# Patient Record
Sex: Male | Born: 1955 | Race: White | Hispanic: No | Smoking: Former smoker
Health system: Southern US, Community
[De-identification: ages and names within clinical notes are randomized; demographics above are authoritative.]

## PROBLEM LIST (undated history)

## (undated) DIAGNOSIS — R809 Proteinuria, unspecified: Secondary | ICD-10-CM

## (undated) DIAGNOSIS — M1990 Unspecified osteoarthritis, unspecified site: Secondary | ICD-10-CM

## (undated) DIAGNOSIS — I4891 Unspecified atrial fibrillation: Secondary | ICD-10-CM

## (undated) DIAGNOSIS — L0291 Cutaneous abscess, unspecified: Secondary | ICD-10-CM

## (undated) DIAGNOSIS — G4730 Sleep apnea, unspecified: Secondary | ICD-10-CM

## (undated) DIAGNOSIS — I10 Essential (primary) hypertension: Secondary | ICD-10-CM

## (undated) HISTORY — PX: LAPAROTOMY: SHX154

## (undated) HISTORY — PX: COLONOSCOPY: SHX174

## (undated) HISTORY — PX: IR PATIENT EVAL TECH 0-60 MINS: IMG5564

## (undated) HISTORY — PX: REPLACEMENT TOTAL HIP W/  RESURFACING IMPLANTS: SUR1222

## (undated) HISTORY — DX: Essential (primary) hypertension: I10

## (undated) HISTORY — PX: CYSTOSCOPY WITH STENT PLACEMENT: SHX5790

## (undated) HISTORY — DX: Sleep apnea, unspecified: G47.30

## (undated) HISTORY — DX: Proteinuria, unspecified: R80.9

## (undated) HISTORY — DX: Unspecified atrial fibrillation: I48.91

## (undated) HISTORY — DX: Cutaneous abscess, unspecified: L02.91

## (undated) HISTORY — DX: Unspecified osteoarthritis, unspecified site: M19.90

---

## 1997-09-13 ENCOUNTER — Ambulatory Visit (HOSPITAL_COMMUNITY): Admission: RE | Admit: 1997-09-13 | Discharge: 1997-09-13 | Payer: Self-pay | Admitting: *Deleted

## 2001-02-09 ENCOUNTER — Encounter: Admission: RE | Admit: 2001-02-09 | Discharge: 2001-02-09 | Payer: Self-pay | Admitting: Nephrology

## 2001-02-09 ENCOUNTER — Encounter: Payer: Self-pay | Admitting: Nephrology

## 2003-10-19 ENCOUNTER — Ambulatory Visit (HOSPITAL_BASED_OUTPATIENT_CLINIC_OR_DEPARTMENT_OTHER): Admission: RE | Admit: 2003-10-19 | Discharge: 2003-10-19 | Payer: Self-pay | Admitting: Family Medicine

## 2003-12-31 ENCOUNTER — Encounter: Admission: RE | Admit: 2003-12-31 | Discharge: 2003-12-31 | Payer: Self-pay | Admitting: Family Medicine

## 2004-08-09 ENCOUNTER — Emergency Department (HOSPITAL_COMMUNITY): Admission: EM | Admit: 2004-08-09 | Discharge: 2004-08-09 | Payer: Self-pay | Admitting: Emergency Medicine

## 2007-08-28 ENCOUNTER — Encounter: Admission: RE | Admit: 2007-08-28 | Discharge: 2007-08-28 | Payer: Self-pay | Admitting: Family Medicine

## 2011-02-19 ENCOUNTER — Emergency Department (HOSPITAL_COMMUNITY)
Admission: EM | Admit: 2011-02-19 | Discharge: 2011-02-20 | Disposition: A | Discharge status: Home / Self Care | Payer: BC Managed Care – PPO | Attending: Emergency Medicine | Admitting: Emergency Medicine

## 2011-02-19 DIAGNOSIS — W2203XA Walked into furniture, initial encounter: Secondary | ICD-10-CM | POA: Insufficient documentation

## 2011-02-19 DIAGNOSIS — S70259A Superficial foreign body, unspecified hip, initial encounter: Secondary | ICD-10-CM | POA: Insufficient documentation

## 2011-03-02 ENCOUNTER — Encounter: Payer: Self-pay | Admitting: Internal Medicine

## 2011-03-21 ENCOUNTER — Ambulatory Visit (AMBULATORY_SURGERY_CENTER): Payer: BC Managed Care – PPO

## 2011-03-21 ENCOUNTER — Encounter: Payer: Self-pay | Admitting: Internal Medicine

## 2011-03-21 VITALS — Ht 66.0 in | Wt 259.0 lb

## 2011-03-21 DIAGNOSIS — Z1211 Encounter for screening for malignant neoplasm of colon: Secondary | ICD-10-CM

## 2011-03-21 MED ORDER — PEG-KCL-NACL-NASULF-NA ASC-C 100 G PO SOLR: 1.0000 | Freq: Once | ORAL | Status: DC

## 2011-04-04 ENCOUNTER — Ambulatory Visit (AMBULATORY_SURGERY_CENTER): Payer: BC Managed Care – PPO | Admitting: Internal Medicine

## 2011-04-04 ENCOUNTER — Encounter: Payer: Self-pay | Admitting: Internal Medicine

## 2011-04-04 VITALS — BP 130/75 | HR 60 | Temp 97.8°F | Resp 16 | Ht 66.0 in | Wt 259.0 lb

## 2011-04-04 DIAGNOSIS — Z1211 Encounter for screening for malignant neoplasm of colon: Secondary | ICD-10-CM

## 2011-04-04 DIAGNOSIS — D126 Benign neoplasm of colon, unspecified: Secondary | ICD-10-CM

## 2011-04-04 LAB — HM COLONOSCOPY

## 2011-04-04 MED ORDER — SODIUM CHLORIDE 0.9 % IV SOLN: 500.0000 mL | INTRAVENOUS | Status: DC

## 2011-04-04 NOTE — Progress Notes (Signed)
Pressure applied to abdomen to reach cecum.  

## 2011-04-04 NOTE — Patient Instructions (Signed)
Please review discharge instructions (blue and green sheets)  Await pathology  Resume normal medications  Please review information about polyps, hemorrhoids, and high fiber diets

## 2011-04-05 ENCOUNTER — Telehealth: Payer: Self-pay | Admitting: *Deleted

## 2011-04-05 NOTE — Telephone Encounter (Signed)
Home number provided is a number for a Mental Health Provider.

## 2011-04-11 ENCOUNTER — Encounter: Payer: Self-pay | Admitting: *Deleted

## 2014-01-23 ENCOUNTER — Encounter: Payer: Self-pay | Admitting: Internal Medicine

## 2014-08-06 ENCOUNTER — Encounter: Payer: Self-pay | Admitting: Internal Medicine

## 2016-09-08 ENCOUNTER — Other Ambulatory Visit (HOSPITAL_COMMUNITY): Payer: Self-pay | Admitting: Nephrology

## 2016-09-08 DIAGNOSIS — N049 Nephrotic syndrome with unspecified morphologic changes: Secondary | ICD-10-CM

## 2016-09-18 ENCOUNTER — Other Ambulatory Visit: Payer: Self-pay | Admitting: Radiology

## 2016-09-19 ENCOUNTER — Other Ambulatory Visit: Payer: Self-pay | Admitting: Radiology

## 2016-09-20 ENCOUNTER — Ambulatory Visit (HOSPITAL_COMMUNITY)
Admission: RE | Admit: 2016-09-20 | Discharge: 2016-09-20 | Disposition: A | Discharge status: Home / Self Care | Payer: 59 | Source: Ambulatory Visit | Attending: Nephrology | Admitting: Nephrology

## 2016-09-20 ENCOUNTER — Ambulatory Visit (HOSPITAL_COMMUNITY): Payer: Self-pay

## 2016-09-20 ENCOUNTER — Encounter (HOSPITAL_COMMUNITY): Payer: Self-pay

## 2016-09-20 DIAGNOSIS — I129 Hypertensive chronic kidney disease with stage 1 through stage 4 chronic kidney disease, or unspecified chronic kidney disease: Secondary | ICD-10-CM | POA: Diagnosis not present

## 2016-09-20 DIAGNOSIS — N182 Chronic kidney disease, stage 2 (mild): Secondary | ICD-10-CM | POA: Diagnosis not present

## 2016-09-20 DIAGNOSIS — N049 Nephrotic syndrome with unspecified morphologic changes: Secondary | ICD-10-CM

## 2016-09-20 DIAGNOSIS — F17210 Nicotine dependence, cigarettes, uncomplicated: Secondary | ICD-10-CM | POA: Diagnosis not present

## 2016-09-20 DIAGNOSIS — M1990 Unspecified osteoarthritis, unspecified site: Secondary | ICD-10-CM | POA: Diagnosis not present

## 2016-09-20 DIAGNOSIS — R809 Proteinuria, unspecified: Secondary | ICD-10-CM | POA: Diagnosis present

## 2016-09-20 LAB — BASIC METABOLIC PANEL
ANION GAP: 10 (ref 5–15)
BUN: 9 mg/dL (ref 6–20)
CHLORIDE: 103 mmol/L (ref 101–111)
CO2: 23 mmol/L (ref 22–32)
Calcium: 9.6 mg/dL (ref 8.9–10.3)
Creatinine, Ser: 1.08 mg/dL (ref 0.61–1.24)
GFR calc non Af Amer: >60 mL/min (ref >60)
GLUCOSE: 97 mg/dL (ref 65–99)
Potassium: 4 mmol/L (ref 3.5–5.1)
Sodium: 136 mmol/L (ref 135–145)

## 2016-09-20 LAB — CBC WITH DIFFERENTIAL/PLATELET
Basophils Absolute: 0 10*3/uL (ref 0.0–0.1)
Basophils Relative: 1 %
Eosinophils Absolute: 0.7 10*3/uL (ref 0.0–0.7)
Eosinophils Relative: 9 %
HEMATOCRIT: 47.7 % (ref 39.0–52.0)
HEMOGLOBIN: 16.3 g/dL (ref 13.0–17.0)
LYMPHS ABS: 2.3 10*3/uL (ref 0.7–4.0)
Lymphocytes Relative: 29 %
MCH: 32.8 pg (ref 26.0–34.0)
MCHC: 34.2 g/dL (ref 30.0–36.0)
MCV: 96 fL (ref 78.0–100.0)
MONO ABS: 0.6 10*3/uL (ref 0.1–1.0)
MONOS PCT: 8 %
NEUTROS PCT: 53 %
Neutro Abs: 4 10*3/uL (ref 1.7–7.7)
Platelets: 178 10*3/uL (ref 150–400)
RBC: 4.97 MIL/uL (ref 4.22–5.81)
RDW: 12.7 % (ref 11.5–15.5)
WBC: 7.7 10*3/uL (ref 4.0–10.5)

## 2016-09-20 LAB — PROTIME-INR
INR: 1.08
Prothrombin Time: 14 seconds (ref 11.4–15.2)

## 2016-09-20 MED ORDER — FENTANYL CITRATE (PF) 100 MCG/2ML IJ SOLN
INTRAMUSCULAR | Status: AC
  Filled 2016-09-20: qty 2

## 2016-09-20 MED ORDER — LIDOCAINE HCL 1 % IJ SOLN
INTRAMUSCULAR | Status: AC
  Filled 2016-09-20: qty 20

## 2016-09-20 MED ORDER — GELATIN ABSORBABLE 12-7 MM EX MISC
CUTANEOUS | Status: AC
  Filled 2016-09-20: qty 1

## 2016-09-20 MED ORDER — FENTANYL CITRATE (PF) 100 MCG/2ML IJ SOLN
INTRAMUSCULAR | Status: AC | PRN
  Administered 2016-09-20 (×2): 50 ug via INTRAVENOUS

## 2016-09-20 MED ORDER — MIDAZOLAM HCL 2 MG/2ML IJ SOLN
INTRAMUSCULAR | Status: AC
  Filled 2016-09-20: qty 2

## 2016-09-20 MED ORDER — MIDAZOLAM HCL 2 MG/2ML IJ SOLN
INTRAMUSCULAR | Status: AC | PRN
  Administered 2016-09-20 (×2): 1 mg via INTRAVENOUS

## 2016-09-20 MED ORDER — SODIUM CHLORIDE 0.9 % IV SOLN
INTRAVENOUS | Status: DC
  Administered 2016-09-20: 12:00:00 via INTRAVENOUS

## 2016-09-20 NOTE — Procedures (Signed)
Radnom renal Bx 16 g core times two EBL 0 Comp 0

## 2016-09-20 NOTE — Discharge Instructions (Signed)
Percutaneous Kidney Biopsy, Care After °This sheet gives you information about how to care for yourself after your procedure. Your health care provider may also give you more specific instructions. If you have problems or questions, contact your health care provider. °What can I expect after the procedure? °After the procedure, it is common to have: °· Pain or soreness near the area where the needle went through your skin (biopsy site). °· Bright pink or cloudy urine for 24 hours after the procedure. °Follow these instructions at home: °Activity  °· Return to your normal activities as told by your health care provider. Ask your health care provider what activities are safe for you. °· Do not drive for 24 hours if you were given a medicine to help you relax (sedative). °· Do not lift anything that is heavier than 10 lb (4.5 kg) until your health care provider tells you that it is safe. °· Avoid activities that take a lot of effort (are strenuous) until your health care provider approves. Most people will have to wait 2 weeks before returning to activities such as exercise or sexual intercourse. °General instructions  °· Take over-the-counter and prescription medicines only as told by your health care provider. °· You may eat and drink after your procedure. Follow instructions from your health care provider about eating or drinking restrictions. °· Check your biopsy site every day for signs of infection. Check for: °¨ More redness, swelling, or pain. °¨ More fluid or blood. °¨ Warmth. °¨ Pus or a bad smell. °· Keep all follow-up visits as told by your health care provider. This is important. °Contact a health care provider if: °· You have more redness, swelling, or pain around your biopsy site. °· You have more fluid or blood coming from your biopsy site. °· Your biopsy site feels warm to the touch. °· You have pus or a bad smell coming from your biopsy site. °· You have blood in your urine more than 24 hours after  your procedure. °Get help right away if: °· You have dark red or brown urine. °· You have a fever. °· You are unable to urinate. °· You feel burning when you urinate. °· You feel faint. °· You have severe pain in your abdomen or side. °This information is not intended to replace advice given to you by your health care provider. Make sure you discuss any questions you have with your health care provider. °Document Released: 01/23/2013 Document Revised: 03/04/2016 Document Reviewed: 03/04/2016 °Elsevier Interactive Patient Education © 2017 [employer] ° °

## 2016-09-20 NOTE — H&P (Signed)
Chief Complaint: proteinuria  Referring Physician:Dr. Donato Larson  Supervising Physician: Robert Larson  Patient Status: [facility] - Out-pt  HPI: Robert Larson is an 61 y.o. male with a long-standing history of CKD Stage 2.  He is followed by Dr. Marval Larson.  He has had protein in his urine for a while, but recently has increased.  A random renal biopsy has now been requested.  He presents today for this procedures with no other medical complaints.  He took his BP meds this morning.    Past Medical History:  Past Medical History:  Diagnosis Date  . Osteoarthritis   . Protein in urine     Past Surgical History:  Past Surgical History:  Procedure Laterality Date  . 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 Colon cancer Neg Hx     Social History:  reports that he has been smoking Cigarettes.  He has been smoking about 1.00 pack per day. He does not have any smokeless tobacco history on file. His alcohol and drug histories are not on file.  Allergies: No Known Allergies  Medications: Medications reviewed in epic  Please HPI for pertinent positives, otherwise complete 10 system ROS negative.  Mallampati Score: MD Evaluation Airway: WNL Heart: WNL Abdomen: WNL Chest/ Lungs: WNL ASA  Classification: 2 Mallampati/Airway Score: Two  Physical Exam: BP 129/86   Pulse 64   Temp 97.9 F (36.6 C)   Resp 18   Ht 5\' 7"  (1.702 m)   Wt 259 lb (117.5 kg)   SpO2 98%   BMI 40.57 kg/m  Body mass index is 40.57 kg/m. General: pleasant, obese white male who is laying in bed in NAD HEENT: head is normocephalic, atraumatic.  Sclera are noninjected.  PERRL.  Ears and nose without any masses or lesions.  Mouth is pink and moist Heart: regular, rate, and rhythm.  Normal s1,s2. No obvious murmurs, gallops, or rubs noted.  Palpable radial and pedal pulses bilaterally Lungs: CTAB, no wheezes,  rhonchi, or rales noted.  Respiratory effort nonlabored Abd: soft, NT, obese, +BS, no masses, hernias, or organomegaly Psych: A&Ox3 with an appropriate affect.   Labs: Results for orders placed or performed during the hospital encounter of 09/20/16 (from the past 48 hour(s))  CBC with Differential/Platelet     Status: None   Collection Time: 09/20/16 11:54 AM  Result Value Ref Range   WBC 7.7 4.0 - 10.5 K/uL   RBC 4.97 4.22 - 5.81 MIL/uL   Hemoglobin 16.3 13.0 - 17.0 g/dL   HCT 47.7 39.0 - 52.0 %   MCV 96.0 78.0 - 100.0 fL   MCH 32.8 26.0 - 34.0 pg   MCHC 34.2 30.0 - 36.0 g/dL   RDW 12.7 11.5 - 15.5 %   Platelets 178 150 - 400 K/uL   Neutrophils Relative % 53 %   Neutro Abs 4.0 1.7 - 7.7 K/uL   Lymphocytes Relative 29 %   Lymphs Abs 2.3 0.7 - 4.0 K/uL   Monocytes Relative 8 %   Monocytes Absolute 0.6 0.1 - 1.0 K/uL   Eosinophils Relative 9 %   Eosinophils Absolute 0.7 0.0 - 0.7 K/uL   Basophils Relative 1 %   Basophils Absolute 0.0 0.0 - 0.1 K/uL    Imaging: No results found.  Assessment/Plan 1. Proteinuria, CKD Stage 2 We will plan to proceed with a random  renal biopsy.  His first BP was elevated, but his cuff was too small.  Once an appropriate sized cuff was placed his BP was noted to be 129/86.   Labs have been reviewed -Risks and Benefits discussed with the patient including, but not limited to bleeding, infection, damage to adjacent structures or low yield requiring additional tests. All of the patient's questions were answered, patient is agreeable to proceed. Consent signed and in chart.  Thank you for this interesting consult.  I greatly enjoyed meeting Robert Larson and look forward to participating in their care.  A copy of this report was sent to the requesting provider on this date.  Electronically Signed: Henreitta Larson 09/20/2016, 12:27 PM   I spent a total of  30 Minutes   in face to face in clinical consultation, greater than 50% of which was  counseling/coordinating care for proteinuria

## 2016-09-29 ENCOUNTER — Encounter (HOSPITAL_COMMUNITY): Payer: Self-pay

## 2016-10-04 ENCOUNTER — Encounter (HOSPITAL_COMMUNITY): Payer: Self-pay

## 2016-11-21 ENCOUNTER — Ambulatory Visit (INDEPENDENT_AMBULATORY_CARE_PROVIDER_SITE_OTHER): Payer: 59 | Admitting: Otolaryngology

## 2017-01-27 ENCOUNTER — Emergency Department (HOSPITAL_COMMUNITY): Payer: 59

## 2017-01-27 ENCOUNTER — Encounter (HOSPITAL_COMMUNITY): Payer: Self-pay | Admitting: Emergency Medicine

## 2017-01-27 ENCOUNTER — Emergency Department (HOSPITAL_COMMUNITY)
Admission: EM | Admit: 2017-01-27 | Discharge: 2017-01-27 | Disposition: A | Discharge status: Home / Self Care | Payer: 59 | Attending: Emergency Medicine | Admitting: Emergency Medicine

## 2017-01-27 DIAGNOSIS — Z7982 Long term (current) use of aspirin: Secondary | ICD-10-CM | POA: Diagnosis not present

## 2017-01-27 DIAGNOSIS — F17210 Nicotine dependence, cigarettes, uncomplicated: Secondary | ICD-10-CM | POA: Insufficient documentation

## 2017-01-27 DIAGNOSIS — Z96642 Presence of left artificial hip joint: Secondary | ICD-10-CM | POA: Diagnosis not present

## 2017-01-27 DIAGNOSIS — M25552 Pain in left hip: Secondary | ICD-10-CM | POA: Diagnosis not present

## 2017-01-27 MED ORDER — DIAZEPAM 5 MG PO TABS
5.0000 mg | ORAL_TABLET | Freq: Once | ORAL | Status: AC
  Administered 2017-01-27: 5 mg via ORAL
  Filled 2017-01-27: qty 1

## 2017-01-27 MED ORDER — METHOCARBAMOL 750 MG PO TABS: 750.0000 mg | ORAL_TABLET | Freq: Four times a day (QID) | ORAL | 0 refills | Status: DC

## 2017-01-27 MED ORDER — OXYCODONE-ACETAMINOPHEN 5-325 MG PO TABS: 2.0000 | ORAL_TABLET | ORAL | 0 refills | Status: DC | PRN

## 2017-01-27 MED ORDER — OXYCODONE-ACETAMINOPHEN 5-325 MG PO TABS
2.0000 | ORAL_TABLET | Freq: Once | ORAL | Status: AC
  Administered 2017-01-27: 2 via ORAL
  Filled 2017-01-27: qty 2

## 2017-01-27 NOTE — ED Provider Notes (Signed)
[facility] DEPT Provider Note   CSN: [id number] Arrival date & time: 01/27/17  0913     History   Chief Complaint Chief Complaint  Patient presents with  . Hip Pain  . Leg Pain    left    HPI Robert Larson is a 61 y.o. male.  61 year old male presents withseveral-day history of left lateral hip pain that began after he struck it against an object. Does have a history of left hip replacement. Denies any weakness to his left foot. No back pain. Pain is sharp and worse with movements and better with remaining still. No treatment use prior to arrival.      Past Medical History:  Diagnosis Date  . Osteoarthritis   . Protein in urine     There are no active problems to display for this patient.   Past Surgical History:  Procedure Laterality Date  . REPLACEMENT TOTAL HIP W/  RESURFACING IMPLANTS     Left        Home Medications    Prior to Admission medications   Medication Sig Start Date End Date Taking? Authorizing Provider  aspirin 81 MG tablet Take 81 mg by mouth daily.      [provider]  telmisartan (MICARDIS) 80 MG tablet Take 80 mg by mouth daily.      [provider]  verapamil (CALAN) 120 MG tablet Take 120 mg by mouth daily.    [provider]    Family History Family History  Problem Relation Age of Onset  . Colon cancer Neg Hx     Social History Social History  Substance Use Topics  . Smoking status: Current Every Day Smoker    Packs/day: 1.00    Types: Cigarettes  . Smokeless tobacco: Never Used  . Alcohol use No     Allergies   Patient has no known allergies.   Review of Systems Review of Systems  All other systems reviewed and are negative.    Physical Exam Updated Vital Signs BP (!) 170/88 (BP Location: Left Arm)   Pulse (!) 59   Temp 98.2 F (36.8 C) (Oral)   Resp 19   Ht 1.702 m (5\' 7" )   Wt 113.4 kg (250 lb)   SpO2 96%   BMI 39.16 kg/m   Physical Exam  Constitutional: He is  oriented to person, place, and time. He appears well-developed and well-nourished.  Non-toxic appearance. No distress.  HENT:  Head: Normocephalic and atraumatic.  Eyes: Pupils are equal, round, and reactive to light. Conjunctivae, EOM and lids are normal.  Neck: Normal range of motion. Neck supple. No tracheal deviation present. No thyroid mass present.  Cardiovascular: Normal rate, regular rhythm and normal heart sounds.  Exam reveals no gallop.   No murmur heard. Pulmonary/Chest: Effort normal and breath sounds normal. No stridor. No respiratory distress. He has no decreased breath sounds. He has no wheezes. He has no rhonchi. He has no rales.  Abdominal: Soft. Normal appearance and bowel sounds are normal. He exhibits no distension. There is no tenderness. There is no rebound and no CVA tenderness.  Musculoskeletal: Normal range of motion. He exhibits no edema or tenderness.       Legs: Neurological: He is alert and oriented to person, place, and time. He has normal strength. No cranial nerve deficit or sensory deficit. GCS eye subscore is 4. GCS verbal subscore is 5. GCS motor subscore is 6.  Skin: Skin is warm and dry. No abrasion and  no rash noted.  Psychiatric: He has a normal mood and affect. His speech is normal and behavior is normal.  Nursing note and vitals reviewed.    ED Treatments / Results  Labs (all labs ordered are listed, but only abnormal results are displayed) Labs Reviewed - No data to display  EKG  EKG Interpretation None       Radiology No results found.  Procedures Procedures (including critical care time)  Medications Ordered in ED Medications  oxyCODONE-acetaminophen (PERCOCET/ROXICET) 5-325 MG per tablet 2 tablet (not administered)  diazepam (VALIUM) tablet 5 mg (not administered)     Initial Impression / Assessment and Plan / ED Course  I have reviewed the triage vital signs and the nursing notes.  Pertinent labs & imaging results that were  available during my care of the patient were reviewed by me and considered in my medical decision making (see chart for details).     Patient's pain treated here feels better. X-rays negative. Return precautions given.  Final Clinical Impressions(s) / ED Diagnoses   Final diagnoses:  None    New Prescriptions New Prescriptions   No medications on file     Lacretia Leigh, MD 01/27/17 1331

## 2017-01-27 NOTE — ED Notes (Signed)
Patient transported to X-ray 

## 2017-01-27 NOTE — ED Triage Notes (Signed)
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 Reports pain is constant throb, aching pain

## 2017-02-13 ENCOUNTER — Encounter (INDEPENDENT_AMBULATORY_CARE_PROVIDER_SITE_OTHER): Payer: Self-pay | Admitting: Orthopaedic Surgery

## 2017-02-13 ENCOUNTER — Ambulatory Visit (INDEPENDENT_AMBULATORY_CARE_PROVIDER_SITE_OTHER): Payer: 59 | Admitting: Orthopaedic Surgery

## 2017-02-13 VITALS — Ht 67.0 in | Wt 246.0 lb

## 2017-02-13 DIAGNOSIS — S7002XA Contusion of left hip, initial encounter: Secondary | ICD-10-CM

## 2017-02-13 DIAGNOSIS — S7012XA Contusion of left thigh, initial encounter: Secondary | ICD-10-CM

## 2017-02-13 HISTORY — DX: Contusion of left hip, initial encounter: S70.02XA

## 2017-02-13 MED ORDER — METHOCARBAMOL 750 MG PO TABS: 750.0000 mg | ORAL_TABLET | Freq: Three times a day (TID) | ORAL | 0 refills | Status: DC | PRN

## 2017-02-13 NOTE — Progress Notes (Signed)
Office Visit Note   Patient: Robert Larson           Date of Birth: 07/05/1955           MRN: [id number] Visit Date: 02/13/2017              Requested by: Donato Heinz, MD [address] [place], [place] [address] PCP: Donato Heinz, MD   Assessment & Plan: Visit Diagnoses:  1. Contusion of left hip and thigh, initial encounter     Plan: Since he is making improvements he'll follow up as needed. I'll have him still use his cane for balance and coordination. Still contained Aleve and I did refill his Robaxin. All questions were encouraged and answered.  Follow-Up Instructions: Return if symptoms worsen or fail to improve.   Orders:  No orders of the defined types were placed in this encounter.  Meds ordered this encounter  Medications  . methocarbamol (ROBAXIN-750) 750 MG tablet    Sig: Take 1 tablet (750 mg total) by mouth every 8 (eight) hours as needed for muscle spasms.    Dispense:  60 tablet    Refill:  0      Procedures: No procedures performed   Clinical Data: No additional findings.   Subjective: Chief Complaint  Patient presents with  . Left Hip - Pain  The patient comes in today evaluation of left hip pain. He had a total hip arthroplasty done about 7-8 years ago and with the femoral condyle. This was of his left hip. He had done well for long period time he was coming down some steps at his work at [employer] and sustained a muscle crushing type of injury to his left hip area. He's been ambulating with a cane and he did give the ER. He's been a muscle relaxant and Aleve and he said that is helped quite a bit. He said the pain is slowly easing down. He is being seen in follow-up as her for emergency room. He says he is slowly getting better he feels. He denies any groin pain. Denies any radicular symptoms going down past his knee to his foot. He points to his lateral thigh  HPI  Review of Systems He currently denies any headache, chest pain,  short of breath, fever, chills, nausea, vomiting.  Objective: Vital Signs: Ht 5\' 7"  (1.702 m)   Wt 246 lb (111.6 kg)   BMI 38.53 kg/m   Physical Exam He is alert and oriented 3 and in no acute distress Ortho Exam Examination of his left lower extremity shows normal range of motion thigh and knee with no bruising. There is no significant pain over the lateral aspect of his hip and hip is well located. He is neurovascularly intact. Specialty Comments:  No specialty comments available.  Imaging: No results found. X-rays on the canopy system are independently reviewed by me and show well-seated left total hip arthroplasty with no, getting features. There is no acute findings. The implant appears well seated.  PMFS History: Patient Active Problem List   Diagnosis Date Noted  . Contusion of left hip and thigh 02/13/2017   Past Medical History:  Diagnosis Date  . Osteoarthritis   . Protein in urine     Family History  Problem Relation Age of Onset  . Colon cancer Neg Hx     Past Surgical History:  Procedure Laterality Date  . REPLACEMENT TOTAL HIP W/  RESURFACING IMPLANTS     Left  Social History   Occupational History  . Not on file.   Social History Main Topics  . Smoking status: Current Every Day Smoker    Packs/day: 1.00    Types: Cigarettes  . Smokeless tobacco: Never Used  . Alcohol use No  . Drug use: Unknown  . Sexual activity: Not on file

## 2017-03-01 ENCOUNTER — Telehealth (INDEPENDENT_AMBULATORY_CARE_PROVIDER_SITE_OTHER): Payer: Self-pay | Admitting: Orthopaedic Surgery

## 2017-03-01 NOTE — Telephone Encounter (Signed)
Per daughter patient has been taking Muscle relaxer along with Aleve as instructed. Patient still walks with a cane. Patient also tried using a new pair of shoes, with no relief. Lt hip painful when weightbearing. Patient would like to know next step. What doctor recommends now. Please call daughter to discuss on cell # or [phone] ([place])

## 2017-03-01 NOTE — Telephone Encounter (Signed)
Please advise 

## 2017-03-01 NOTE — Telephone Encounter (Signed)
When I saw him we diagnosed him with a contusion to that hip due to what happened. He has a hip replacement on that side and when I saw him he was actually feeling slightly better. His hip replacement looked normal on x-ray and I felt this was a deep bone bruise and a contusion to the soft tissues that will take some time to heal. Again he was doing better at his last visit and we put as needed follow-up. If he is not doing well again we can certainly see him back in the office for repeat x-rays and then determine whether or not an MRI is warranted. Usually it does take time to heal a bone bruise as well as strain soft tissue from the contusion that he has. Find out what he would like [place] to then try next.

## 2017-03-02 NOTE — Telephone Encounter (Signed)
Can you pass this message along please? Let me know what they decide, thank you!

## 2017-03-03 NOTE — Telephone Encounter (Signed)
Patients daughter advised, and daughter will discuss with patient.

## 2017-03-13 ENCOUNTER — Ambulatory Visit (INDEPENDENT_AMBULATORY_CARE_PROVIDER_SITE_OTHER): Payer: 59 | Admitting: Orthopaedic Surgery

## 2017-03-13 ENCOUNTER — Ambulatory Visit (INDEPENDENT_AMBULATORY_CARE_PROVIDER_SITE_OTHER): Payer: 59

## 2017-03-13 ENCOUNTER — Encounter (INDEPENDENT_AMBULATORY_CARE_PROVIDER_SITE_OTHER): Payer: Self-pay | Admitting: Orthopaedic Surgery

## 2017-03-13 ENCOUNTER — Other Ambulatory Visit (INDEPENDENT_AMBULATORY_CARE_PROVIDER_SITE_OTHER): Payer: Self-pay

## 2017-03-13 DIAGNOSIS — M25552 Pain in left hip: Secondary | ICD-10-CM

## 2017-03-13 DIAGNOSIS — S7012XD Contusion of left thigh, subsequent encounter: Secondary | ICD-10-CM | POA: Diagnosis not present

## 2017-03-13 DIAGNOSIS — M5442 Lumbago with sciatica, left side: Principal | ICD-10-CM

## 2017-03-13 DIAGNOSIS — M5441 Lumbago with sciatica, right side: Secondary | ICD-10-CM

## 2017-03-13 DIAGNOSIS — S7002XD Contusion of left hip, subsequent encounter: Secondary | ICD-10-CM

## 2017-03-13 DIAGNOSIS — G8929 Other chronic pain: Secondary | ICD-10-CM

## 2017-03-13 HISTORY — DX: Lumbago with sciatica, left side: M54.41

## 2017-03-13 MED ORDER — METHYLPREDNISOLONE 4 MG PO TABS: ORAL_TABLET | ORAL | 0 refills | Status: DC

## 2017-03-13 NOTE — Progress Notes (Signed)
Patient is continuing to follow-up with a painful left hip which occurred after an accident and contusion to that hip. He had a previous total hip arthroplasty and when we saw him a month ago the pain was decreasing after his accident. We found the hip arthroplasty to be intact and more of his pain was lateral thigh pain. However he is coming in today because the pain is still persisting thus we will get new x-rays today as well. He has tried rest, ice, heat, anti-inflammatories and time to try to get his pain to subside. However he denies the pain is in his hip and I reports that to be low back going down the back side of both his legs. He is someone who is significantly obese. Walk with a cane as well.  On examination of his back there is no rash. He has pain in the paraspinal muscles to the right side of the thoracic and lumbar spine. He has bilateral paraspinal pain as well as aspect lumbar spine as well.  X-rays the hip show well-seated implant on the left side with no complicating features. His left hip exam shows no pain.  At this point we would like to send him to physical therapy to work on his low back with any types of modalities to decrease his pain and improve his function. Also send in a six-day steroid taper. We'll see him back in 2 weeks to see how is doing overall. If he still having pain at that point I would like to have 2 views of his lumbar spine x-rays obtained.

## 2017-03-23 ENCOUNTER — Telehealth (INDEPENDENT_AMBULATORY_CARE_PROVIDER_SITE_OTHER): Payer: Self-pay | Admitting: Orthopaedic Surgery

## 2017-03-23 NOTE — Telephone Encounter (Signed)
Dose pack helped hip pain, and now no longer walking with a cane. Pain is coming back. Is there anything else he can take to ward off impending pain. Patient does have a follow up scheduled with his pcp in November. Please advise.

## 2017-03-23 NOTE — Telephone Encounter (Signed)
Please advise 

## 2018-02-19 ENCOUNTER — Emergency Department (HOSPITAL_COMMUNITY)
Admission: EM | Admit: 2018-02-19 | Discharge: 2018-02-19 | Disposition: A | Discharge status: Home / Self Care | Payer: 59 | Attending: Emergency Medicine | Admitting: Emergency Medicine

## 2018-02-19 ENCOUNTER — Other Ambulatory Visit: Payer: Self-pay

## 2018-02-19 ENCOUNTER — Encounter (HOSPITAL_COMMUNITY): Payer: Self-pay

## 2018-02-19 DIAGNOSIS — K625 Hemorrhage of anus and rectum: Secondary | ICD-10-CM | POA: Insufficient documentation

## 2018-02-19 DIAGNOSIS — Z79899 Other long term (current) drug therapy: Secondary | ICD-10-CM | POA: Diagnosis not present

## 2018-02-19 DIAGNOSIS — F17210 Nicotine dependence, cigarettes, uncomplicated: Secondary | ICD-10-CM | POA: Diagnosis not present

## 2018-02-19 DIAGNOSIS — Z7982 Long term (current) use of aspirin: Secondary | ICD-10-CM | POA: Insufficient documentation

## 2018-02-19 LAB — COMPREHENSIVE METABOLIC PANEL
ALK PHOS: 52 U/L (ref 38–126)
ALT: 21 U/L (ref 0–44)
ANION GAP: 10 (ref 5–15)
AST: 27 U/L (ref 15–41)
Albumin: 3.2 g/dL — ABNORMAL LOW (ref 3.5–5.0)
BUN: 13 mg/dL (ref 6–20)
CALCIUM: 8.7 mg/dL — AB (ref 8.9–10.3)
CO2: 21 mmol/L — AB (ref 22–32)
Chloride: 108 mmol/L (ref 98–111)
Creatinine, Ser: 1.15 mg/dL (ref 0.61–1.24)
GFR calc non Af Amer: >60 mL/min (ref >60)
Glucose, Bld: 112 mg/dL — ABNORMAL HIGH (ref 70–99)
POTASSIUM: 4 mmol/L (ref 3.5–5.1)
Sodium: 139 mmol/L (ref 135–145)
TOTAL PROTEIN: 7 g/dL (ref 6.5–8.1)
Total Bilirubin: 0.7 mg/dL (ref 0.3–1.2)

## 2018-02-19 LAB — CBC
HCT: 42.9 % (ref 39.0–52.0)
HEMATOCRIT: 47.2 % (ref 39.0–52.0)
HEMOGLOBIN: 15.9 g/dL (ref 13.0–17.0)
Hemoglobin: 14.6 g/dL (ref 13.0–17.0)
MCH: 33.3 pg (ref 26.0–34.0)
MCH: 33.5 pg (ref 26.0–34.0)
MCHC: 33.7 g/dL (ref 30.0–36.0)
MCHC: 34 g/dL (ref 30.0–36.0)
MCV: 98.7 fL (ref 78.0–100.0)
MCV: 98.4 fL (ref 78.0–100.0)
PLATELETS: 188 10*3/uL (ref 150–400)
Platelets: 216 10*3/uL (ref 150–400)
RBC: 4.78 MIL/uL (ref 4.22–5.81)
RBC: 4.36 MIL/uL (ref 4.22–5.81)
RDW: 12.9 % (ref 11.5–15.5)
RDW: 12.8 % (ref 11.5–15.5)
WBC: 8.6 10*3/uL (ref 4.0–10.5)
WBC: 7.9 10*3/uL (ref 4.0–10.5)

## 2018-02-19 LAB — TYPE AND SCREEN
ABO/RH(D): O POS
ANTIBODY SCREEN: NEGATIVE

## 2018-02-19 LAB — ABO/RH: ABO/RH(D): O POS

## 2018-02-19 NOTE — ED Notes (Signed)
Provider did rectal exam. Blood noted in rectum. Provider does not need occult sample.

## 2018-02-19 NOTE — Discharge Instructions (Addendum)
Take 1 capful of miralax and 1 capsule colace daily. Follow a liquid diet until tomorrow's appointment. Return to ER immediately if any severe bleeding, abdominal pain, or feeling like you are short of breath or going to pass out.

## 2018-02-19 NOTE — ED Provider Notes (Signed)
[facility] DEPT Provider Note   CSN: [id number] Arrival date & time: 02/19/18  1005     History   Chief Complaint Chief Complaint  Patient presents with  . Rectal Bleeding    HPI Robert Larson is a 62 y.o. male.  62 year old male with past medical history including osteoarthritis who presents with rectal bleeding.  Yesterday evening the patient states that he was straining to have a BM and had a stringy stool with bright red blood. He went to bed and woke up in the night soaked in BRB, had apparently spontaneously bled during his sleep. This morning he had another stool with BRB and clots. He denies any associated rectal or abdominal pain.  He has had some mild straining recently but denies any history of severe constipation.  No associated nausea, vomiting, fevers, urinary symptoms, or history of GI bleeding.  He takes aspirin daily.  No NSAID use. Has seen [facility] previously.   The history is provided by the patient.  Rectal Bleeding    Past Medical History:  Diagnosis Date  . Osteoarthritis   . Protein in urine     Patient Active Problem List   Diagnosis Date Noted  . Acute right-sided low back pain with bilateral sciatica 03/13/2017  . Contusion of left hip and thigh 02/13/2017    Past Surgical History:  Procedure Laterality Date  . REPLACEMENT TOTAL HIP W/  RESURFACING IMPLANTS     Left         Home Medications    Prior to Admission medications   Medication Sig Start Date End Date Taking? Authorizing Provider  aspirin EC 81 MG tablet Take 81 mg by mouth at bedtime.   Yes [provider]  Omega-3 Fatty Acids (FISH OIL PO) Take 2 capsules by mouth daily.   Yes [provider]  telmisartan-hydrochlorothiazide (MICARDIS HCT) 80-12.5 MG tablet Take 1 tablet by mouth daily after lunch.  01/26/17  Yes [provider]  verapamil (VERELAN PM) 180 MG 24 hr capsule Take 180 mg by mouth daily. 12/25/16  Yes  [provider]  methocarbamol (ROBAXIN-750) 750 MG tablet Take 1 tablet (750 mg total) by mouth every 8 (eight) hours as needed for muscle spasms. Patient not taking: Reported on 02/19/2018 02/13/17   Mcarthur Rossetti, MD  methylPREDNISolone (MEDROL) 4 MG tablet Medrol dose pack. Take as instructed Patient not taking: Reported on 02/19/2018 03/13/17   Mcarthur Rossetti, MD  naproxen sodium (ANAPROX) 220 MG tablet Take 660 mg by mouth every 12 (twelve) hours as needed (for pain).    [provider]  oxyCODONE-acetaminophen (PERCOCET/ROXICET) 5-325 MG tablet Take 2 tablets by mouth every 4 (four) hours as needed for severe pain. Patient not taking: Reported on 02/19/2018 01/27/17   Lacretia Leigh, MD    Family History Family History  Problem Relation Age of Onset  . Cancer Mother   . Cancer Father   . Colon cancer Neg Hx     Social History Social History   Tobacco Use  . Smoking status: Current Every Day Smoker    Packs/day: 1.00    Types: Cigarettes  . Smokeless tobacco: Never Used  Substance Use Topics  . Alcohol use: No  . Drug use: Never     Allergies   Patient has no known allergies.   Review of Systems Review of Systems  Gastrointestinal: Positive for hematochezia.   All other systems reviewed and are negative except that which was mentioned  in HPI   Physical Exam Updated Vital Signs BP (!) 164/79   Pulse (!) 52   Temp 99 F (37.2 C) (Oral)   Resp 18   Ht 5\' 7"  (1.702 m)   Wt 117.9 kg   SpO2 96%   BMI 40.72 kg/m   Physical Exam  Constitutional: He is oriented to person, place, and time. He appears well-developed and well-nourished. No distress.  HENT:  Head: Normocephalic and atraumatic.  Moist mucous membranes  Eyes: Pupils are equal, round, and reactive to light. Conjunctivae are normal.  Neck: Neck supple.  Cardiovascular: Normal rate, regular rhythm and normal heart sounds.  No murmur heard. Pulmonary/Chest: Effort  normal.  Occasional scattered wheezes b/l  Abdominal: Soft. Bowel sounds are normal. He exhibits no distension. There is no tenderness.  Genitourinary:  Genitourinary Comments: Dried blood on buttocks and perineum, no large external hemorrhoid or anal fissures  Musculoskeletal: He exhibits no edema.  Neurological: He is alert and oriented to person, place, and time.  Fluent speech  Skin: Skin is warm and dry.  Psychiatric: He has a normal mood and affect. Judgment normal.  Nursing note and vitals reviewed. Chaperone was present during exam.    ED Treatments / Results  Labs (all labs ordered are listed, but only abnormal results are displayed) Labs Reviewed  COMPREHENSIVE METABOLIC PANEL - Abnormal; Notable for the following components:      Result Value   CO2 21 (*)    Glucose, Bld 112 (*)    Calcium 8.7 (*)    Albumin 3.2 (*)    All other components within normal limits  CBC  CBC  TYPE AND SCREEN  ABO/RH    EKG None  Radiology No results found.  Procedures Procedures (including critical care time)  Medications Ordered in ED Medications - No data to display   Initial Impression / Assessment and Plan / ED Course  I have reviewed the triage vital signs and the nursing notes.  Pertinent labs that were available during my care of the patient were reviewed by me and considered in my medical decision making (see chart for details).     Pt had evidence of BRB on exam, no obvious external hemorrhoids. VS notable for HTN. Pt denies abd pain to suggest diverticulitis and given BRB I doubt upper bleed from PUD.  Initial Hgb 15.9. Discussed w/ [facility] PA on call, who reviewed previous endoscopy notes which showed h/o internal hemorrhoids and polyps. His h/o straining followed by painless BRB suggests internal hemorrhoid. His repeat Hgb 3 hours later is still reassuring at 14.6. Both GI and I agreed with plan to have patient f/u in clinic tomorrow at 2:15. I have instructed  pt on liquid diet, initiating colace and miralax, and avoiding NSAIDs pending GI evaluation. Extensively reviewed return precautions regarding worsening sx and he voiced understanding.   Final Clinical Impressions(s) / ED Diagnoses   Final diagnoses:  Rectal bleeding    ED Discharge Orders    None       Ahmaya Ostermiller, Wenda Overland, MD 02/19/18 2052

## 2018-02-19 NOTE — ED Triage Notes (Signed)
Patient states he was having a BM this AM and had bright red rectal bleeding with clots.

## 2018-02-20 ENCOUNTER — Ambulatory Visit (INDEPENDENT_AMBULATORY_CARE_PROVIDER_SITE_OTHER): Payer: 59 | Admitting: Physician Assistant

## 2018-02-20 ENCOUNTER — Encounter: Payer: Self-pay | Admitting: Physician Assistant

## 2018-02-20 VITALS — BP 146/82 | HR 88 | Ht 67.0 in | Wt 285.0 lb

## 2018-02-20 DIAGNOSIS — Z86010 Personal history of colonic polyps: Secondary | ICD-10-CM | POA: Diagnosis not present

## 2018-02-20 DIAGNOSIS — K625 Hemorrhage of anus and rectum: Secondary | ICD-10-CM

## 2018-02-20 MED ORDER — NA SULFATE-K SULFATE-MG SULF 17.5-3.13-1.6 GM/177ML PO SOLN: 1.0000 | ORAL | 0 refills | Status: DC

## 2018-02-20 NOTE — Progress Notes (Signed)
Assessment and plans reviewed  

## 2018-02-20 NOTE — Progress Notes (Signed)
Chief Complaint: Rectal bleeding  HPI:   Robert Larson is a 62 year old male, known to Dr. Henrene Pastor, with a past medical history as listed below, who was referred to me by Donato Heinz, MD for follow-up after being seen in the ER for rectal bleeding.     04/04/2011 colonoscopy showed 6 polyps were removed from the ascending colon to sigmoid colon, internal hemorrhoids and otherwise normal.  Polyps were adenomatous.  Repeat was recommended in 3 years.    02/19/2018 seen in the ED for rectal bleeding.  Described that this started on the night of 02/18/2018.Patient had a rectal exam which showed dried blood on the buttocks and perineum and no large external hemorrhoid or anal fissures.  CBC x2 was normal.  First hemoglobin was 15.9, repeat 3 hours later was 14.6.  Patient was told to start a liquid diet and Colace as well as MiraLAX.    Today, describes that for the past month he has been trying to abide by a healthier diet with increased fiber and has lost around 8 pounds.  Tells me that on 02/18/2018 he had to drive back from his house in [place] and that that day ate a large salad and noticed that his stools were somewhat stringy and hard to pass and found himself straining that night a lot.  He passed 2-3 of these stringy bowel movements and went to sleep, upon waking up around 4 AM he did see some bright red blood in his underwear and went and took an Epsom salt bath but had no associated abdominal pain or rectal pain.  He then went into his office for work and had another bowel movement for which he had to strain and did see some bright red blood a little bit larger amount with some clots.  He then proceeded to the ER as above and since then had one further bowel movement this morning which was more normal and only had a very scant amount of bright red blood.  Patient has started Colace and MiraLAX as of yesterday and tells me that the stool this morning was easier to pass.  Denies similar episodes in the  past.     Denies fever, chills, anorexia, nausea, vomiting, heartburn, reflux, abdominal or rectal pain.  Past Medical History:  Diagnosis Date  . Osteoarthritis   . Protein in urine     Past Surgical History:  Procedure Laterality Date  . REPLACEMENT TOTAL HIP W/  RESURFACING IMPLANTS     Left     Current Outpatient Medications  Medication Sig Dispense Refill  . aspirin EC 81 MG tablet Take 81 mg by mouth at bedtime.    . methocarbamol (ROBAXIN-750) 750 MG tablet Take 1 tablet (750 mg total) by mouth every 8 (eight) hours as needed for muscle spasms. (Patient not taking: Reported on 02/19/2018) 60 tablet 0  . methylPREDNISolone (MEDROL) 4 MG tablet Medrol dose pack. Take as instructed (Patient not taking: Reported on 02/19/2018) 21 tablet 0  . naproxen sodium (ANAPROX) 220 MG tablet Take 660 mg by mouth every 12 (twelve) hours as needed (for pain).    . Omega-3 Fatty Acids (FISH OIL PO) Take 2 capsules by mouth daily.    Marland Kitchen oxyCODONE-acetaminophen (PERCOCET/ROXICET) 5-325 MG tablet Take 2 tablets by mouth every 4 (four) hours as needed for severe pain. (Patient not taking: Reported on 02/19/2018) 15 tablet 0  . telmisartan-hydrochlorothiazide (MICARDIS HCT) 80-12.5 MG tablet Take 1 tablet by mouth daily after lunch.     Marland Kitchen  verapamil (VERELAN PM) 180 MG 24 hr capsule Take 180 mg by mouth daily.  3   No current facility-administered medications for this visit.     Allergies as of 02/20/2018  . (No Known Allergies)    Family History  Problem Relation Age of Onset  . Cancer Mother   . Cancer Father   . Colon cancer Neg Hx     Social History   Socioeconomic History  . Marital status: Married    Spouse name: Not on file  . Number of children: Not on file  . Years of education: Not on file  . Highest education level: Not on file  Occupational History  . Not on file  Social Needs  . Financial resource strain: Not on file  . Food insecurity:    Worry: Not on file     Inability: Not on file  . Transportation needs:    Medical: Not on file    Non-medical: Not on file  Tobacco Use  . Smoking status: Current Every Day Smoker    Packs/day: 1.00    Types: Cigarettes  . Smokeless tobacco: Never Used  Substance and Sexual Activity  . Alcohol use: No  . Drug use: Never  . Sexual activity: Not on file  Lifestyle  . Physical activity:    Days per week: Not on file    Minutes per session: Not on file  . Stress: Not on file  Relationships  . Social connections:    Talks on phone: Not on file    Gets together: Not on file    Attends religious service: Not on file    Active member of club or organization: Not on file    Attends meetings of clubs or organizations: Not on file    Relationship status: Not on file  . Intimate partner violence:    Fear of current or ex partner: Not on file    Emotionally abused: Not on file    Physically abused: Not on file    Forced sexual activity: Not on file  Other Topics Concern  . Not on file  Social History Narrative  . Not on file    Review of Systems:    Constitutional: No weight loss, fever or chills Skin: No rash  Cardiovascular: No chest pain Respiratory: No SOB  Gastrointestinal: See HPI and otherwise negative Genitourinary: No dysuria Neurological: No headache, dizziness or syncope Musculoskeletal: No new muscle or joint pain Hematologic: No bruising Psychiatric: No history of depression or anxiety   Physical Exam:  Vital signs: BP (!) 146/82   Pulse 88   Ht 5\' 7"  (1.702 m)   Wt 285 lb (129.3 kg)   BMI 44.64 kg/m   Constitutional:   Pleasant obese Caucasian male appears to be in NAD, Well developed, Well nourished, alert and cooperative Head:  Normocephalic and atraumatic. Eyes:   PEERL, EOMI. No icterus. Conjunctiva pink. Ears:  Normal auditory acuity. Neck:  Supple Throat: Oral cavity and pharynx without inflammation, swelling or lesion.  Respiratory: Respirations even and unlabored.  Lungs clear to auscultation bilaterally.   No wheezes, crackles, or rhonchi.  Cardiovascular: Normal S1, S2. No MRG. Regular rate and rhythm. No peripheral edema, cyanosis or pallor.  Gastrointestinal:  Soft, nondistended, nontender. No rebound or guarding. Normal bowel sounds. No appreciable masses or hepatomegaly. Rectal:  Patient declined Msk:  Symmetrical without gross deformities. Without edema, no deformity or joint abnormality.  Neurologic:  Alert and  oriented x4;  grossly normal neurologically.  Skin:   Dry and intact without significant lesions or rashes. Psychiatric: Demonstrates good judgement and reason without abnormal affect or behaviors.  RELEVANT LABS AND IMAGING: CBC    Component Value Date/Time   WBC 7.9 02/19/2018 1552   RBC 4.36 02/19/2018 1552   HGB 14.6 02/19/2018 1552   HCT 42.9 02/19/2018 1552   PLT 188 02/19/2018 1552   MCV 98.4 02/19/2018 1552   MCH 33.5 02/19/2018 1552   MCHC 34.0 02/19/2018 1552   RDW 12.8 02/19/2018 1552   LYMPHSABS 2.3 09/20/2016 1154   MONOABS 0.6 09/20/2016 1154   EOSABS 0.7 09/20/2016 1154   BASOSABS 0.0 09/20/2016 1154    CMP     Component Value Date/Time   NA 139 02/19/2018 1123   K 4.0 02/19/2018 1123   CL 108 02/19/2018 1123   CO2 21 (L) 02/19/2018 1123   GLUCOSE 112 (H) 02/19/2018 1123   BUN 13 02/19/2018 1123   CREATININE 1.15 02/19/2018 1123   CALCIUM 8.7 (L) 02/19/2018 1123   PROT 7.0 02/19/2018 1123   ALBUMIN 3.2 (L) 02/19/2018 1123   AST 27 02/19/2018 1123   ALT 21 02/19/2018 1123   ALKPHOS 52 02/19/2018 1123   BILITOT 0.7 02/19/2018 1123   GFRNONAA >60 02/19/2018 1123   GFRAA >60 02/19/2018 1123    Assessment: 1.  Rectal bleeding: 3 episodes over the past 24 hours, decreasing in amount, last this morning while passing a bowel movement, colonoscopy in 2012 with polyps and internal hemorrhoids, patient declined rectal exam today "they just checked it in the ER"; most likely internal hemorrhoid bleeding 2.   History of tubular adenomas: Last colonoscopy in 2012 was 6 tubular adenomas removed, repeat recommended in 3 years, patient has not followed with [place] since then  Plan: 1.  Scheduled patient for colonoscopy with Dr. Henrene Pastor in [facility].  Did discuss risk, benefits, limitations and alternatives and patient agrees to proceed.  Patient requested this be scheduled at least 3 weeks from now due to work obligations. 2.  Recommend patient continue MiraLAX once daily to continue with a soft formed daily bowel movements. 3.  Patient can stay on a low fiber diet for the next couple of days but then I would recommend that he increase fiber in his diet to least 25-35 g/day. 4.  Patient to follow in clinic per recommendations from Dr. Henrene Pastor after time of procedure.  Ellouise Newer, PA-C [facility] 02/20/2018, 1:47 PM  Cc: Donato Heinz, MD

## 2018-02-20 NOTE — Patient Instructions (Signed)
You have been scheduled for a colonoscopy. Please follow written instructions given to you at your visit today.  Please pick up your prep supplies at the pharmacy within the next 1-3 days. If you use inhalers (even only as needed), please bring them with you on the day of your procedure. Your physician has requested that you go to www.startemmi.com and enter the access code given to you at your visit today. This web site gives a general overview about your procedure. However, you should still follow specific instructions given to you by our office regarding your preparation for the procedure.  Continue Miralax daily.

## 2018-03-13 ENCOUNTER — Encounter: Payer: Self-pay | Admitting: Internal Medicine

## 2018-03-27 ENCOUNTER — Encounter: Payer: Self-pay | Admitting: Internal Medicine

## 2018-03-27 ENCOUNTER — Ambulatory Visit (AMBULATORY_SURGERY_CENTER): Payer: 59 | Admitting: Internal Medicine

## 2018-03-27 VITALS — BP 146/79 | HR 61 | Temp 98.4°F | Resp 19 | Ht 67.0 in | Wt 285.0 lb

## 2018-03-27 DIAGNOSIS — D128 Benign neoplasm of rectum: Secondary | ICD-10-CM

## 2018-03-27 DIAGNOSIS — K635 Polyp of colon: Secondary | ICD-10-CM

## 2018-03-27 DIAGNOSIS — K621 Rectal polyp: Secondary | ICD-10-CM | POA: Diagnosis not present

## 2018-03-27 DIAGNOSIS — D129 Benign neoplasm of anus and anal canal: Secondary | ICD-10-CM

## 2018-03-27 DIAGNOSIS — D122 Benign neoplasm of ascending colon: Secondary | ICD-10-CM

## 2018-03-27 DIAGNOSIS — D125 Benign neoplasm of sigmoid colon: Secondary | ICD-10-CM

## 2018-03-27 DIAGNOSIS — D123 Benign neoplasm of transverse colon: Secondary | ICD-10-CM | POA: Diagnosis not present

## 2018-03-27 DIAGNOSIS — Z1211 Encounter for screening for malignant neoplasm of colon: Secondary | ICD-10-CM

## 2018-03-27 DIAGNOSIS — D124 Benign neoplasm of descending colon: Secondary | ICD-10-CM

## 2018-03-27 DIAGNOSIS — K625 Hemorrhage of anus and rectum: Secondary | ICD-10-CM

## 2018-03-27 MED ORDER — SODIUM CHLORIDE 0.9 % IV SOLN: 500.0000 mL | Freq: Once | INTRAVENOUS | Status: AC

## 2018-03-27 NOTE — Patient Instructions (Signed)
YOU HAD AN ENDOSCOPIC PROCEDURE TODAY AT THE [facility]:   Refer to the procedure report that was given to you for any specific questions about what was found during the examination.  If the procedure report does not answer your questions, please call your gastroenterologist to clarify.  If you requested that your care partner not be given the details of your procedure findings, then the procedure report has been included in a sealed envelope for you to review at your convenience later.  YOU SHOULD EXPECT: Some feelings of bloating in the abdomen. Passage of more gas than usual.  Walking can help get rid of the air that was put into your GI tract during the procedure and reduce the bloating. If you had a lower endoscopy (such as a colonoscopy or flexible sigmoidoscopy) you may notice spotting of blood in your stool or on the toilet paper. If you underwent a bowel prep for your procedure, you may not have a normal bowel movement for a few days.  Please Note:  You might notice some irritation and congestion in your nose or some drainage.  This is from the oxygen used during your procedure.  There is no need for concern and it should clear up in a day or so.  SYMPTOMS TO REPORT IMMEDIATELY:   Following lower endoscopy (colonoscopy or flexible sigmoidoscopy):  Excessive amounts of blood in the stool  Significant tenderness or worsening of abdominal pains  Swelling of the abdomen that is new, acute  Fever of 100F or higher  For urgent or emergent issues, a gastroenterologist can be reached at any hour by calling [phone].   DIET:  We do recommend a small meal at first, but then you may proceed to your regular diet.  Drink plenty of fluids but you should avoid alcoholic beverages for 24 hours.  MEDICATIONS: Continue present medications.  Please see handouts given to you by your recovery nurse.  ACTIVITY:  You should plan to take it easy for the rest of today and you should NOT  DRIVE or use heavy machinery until tomorrow (because of the sedation medicines used during the test).    FOLLOW UP: Our staff will call the number listed on your records the next business day following your procedure to check on you and address any questions or concerns that you may have regarding the information given to you following your procedure. If we do not reach you, we will leave a message.  However, if you are feeling well and you are not experiencing any problems, there is no need to return our call.  We will assume that you have returned to your regular daily activities without incident.  If any biopsies were taken you will be contacted by phone or by letter within the next 1-3 weeks.  Please call us at [phone] if you have not heard about the biopsies in 3 weeks.   Thank you for allowing us to provide for your healthcare needs today.   SIGNATURES/CONFIDENTIALITY: You and/or your care partner have signed paperwork which will be entered into your electronic medical record.  These signatures attest to the fact that that the information above on your After Visit Summary has been reviewed and is understood.  Full responsibility of the confidentiality of this discharge information lies with you and/or your care-partner. 

## 2018-03-27 NOTE — Progress Notes (Signed)
Pt's states no medical or surgical changes since previsit or office visit. 

## 2018-03-27 NOTE — Progress Notes (Signed)
Called to room to assist during endoscopic procedure.  Patient ID and intended procedure confirmed with present staff. Received instructions for my participation in the procedure from the performing physician.  

## 2018-03-27 NOTE — Op Note (Signed)
[name] Procedure Date: 03/27/2018 3:29 PM MRN: [id number] Endoscopist: Docia Chuck. Henrene Pastor , MD Age: 62 Referring MD:  Date of Birth: 1955/06/16 Gender: Male Account #: [id number] Procedure:                Colonoscopy with cold snare polypectomy x 17 Indications:              High risk colon cancer surveillance: Personal                            history of adenoma (10 mm or greater in size), High                            risk colon cancer surveillance: Personal history of                            multiple (3 or more) adenomas. Index exam October                            2012. Well overdue for follow-up Medicines:                Monitored Anesthesia Care Procedure:                Pre-Anesthesia Assessment:                           - Prior to the procedure, a History and Physical                            was performed, and patient medications and                            allergies were reviewed. The patient's tolerance of                            previous anesthesia was also reviewed. The risks                            and benefits of the procedure and the sedation                            options and risks were discussed with the patient.                            All questions were answered, and informed consent                            was obtained. Prior Anticoagulants: The patient has                            taken no previous anticoagulant or antiplatelet                            agents. ASA Grade Assessment: II - A patient with  mild systemic disease. After reviewing the risks                            and benefits, the patient was deemed in                            satisfactory condition to undergo the procedure.                           After obtaining informed consent, the colonoscope                            was passed under direct vision. Throughout the   procedure, the patient's blood pressure, pulse, and                            oxygen saturations were monitored continuously. The                            Colonoscope was introduced through the anus and                            advanced to the the cecum, identified by                            appendiceal orifice and ileocecal valve. The                            ileocecal valve, appendiceal orifice, and rectum                            were photographed. The quality of the bowel                            preparation was good. The colonoscopy was performed                            without difficulty. The patient tolerated the                            procedure well. The bowel preparation used was                            SUPREP. Scope In: 3:37:54 PM Scope Out: 4:13:51 PM Scope Withdrawal Time: 0 hours 20 minutes 31 seconds  Total Procedure Duration: 0 hours 35 minutes 57 seconds  Findings:                 17 polyps were found in the rectum, sigmoid colon,                            descending colon, transverse colon and ascending                            colon. The polyps were 2 to 12  mm in size. These                            polyps were removed with a cold snare. Resection                            and retrieval were complete.                           Multiple diverticula were found in the sigmoid                            colon.                           Bleeding internal hemorrhoids were found during                            retroflexion.                           The exam was otherwise without abnormality on                            direct and retroflexion views. Complications:            No immediate complications. Estimated blood loss:                            None. Estimated Blood Loss:     Estimated blood loss: none. Impression:               - Seventeen 2 to 12 mm polyps in the rectum, in the                            sigmoid colon, in the  descending colon, in the                            transverse colon and in the ascending colon,                            removed with a cold snare. Resected and retrieved.                           - Diverticulosis in the sigmoid colon.                           - Bleeding internal hemorrhoids.                           - The examination was otherwise normal on direct                            and retroflexion views. Recommendation:           - Repeat colonoscopy in 1 year for surveillance.                           -  Patient has a contact number available for                            emergencies. The signs and symptoms of potential                            delayed complications were discussed with the                            patient. Return to normal activities tomorrow.                            Written discharge instructions were provided to the                            patient.                           - Resume previous diet.                           - Continue present medications.                           - Await pathology results. Docia Chuck. Henrene Pastor, MD 03/27/2018 4:23:33 PM This report has been signed electronically.

## 2018-03-27 NOTE — Progress Notes (Signed)
PT taken to PACU. Monitors in place. VSS. Report given to RN. 

## 2018-03-28 ENCOUNTER — Telehealth: Payer: Self-pay | Admitting: *Deleted

## 2018-03-28 NOTE — Telephone Encounter (Signed)
  Follow up Call-  Call back number 03/27/2018  Post procedure Call Back phone  # [phone]  Permission to leave phone message Yes  Some recent data might be hidden     Patient questions:  Do you have a fever, pain , or abdominal swelling? No. Pain Score  0 *  Have you tolerated food without any problems? Yes.    Have you been able to return to your normal activities? Yes.    Do you have any questions about your discharge instructions: Diet   No. Medications  No. Follow up visit  No.  Do you have questions or concerns about your Care? No.  Actions: * If pain score is 4 or above: No action needed, pain <4.

## 2018-04-03 ENCOUNTER — Encounter: Payer: Self-pay | Admitting: Internal Medicine

## 2019-05-01 ENCOUNTER — Encounter: Payer: Self-pay | Admitting: Internal Medicine

## 2019-06-07 LAB — HM COLONOSCOPY

## 2020-09-25 ENCOUNTER — Ambulatory Visit (INDEPENDENT_AMBULATORY_CARE_PROVIDER_SITE_OTHER): Payer: 59

## 2020-09-25 ENCOUNTER — Ambulatory Visit (INDEPENDENT_AMBULATORY_CARE_PROVIDER_SITE_OTHER): Payer: 59 | Admitting: Orthopedic Surgery

## 2020-09-25 ENCOUNTER — Other Ambulatory Visit: Payer: Self-pay

## 2020-09-25 DIAGNOSIS — Z96642 Presence of left artificial hip joint: Secondary | ICD-10-CM

## 2020-09-25 MED ORDER — METHOCARBAMOL 500 MG PO TABS: 500.0000 mg | ORAL_TABLET | Freq: Three times a day (TID) | ORAL | 0 refills | Status: DC | PRN

## 2020-09-26 ENCOUNTER — Encounter: Payer: Self-pay | Admitting: Orthopedic Surgery

## 2020-09-26 NOTE — Progress Notes (Signed)
Office Visit Note   Patient: Robert Larson           Date of Birth: Oct 09, 1955           MRN: [id number] Visit Date: 09/25/2020 Requested by: Donato Heinz, MD [address] [place],  [place] [address] PCP: Donato Heinz, MD  Subjective: Chief Complaint  Patient presents with  . 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  He has been doing well with that until he had a fall yesterday.  Landed on the left-hand side.  Having pain with weightbearing since that time.  The pain is primarily posterior and in the hamstring region.  Denies any groin pain.  No real radicular symptoms below the knee.  Not too much in terms of back pain.  He has been taking some over-the-counter medication for the problem.              ROS: All systems reviewed are negative as they relate to the chief complaint within the history of present illness.  Patient denies  fevers or chills.   Assessment & Plan: Visit Diagnoses:  1. History of total left hip replacement     Plan: Impression is well-functioning left total hip replacement following fall on the left-hand side.  He does have a little bit of swelling and tenderness in the hamstring region.  No discrete tenderness proximally at the hamstring origin off of the ischial tuberosities.  I think he is likely pulled a hamstring muscle with subsequent swelling which may become more apparent the further he gets out from injury.  The hip itself looks good and radiographs of the lumbar spine are unremarkable.  Muscle relaxer prescribed.  Follow-up in 3 weeks with Sharee Pimple or Zollie Beckers for recheck.  Does not really look like the hip prosthesis was injured in the fall.  Ace wrap applied to the thigh today.  Anticipate there may be some bruising running down the leg from the mid substance hamstring injury.  Follow-Up Instructions: No follow-ups on file.   Orders:  Orders Placed This  Encounter  Procedures  . XR HIP UNILAT W OR W/O PELVIS 2-3 VIEWS LEFT  . XR Lumbar Spine 2-3 Views   Meds ordered this encounter  Medications  . methocarbamol (ROBAXIN) 500 MG tablet    Sig: Take 1 tablet (500 mg total) by mouth every 8 (eight) hours as needed for muscle spasms.    Dispense:  30 tablet    Refill:  0      Procedures: No procedures performed   Clinical Data: No additional findings.  Objective: Vital Signs: There were no vitals taken for this visit.  Physical Exam:   Constitutional: Patient appears well-developed HEENT:  Head: Normocephalic Eyes:EOM are normal Neck: Normal range of motion Cardiovascular: Normal rate Pulmonary/chest: Effort normal Neurologic: Patient is alert Skin: Skin is warm Psychiatric: Patient has normal mood and affect    Ortho Exam: Ortho exam demonstrates full active and passive range of motion of the right hip.  Left hip has no groin pain with internal ex rotation of the leg.  No nerve root tension signs bilaterally.  Palpable pedal pulses present.  Does have some swelling posteriorly in the left leg compared to the right.  Hamstring strength 5+ out of 5 bilaterally.  Antalgic gait to the left is present.  Ankle dorsiflexion strength 5+ out of 5.  No tenderness to  palpation left ischial tuberosity.  Specialty Comments:  No specialty comments available.  Imaging: XR HIP UNILAT W OR W/O PELVIS 2-3 VIEWS LEFT  Result Date: 09/26/2020 AP pelvis lateral left hip reviewed.  Total hip prosthesis in good position alignment with no complicating features.  No loosening or lucencies around the acetabular cup or femoral prosthesis.  Leg lengths equal.  No periprosthetic fracture.  No bony abnormality of the pelvis  XR Lumbar Spine 2-3 Views  Result Date: 09/26/2020 AP lateral lumbar spine reviewed.  No acute compression fractures.  There is some disc space narrowing with anterior osteophytes present along with mild to moderate facet  arthritis in the lower lumbar spine region.  No spondylolisthesis.    PMFS History: Patient Active Problem List   Diagnosis Date Noted  . Acute right-sided low back pain with bilateral sciatica 03/13/2017  . Contusion of left hip and thigh 02/13/2017   Past Medical History:  Diagnosis Date  . Osteoarthritis   . Protein in urine     Family History  Problem Relation Age of Onset  . Breast cancer Mother   . Lung cancer Father   . Colon cancer Neg Hx   . Rectal cancer Neg Hx     Past Surgical History:  Procedure Laterality Date  . REPLACEMENT TOTAL HIP W/  RESURFACING IMPLANTS     Left    Social History   Occupational History  . Occupation: Artist  Tobacco Use  . Smoking status: Current Every Day Smoker    Packs/day: 1.00    Types: Cigarettes  . Smokeless tobacco: Never Used  Vaping Use  . Vaping Use: Never used  Substance and Sexual Activity  . Alcohol use: No  . Drug use: Never  . Sexual activity: Not on file

## 2021-06-24 LAB — CBC AND DIFFERENTIAL
HCT: 45 (ref 41–53)
Hemoglobin: 15.5 (ref 13.5–17.5)
Platelets: 162 (ref 150–399)
WBC: 7.1

## 2021-06-24 LAB — COMPREHENSIVE METABOLIC PANEL
Albumin: 3.6 (ref 3.5–5.0)
Calcium: 9.5 (ref 8.7–10.7)
Globulin: 3.1
eGFR: 49

## 2021-06-24 LAB — LIPID PANEL
Cholesterol: 159 (ref 0–200)
HDL: 29 — AB (ref 35–70)
LDL Cholesterol: 41
LDl/HDL Ratio: 89
Triglycerides: 243 — AB (ref 40–160)

## 2021-06-24 LAB — BASIC METABOLIC PANEL
BUN: 18 (ref 4–21)
CO2: 25 — AB (ref 13–22)
Chloride: 99 (ref 99–108)
Creatinine: 1.6 — AB (ref <=1.3)
Glucose: 142
Potassium: 3.9 (ref 3.4–5.3)
Sodium: 133 — AB (ref 137–147)

## 2021-06-24 LAB — HEPATIC FUNCTION PANEL
ALT: 24 (ref 10–40)
AST: 35 (ref 14–40)
Alkaline Phosphatase: 70 (ref 25–125)
Bilirubin, Total: 0.6

## 2021-06-24 LAB — CBC: RBC: 4.85 (ref 3.87–5.11)

## 2021-06-24 LAB — MICROALBUMIN / CREATININE URINE RATIO: Microalb Creat Ratio: 77.5

## 2021-07-19 ENCOUNTER — Ambulatory Visit (INDEPENDENT_AMBULATORY_CARE_PROVIDER_SITE_OTHER): Payer: Managed Care, Other (non HMO) | Admitting: Family

## 2021-07-19 ENCOUNTER — Other Ambulatory Visit: Payer: Self-pay

## 2021-07-19 ENCOUNTER — Encounter: Payer: Self-pay | Admitting: Family

## 2021-07-19 VITALS — BP 152/82 | HR 64 | Temp 98.4°F | Ht 66.0 in | Wt 286.4 lb

## 2021-07-19 DIAGNOSIS — F17210 Nicotine dependence, cigarettes, uncomplicated: Secondary | ICD-10-CM

## 2021-07-19 DIAGNOSIS — R635 Abnormal weight gain: Secondary | ICD-10-CM

## 2021-07-19 HISTORY — DX: Nicotine dependence, cigarettes, uncomplicated: F17.210

## 2021-07-19 MED ORDER — VARENICLINE TARTRATE 0.5 MG PO TABS: 0.5000 mg | ORAL_TABLET | Freq: Two times a day (BID) | ORAL | 5 refills | Status: DC

## 2021-07-19 NOTE — Assessment & Plan Note (Signed)
Discussed importance of smoking cessation d/t ill effects on overall health, as well as different options available.  Encouraged smoking cessation.  Gauged progress on stages of change.  [place]ided [email or web address] resource.  Discussed medical options and side effects, pt would like to try generic Chantix,advised on how to take and to not stop taking for 2-29mos after smoking cessation.

## 2021-07-19 NOTE — Patient Instructions (Addendum)
Welcome to [facility]! It was a pleasure meeting you today.  As discussed, I have sent generic Chantix to your pharmacy. Remember to go up gradually on the dose as tolerated, but if having side effects, go back down on the dose. Even 1 pill daily will help you decrease the amount you are smoking. Check with your insurance regarding weight loss medications. Saxenda and Victoza are daily injectables, and Ozempic, Wegovy, and Trulicity are weekly injectables. I have sent a referral to our weight mgt program as well.   Please schedule a 2 month follow up visit today.     PLEASE NOTE:  If you had any LAB tests please let us know if you have not heard back within a few days. You may see your results on MyChart before we have a chance to review them but we will give you a call once they are reviewed by [place]. If we ordered any REFERRALS today, please let us know if you have not heard from their office within the next week.  Let us know through MyChart if you are needing REFILLS, or have your pharmacy send [place] the request. You can also use MyChart to communicate with me or any office staff.  Please try these tips to maintain a healthy lifestyle:  Eat most of your calories during the day when you are active. Eliminate processed foods including packaged sweets (pies, cakes, cookies), reduce intake of potatoes, white bread, white pasta, and white rice. Look for whole grain options, oat flour or almond flour.  Each meal should contain half fruits/vegetables, one quarter protein, and one quarter carbs (no bigger than a computer mouse).  Cut down on sweet beverages. This includes juice, soda, and sweet tea. Also watch fruit intake, though this is a healthier sweet option, it still contains natural sugar! Limit to 3 servings daily.  Drink at least 1 glass of water with each meal and aim for at least 8 glasses per day  Exercise at least 150 minutes every week.

## 2021-07-19 NOTE — Assessment & Plan Note (Signed)
Wt. Loss strategies reviewed including portion control, less carbs including sweets, eating most of calories earlier in day, drinking 64oz water qd, and establishing daily exercise routine. Advised pt to check with insurance to see if any meds are covered.

## 2021-07-20 ENCOUNTER — Encounter: Payer: Self-pay | Admitting: Family

## 2021-07-20 NOTE — Progress Notes (Signed)
New Patient Office Visit  Subjective:  Patient ID: Robert Larson, male    DOB: 03-18-1956  Age: 66 y.o. MRN: [id number]  CC:  Chief Complaint  Patient presents with   New Patient (Initial Visit)    Has not had PCP in years, but does see Dr. Loletha Grayer regularly    Back Pain    Random episodes, will end up walking with a hunched back     Obesity    Wanting to discuss weight loss options     HPI Robert Larson presents for establishing care and to discuss a few problems.   Obesity: Patient complains of obesity. Patient cites health, increased physical ability, self-image as reasons for wanting to lose weight. Amount of time at present weight: years. Greatest amount of weight lost: 60 lb over 6-108mos months. Circumstances associated with regain of weight: unknown. Successful weight loss techniques attempted: commercial weight loss program: [employer] . Unsuccessful weight loss techniques attempted: self-directed dieting. Exercise: none. Number of regular meals per day: 3. Number of snacking episodes per day: 0. Binge behavior?: no. Eating precipitated by stress? yes-sometimes. Guilt feelings associated with eating? no. Use of alcohol: average 0 drinks/week. Use of medications that may cause weight gain none.  Smoking cessation:  smoking 1.5 packs per day, down from 2-2.5ppd a few years ago for many years. Has not tried any medications in past, never tried to quit on his own. Reports daily congested cough, SOB with exertion.  Past Medical History:  Diagnosis Date   Osteoarthritis    Protein in urine    Sleep apnea     Past Surgical History:  Procedure Laterality Date   REPLACEMENT TOTAL HIP W/  RESURFACING IMPLANTS     Left     Family History  Problem Relation Age of Onset   Breast cancer Mother    Lung cancer Father    Colon cancer Neg Hx    Rectal cancer Neg Hx     Social History   Socioeconomic History   Marital status: Married    Spouse name: Not on file   Number of  children: 1   Years of education: Not on file   Highest education level: Not on file  Occupational History   Occupation: Artist  Tobacco Use   Smoking status: Every Day    Packs/day: 1.00    Types: Cigarettes   Smokeless tobacco: Never  Vaping Use   Vaping Use: Never used  Substance and Sexual Activity   Alcohol use: No   Drug use: Never   Sexual activity: Not on file  Other Topics Concern   Not on file  Social History Narrative   Not on file   Social Determinants of Health   Financial Resource Strain: Not on file  Food Insecurity: Not on file  Transportation Needs: Not on file  Physical Activity: Not on file  Stress: Not on file  Social Connections: Not on file  Intimate Partner Violence: Not on file    Objective:   Today's Vitals: BP (!) 152/82    Pulse 64    Temp 98.4 F (36.9 C) (Temporal)    Ht 5\' 6"  (1.676 m)    Wt 286 lb 6.4 oz (129.9 kg)    SpO2 98%    BMI 46.23 kg/m   Physical Exam Vitals and nursing note reviewed.  Constitutional:      General: He is not in acute distress.    Appearance: Normal appearance. He is morbidly  obese.  HENT:     Head: Normocephalic.  Cardiovascular:     Rate and Rhythm: Normal rate and regular rhythm.  Pulmonary:     Effort: Pulmonary effort is normal.     Breath sounds: Normal breath sounds.  Musculoskeletal:        General: Normal range of motion.     Cervical back: Normal range of motion.  Skin:    General: Skin is warm and dry.  Neurological:     Mental Status: He is alert and oriented to person, place, and time.  Psychiatric:        Mood and Affect: Mood normal.    Assessment & Plan:   Problem List Items Addressed This Visit       Other   Cigarette nicotine dependence without complication    Discussed importance of smoking cessation d/t ill effects on overall health, as well as different options available.  Encouraged smoking cessation.  Gauged progress on stages of change.  Provided [email or web address]  resource.  Discussed medical options and side effects, pt would like to try generic Chantix,advised on how to take and to not stop taking for 2-19mos after smoking cessation.      Relevant Medications   varenicline (CHANTIX) 0.5 MG tablet   Abnormal weight gain - Primary    Wt. Loss strategies reviewed including portion control, less carbs including sweets, eating most of calories earlier in day, drinking 64oz water qd, and establishing daily exercise routine. Advised pt to check with insurance to see if any meds are covered.      Relevant Orders   Amb Ref to Medical Weight Management    Outpatient Encounter Medications as of 07/19/2021  Medication Sig   aspirin EC 81 MG tablet Take 81 mg by mouth at bedtime.   dapagliflozin propanediol (FARXIGA) 5 MG TABS tablet Take by mouth daily.   Omega-3 Fatty Acids (FISH OIL PO) Take 2 capsules by mouth daily.   telmisartan-hydrochlorothiazide (MICARDIS HCT) 80-12.5 MG tablet Take 1 tablet by mouth daily after lunch.    varenicline (CHANTIX) 0.5 MG tablet Take 1 tablet (0.5 mg total) by mouth 2 (two) times daily. START with 1 pill every morning for 1 week, then increase to 1 pill twice a day if tolerated   [DISCONTINUED] methocarbamol (ROBAXIN) 500 MG tablet Take 1 tablet (500 mg total) by mouth every 8 (eight) hours as needed for muscle spasms.   [DISCONTINUED] methocarbamol (ROBAXIN-750) 750 MG tablet Take 1 tablet (750 mg total) by mouth every 8 (eight) hours as needed for muscle spasms. (Patient not taking: Reported on 02/20/2018)   [DISCONTINUED] verapamil (VERELAN PM) 180 MG 24 hr capsule Take 180 mg by mouth daily.   Facility-Administered Encounter Medications as of 07/19/2021  Medication   0.9 %  sodium chloride infusion    Follow-up: Return in about 2 months (around 09/16/2021) for with fasting labs - smoking cessation, wt loss.   Jeanie Sewer, NP

## 2021-08-03 ENCOUNTER — Encounter: Payer: Self-pay | Admitting: Family

## 2021-09-16 ENCOUNTER — Ambulatory Visit (INDEPENDENT_AMBULATORY_CARE_PROVIDER_SITE_OTHER): Payer: Managed Care, Other (non HMO) | Admitting: Family

## 2021-09-16 ENCOUNTER — Encounter: Payer: Self-pay | Admitting: Family

## 2021-09-16 VITALS — BP 161/89 | HR 62 | Temp 98.5°F | Ht 67.0 in | Wt 283.5 lb

## 2021-09-16 DIAGNOSIS — I10 Essential (primary) hypertension: Secondary | ICD-10-CM

## 2021-09-16 DIAGNOSIS — E1169 Type 2 diabetes mellitus with other specified complication: Secondary | ICD-10-CM

## 2021-09-16 DIAGNOSIS — F17210 Nicotine dependence, cigarettes, uncomplicated: Secondary | ICD-10-CM | POA: Diagnosis not present

## 2021-09-16 LAB — POCT GLYCOSYLATED HEMOGLOBIN (HGB A1C): Hemoglobin A1C: 6.1 % — AB (ref 4.0–5.6)

## 2021-09-16 MED ORDER — VARENICLINE TARTRATE 0.5 MG PO TABS: ORAL_TABLET | ORAL | 1 refills | Status: DC

## 2021-09-16 MED ORDER — TRULICITY 0.75 MG/0.5ML ~~LOC~~ SOAJ: 0.7500 mg | SUBCUTANEOUS | 0 refills | Status: DC

## 2021-09-16 MED ORDER — METFORMIN HCL ER 500 MG PO TB24
500.0000 mg | ORAL_TABLET | Freq: Every day | ORAL | 0 refills | Status: DC
Start: 2021-09-16 — End: 2021-10-08

## 2021-09-16 NOTE — Progress Notes (Signed)
? ?Subjective:  ? ? ? Patient ID: Robert Larson, male    DOB: Nov 17, 1955, 66 y.o.   MRN: [id number] ? ?Chief Complaint  ?Patient presents with  ? Diabetes  ? Hypertension  ? Nicotine Dependence  ? ?NWG:NFAOZHY cessation:  smoking 1.5 packs per day, down from 2-2.5ppd a few years ago for many years. Has not tried any medications in past, never tried to quit on his own. Reports daily congested cough, SOB with exertion. Pt started generic Chantix and tolerating, has cut down on smoking. ?T2DM: Pt is currently maintained on the following medications for diabetes:None - new ?Denies polyuria/polydipsia/polyphagia ?Denies hypoglycemia ?Home glucose readings range: not checking ?Last A1C was  ?Lab Results  ?Component Value Date  ? HGBA1C 6.1 (A) 09/16/2021  ? ?Hypertension: Patient is currently maintained on the following medications for blood pressure: telmisartan-hctz ?Patient reports good compliance with blood pressure medications. ?Patient denies chest pain, headaches, shortness of breath or swelling. ?Last 3 blood pressure readings in our office are as follows: ?BP Readings from Last 3 Encounters:  ?09/16/21 (!) 161/89  ?07/19/21 (!) 152/82  ?03/27/18 (!) 146/79  ?  ?Health Maintenance Due  ?Topic Date Due  ? FOOT EXAM  Never done  ? OPHTHALMOLOGY EXAM  Never done  ? HIV Screening  Never done  ? Hepatitis C Screening  Never done  ? Zoster Vaccines- Shingrix (2 of 2) 10/21/2011  ? COLONOSCOPY (Pts 45-4yr Insurance coverage will need to be confirmed)  06/06/2020  ? ? ?Past Medical History:  ?Diagnosis Date  ? Acute right-sided low back pain with bilateral sciatica 03/13/2017  ? Contusion of left hip and thigh 02/13/2017  ? Osteoarthritis   ? Protein in urine   ? Sleep apnea   ? ? ?Past Surgical History:  ?Procedure Laterality Date  ? REPLACEMENT TOTAL HIP W/  RESURFACING IMPLANTS    ? Left   ? ? ?Outpatient Medications Prior to Visit  ?Medication Sig Dispense Refill  ? dapagliflozin propanediol (FARXIGA) 5 MG TABS  tablet Take by mouth daily.    ? Omega-3 Fatty Acids (FISH OIL PO) Take 2 capsules by mouth daily.    ? telmisartan-hydrochlorothiazide (MICARDIS HCT) 80-12.5 MG tablet Take 1 tablet by mouth daily after lunch.     ? varenicline (CHANTIX) 0.5 MG tablet Take 1 tablet (0.5 mg total) by mouth 2 (two) times daily. START with 1 pill every morning for 1 week, then increase to 1 pill twice a day if tolerated 60 tablet 5  ? aspirin EC 81 MG tablet Take 81 mg by mouth at bedtime. (Patient not taking: Reported on 09/16/2021)    ? ?Facility-Administered Medications Prior to Visit  ?Medication Dose Route Frequency Provider Last Rate Last Admin  ? 0.9 %  sodium chloride infusion  500 mL Intravenous Once PIrene Shipper MD      ? ?No Known Allergies ? ?   ?Objective:  ?  ?Physical Exam ?Vitals and nursing note reviewed.  ?Constitutional:   ?   General: He is not in acute distress. ?   Appearance: Normal appearance. He is morbidly obese.  ?HENT:  ?   Head: Normocephalic.  ?Cardiovascular:  ?   Rate and Rhythm: Normal rate and regular rhythm.  ?Pulmonary:  ?   Effort: Pulmonary effort is normal.  ?   Breath sounds: Normal breath sounds.  ?Musculoskeletal:     ?   General: Normal range of motion.  ?   Cervical back: Normal range of motion.  ?  Skin: ?   General: Skin is warm and dry.  ?Neurological:  ?   Mental Status: He is alert and oriented to person, place, and time.  ?Psychiatric:     ?   Mood and Affect: Mood normal.  ? ? ?BP (!) 161/89 (BP Location: Left Arm, Patient Position: Sitting, Cuff Size: Large)   Pulse 62   Temp 98.5 ?F (36.9 ?C) (Temporal)   Ht '5\' 7"'$  (1.702 m)   Wt 283 lb 8 oz (128.6 kg)   SpO2 96%   BMI 44.40 kg/m?  ?Wt Readings from Last 3 Encounters:  ?09/16/21 283 lb 8 oz (128.6 kg)  ?07/19/21 286 lb 6.4 oz (129.9 kg)  ?03/27/18 285 lb (129.3 kg)  ? ?   ?Assessment & Plan:  ? ?Problem List Items Addressed This Visit   ? ?  ? Cardiovascular and Mediastinum  ? Essential hypertension - Primary  ?  Under  nephrology care, Dr. Loletha Grayer - pt hesitant to start a new med, advised him to discuss starting Metoprolol qhs - pt is long time smoker. And discuss increasing Telmisartan-HCTZ, starting a magnesium supplement. ?  ?  ?  ? Endocrine  ? Type 2 diabetes mellitus with morbid obesity ([place])  ?  New - starting Metformin ER qd, Trulicity low dose, advised pt and wife on use & SE. f/u 1 month. ?  ?  ? Relevant Medications  ? metFORMIN (GLUCOPHAGE-XR) 500 MG 24 hr tablet  ? Dulaglutide (TRULICITY) 9.14 NW/2.9FA SOPN  ? Other Relevant Orders  ? POCT HgB A1C (Completed)  ?  ? Other  ? Cigarette nicotine dependence without complication  ?  Chronic - pt taking 1 pill bid and reports cutting down on # of cigs/day, advised increasing dose to 2 pillsqam and 1 pill qpm for 1-2w, then 2 pills bid. refill sent. ?  ?  ? Relevant Medications  ? varenicline (CHANTIX) 0.5 MG tablet  ? ? ?Meds ordered this encounter  ?Medications  ? metFORMIN (GLUCOPHAGE-XR) 500 MG 24 hr tablet  ?  Sig: Take 1 tablet (500 mg total) by mouth daily with breakfast.  ?  Dispense:  30 tablet  ?  Refill:  0  ?  Order Specific Question:   Supervising Provider  ?  Answer:   ANDY, CAMILLE L [[id number]]  ? Dulaglutide (TRULICITY) 2.13 YQ/6.5HQ SOPN  ?  Sig: Inject 0.75 mg into the skin once a week.  ?  Dispense:  2 mL  ?  Refill:  0  ?  Order Specific Question:   Supervising Provider  ?  Answer:   ANDY, CAMILLE L [[id number]]  ? varenicline (CHANTIX) 0.5 MG tablet  ?  Sig: Increase to 2 tabs in am, 1 tab in pm for 1-2 weeks, then 2 tabs am and 2 tabs pm.  ?  Dispense:  90 tablet  ?  Refill:  1  ?  Order Specific Question:   Supervising Provider  ?  Answer:   ANDY, CAMILLE L [[id number]]  ? ? ?Jeanie Sewer, NP ? ? ?

## 2021-09-16 NOTE — Assessment & Plan Note (Signed)
Under nephrology care, Dr. Loletha Grayer - pt hesitant to start a new med, advised him to discuss starting Metoprolol qhs - pt is long time smoker. And discuss increasing Telmisartan-HCTZ, starting a magnesium supplement. ?

## 2021-09-16 NOTE — Assessment & Plan Note (Signed)
New - starting Metformin ER qd, Trulicity low dose, advised pt and wife on use & SE. f/u 1 month. ?

## 2021-09-16 NOTE — Patient Instructions (Addendum)
It was very nice to see you today! ? ?For your Blood pressure: ask Dr. Marval Regal about starting Metoprolol succinate at bedtime. This is a good medicine for your heart as well as lowering your blood pressure slightly. ?Also ask about taking Magnesium oxide, glycinate or L-Threonate - helps with blood pressure. ?Keep drinking at least 2L of water daily and eat a low sodium diet. ? ?Your A1C (3 mo. average of your blood sugar) is considered borderline diabetes which helps you qualify for weight loss medications. I am sending Metformin (have to have this started for insurance to cover the other meds!) and also sending Trulicity injectable. Let me know if your copay is too high. ? ?Continue working on losing weight as you have been, you can do it! ? ?Schedule a 1 month follow up visit. ? ? ? ?PLEASE NOTE: ? ?If you had any lab tests please let us know if you have not heard back within a few days. You may see your results on MyChart before we have a chance to review them but we will give you a call once they are reviewed by [place]. If we ordered any referrals today, please let us know if you have not heard from their office within the next week.  ? ?Please try these tips to maintain a healthy lifestyle: ? ?Eat most of your calories during the day when you are active. Eliminate processed foods including packaged sweets (pies, cakes, cookies), reduce intake of potatoes, white bread, white pasta, and white rice. Look for whole grain options, oat flour or almond flour. ? ?Each meal should contain half fruits/vegetables, one quarter protein, and one quarter carbs (no bigger than a computer mouse). ? ?Cut down on sweet beverages. This includes juice, soda, and sweet tea. Also watch fruit intake, though this is a healthier sweet option, it still contains natural sugar! Limit to 3 servings daily. ? ?Drink at least 1 glass of water with each meal and aim for at least 8 glasses per day ? ?Exercise at least 150 minutes every week.   ? ?

## 2021-09-16 NOTE — Assessment & Plan Note (Signed)
Chronic - pt taking 1 pill bid and reports cutting down on # of cigs/day, advised increasing dose to 2 pillsqam and 1 pill qpm for 1-2w, then 2 pills bid. refill sent. ?

## 2021-10-03 ENCOUNTER — Other Ambulatory Visit: Payer: Self-pay | Admitting: Family

## 2021-10-03 DIAGNOSIS — E1169 Type 2 diabetes mellitus with other specified complication: Secondary | ICD-10-CM

## 2021-10-08 ENCOUNTER — Other Ambulatory Visit: Payer: Self-pay | Admitting: Family

## 2021-10-08 DIAGNOSIS — E1169 Type 2 diabetes mellitus with other specified complication: Secondary | ICD-10-CM

## 2021-10-08 DIAGNOSIS — F17210 Nicotine dependence, cigarettes, uncomplicated: Secondary | ICD-10-CM

## 2021-10-09 ENCOUNTER — Other Ambulatory Visit: Payer: Self-pay | Admitting: Family

## 2021-10-09 DIAGNOSIS — E1169 Type 2 diabetes mellitus with other specified complication: Secondary | ICD-10-CM

## 2021-10-09 DIAGNOSIS — F17210 Nicotine dependence, cigarettes, uncomplicated: Secondary | ICD-10-CM

## 2021-10-14 ENCOUNTER — Ambulatory Visit (INDEPENDENT_AMBULATORY_CARE_PROVIDER_SITE_OTHER): Payer: Commercial Managed Care - HMO | Admitting: Family

## 2021-10-14 ENCOUNTER — Other Ambulatory Visit: Payer: Self-pay | Admitting: Family

## 2021-10-14 ENCOUNTER — Encounter: Payer: Self-pay | Admitting: Family

## 2021-10-14 VITALS — BP 155/81 | HR 51 | Temp 98.3°F | Ht 67.0 in | Wt 284.4 lb

## 2021-10-14 DIAGNOSIS — I10 Essential (primary) hypertension: Secondary | ICD-10-CM

## 2021-10-14 DIAGNOSIS — E1169 Type 2 diabetes mellitus with other specified complication: Secondary | ICD-10-CM | POA: Diagnosis not present

## 2021-10-14 MED ORDER — DAPAGLIFLOZIN PROPANEDIOL 5 MG PO TABS
5.0000 mg | ORAL_TABLET | Freq: Every day | ORAL | 1 refills | Status: DC
Start: 2021-10-14 — End: 2021-12-07

## 2021-10-14 MED ORDER — METFORMIN HCL ER 500 MG PO TB24: ORAL_TABLET | ORAL | 1 refills | Status: DC

## 2021-10-14 MED ORDER — TRULICITY 1.5 MG/0.5ML ~~LOC~~ SOAJ: 1.5000 mg | SUBCUTANEOUS | 2 refills | Status: DC

## 2021-10-14 NOTE — Progress Notes (Signed)
? ?Subjective:  ? ? ? Patient ID: Robert Larson, male    DOB: 1955-10-28, 66 y.o.   MRN: [id number] ? ?Chief Complaint  ?Patient presents with  ? Weight Loss  ? Prediabetes  ? ?HPI: ?Hypertension: Patient is currently maintained on the following medications for blood pressure: telmisartan-hctz. ?Patient reports good compliance with blood pressure medications. ?Patient denies chest pain, headaches, shortness of breath or swelling. ?Last 3 blood pressure readings in our office are as follows: ?BP Readings from Last 3 Encounters:  ?10/14/21 (!) 155/81  ?09/16/21 (!) 161/89  ?07/19/21 (!) 152/82  ?T2DM: Pt is currently maintained on the following medications for diabetes: Metformin, Farxiga, Trulicty. ?Denies polyuria/polydipsia/polyphagia ?Denies hypoglycemia ?Home glucose readings range: not checking ?Last A1C was  ?Lab Results  ?Component Value Date  ? HGBA1C 6.1 (A) 09/16/2021  ? ?Assessment & Plan:  ? ?Problem List Items Addressed This Visit   ? ?  ? Cardiovascular and Mediastinum  ? Essential hypertension - Primary  ?  Chronic - BP still elevated, pt does not want to start new med or adjust BP meds without Dr. Loletha Grayer approval, will call his office today to discuss Metoprolol. f/u in 1-2 mos. ? ?  ?  ?  ? Endocrine  ? Type 2 diabetes mellitus with morbid obesity ([place])  ?  no weight change, pt disappointed, advised he was started on low dose of Trulicity, will increase today, reports no SE, states he has noticed he has less appetite. Continue Wilder Glade, Advised to watch for any constipation. f/u in 1-2 mos. ? ?  ?  ? Relevant Medications  ? Dulaglutide (TRULICITY) 1.5 JO/8.4ZY SOPN  ? metFORMIN (GLUCOPHAGE-XR) 500 MG 24 hr tablet  ? dapagliflozin propanediol (FARXIGA) 5 MG TABS tablet  ? ? ?Outpatient Medications Prior to Visit  ?Medication Sig Dispense Refill  ? aspirin EC 81 MG tablet Take 81 mg by mouth at bedtime.    ? Omega-3 Fatty Acids (FISH OIL PO) Take 2 capsules by mouth daily.    ?  telmisartan-hydrochlorothiazide (MICARDIS HCT) 80-12.5 MG tablet Take 1 tablet by mouth daily after lunch.     ? varenicline (CHANTIX) 0.5 MG tablet INCREASE TO 2 TABS IN AM, 1 TAB IN PM FOR 1-2 WEEKS, THEN 2 TABS AM AND 2 TABS PM. 90 tablet 1  ? dapagliflozin propanediol (FARXIGA) 5 MG TABS tablet Take by mouth daily.    ? metFORMIN (GLUCOPHAGE-XR) 500 MG 24 hr tablet TAKE 1 TABLET BY MOUTH EVERY DAY WITH BREAKFAST 30 tablet 0  ? TRULICITY 6.06 TK/1.6WF SOPN INJECT 0.75 MG SUBCUTANEOUSLY ONE TIME PER WEEK 2 mL 0  ? ?Facility-Administered Medications Prior to Visit  ?Medication Dose Route Frequency Provider Last Rate Last Admin  ? 0.9 %  sodium chloride infusion  500 mL Intravenous Once Irene Shipper, MD      ? ? ?Past Medical History:  ?Diagnosis Date  ? Acute right-sided low back pain with bilateral sciatica 03/13/2017  ? Contusion of left hip and thigh 02/13/2017  ? Osteoarthritis   ? Protein in urine   ? Sleep apnea   ? ? ?Past Surgical History:  ?Procedure Laterality Date  ? REPLACEMENT TOTAL HIP W/  RESURFACING IMPLANTS    ? Left   ? ? ?No Known Allergies ? ?   ?Objective:  ?  ?Physical Exam ?Vitals and nursing note reviewed.  ?Constitutional:   ?   General: He is not in acute distress. ?   Appearance: Normal appearance. He is obese.  ?  HENT:  ?   Head: Normocephalic.  ?Cardiovascular:  ?   Rate and Rhythm: Normal rate and regular rhythm.  ?Pulmonary:  ?   Effort: Pulmonary effort is normal.  ?   Breath sounds: Normal breath sounds.  ?Musculoskeletal:     ?   General: Normal range of motion.  ?   Cervical back: Normal range of motion.  ?Skin: ?   General: Skin is warm and dry.  ?Neurological:  ?   Mental Status: He is alert and oriented to person, place, and time.  ?Psychiatric:     ?   Mood and Affect: Mood normal.  ? ? ?BP (!) 155/81 (BP Location: Left Arm, Patient Position: Sitting, Cuff Size: Large)   Pulse (!) 51   Temp 98.3 ?F (36.8 ?C) (Temporal)   Ht '5\' 7"'$  (1.702 m)   Wt 284 lb 6 oz (129 kg)   SpO2  95%   BMI 44.54 kg/m?  ?Wt Readings from Last 3 Encounters:  ?10/14/21 284 lb 6 oz (129 kg)  ?09/16/21 283 lb 8 oz (128.6 kg)  ?07/19/21 286 lb 6.4 oz (129.9 kg)  ?   ? ?Meds ordered this encounter  ?Medications  ? Dulaglutide (TRULICITY) 1.5 IY/6.4BR SOPN  ?  Sig: Inject 1.5 mg into the skin once a week. Start after you run out of the 0.'75mg'$  dose x 2 each week.  ?  Dispense:  0.5 mL  ?  Refill:  2  ?  Order Specific Question:   Supervising Provider  ?  Answer:   ANDY, CAMILLE L [[id number]]  ? metFORMIN (GLUCOPHAGE-XR) 500 MG 24 hr tablet  ?  Sig: TAKE 1 TABLET BY MOUTH EVERY DAY WITH BREAKFAST  ?  Dispense:  90 tablet  ?  Refill:  1  ?  Order Specific Question:   Supervising Provider  ?  Answer:   ANDY, CAMILLE L [[id number]]  ? dapagliflozin propanediol (FARXIGA) 5 MG TABS tablet  ?  Sig: Take 1 tablet (5 mg total) by mouth daily.  ?  Dispense:  90 tablet  ?  Refill:  1  ?  Order Specific Question:   Supervising Provider  ?  Answer:   ANDY, CAMILLE L [[id number]]  ? ? ?Jeanie Sewer, NP ? ?

## 2021-10-14 NOTE — Assessment & Plan Note (Signed)
no weight change, pt disappointed, advised he was started on low dose of Trulicity, will increase today, reports no SE, states he has noticed he has less appetite. Continue Wilder Glade, Advised to watch for any constipation. f/u in 1-2 mos. ?

## 2021-10-14 NOTE — Patient Instructions (Addendum)
It was very nice to see you today! ? ?I have sent a higher dose of the Trulicity. Use 2 shots of the 0.'75mg'$  injections you have now, one on each side of your abdomen for the next 2 weeks, then start the higher dose, one shot each week. ?Don't be discouraged about not losing weight yet, you were on the lowest dose which is just getting your body used to the medicine. ?Remember to eat mini meals thru the day to avoid any nausea. Drink at least 2 Liters of water daily. ?Watch out for any constipation. ? ?I will talk with Dr. Loletha Grayer about your blood pressure and let you know about adding the Metoprolol for your blood pressure and to help your heart beat better due to smoking. ? ? ? ? ? ?PLEASE NOTE: ? ?If you had any lab tests please let us know if you have not heard back within a few days. You may see your results on MyChart before we have a chance to review them but we will give you a call once they are reviewed by [place]. If we ordered any referrals today, please let us know if you have not heard from their office within the next week.  ? ?

## 2021-10-14 NOTE — Assessment & Plan Note (Signed)
Chronic - BP still elevated, pt does not want to start new med or adjust BP meds without Dr. Loletha Grayer approval, will call his office today to discuss Metoprolol. f/u in 1-2 mos. ?

## 2021-10-18 ENCOUNTER — Other Ambulatory Visit: Payer: Self-pay | Admitting: Family

## 2021-10-18 ENCOUNTER — Telehealth: Payer: Self-pay

## 2021-10-18 DIAGNOSIS — I10 Essential (primary) hypertension: Secondary | ICD-10-CM

## 2021-10-18 MED ORDER — METOPROLOL SUCCINATE ER 25 MG PO TB24: 25.0000 mg | ORAL_TABLET | Freq: Every evening | ORAL | 2 refills | Status: DC

## 2021-10-18 NOTE — Telephone Encounter (Signed)
thanks - trulicity was sent in on 5/11 - he needs to check with the pharmacy

## 2021-10-18 NOTE — Telephone Encounter (Signed)
-----   Message from Jeanie Sewer, NP sent at 10/18/2021 12:12 PM EDT ----- ?Regarding: blood pressure medication ?Please call Andra and let him know I PERSONALLY talked to Dr. Marval Regal (Dr. Loletha Grayer!) and he said it was ok to start the Metoprolol at bedtime for his heart and to try and lower his BP a little. I have sent it to the pharmacy. He should start 1/2 pill for a week, then take 1 full pill if not dizzy in am. Thanks! ? ?

## 2021-10-18 NOTE — Telephone Encounter (Signed)
Pt states he will start the metoprolol and Stated you dicussed with him increasing the dose of his trulicity and would like it sent into the pharmacy.  ?

## 2021-10-19 NOTE — Telephone Encounter (Signed)
I called and spoke with pt in regards to trulicity and metoprolol. Pt gave a verbal understanding.  ?

## 2021-10-25 ENCOUNTER — Other Ambulatory Visit: Payer: Self-pay | Admitting: Family

## 2021-10-25 ENCOUNTER — Telehealth: Payer: Self-pay

## 2021-10-25 DIAGNOSIS — E1169 Type 2 diabetes mellitus with other specified complication: Secondary | ICD-10-CM

## 2021-10-25 NOTE — Telephone Encounter (Signed)
I called and spoke with pharmacy, Pharmacy stated Pt Trulicity was sent in and dose was doubled. Pt would not be able to pick up until the 5/27 due to insurance. I called pt and pt gave a verbalized understanding.

## 2021-10-25 NOTE — Telephone Encounter (Signed)
Patient needs clarification on script of Trulicity sent to [facility] on [address].   Patient states dosage was to be doubled.   Patient states [facility] does not have a script and that he took last injection yesterday 5/21.

## 2021-11-04 ENCOUNTER — Telehealth: Payer: Self-pay | Admitting: Family

## 2021-11-04 NOTE — Telephone Encounter (Signed)
I have called and spoke with pt. Pt stated he has not had a bowel movement for 3 days. Metamucil eventually helped today and he was able to have 2 bowel movements. Pt would like for you to get in touch with him to let him know if it is normal.

## 2021-11-04 NOTE — Telephone Encounter (Signed)
Pt would like a call back. He left a message through the after hours service.

## 2021-11-04 NOTE — Telephone Encounter (Signed)
Yes, I warned him that the Trulicity could cause constipation. Must drink at least 2 liters of water daily and will need to take the Metamucil daily to stay regular. If this stops working, he can add generic Miralax, OTC, 1 scoop daily.  thx

## 2021-11-05 NOTE — Telephone Encounter (Signed)
Pt states he is looking for an update regarding the medication question.  Pt states he is home for the day and will be available on his cell phone.

## 2021-11-05 NOTE — Telephone Encounter (Signed)
I called and spoke with pt. Pt gave a verbal understanding.

## 2021-11-07 ENCOUNTER — Encounter (HOSPITAL_BASED_OUTPATIENT_CLINIC_OR_DEPARTMENT_OTHER): Payer: Self-pay

## 2021-11-07 ENCOUNTER — Other Ambulatory Visit: Payer: Self-pay

## 2021-11-07 ENCOUNTER — Inpatient Hospital Stay (HOSPITAL_BASED_OUTPATIENT_CLINIC_OR_DEPARTMENT_OTHER)
Admission: EM | Admit: 2021-11-07 | Discharge: 2021-12-07 | DRG: 853 | Disposition: A | Discharge status: Home Health | Payer: Commercial Managed Care - HMO | Attending: Family Medicine | Admitting: Family Medicine

## 2021-11-07 ENCOUNTER — Emergency Department (HOSPITAL_BASED_OUTPATIENT_CLINIC_OR_DEPARTMENT_OTHER): Payer: Commercial Managed Care - HMO

## 2021-11-07 DIAGNOSIS — L89313 Pressure ulcer of right buttock, stage 3: Secondary | ICD-10-CM | POA: Diagnosis not present

## 2021-11-07 DIAGNOSIS — Z96642 Presence of left artificial hip joint: Secondary | ICD-10-CM | POA: Diagnosis present

## 2021-11-07 DIAGNOSIS — L0291 Cutaneous abscess, unspecified: Secondary | ICD-10-CM

## 2021-11-07 DIAGNOSIS — Z7984 Long term (current) use of oral hypoglycemic drugs: Secondary | ICD-10-CM

## 2021-11-07 DIAGNOSIS — E66813 Obesity, class 3: Secondary | ICD-10-CM

## 2021-11-07 DIAGNOSIS — L02211 Cutaneous abscess of abdominal wall: Secondary | ICD-10-CM | POA: Diagnosis present

## 2021-11-07 DIAGNOSIS — J9811 Atelectasis: Secondary | ICD-10-CM | POA: Diagnosis not present

## 2021-11-07 DIAGNOSIS — N189 Chronic kidney disease, unspecified: Secondary | ICD-10-CM | POA: Diagnosis not present

## 2021-11-07 DIAGNOSIS — L89156 Pressure-induced deep tissue damage of sacral region: Secondary | ICD-10-CM | POA: Diagnosis not present

## 2021-11-07 DIAGNOSIS — K7460 Unspecified cirrhosis of liver: Secondary | ICD-10-CM | POA: Diagnosis present

## 2021-11-07 DIAGNOSIS — R100 Acute abdomen: Secondary | ICD-10-CM | POA: Diagnosis present

## 2021-11-07 DIAGNOSIS — Z7985 Long-term (current) use of injectable non-insulin antidiabetic drugs: Secondary | ICD-10-CM

## 2021-11-07 DIAGNOSIS — E662 Morbid (severe) obesity with alveolar hypoventilation: Secondary | ICD-10-CM | POA: Diagnosis present

## 2021-11-07 DIAGNOSIS — I4891 Unspecified atrial fibrillation: Secondary | ICD-10-CM | POA: Diagnosis not present

## 2021-11-07 DIAGNOSIS — E873 Alkalosis: Secondary | ICD-10-CM | POA: Diagnosis present

## 2021-11-07 DIAGNOSIS — B9620 Unspecified Escherichia coli [E. coli] as the cause of diseases classified elsewhere: Secondary | ICD-10-CM | POA: Diagnosis not present

## 2021-11-07 DIAGNOSIS — R6521 Severe sepsis with septic shock: Secondary | ICD-10-CM | POA: Diagnosis present

## 2021-11-07 DIAGNOSIS — F17210 Nicotine dependence, cigarettes, uncomplicated: Secondary | ICD-10-CM | POA: Diagnosis not present

## 2021-11-07 DIAGNOSIS — K651 Peritoneal abscess: Secondary | ICD-10-CM | POA: Diagnosis present

## 2021-11-07 DIAGNOSIS — N281 Cyst of kidney, acquired: Secondary | ICD-10-CM

## 2021-11-07 DIAGNOSIS — E871 Hypo-osmolality and hyponatremia: Secondary | ICD-10-CM

## 2021-11-07 DIAGNOSIS — N183 Chronic kidney disease, stage 3 unspecified: Secondary | ICD-10-CM | POA: Diagnosis not present

## 2021-11-07 DIAGNOSIS — N1831 Chronic kidney disease, stage 3a: Secondary | ICD-10-CM | POA: Diagnosis present

## 2021-11-07 DIAGNOSIS — E1122 Type 2 diabetes mellitus with diabetic chronic kidney disease: Secondary | ICD-10-CM | POA: Diagnosis present

## 2021-11-07 DIAGNOSIS — D62 Acute posthemorrhagic anemia: Secondary | ICD-10-CM | POA: Diagnosis present

## 2021-11-07 DIAGNOSIS — N1832 Chronic kidney disease, stage 3b: Secondary | ICD-10-CM | POA: Diagnosis not present

## 2021-11-07 DIAGNOSIS — A4151 Sepsis due to Escherichia coli [E. coli]: Secondary | ICD-10-CM | POA: Diagnosis present

## 2021-11-07 DIAGNOSIS — K2961 Other gastritis with bleeding: Secondary | ICD-10-CM | POA: Diagnosis present

## 2021-11-07 DIAGNOSIS — J189 Pneumonia, unspecified organism: Secondary | ICD-10-CM | POA: Diagnosis present

## 2021-11-07 DIAGNOSIS — L8990 Pressure ulcer of unspecified site, unspecified stage: Secondary | ICD-10-CM | POA: Insufficient documentation

## 2021-11-07 DIAGNOSIS — E1165 Type 2 diabetes mellitus with hyperglycemia: Secondary | ICD-10-CM | POA: Diagnosis present

## 2021-11-07 DIAGNOSIS — D689 Coagulation defect, unspecified: Secondary | ICD-10-CM | POA: Diagnosis present

## 2021-11-07 DIAGNOSIS — M1990 Unspecified osteoarthritis, unspecified site: Secondary | ICD-10-CM

## 2021-11-07 DIAGNOSIS — Y95 Nosocomial condition: Secondary | ICD-10-CM | POA: Diagnosis not present

## 2021-11-07 DIAGNOSIS — K5790 Diverticulosis of intestine, part unspecified, without perforation or abscess without bleeding: Secondary | ICD-10-CM

## 2021-11-07 DIAGNOSIS — N170 Acute kidney failure with tubular necrosis: Secondary | ICD-10-CM | POA: Diagnosis not present

## 2021-11-07 DIAGNOSIS — E1169 Type 2 diabetes mellitus with other specified complication: Secondary | ICD-10-CM

## 2021-11-07 DIAGNOSIS — M48061 Spinal stenosis, lumbar region without neurogenic claudication: Secondary | ICD-10-CM | POA: Diagnosis present

## 2021-11-07 DIAGNOSIS — Z86010 Personal history of colonic polyps: Secondary | ICD-10-CM

## 2021-11-07 DIAGNOSIS — K766 Portal hypertension: Secondary | ICD-10-CM | POA: Diagnosis present

## 2021-11-07 DIAGNOSIS — E278 Other specified disorders of adrenal gland: Secondary | ICD-10-CM | POA: Diagnosis not present

## 2021-11-07 DIAGNOSIS — K920 Hematemesis: Secondary | ICD-10-CM

## 2021-11-07 DIAGNOSIS — J9600 Acute respiratory failure, unspecified whether with hypoxia or hypercapnia: Secondary | ICD-10-CM

## 2021-11-07 DIAGNOSIS — Z781 Physical restraint status: Secondary | ICD-10-CM

## 2021-11-07 DIAGNOSIS — R319 Hematuria, unspecified: Secondary | ICD-10-CM | POA: Diagnosis not present

## 2021-11-07 DIAGNOSIS — K219 Gastro-esophageal reflux disease without esophagitis: Secondary | ICD-10-CM | POA: Diagnosis present

## 2021-11-07 DIAGNOSIS — E279 Disorder of adrenal gland, unspecified: Secondary | ICD-10-CM

## 2021-11-07 DIAGNOSIS — K8020 Calculus of gallbladder without cholecystitis without obstruction: Secondary | ICD-10-CM | POA: Diagnosis present

## 2021-11-07 DIAGNOSIS — D3501 Benign neoplasm of right adrenal gland: Secondary | ICD-10-CM | POA: Diagnosis present

## 2021-11-07 DIAGNOSIS — I130 Hypertensive heart and chronic kidney disease with heart failure and stage 1 through stage 4 chronic kidney disease, or unspecified chronic kidney disease: Secondary | ICD-10-CM | POA: Diagnosis present

## 2021-11-07 DIAGNOSIS — J690 Pneumonitis due to inhalation of food and vomit: Secondary | ICD-10-CM | POA: Diagnosis not present

## 2021-11-07 DIAGNOSIS — I10 Essential (primary) hypertension: Secondary | ICD-10-CM | POA: Diagnosis not present

## 2021-11-07 DIAGNOSIS — K631 Perforation of intestine (nontraumatic): Secondary | ICD-10-CM | POA: Diagnosis not present

## 2021-11-07 DIAGNOSIS — E870 Hyperosmolality and hypernatremia: Secondary | ICD-10-CM | POA: Diagnosis not present

## 2021-11-07 DIAGNOSIS — Z6841 Body Mass Index (BMI) 40.0 and over, adult: Secondary | ICD-10-CM | POA: Diagnosis not present

## 2021-11-07 DIAGNOSIS — B9629 Other Escherichia coli [E. coli] as the cause of diseases classified elsewhere: Secondary | ICD-10-CM | POA: Diagnosis not present

## 2021-11-07 DIAGNOSIS — E876 Hypokalemia: Secondary | ICD-10-CM | POA: Diagnosis not present

## 2021-11-07 DIAGNOSIS — N179 Acute kidney failure, unspecified: Secondary | ICD-10-CM | POA: Diagnosis not present

## 2021-11-07 DIAGNOSIS — A419 Sepsis, unspecified organism: Secondary | ICD-10-CM | POA: Diagnosis not present

## 2021-11-07 DIAGNOSIS — B372 Candidiasis of skin and nail: Secondary | ICD-10-CM | POA: Diagnosis not present

## 2021-11-07 DIAGNOSIS — I480 Paroxysmal atrial fibrillation: Secondary | ICD-10-CM

## 2021-11-07 DIAGNOSIS — G4733 Obstructive sleep apnea (adult) (pediatric): Secondary | ICD-10-CM

## 2021-11-07 DIAGNOSIS — E872 Acidosis, unspecified: Secondary | ICD-10-CM | POA: Diagnosis present

## 2021-11-07 DIAGNOSIS — L89223 Pressure ulcer of left hip, stage 3: Secondary | ICD-10-CM | POA: Diagnosis not present

## 2021-11-07 DIAGNOSIS — R001 Bradycardia, unspecified: Secondary | ICD-10-CM | POA: Diagnosis present

## 2021-11-07 DIAGNOSIS — L89212 Pressure ulcer of right hip, stage 2: Secondary | ICD-10-CM | POA: Diagnosis not present

## 2021-11-07 DIAGNOSIS — R161 Splenomegaly, not elsewhere classified: Secondary | ICD-10-CM | POA: Diagnosis present

## 2021-11-07 DIAGNOSIS — Z79899 Other long term (current) drug therapy: Secondary | ICD-10-CM

## 2021-11-07 DIAGNOSIS — I509 Heart failure, unspecified: Secondary | ICD-10-CM | POA: Diagnosis present

## 2021-11-07 DIAGNOSIS — D3502 Benign neoplasm of left adrenal gland: Secondary | ICD-10-CM | POA: Diagnosis present

## 2021-11-07 DIAGNOSIS — E860 Dehydration: Secondary | ICD-10-CM | POA: Diagnosis present

## 2021-11-07 DIAGNOSIS — J9601 Acute respiratory failure with hypoxia: Secondary | ICD-10-CM

## 2021-11-07 DIAGNOSIS — Z720 Tobacco use: Secondary | ICD-10-CM

## 2021-11-07 DIAGNOSIS — G4730 Sleep apnea, unspecified: Secondary | ICD-10-CM

## 2021-11-07 DIAGNOSIS — E878 Other disorders of electrolyte and fluid balance, not elsewhere classified: Secondary | ICD-10-CM

## 2021-11-07 DIAGNOSIS — G9341 Metabolic encephalopathy: Secondary | ICD-10-CM | POA: Diagnosis not present

## 2021-11-07 DIAGNOSIS — K5792 Diverticulitis of intestine, part unspecified, without perforation or abscess without bleeding: Secondary | ICD-10-CM | POA: Diagnosis not present

## 2021-11-07 DIAGNOSIS — K5721 Diverticulitis of large intestine with perforation and abscess with bleeding: Secondary | ICD-10-CM | POA: Diagnosis not present

## 2021-11-07 HISTORY — DX: Peritoneal abscess: K65.1

## 2021-11-07 HISTORY — DX: Hypo-osmolality and hyponatremia: E87.1

## 2021-11-07 HISTORY — DX: Acute kidney failure, unspecified: N17.9

## 2021-11-07 HISTORY — DX: Chronic kidney disease, stage 3a: N18.31

## 2021-11-07 LAB — COMPREHENSIVE METABOLIC PANEL
ALT: 33 U/L (ref 0–44)
AST: 42 U/L — ABNORMAL HIGH (ref 15–41)
Albumin: 3.4 g/dL — ABNORMAL LOW (ref 3.5–5.0)
Alkaline Phosphatase: 56 U/L (ref 38–126)
Anion gap: 14 (ref 5–15)
BUN: 63 mg/dL — ABNORMAL HIGH (ref 8–23)
CO2: 18 mmol/L — ABNORMAL LOW (ref 22–32)
Calcium: 9.3 mg/dL (ref 8.9–10.3)
Chloride: 95 mmol/L — ABNORMAL LOW (ref 98–111)
Creatinine, Ser: 2.14 mg/dL — ABNORMAL HIGH (ref 0.61–1.24)
GFR, Estimated: 34 mL/min — ABNORMAL LOW (ref >60)
Glucose, Bld: 121 mg/dL — ABNORMAL HIGH (ref 70–99)
Potassium: 3.6 mmol/L (ref 3.5–5.1)
Sodium: 127 mmol/L — ABNORMAL LOW (ref 135–145)
Total Bilirubin: 1.2 mg/dL (ref 0.3–1.2)
Total Protein: 7.7 g/dL (ref 6.5–8.1)

## 2021-11-07 LAB — BASIC METABOLIC PANEL
Anion gap: 11 (ref 5–15)
BUN: 60 mg/dL — ABNORMAL HIGH (ref 8–23)
CO2: 20 mmol/L — ABNORMAL LOW (ref 22–32)
Calcium: 8.9 mg/dL (ref 8.9–10.3)
Chloride: 100 mmol/L (ref 98–111)
Creatinine, Ser: 1.95 mg/dL — ABNORMAL HIGH (ref 0.61–1.24)
GFR, Estimated: 38 mL/min — ABNORMAL LOW (ref >60)
Glucose, Bld: 113 mg/dL — ABNORMAL HIGH (ref 70–99)
Potassium: 3.5 mmol/L (ref 3.5–5.1)
Sodium: 131 mmol/L — ABNORMAL LOW (ref 135–145)

## 2021-11-07 LAB — URINALYSIS, ROUTINE W REFLEX MICROSCOPIC
Bilirubin Urine: NEGATIVE
Glucose, UA: 250 mg/dL — AB
Ketones, ur: NEGATIVE mg/dL
Leukocytes,Ua: NEGATIVE
Nitrite: NEGATIVE
Protein, ur: 100 mg/dL — AB
Specific Gravity, Urine: 1.018 (ref 1.005–1.030)
pH: 5.5 (ref 5.0–8.0)

## 2021-11-07 LAB — CBC
HCT: 43.1 % (ref 39.0–52.0)
Hemoglobin: 14.9 g/dL (ref 13.0–17.0)
MCH: 31.7 pg (ref 26.0–34.0)
MCHC: 34.6 g/dL (ref 30.0–36.0)
MCV: 91.7 fL (ref 80.0–100.0)
Platelets: 224 10*3/uL (ref 150–400)
RBC: 4.7 MIL/uL (ref 4.22–5.81)
RDW: 13.2 % (ref 11.5–15.5)
WBC: 18.8 10*3/uL — ABNORMAL HIGH (ref 4.0–10.5)
nRBC: 0 % (ref 0.0–0.2)

## 2021-11-07 LAB — LIPASE, BLOOD: Lipase: 40 U/L (ref 11–51)

## 2021-11-07 MED ORDER — SODIUM CHLORIDE 0.9 % IV BOLUS
1000.0000 mL | Freq: Once | INTRAVENOUS | Status: AC
Start: 2021-11-07 — End: 2021-11-07
  Administered 2021-11-07: 1000 mL via INTRAVENOUS

## 2021-11-07 MED ORDER — POLYETHYLENE GLYCOL 3350 17 G PO PACK
17.0000 g | PACK | Freq: Every day | ORAL | Status: DC | PRN
Start: 2021-11-07 — End: 2021-11-08

## 2021-11-07 MED ORDER — ACETAMINOPHEN 650 MG RE SUPP
650.0000 mg | Freq: Four times a day (QID) | RECTAL | Status: DC | PRN
  Administered 2021-11-08: 650 mg via RECTAL
  Filled 2021-11-07: qty 1

## 2021-11-07 MED ORDER — PIPERACILLIN-TAZOBACTAM 3.375 G IVPB 30 MIN
3.3750 g | Freq: Once | INTRAVENOUS | Status: AC
  Administered 2021-11-07: 3.375 g via INTRAVENOUS
  Filled 2021-11-07: qty 50

## 2021-11-07 MED ORDER — ACETAMINOPHEN 325 MG PO TABS
650.0000 mg | ORAL_TABLET | Freq: Four times a day (QID) | ORAL | Status: DC | PRN
  Administered 2021-11-14 – 2021-12-02 (×4): 650 mg via ORAL
  Filled 2021-11-07 (×7): qty 2

## 2021-11-07 MED ORDER — MUPIROCIN 2 % EX OINT: 1.0000 "application " | TOPICAL_OINTMENT | Freq: Two times a day (BID) | CUTANEOUS | Status: DC

## 2021-11-07 MED ORDER — SODIUM CHLORIDE 0.9 % IV SOLN: INTRAVENOUS | Status: DC

## 2021-11-07 MED ORDER — PIPERACILLIN-TAZOBACTAM 3.375 G IVPB
3.3750 g | Freq: Three times a day (TID) | INTRAVENOUS | Status: DC
  Administered 2021-11-07 – 2021-11-22 (×44): 3.375 g via INTRAVENOUS
  Filled 2021-11-07 (×43): qty 50

## 2021-11-07 MED ORDER — INSULIN ASPART 100 UNIT/ML IJ SOLN
0.0000 [IU] | Freq: Three times a day (TID) | INTRAMUSCULAR | Status: DC
  Administered 2021-11-09 (×3): 3 [IU] via SUBCUTANEOUS
  Administered 2021-11-10 – 2021-11-12 (×3): 2 [IU] via SUBCUTANEOUS
  Administered 2021-11-14: 3 [IU] via SUBCUTANEOUS
  Administered 2021-11-16 – 2021-11-22 (×5): 2 [IU] via SUBCUTANEOUS
  Administered 2021-11-23: 3 [IU] via SUBCUTANEOUS
  Administered 2021-11-24: 2 [IU] via SUBCUTANEOUS

## 2021-11-07 MED ORDER — SODIUM CHLORIDE 0.9% FLUSH
3.0000 mL | Freq: Two times a day (BID) | INTRAVENOUS | Status: DC
  Administered 2021-11-08 – 2021-11-16 (×11): 3 mL via INTRAVENOUS

## 2021-11-07 MED ORDER — METOPROLOL SUCCINATE ER 25 MG PO TB24
25.0000 mg | ORAL_TABLET | Freq: Every day | ORAL | Status: DC
  Administered 2021-11-07: 25 mg via ORAL
  Filled 2021-11-07: qty 1

## 2021-11-07 NOTE — ED Triage Notes (Signed)
He tells me he has been prescribed Trulicity ~ 2 months ago to facilitate wt. Loss. He tells me that since being placed on Trulicity he has had significant issues with constipation. He c/o some lower abd. Discomfort also for past couple of days.

## 2021-11-07 NOTE — Plan of Care (Signed)
Patient is a 66 year old male with a past medical history significant for but not due to CKD as well as other comorbidities who presented with decreased appetite, constipation and abdominal discomfort and found to have multiple abscesses on his intra-abdominal skin with likely the source from being diverticulitis.  Did not meet sepsis criteria on admission and appeared comfortable.  WBC elevated at 18.8 and his creatinine is higher than his baseline has been.  He was given 1 L fluid in the ED and general surgery Dr. Kieth Brightly was consulted and will follow.  Was placed on IV Zosyn for antibiotic coverage and he was accepted to telemetry bed.  Blood cultures x2 requested given his new onset abscesses

## 2021-11-07 NOTE — Progress Notes (Signed)
Pharmacy Antibiotic Note  Robert Larson is a 66 y.o. male admitted on 11/07/2021 with intra-abdominal abscesses. Pharmacy has been consulted for Zosyn dosing.  Plan: Zosyn 3.375g IV q8h (each dose infused over 4 hours) Monitor renal function, cultures, clinical course   Height: 5' 6.93" (170 cm) Weight: 122.7 kg (270 lb 8.1 oz) IBW/kg (Calculated) : 65.94  Temp (24hrs), Avg:98.5 F (36.9 C), Min:98.2 F (36.8 C), Max:98.7 F (37.1 C)  Recent Labs  Lab 11/07/21 1029  WBC 18.8*  CREATININE 2.14*    Estimated Creatinine Clearance: 43.7 mL/min (A) (by C-G formula based on SCr of 2.14 mg/dL (H)).    No Known Allergies  Antimicrobials this admission: 6/4 Zosyn >>  Dose adjustments this admission: --  Microbiology results: 6/4 BCx:   Thank you for allowing pharmacy to be a part of this patient's care.   Lindell Spar, PharmD, BCPS Clinical Pharmacist  11/07/2021 7:23 PM

## 2021-11-07 NOTE — H&P (Signed)
History and Physical   Robert Larson BDZ:[id number] DOB: Aug 27, 1955 DOA: 11/07/2021  PCP: Jeanie Sewer, NP   Patient coming from: Home  Chief Complaint: Abdominal pain  HPI: Robert Larson is a 66 y.o. male with medical history significant of hypertension, diabetes, CKD presenting with abdominal pain.  Patient reports constipation since Tuesday. Called his PCP and started taking stool softeners and was able to have BMs, 2 today. Reports 2-3 days of increasing pain in his lower abdomen with some associated decreased appetite and decrease PO intake.  He recently started new medication, Trulicity, thought this could be a side effect.  Due to persistent symptoms came to the ED to be evaluated.  Denies fevers, chills, chest pain, shortness of breath, vomiting.  ED Course: Vital signs in the ED stable.  Lab work-up included CMP with sodium 127, chloride 95, bicarb 18, BUN of 62, creatinine elevated to 2.14 from previous baseline of 1.64 months ago, glucose 121, AST 42, albumin 3.4.  CBC with leukocytosis to 18.8.  Lipase level normal.  Urinalysis with glucose, hemoglobin, protein.  Blood cultures pending.  CT of the abdomen pelvis showed no evidence of intestinal obstruction, no evidence of pneumoperitoneum, no evidence of hydronephrosis,.  Did show multiple loculated fluid collections with air-fluid levels in the lower abdomen and pelvis.  Consistent with abscesses.  Largest measuring 11 x 6 x 11 cm.  Also noted was associated inflammatory changes of the mesentery and evidence as possible diverticulitis as the source.  Cannot rule out other infectious or inflammatory etiology.  Also noted was changes consistent with gall sludge, renal cysts and adrenal nodules possibly representing adenomas.  Patient received Zosyn and a liter of IV fluids in the ED.  Review of Systems: As per HPI otherwise all other systems reviewed and are negative.  Past Medical History:  Diagnosis Date   Acute  right-sided low back pain with bilateral sciatica 03/13/2017   Contusion of left hip and thigh 02/13/2017   Osteoarthritis    Protein in urine    Sleep apnea     Past Surgical History:  Procedure Laterality Date   REPLACEMENT TOTAL HIP W/  RESURFACING IMPLANTS     Left     Social History  reports that he has been smoking cigarettes. He has been smoking an average of 1 pack per day. He has never used smokeless tobacco. He reports that he does not drink alcohol and does not use drugs.  No Known Allergies  Family History  Problem Relation Age of Onset   Breast cancer Mother    Lung cancer Father    Colon cancer Neg Hx    Rectal cancer Neg Hx   Reviewed on admission  Prior to Admission medications   Medication Sig Start Date End Date Taking? Authorizing Provider  aspirin EC 81 MG tablet Take 81 mg by mouth at bedtime.    [provider]  dapagliflozin propanediol (FARXIGA) 5 MG TABS tablet Take 1 tablet (5 mg total) by mouth daily. 10/14/21   Jeanie Sewer, NP  Dulaglutide (TRULICITY) 1.5 TM/1.9QQ SOPN Inject 1.5 mg into the skin once a week. Start after you run out of the 0.'75mg'$  dose x 2 each week. 10/14/21   Jeanie Sewer, NP  metFORMIN (GLUCOPHAGE-XR) 500 MG 24 hr tablet TAKE 1 TABLET BY MOUTH EVERY DAY WITH BREAKFAST 10/14/21   Jeanie Sewer, NP  metoprolol succinate (TOPROL-XL) 25 MG 24 hr tablet Take 1 tablet (25 mg total) by mouth at bedtime. START with  1/2 pill for one week, then increase to 1 full pill. 10/18/21   Jeanie Sewer, NP  Omega-3 Fatty Acids (FISH OIL PO) Take 2 capsules by mouth daily.    [provider]  telmisartan-hydrochlorothiazide (MICARDIS HCT) 80-12.5 MG tablet Take 1 tablet by mouth daily after lunch.  01/26/17   [provider]  varenicline (CHANTIX) 0.5 MG tablet INCREASE TO 2 TABS IN AM, 1 TAB IN PM FOR 1-2 WEEKS, THEN 2 TABS AM AND 2 TABS PM. 10/08/21   Jeanie Sewer, NP    Physical Exam: Vitals:    11/07/21 1400 11/07/21 1500 11/07/21 1620 11/07/21 1659  BP: 136/73 136/67 128/69 118/69  Pulse: 68 69 74 74  Resp: 14 (!) 24 (!) 22 (!) 22  Temp:    98.2 F (36.8 C)  TempSrc:    Oral  SpO2: 96% 96% 94% 97%    Physical Exam Constitutional:      General: He is not in acute distress.    Appearance: Normal appearance. He is obese.  HENT:     Head: Normocephalic and atraumatic.     Mouth/Throat:     Mouth: Mucous membranes are moist.     Pharynx: Oropharynx is clear.  Eyes:     Extraocular Movements: Extraocular movements intact.     Pupils: Pupils are equal, round, and reactive to light.  Cardiovascular:     Rate and Rhythm: Normal rate and regular rhythm.     Pulses: Normal pulses.     Heart sounds: Normal heart sounds.  Pulmonary:     Effort: Pulmonary effort is normal. No respiratory distress.     Breath sounds: Normal breath sounds.  Abdominal:     General: Bowel sounds are normal. There is no distension.     Palpations: Abdomen is soft.     Tenderness: There is abdominal tenderness.  Musculoskeletal:        General: No swelling or deformity.  Skin:    General: Skin is warm and dry.  Neurological:     General: No focal deficit present.     Mental Status: Mental status is at baseline.   Labs on Admission: I have personally reviewed following labs and imaging studies  CBC: Recent Labs  Lab 11/07/21 1029  WBC 18.8*  HGB 14.9  HCT 43.1  MCV 91.7  PLT 010    Basic Metabolic Panel: Recent Labs  Lab 11/07/21 1029  NA 127*  K 3.6  CL 95*  CO2 18*  GLUCOSE 121*  BUN 63*  CREATININE 2.14*  CALCIUM 9.3    GFR: CrCl cannot be calculated (Unknown ideal weight.).  Liver Function Tests: Recent Labs  Lab 11/07/21 1029  AST 42*  ALT 33  ALKPHOS 56  BILITOT 1.2  PROT 7.7  ALBUMIN 3.4*    Urine analysis:    Component Value Date/Time   COLORURINE YELLOW 11/07/2021 1029   APPEARANCEUR CLEAR 11/07/2021 1029   LABSPEC 1.018 11/07/2021 1029   PHURINE  5.5 11/07/2021 1029   GLUCOSEU 250 (A) 11/07/2021 1029   HGBUR SMALL (A) 11/07/2021 1029   BILIRUBINUR NEGATIVE 11/07/2021 1029   KETONESUR NEGATIVE 11/07/2021 1029   PROTEINUR 100 (A) 11/07/2021 1029   NITRITE NEGATIVE 11/07/2021 1029   LEUKOCYTESUR NEGATIVE 11/07/2021 1029    Radiological Exams on Admission: CT Abdomen Pelvis Wo Contrast  Addendum Date: 11/07/2021   ADDENDUM REPORT: 11/07/2021 14:20 ADDENDUM: Following should be added to the impression. There are nodules in both adrenals with density measurements of less than  10 Hounsfield units suggesting possible incidental adenomas. Electronically Signed   By: Elmer Picker M.D.   On: 11/07/2021 14:20   Result Date: 11/07/2021 CLINICAL DATA:  Pain left lower quadrant of abdomen EXAM: CT ABDOMEN AND PELVIS WITHOUT CONTRAST TECHNIQUE: Multidetector CT imaging of the abdomen and pelvis was performed following the standard protocol without IV contrast. RADIATION DOSE REDUCTION: This exam was performed according to the departmental dose-optimization program which includes automated exposure control, adjustment of the mA and/or kV according to patient size and/or use of iterative reconstruction technique. COMPARISON:  None Available. FINDINGS: Lower chest: Small linear densities in the lower lung fields may suggest scarring or subsegmental atelectasis. Hepatobiliary: Liver measures 18 cm in length. There is mild nodularity in the liver surface. No focal abnormality is seen. There is no dilation of bile ducts. There is subtle increased density in the dependent portion of gallbladder suggesting sludge and possibly stones. There is no fluid around the gallbladder. Pancreas: Unremarkable. Spleen: Spleen measures 13.6 cm in maximum diameter. Adrenals/Urinary Tract: There is 2.6 cm nodule in the left adrenal. There is 2 cm nodule in the right adrenal. Density measurements are less than 10 Hounsfield units. There is no hydronephrosis. There are few smooth  marginated low-density lesions largest measuring 6.2 cm in size in the anterior midportion of left kidney. There are 2 small high density nodules in the left adrenal. There are no renal or ureteral stones. Urinary bladder is not distended. Beam hardening artifacts are partly obscuring portions of the bladder. Stomach/Bowel: Stomach is unremarkable. Small bowel loops are not dilated. Appendix is not visualized. Scattered diverticula are seen in colon. There is stranding in the mesenteric fat in the lower abdomen. There is 11.5 x 6 x 11.7 cm loculated fluid collection with air-fluid level in the pelvis. There is another loculated fluid collection in the right lower quadrant measuring 7 x 3.8 x 4.1 cm with air-fluid level. There is 3.9 x 2 cm fluid collection in the lower abdomen slightly more to the left of midline. Inflammatory stranding is slightly more in the small bowel mesentery rather than sigmoid colon. Vascular/Lymphatic: There are scattered arterial calcifications. Reproductive: Evaluation of prostate is limited by beam hardening artifacts from orthopedic hardware. Other: There is no ascites or pneumoperitoneum. Musculoskeletal: Degenerative changes are noted in the lumbar spine with spinal stenosis and encroachment of neural foramina at multiple levels. There is previous left hip arthroplasty. IMPRESSION: There is no evidence of intestinal obstruction or pneumoperitoneum. There is no hydronephrosis. There are multiple loculated fluid collections with air-fluid levels in the lower abdomen and pelvis. Largest of these collections is in the pelvic cavity measuring 11.5 x 6 x 11.7 cm. There are other smaller fluid collections in the right lower quadrant and lower abdomen slightly more to the left of midline. Findings suggest possible multiple abscesses. Inflammatory changes are more pronounced in the small bowel mesentery. Diverticulosis of colon without significant wall thickening or focal pericolic stranding.  Findings suggest possible diverticulitis with multiple abscesses or inflammatory or infectious process in the distal small bowel loops with multiple abscesses. Repeat CT with intravenous and oral contrast may be considered for further characterization. Increased density in the lumen of the gallbladder may suggest presence of sludge and possibly tiny stones. Bilateral renal cysts. There 2 small foci of high density in the left kidney, possibly hyperdense cysts. Lumbar spondylosis with spinal stenosis and encroachment of neural foramina at multiple levels. Other findings as described in the body of the report. Electronically  Signed: By: Elmer Picker M.D. On: 11/07/2021 13:56    EKG: Not performed in the emergency department  Assessment/Plan Principal Problem:   Intra-abdominal abscess [facility]) Active Problems:   Essential hypertension   Type 2 diabetes mellitus with morbid obesity ([place])   Diverticulosis   Hyponatremia   Acute renal failure superimposed on chronic kidney disease (HCC)   Intra-abdominal abscesses > Patient presenting with ongoing abdominal pain for the past couple of days noted to have multiple abscesses on CT scan with possible source of diverticulitis. > Noted to have leukocytosis to 18.8.  No measurement of fever in the ED.  Blood cultures pending. > General surgery consulted in the ED and will see the patient. - Appreciate general surgery recommendations - Monitor on telemetry - Continue with IV Zosyn as ordered in the ED - We will continue IV fluids at gentle rate as we await repeat BMP to monitor sodium response - Trend fever curve and white count - Follow-up blood cultures  Hyponatremia AKI on CKD > Patient with known history of CKD presenting with the above symptoms with decreased appetite and decreased p.o. intake with creatinine of 2.14 up from a baseline or at least last measurement of 1.54 months ago. > Sodium noted to be 127 in the ED.  In the setting of AKI  also noted to be on hydrochlorothiazide. > Received 1 L of fluids. - Monitor on telemetry as above - Repeat BMP now to assess sodium response initial fluids - Start slow rate of IV fluids at 75 cc an hour, may adjust pending results of BMP - Trend renal function and electrolytes - Check serum osmolality, urine osmolality, urine sodium  Hypertension - Continue home metoprolol - Holding home telmisartan-hydrochlorothiazide in the setting of AKI and hyponatremia  Diabetes > On injectables and oral as outpatient. - SSI  CT abnormalities > In addition to the abscesses are inflammatory changes above.  Also noted was renal cyst, gallbladder sludge, and adrenal nodules possibly adenoma.   DVT prophylaxis: SCDs Code Status:   Full Family Communication:  None on admission.  Patient states his family is aware of his admission and were with him earlier this evening.  Disposition Plan:   Patient is from:  Home  Anticipated DC to:  Home  Anticipated DC date:  2 to 4 days  Anticipated DC barriers: None  Consults called:  General surgery, consulted in the ED, will see the patient Admission status:  Inpatient, telemetry  Severity of Illness: The appropriate patient status for this patient is INPATIENT. Inpatient status is judged to be reasonable and necessary in order to provide the required intensity of service to ensure the patient's safety. The patient's presenting symptoms, physical exam findings, and initial radiographic and laboratory data in the context of their chronic comorbidities is felt to place them at high risk for further clinical deterioration. Furthermore, it is not anticipated that the patient will be medically stable for discharge from the hospital within 2 midnights of admission.   * I certify that at the point of admission it is my clinical judgment that the patient will require inpatient hospital care spanning beyond 2 midnights from the point of admission due to high intensity  of service, high risk for further deterioration and high frequency of surveillance required.Marcelyn Bruins MD [facility]  How to contact the [facility] Attending or Consulting provider [place] or covering provider during after hours [place], for this patient?   Check the  care team in [facility] and look for a) attending/consulting [place] provider listed and b) the [facility] team listed Log into [email or web address] and use [place]'s universal password to access. If you do not have the password, please contact the hospital operator. Locate the [facility] provider you are looking for under [facility] and page to a number that you can be directly reached. If you still have difficulty reaching the provider, please page the [facility] (Director on Call) for the Hospitalists listed on amion for assistance.  11/07/2021, 6:21 PM

## 2021-11-07 NOTE — ED Provider Notes (Signed)
[facility] EMERGENCY DEPT Provider Note   CSN: [id number] Arrival date & time: 11/07/21  1011     History  Chief Complaint  Patient presents with   Abdominal Pain    Robert Larson is a 66 y.o. male.  Patient is a 66 year old male who presents with abdominal pain.  He said he has been having some constipation for the last couple months.  However he did have a large bowel movement yesterday after taking some stool softeners.  He said over the last 2 days he has had some worsening pain across his lower abdomen.  No known fevers.  He has had a decreased appetite and some nausea but no vomiting.  No urinary symptoms.  He was recently placed on Trulicity and metformin for weight loss in the attributed his symptoms as a possible side effect of those medications.      Home Medications Prior to Admission medications   Medication Sig Start Date End Date Taking? Authorizing Provider  aspirin EC 81 MG tablet Take 81 mg by mouth at bedtime.    [provider]  dapagliflozin propanediol (FARXIGA) 5 MG TABS tablet Take 1 tablet (5 mg total) by mouth daily. 10/14/21   Jeanie Sewer, NP  Dulaglutide (TRULICITY) 1.5 WE/3.1VQ SOPN Inject 1.5 mg into the skin once a week. Start after you run out of the 0.'75mg'$  dose x 2 each week. 10/14/21   Jeanie Sewer, NP  metFORMIN (GLUCOPHAGE-XR) 500 MG 24 hr tablet TAKE 1 TABLET BY MOUTH EVERY DAY WITH BREAKFAST 10/14/21   Jeanie Sewer, NP  metoprolol succinate (TOPROL-XL) 25 MG 24 hr tablet Take 1 tablet (25 mg total) by mouth at bedtime. START with 1/2 pill for one week, then increase to 1 full pill. 10/18/21   Jeanie Sewer, NP  Omega-3 Fatty Acids (FISH OIL PO) Take 2 capsules by mouth daily.    [provider]  telmisartan-hydrochlorothiazide (MICARDIS HCT) 80-12.5 MG tablet Take 1 tablet by mouth daily after lunch.  01/26/17   [provider]  varenicline (CHANTIX) 0.5 MG tablet INCREASE TO 2 TABS IN  AM, 1 TAB IN PM FOR 1-2 WEEKS, THEN 2 TABS AM AND 2 TABS PM. 10/08/21   Jeanie Sewer, NP      Allergies    Patient has no known allergies.    Review of Systems   Review of Systems  Constitutional:  Positive for appetite change. Negative for chills, diaphoresis, fatigue and fever.  HENT:  Negative for congestion, rhinorrhea and sneezing.   Eyes: Negative.   Respiratory:  Negative for cough, chest tightness and shortness of breath.   Cardiovascular:  Negative for chest pain and leg swelling.  Gastrointestinal:  Positive for abdominal pain and nausea. Negative for blood in stool, diarrhea and vomiting.  Genitourinary:  Negative for difficulty urinating, flank pain, frequency and hematuria.  Musculoskeletal:  Negative for arthralgias and back pain.  Skin:  Negative for rash.  Neurological:  Negative for dizziness, speech difficulty, weakness, numbness and headaches.   Physical Exam Updated Vital Signs BP 136/73 (BP Location: Right Arm)   Pulse 68   Temp 98.7 F (37.1 C)   Resp 14   SpO2 96%  Physical Exam Constitutional:      Appearance: He is well-developed.  HENT:     Head: Normocephalic and atraumatic.  Eyes:     Pupils: Pupils are equal, round, and reactive to light.  Cardiovascular:     Rate and Rhythm: Normal rate and regular rhythm.  Heart sounds: Normal heart sounds.  Pulmonary:     Effort: Pulmonary effort is normal. No respiratory distress.     Breath sounds: Normal breath sounds. No wheezing or rales.  Chest:     Chest wall: No tenderness.  Abdominal:     General: Bowel sounds are normal.     Palpations: Abdomen is soft.     Tenderness: There is abdominal tenderness in the right lower quadrant and suprapubic area. There is no guarding or rebound.  Musculoskeletal:        General: Normal range of motion.     Cervical back: Normal range of motion and neck supple.  Lymphadenopathy:     Cervical: No cervical adenopathy.  Skin:    General: Skin is warm and  dry.     Findings: No rash.  Neurological:     Mental Status: He is alert and oriented to person, place, and time.    ED Results / Procedures / Treatments   Labs (all labs ordered are listed, but only abnormal results are displayed) Labs Reviewed  COMPREHENSIVE METABOLIC PANEL - Abnormal; Notable for the following components:      Result Value   Sodium 127 (*)    Chloride 95 (*)    CO2 18 (*)    Glucose, Bld 121 (*)    BUN 63 (*)    Creatinine, Ser 2.14 (*)    Albumin 3.4 (*)    AST 42 (*)    GFR, Estimated 34 (*)    All other components within normal limits  CBC - Abnormal; Notable for the following components:   WBC 18.8 (*)    All other components within normal limits  URINALYSIS, ROUTINE W REFLEX MICROSCOPIC - Abnormal; Notable for the following components:   Glucose, UA 250 (*)    Hgb urine dipstick SMALL (*)    Protein, ur 100 (*)    All other components within normal limits  LIPASE, BLOOD    EKG None  Radiology CT Abdomen Pelvis Wo Contrast  Addendum Date: 11/07/2021   ADDENDUM REPORT: 11/07/2021 14:20 ADDENDUM: Following should be added to the impression. There are nodules in both adrenals with density measurements of less than 10 Hounsfield units suggesting possible incidental adenomas. Electronically Signed   By: Elmer Picker M.D.   On: 11/07/2021 14:20   Result Date: 11/07/2021 CLINICAL DATA:  Pain left lower quadrant of abdomen EXAM: CT ABDOMEN AND PELVIS WITHOUT CONTRAST TECHNIQUE: Multidetector CT imaging of the abdomen and pelvis was performed following the standard protocol without IV contrast. RADIATION DOSE REDUCTION: This exam was performed according to the departmental dose-optimization program which includes automated exposure control, adjustment of the mA and/or kV according to patient size and/or use of iterative reconstruction technique. COMPARISON:  None Available. FINDINGS: Lower chest: Small linear densities in the lower lung fields may suggest  scarring or subsegmental atelectasis. Hepatobiliary: Liver measures 18 cm in length. There is mild nodularity in the liver surface. No focal abnormality is seen. There is no dilation of bile ducts. There is subtle increased density in the dependent portion of gallbladder suggesting sludge and possibly stones. There is no fluid around the gallbladder. Pancreas: Unremarkable. Spleen: Spleen measures 13.6 cm in maximum diameter. Adrenals/Urinary Tract: There is 2.6 cm nodule in the left adrenal. There is 2 cm nodule in the right adrenal. Density measurements are less than 10 Hounsfield units. There is no hydronephrosis. There are few smooth marginated low-density lesions largest measuring 6.2 cm in size in the  anterior midportion of left kidney. There are 2 small high density nodules in the left adrenal. There are no renal or ureteral stones. Urinary bladder is not distended. Beam hardening artifacts are partly obscuring portions of the bladder. Stomach/Bowel: Stomach is unremarkable. Small bowel loops are not dilated. Appendix is not visualized. Scattered diverticula are seen in colon. There is stranding in the mesenteric fat in the lower abdomen. There is 11.5 x 6 x 11.7 cm loculated fluid collection with air-fluid level in the pelvis. There is another loculated fluid collection in the right lower quadrant measuring 7 x 3.8 x 4.1 cm with air-fluid level. There is 3.9 x 2 cm fluid collection in the lower abdomen slightly more to the left of midline. Inflammatory stranding is slightly more in the small bowel mesentery rather than sigmoid colon. Vascular/Lymphatic: There are scattered arterial calcifications. Reproductive: Evaluation of prostate is limited by beam hardening artifacts from orthopedic hardware. Other: There is no ascites or pneumoperitoneum. Musculoskeletal: Degenerative changes are noted in the lumbar spine with spinal stenosis and encroachment of neural foramina at multiple levels. There is previous  left hip arthroplasty. IMPRESSION: There is no evidence of intestinal obstruction or pneumoperitoneum. There is no hydronephrosis. There are multiple loculated fluid collections with air-fluid levels in the lower abdomen and pelvis. Largest of these collections is in the pelvic cavity measuring 11.5 x 6 x 11.7 cm. There are other smaller fluid collections in the right lower quadrant and lower abdomen slightly more to the left of midline. Findings suggest possible multiple abscesses. Inflammatory changes are more pronounced in the small bowel mesentery. Diverticulosis of colon without significant wall thickening or focal pericolic stranding. Findings suggest possible diverticulitis with multiple abscesses or inflammatory or infectious process in the distal small bowel loops with multiple abscesses. Repeat CT with intravenous and oral contrast may be considered for further characterization. Increased density in the lumen of the gallbladder may suggest presence of sludge and possibly tiny stones. Bilateral renal cysts. There 2 small foci of high density in the left kidney, possibly hyperdense cysts. Lumbar spondylosis with spinal stenosis and encroachment of neural foramina at multiple levels. Other findings as described in the body of the report. Electronically Signed: By: Elmer Picker M.D. On: 11/07/2021 13:56    Procedures Procedures    Medications Ordered in ED Medications  piperacillin-tazobactam (ZOSYN) IVPB 3.375 g (3.375 g Intravenous New Bag/Given 11/07/21 1432)  sodium chloride 0.9 % bolus 1,000 mL (0 mLs Intravenous Stopped 11/07/21 1438)    ED Course/ Medical Decision Making/ A&P                           Medical Decision Making Amount and/or Complexity of Data Reviewed Labs: ordered. Radiology: ordered.  Risk Prescription drug management.   Patient is a 66 year old male who presents with abdominal pain and decreased appetite.  His white count is markedly elevated at 18,000.  His  creatinine is elevated based on prior chart review of his labs.  However he does have a known history of chronic kidney disease and is followed by [facility].  I do not know what his recent baselines have been.  On chart review, his most recent was from January which was 1.6.  His sodium level is a little low at 127.  CT scan of the abdomen pelvis was performed without IV contrast due to his elevated creatinine.  There is multiple abdominal abscesses with the largest being about 11 cm in  diameter.  There is no clear etiology but possibly diverticulitis.  He was started on IV Zosyn.  Blood cultures were ordered.  I spoke with Dr. Kieth Brightly with general surgery who will consult on the patient.  I spoke with Dr. Alfredia Ferguson with the hospitalist service he will admit the patient.  Final Clinical Impression(s) / ED Diagnoses Final diagnoses:  Intra-abdominal abscess [facility])    Rx / Superior Orders ED Discharge Orders     None         Malvin Johns, MD 11/07/21 1452

## 2021-11-07 NOTE — ED Notes (Signed)
Pt went to CT as Fluids were being hooked up. Will give fluids after he gets back.

## 2021-11-07 NOTE — ED Notes (Signed)
Report given to the Floor. 

## 2021-11-08 ENCOUNTER — Encounter (HOSPITAL_COMMUNITY): Payer: Self-pay | Admitting: Internal Medicine

## 2021-11-08 ENCOUNTER — Inpatient Hospital Stay (HOSPITAL_COMMUNITY): Payer: Commercial Managed Care - HMO

## 2021-11-08 DIAGNOSIS — A419 Sepsis, unspecified organism: Secondary | ICD-10-CM | POA: Diagnosis not present

## 2021-11-08 DIAGNOSIS — N281 Cyst of kidney, acquired: Secondary | ICD-10-CM

## 2021-11-08 DIAGNOSIS — R6521 Severe sepsis with septic shock: Secondary | ICD-10-CM

## 2021-11-08 DIAGNOSIS — K651 Peritoneal abscess: Secondary | ICD-10-CM | POA: Diagnosis not present

## 2021-11-08 DIAGNOSIS — N183 Chronic kidney disease, stage 3 unspecified: Secondary | ICD-10-CM

## 2021-11-08 DIAGNOSIS — Z720 Tobacco use: Secondary | ICD-10-CM

## 2021-11-08 DIAGNOSIS — Z86010 Personal history of colonic polyps: Secondary | ICD-10-CM | POA: Insufficient documentation

## 2021-11-08 DIAGNOSIS — E278 Other specified disorders of adrenal gland: Secondary | ICD-10-CM

## 2021-11-08 DIAGNOSIS — M1990 Unspecified osteoarthritis, unspecified site: Secondary | ICD-10-CM | POA: Insufficient documentation

## 2021-11-08 DIAGNOSIS — K8020 Calculus of gallbladder without cholecystitis without obstruction: Secondary | ICD-10-CM | POA: Insufficient documentation

## 2021-11-08 DIAGNOSIS — K920 Hematemesis: Secondary | ICD-10-CM

## 2021-11-08 DIAGNOSIS — G4730 Sleep apnea, unspecified: Secondary | ICD-10-CM

## 2021-11-08 DIAGNOSIS — G4733 Obstructive sleep apnea (adult) (pediatric): Secondary | ICD-10-CM

## 2021-11-08 DIAGNOSIS — M48061 Spinal stenosis, lumbar region without neurogenic claudication: Secondary | ICD-10-CM | POA: Insufficient documentation

## 2021-11-08 HISTORY — DX: Hematemesis: K92.0

## 2021-11-08 HISTORY — DX: Sepsis, unspecified organism: A41.9

## 2021-11-08 HISTORY — DX: Tobacco use: Z72.0

## 2021-11-08 LAB — CBC
HCT: 43.5 % (ref 39.0–52.0)
Hemoglobin: 14.4 g/dL (ref 13.0–17.0)
MCH: 31.7 pg (ref 26.0–34.0)
MCHC: 33.1 g/dL (ref 30.0–36.0)
MCV: 95.8 fL (ref 80.0–100.0)
Platelets: 239 10*3/uL (ref 150–400)
RBC: 4.54 MIL/uL (ref 4.22–5.81)
RDW: 13.4 % (ref 11.5–15.5)
WBC: 8.2 10*3/uL (ref 4.0–10.5)
nRBC: 0 % (ref 0.0–0.2)

## 2021-11-08 LAB — HEMOGLOBIN AND HEMATOCRIT, BLOOD
HCT: 42.1 % (ref 39.0–52.0)
Hemoglobin: 14.6 g/dL (ref 13.0–17.0)

## 2021-11-08 LAB — BLOOD GAS, ARTERIAL
Acid-base deficit: 10 mmol/L — ABNORMAL HIGH (ref 0.0–2.0)
Allens test (pass/fail): POSITIVE — AB
Bicarbonate: 13.3 mmol/L — ABNORMAL LOW (ref 20.0–28.0)
O2 Saturation: 98.2 %
Patient temperature: 37.1
pCO2 arterial: 23 mmHg — ABNORMAL LOW (ref 32–48)
pH, Arterial: 7.37 (ref 7.35–7.45)
pO2, Arterial: 90 mmHg (ref 83–108)

## 2021-11-08 LAB — COMPREHENSIVE METABOLIC PANEL
ALT: 57 U/L — ABNORMAL HIGH (ref 0–44)
AST: 70 U/L — ABNORMAL HIGH (ref 15–41)
Albumin: 2.2 g/dL — ABNORMAL LOW (ref 3.5–5.0)
Alkaline Phosphatase: 74 U/L (ref 38–126)
Anion gap: 15 (ref 5–15)
BUN: 64 mg/dL — ABNORMAL HIGH (ref 8–23)
CO2: 12 mmol/L — ABNORMAL LOW (ref 22–32)
Calcium: 7.8 mg/dL — ABNORMAL LOW (ref 8.9–10.3)
Chloride: 106 mmol/L (ref 98–111)
Creatinine, Ser: 2.84 mg/dL — ABNORMAL HIGH (ref 0.61–1.24)
GFR, Estimated: 24 mL/min — ABNORMAL LOW (ref >60)
Glucose, Bld: 106 mg/dL — ABNORMAL HIGH (ref 70–99)
Potassium: 3 mmol/L — ABNORMAL LOW (ref 3.5–5.1)
Sodium: 133 mmol/L — ABNORMAL LOW (ref 135–145)
Total Bilirubin: 1.5 mg/dL — ABNORMAL HIGH (ref 0.3–1.2)
Total Protein: 5.9 g/dL — ABNORMAL LOW (ref 6.5–8.1)

## 2021-11-08 LAB — BASIC METABOLIC PANEL
Anion gap: 16 — ABNORMAL HIGH (ref 5–15)
BUN: 76 mg/dL — ABNORMAL HIGH (ref 8–23)
CO2: 16 mmol/L — ABNORMAL LOW (ref 22–32)
Calcium: 8.1 mg/dL — ABNORMAL LOW (ref 8.9–10.3)
Chloride: 106 mmol/L (ref 98–111)
Creatinine, Ser: 3.51 mg/dL — ABNORMAL HIGH (ref 0.61–1.24)
GFR, Estimated: 19 mL/min — ABNORMAL LOW (ref >60)
Glucose, Bld: 119 mg/dL — ABNORMAL HIGH (ref 70–99)
Potassium: 3.7 mmol/L (ref 3.5–5.1)
Sodium: 138 mmol/L (ref 135–145)

## 2021-11-08 LAB — GLUCOSE, CAPILLARY
Glucose-Capillary: 100 mg/dL — ABNORMAL HIGH (ref 70–99)
Glucose-Capillary: 103 mg/dL — ABNORMAL HIGH (ref 70–99)
Glucose-Capillary: 120 mg/dL — ABNORMAL HIGH (ref 70–99)
Glucose-Capillary: 121 mg/dL — ABNORMAL HIGH (ref 70–99)
Glucose-Capillary: 129 mg/dL — ABNORMAL HIGH (ref 70–99)
Glucose-Capillary: 93 mg/dL (ref 70–99)

## 2021-11-08 LAB — LACTIC ACID, PLASMA
Lactic Acid, Venous: 7.1 mmol/L (ref 0.5–1.9)
Lactic Acid, Venous: 5.2 mmol/L (ref 0.5–1.9)

## 2021-11-08 LAB — SURGICAL PCR SCREEN
MRSA, PCR: NEGATIVE
Staphylococcus aureus: NEGATIVE

## 2021-11-08 LAB — PROTIME-INR
INR: 1.6 — ABNORMAL HIGH (ref 0.8–1.2)
Prothrombin Time: 19 seconds — ABNORMAL HIGH (ref 11.4–15.2)

## 2021-11-08 LAB — OSMOLALITY: Osmolality: 291 mOsm/kg (ref 275–295)

## 2021-11-08 LAB — HIV ANTIBODY (ROUTINE TESTING W REFLEX): HIV Screen 4th Generation wRfx: NONREACTIVE

## 2021-11-08 MED ORDER — ONDANSETRON HCL 4 MG/2ML IJ SOLN: 4.0000 mg | Freq: Four times a day (QID) | INTRAMUSCULAR | Status: DC | PRN

## 2021-11-08 MED ORDER — NOREPINEPHRINE 4 MG/250ML-% IV SOLN
0.0000 ug/min | INTRAVENOUS | Status: DC
  Administered 2021-11-08: 2 ug/min via INTRAVENOUS

## 2021-11-08 MED ORDER — PANTOPRAZOLE 80MG IVPB - SIMPLE MED
80.0000 mg | Freq: Once | INTRAVENOUS | Status: AC
  Administered 2021-11-08: 80 mg via INTRAVENOUS
  Filled 2021-11-08: qty 80

## 2021-11-08 MED ORDER — PANTOPRAZOLE SODIUM 40 MG IV SOLR: 40.0000 mg | Freq: Two times a day (BID) | INTRAVENOUS | Status: DC

## 2021-11-08 MED ORDER — POTASSIUM CHLORIDE 10 MEQ/100ML IV SOLN
10.0000 meq | INTRAVENOUS | Status: AC
  Administered 2021-11-08 (×2): 10 meq via INTRAVENOUS
  Filled 2021-11-08 (×2): qty 100

## 2021-11-08 MED ORDER — FENTANYL CITRATE (PF) 100 MCG/2ML IJ SOLN
INTRAMUSCULAR | Status: AC
  Administered 2021-11-08: 50 ug via INTRAVENOUS
  Filled 2021-11-08: qty 2

## 2021-11-08 MED ORDER — FENTANYL CITRATE PF 50 MCG/ML IJ SOSY
25.0000 ug | PREFILLED_SYRINGE | Freq: Once | INTRAMUSCULAR | Status: AC
  Administered 2021-11-08: 25 ug via INTRAVENOUS
  Filled 2021-11-08: qty 1

## 2021-11-08 MED ORDER — VASOPRESSIN 20 UNITS/100 ML INFUSION FOR SHOCK
INTRAVENOUS | Status: AC
  Filled 2021-11-08: qty 100

## 2021-11-08 MED ORDER — SODIUM CHLORIDE 0.9 % IV SOLN
250.0000 mL | INTRAVENOUS | Status: DC
  Administered 2021-11-08 – 2021-11-23 (×2): 250 mL via INTRAVENOUS

## 2021-11-08 MED ORDER — NOREPINEPHRINE 4 MG/250ML-% IV SOLN
INTRAVENOUS | Status: AC
  Filled 2021-11-08: qty 250

## 2021-11-08 MED ORDER — LIP MEDEX EX OINT
1.0000 "application " | TOPICAL_OINTMENT | Freq: Two times a day (BID) | CUTANEOUS | Status: DC
  Administered 2021-11-09 – 2021-12-06 (×38): 1 via TOPICAL
  Filled 2021-11-08 (×4): qty 7

## 2021-11-08 MED ORDER — MAGNESIUM SULFATE 2 GM/50ML IV SOLN
INTRAVENOUS | Status: AC
  Filled 2021-11-08: qty 50

## 2021-11-08 MED ORDER — SODIUM CHLORIDE 0.9 % IV BOLUS
2000.0000 mL | Freq: Once | INTRAVENOUS | Status: AC
  Administered 2021-11-08: 2000 mL via INTRAVENOUS

## 2021-11-08 MED ORDER — FENTANYL CITRATE (PF) 100 MCG/2ML IJ SOLN
50.0000 ug | Freq: Once | INTRAMUSCULAR | Status: AC | PRN
  Administered 2021-11-08: 50 ug via INTRAVENOUS
  Filled 2021-11-08: qty 2

## 2021-11-08 MED ORDER — VASOPRESSIN 20 UNITS/100 ML INFUSION FOR SHOCK
0.0000 [IU]/min | INTRAVENOUS | Status: DC
  Administered 2021-11-08 – 2021-11-09 (×3): 0.03 [IU]/min via INTRAVENOUS
  Filled 2021-11-08 (×2): qty 100

## 2021-11-08 MED ORDER — PROCHLORPERAZINE EDISYLATE 10 MG/2ML IJ SOLN: 5.0000 mg | INTRAMUSCULAR | Status: DC | PRN

## 2021-11-08 MED ORDER — SIMETHICONE 40 MG/0.6ML PO SUSP: 80.0000 mg | Freq: Four times a day (QID) | ORAL | Status: DC | PRN

## 2021-11-08 MED ORDER — SODIUM CHLORIDE 0.9 % IV SOLN: 8.0000 mg | Freq: Four times a day (QID) | INTRAVENOUS | Status: DC | PRN

## 2021-11-08 MED ORDER — MIDAZOLAM HCL 2 MG/2ML IJ SOLN
INTRAMUSCULAR | Status: DC | PRN
  Administered 2021-11-08: 1 mg via INTRAVENOUS

## 2021-11-08 MED ORDER — IPRATROPIUM-ALBUTEROL 0.5-2.5 (3) MG/3ML IN SOLN
3.0000 mL | RESPIRATORY_TRACT | Status: DC
  Administered 2021-11-08 (×4): 3 mL via RESPIRATORY_TRACT
  Filled 2021-11-08 (×5): qty 3

## 2021-11-08 MED ORDER — SODIUM BICARBONATE 8.4 % IV SOLN
100.0000 meq | Freq: Once | INTRAVENOUS | Status: AC
  Administered 2021-11-08: 100 meq via INTRAVENOUS
  Filled 2021-11-08: qty 100

## 2021-11-08 MED ORDER — LACTATED RINGERS IV BOLUS: 1000.0000 mL | Freq: Three times a day (TID) | INTRAVENOUS | Status: AC | PRN

## 2021-11-08 MED ORDER — FENTANYL CITRATE (PF) 100 MCG/2ML IJ SOLN
50.0000 ug | INTRAMUSCULAR | Status: DC | PRN
  Administered 2021-11-08 – 2021-11-09 (×6): 50 ug via INTRAVENOUS
  Filled 2021-11-08 (×7): qty 2

## 2021-11-08 MED ORDER — DIPHENHYDRAMINE HCL 50 MG/ML IJ SOLN
25.0000 mg | Freq: Once | INTRAMUSCULAR | Status: AC | PRN
  Administered 2021-11-08: 25 mg via INTRAVENOUS
  Filled 2021-11-08: qty 1

## 2021-11-08 MED ORDER — SODIUM CHLORIDE 0.9% FLUSH
5.0000 mL | Freq: Three times a day (TID) | INTRAVENOUS | Status: DC
  Administered 2021-11-08 – 2021-11-12 (×13): 5 mL

## 2021-11-08 MED ORDER — CHLORHEXIDINE GLUCONATE CLOTH 2 % EX PADS
6.0000 | MEDICATED_PAD | Freq: Every day | CUTANEOUS | Status: DC
Start: 2021-11-08 — End: 2021-12-07
  Administered 2021-11-08 – 2021-12-07 (×27): 6 via TOPICAL

## 2021-11-08 MED ORDER — MAGIC MOUTHWASH: 15.0000 mL | Freq: Four times a day (QID) | ORAL | Status: DC | PRN

## 2021-11-08 MED ORDER — ORAL CARE MOUTH RINSE
15.0000 mL | Freq: Two times a day (BID) | OROMUCOSAL | Status: DC
  Administered 2021-11-08 – 2021-12-01 (×33): 15 mL via OROMUCOSAL

## 2021-11-08 MED ORDER — FENTANYL CITRATE (PF) 100 MCG/2ML IJ SOLN
INTRAMUSCULAR | Status: DC | PRN
  Administered 2021-11-08: 50 ug via INTRAVENOUS

## 2021-11-08 MED ORDER — MENTHOL 3 MG MT LOZG: 1.0000 | LOZENGE | OROMUCOSAL | Status: DC | PRN

## 2021-11-08 MED ORDER — PHENOL 1.4 % MT LIQD: 2.0000 | OROMUCOSAL | Status: DC | PRN

## 2021-11-08 MED ORDER — VANCOMYCIN VARIABLE DOSE PER UNSTABLE RENAL FUNCTION (PHARMACIST DOSING)
Status: DC
Start: 2021-11-08 — End: 2021-11-08

## 2021-11-08 MED ORDER — NOREPINEPHRINE 4 MG/250ML-% IV SOLN
0.0000 ug/min | INTRAVENOUS | Status: DC
  Administered 2021-11-08: 8 ug/min via INTRAVENOUS
  Administered 2021-11-09: 2 ug/min via INTRAVENOUS
  Administered 2021-11-09: 11 ug/min via INTRAVENOUS
  Administered 2021-11-09: 15 ug/min via INTRAVENOUS
  Filled 2021-11-08 (×3): qty 250

## 2021-11-08 MED ORDER — STERILE WATER FOR INJECTION IV SOLN
INTRAVENOUS | Status: DC
  Filled 2021-11-08: qty 1000

## 2021-11-08 MED ORDER — PANTOPRAZOLE INFUSION (NEW) - SIMPLE MED
8.0000 mg/h | INTRAVENOUS | Status: DC
  Administered 2021-11-08 – 2021-11-10 (×5): 8 mg/h via INTRAVENOUS
  Filled 2021-11-08: qty 80
  Filled 2021-11-08: qty 100
  Filled 2021-11-08 (×4): qty 80

## 2021-11-08 MED ORDER — VANCOMYCIN HCL 1750 MG/350ML IV SOLN
1750.0000 mg | Freq: Once | INTRAVENOUS | Status: AC
  Administered 2021-11-08: 1750 mg via INTRAVENOUS
  Filled 2021-11-08: qty 350

## 2021-11-08 MED ORDER — MAGNESIUM SULFATE 2 GM/50ML IV SOLN
2.0000 g | Freq: Once | INTRAVENOUS | Status: AC
  Administered 2021-11-08: 2 g via INTRAVENOUS

## 2021-11-08 MED ORDER — CHLORHEXIDINE GLUCONATE 0.12 % MT SOLN
15.0000 mL | Freq: Two times a day (BID) | OROMUCOSAL | Status: DC
  Administered 2021-11-08 – 2021-11-20 (×23): 15 mL via OROMUCOSAL
  Filled 2021-11-08 (×21): qty 15

## 2021-11-08 MED ORDER — DIPHENHYDRAMINE HCL 50 MG/ML IJ SOLN: 12.5000 mg | Freq: Four times a day (QID) | INTRAMUSCULAR | Status: DC | PRN

## 2021-11-08 MED ORDER — METOPROLOL TARTRATE 5 MG/5ML IV SOLN: 5.0000 mg | Freq: Four times a day (QID) | INTRAVENOUS | Status: DC | PRN

## 2021-11-08 MED ORDER — ALUM & MAG HYDROXIDE-SIMETH 200-200-20 MG/5ML PO SUSP: 30.0000 mL | Freq: Four times a day (QID) | ORAL | Status: DC | PRN

## 2021-11-08 MED ORDER — LACTATED RINGERS IV BOLUS: 500.0000 mL | Freq: Once | INTRAVENOUS | Status: DC

## 2021-11-08 MED ORDER — SODIUM CHLORIDE 0.9 % IV BOLUS
1000.0000 mL | Freq: Once | INTRAVENOUS | Status: AC
  Administered 2021-11-08: 1000 mL via INTRAVENOUS

## 2021-11-08 MED ORDER — NOREPINEPHRINE 4 MG/250ML-% IV SOLN
0.0000 ug/min | INTRAVENOUS | Status: DC
  Administered 2021-11-08: 13 ug/min via INTRAVENOUS
  Administered 2021-11-08: 9 ug/min via INTRAVENOUS
  Filled 2021-11-08 (×2): qty 250

## 2021-11-08 MED ORDER — SODIUM CHLORIDE 0.9 % IV SOLN: INTRAVENOUS | Status: DC | PRN

## 2021-11-08 MED ORDER — MIDAZOLAM HCL 2 MG/2ML IJ SOLN
INTRAMUSCULAR | Status: AC
  Filled 2021-11-08: qty 2

## 2021-11-08 NOTE — Progress Notes (Signed)
Transition of Care [facility]) Screening Note  Patient Details  Name: Robert Larson Date of Birth: 10/15/1955  Transition of Care [facility]) CM/SW Contact:    Sherie Don, LCSW Phone Number: 11/08/2021, 9:47 AM  Transition of Care Department [facility]) has reviewed patient and no TOC needs have been identified at this time. We will continue to monitor patient advancement through interdisciplinary progression rounds. If new patient transition needs arise, please place a TOC consult.

## 2021-11-08 NOTE — Progress Notes (Signed)
Pharmacy Antibiotic Note  Robert Larson is a 66 y.o. male admitted on 11/07/2021 with septic shock secondary to diverticular-pelvic abscess.  Pharmacy has been consulted for vancomycin dosing.   Scr continues to rise this AM - up to 2.84 (baseline appears to be around 1.1)  Plan: Vancomycin '1750mg'$  IV x1 Given unstable renal function, check vancomycin random level 6/6 with AM labs Re-dose vancomycin when random level < 20  Continue zosyn 3.375g IV q8 hours (extended infusion) Monitor renal function, clinical improvement, ability to narrow antibiotics  Height: 5' 6.93" (170 cm) Weight: 122.7 kg (270 lb 8.1 oz) IBW/kg (Calculated) : 65.94  Temp (24hrs), Avg:99.2 F (37.3 C), Min:97.8 F (36.6 C), Max:100.8 F (38.2 C)  Recent Labs  Lab 11/07/21 1029 11/07/21 2000 11/08/21 0158 11/08/21 0501  WBC 18.8*  --   --  8.2  CREATININE 2.14* 1.95*  --  2.84*  LATICACIDVEN  --   --  7.1* 5.2*    Estimated Creatinine Clearance: 32.9 mL/min (A) (by C-G formula based on SCr of 2.84 mg/dL (H)).    No Known Allergies  Antimicrobials this admission: Zosyn 6/4 >>  Vancomycin 6/5 >>   Dose adjustments this admission:   Microbiology results: 6/4 BCx:  6/5 MRSA PCR: not detected  Thank you for allowing pharmacy to be a part of this patient's care.  Dimple Nanas, PharmD 11/08/2021 6:43 AM

## 2021-11-08 NOTE — H&P (Signed)
NAME:  Robert Larson, MRN:  [id number], DOB:  Feb 13, 1956, LOS: 1 ADMISSION DATE:  11/07/2021, CONSULTATION DATE:  11/08/21 REFERRING MD:  Lara Mulch , CHIEF COMPLAINT:  abdominal pain  History of Present Illness:  66 yo man admitted to hospital 6/4 PM  Abdominal pain in lower abdomen for several days.   Trulicity (dulaglutide) started 2 months ago, complicated by constipation.  Last BM yesterday.  Decreased appetite, some nausea, no vomiting.   Abdominal tenderness on admission.  Leukocytosis.  CT abd revealed abscesses.  Started IV zosyn.   Blood cx ordered.   Started getting pain medication in early AM hours, due to increasing pain. Increasing distention of abd noted.    Hospitalist noted Rigors, Hypotension.  Lactic elevated at 7.1. Febrile to 100.8 3 L given, was started on vasopressors.   Tachypnea also noted.   HCO2 12 (was 20),  AG 15 ( was 11 )(not corrected for low alb) AST alt slightly elevated.  Lactic 7.1 -> 5.2   UA neg,  BL cx pending.   No uop in ICU.   CT abd:  IMPRESSION: There is no evidence of intestinal obstruction or pneumoperitoneum. There is no hydronephrosis.   There are multiple loculated fluid collections with air-fluid levels in the lower abdomen and pelvis. Largest of these collections is in the pelvic cavity measuring 11.5 x 6 x 11.7 cm. There are other smaller fluid collections in the right lower quadrant and lower abdomen slightly more to the left of midline. Findings suggest possible multiple abscesses. Inflammatory changes are more pronounced in the small bowel mesentery. Diverticulosis of colon without significant wall thickening or focal pericolic stranding. Findings suggest possible diverticulitis with multiple abscesses or inflammatory or infectious process in the distal small bowel loops with multiple abscesses. Repeat CT with intravenous and oral contrast may be considered for further characterization.   Increased density in the  lumen of the gallbladder may suggest presence of sludge and possibly tiny stones. Bilateral renal cysts. There 2 small foci of high density in the left kidney, possibly hyperdense cysts.   Lumbar spondylosis with spinal stenosis and encroachment of neural foramina at multiple levels.  Pertinent  Medical History  DM CKD HTN  Meds  Dulaglutide Metformin Asa 81  Farxiga  Metoprolol 25 q HS Fish oil  Telmisartan-HCTZ Chantix    Significant Hospital Events: Including procedures, antibiotic start and stop dates in addition to other pertinent events     Interim History / Subjective:    Objective   Blood pressure (!) 71/41, pulse 92, temperature 98.7 F (37.1 C), temperature source Oral, resp. rate (!) 38, height 5' 6.93" (1.7 m), weight 122.7 kg, SpO2 93 %.        Intake/Output Summary (Last 24 hours) at 11/08/2021 0542 Last data filed at 11/08/2021 0427 Gross per 24 hour  Intake 3443.65 ml  Output --  Net 3443.65 ml   Filed Weights   11/07/21 1800  Weight: 122.7 kg    Examination: General: Pleasant, appears uncomfortable  HENT: ncat  Lungs: wheezes  Cardiovascular: RRR no mgr  Abdomen: Distended, tender diffusely  Extremities: No edema, no tenderness.  Neuro: a and O x 3   Resolved Hospital Problem list    Assessment & Plan:  66 yo man with intraabdominal abscesses, sepsis, septic shock.   Hypotension, lactic acidosis, worsening abdominal distension and tenderness:  Abdominal abscesses.  Possibly related to subacute/acute bowel perforation.  Recent hx of constipation.  Surgical consult at bedside.   Considering  surgery.  Cont pressors, as needed.   Will need central line.  Hold off on further fluids (increasing pulmonary edema on cxr and wheeze).   AKI on CKD; no uop in ED.  Cont pressors for improved perfusion  DM; ISS  HTN: hold antiHTN meds.     Best Practice (right click and "Reselect all SmartList Selections" daily)   Diet/type: NPO DVT  prophylaxis: not indicated GI prophylaxis: PPI Lines: N/A Foley:  N/A Code Status:  full code Last date of multidisciplinary goals of care discussion '[ ]'$   Labs   CBC: Recent Labs  Lab 11/07/21 1029 11/08/21 0501  WBC 18.8* 8.2  HGB 14.9 14.4  HCT 43.1 43.5  MCV 91.7 95.8  PLT 224 751    Basic Metabolic Panel: Recent Labs  Lab 11/07/21 1029 11/07/21 2000 11/08/21 0501  NA 127* 131* 133*  K 3.6 3.5 3.0*  CL 95* 100 106  CO2 18* 20* 12*  GLUCOSE 121* 113* 106*  BUN 63* 60* 64*  CREATININE 2.14* 1.95* 2.84*  CALCIUM 9.3 8.9 7.8*   GFR: Estimated Creatinine Clearance: 32.9 mL/min (A) (by C-G formula based on SCr of 2.84 mg/dL (H)). Recent Labs  Lab 11/07/21 1029 11/08/21 0158 11/08/21 0501  WBC 18.8*  --  8.2  LATICACIDVEN  --  7.1* 5.2*    Liver Function Tests: Recent Labs  Lab 11/07/21 1029 11/08/21 0501  AST 42* 70*  ALT 33 57*  ALKPHOS 56 74  BILITOT 1.2 1.5*  PROT 7.7 5.9*  ALBUMIN 3.4* 2.2*   Recent Labs  Lab 11/07/21 1029  LIPASE 40   No results for input(s): AMMONIA in the last 168 hours.  ABG No results found for: PHART, PCO2ART, PO2ART, HCO3, TCO2, ACIDBASEDEF, O2SAT   Coagulation Profile: No results for input(s): INR, PROTIME in the last 168 hours.  Cardiac Enzymes: No results for input(s): CKTOTAL, CKMB, CKMBINDEX, TROPONINI in the last 168 hours.  HbA1C: Hemoglobin A1C  Date/Time Value Ref Range Status  09/16/2021 08:34 AM 6.1 (A) 4.0 - 5.6 % Final    CBG: Recent Labs  Lab 11/08/21 0459  GLUCAP 100*    Review of Systems:   Abdominal pain, dyspnea. Otherwise neg.   Past Medical History:  He,  has a past medical history of Acute right-sided low back pain with bilateral sciatica (03/13/2017), Contusion of left hip and thigh (02/13/2017), Osteoarthritis, Protein in urine, and Sleep apnea.   Surgical History:   Past Surgical History:  Procedure Laterality Date   REPLACEMENT TOTAL HIP W/  RESURFACING IMPLANTS     Left       Social History:   reports that he has been smoking cigarettes. He has been smoking an average of 1 pack per day. He has never used smokeless tobacco. He reports that he does not drink alcohol and does not use drugs.   Family History:  His family history includes Breast cancer in his mother; Lung cancer in his father. There is no history of Colon cancer or Rectal cancer.   Allergies No Known Allergies   Home Medications  Prior to Admission medications   Medication Sig Start Date End Date Taking? Authorizing Provider  FARXIGA 10 MG TABS tablet Take 10 mg by mouth daily.   Yes [provider]  metoprolol succinate (TOPROL-XL) 25 MG 24 hr tablet Take 1 tablet (25 mg total) by mouth at bedtime. START with 1/2 pill for one week, then increase to 1 full pill. Patient taking differently: Take 25 mg  by mouth at bedtime. 10/18/21  Yes Jeanie Sewer, NP  dapagliflozin propanediol (FARXIGA) 5 MG TABS tablet Take 1 tablet (5 mg total) by mouth daily. Patient not taking: Reported on 11/07/2021 10/14/21   Jeanie Sewer, NP  Dulaglutide (TRULICITY) 1.5 RC/1.6LA SOPN Inject 1.5 mg into the skin once a week. Start after you run out of the 0.'75mg'$  dose x 2 each week. Patient not taking: Reported on 11/07/2021 10/14/21   Jeanie Sewer, NP  metFORMIN (GLUCOPHAGE-XR) 500 MG 24 hr tablet TAKE 1 TABLET BY MOUTH EVERY DAY WITH BREAKFAST Patient not taking: Reported on 11/07/2021 10/14/21   Jeanie Sewer, NP  Omega-3 Fatty Acids (FISH OIL PO) Take 2 capsules by mouth daily. Patient not taking: Reported on 11/07/2021    [provider]  telmisartan-hydrochlorothiazide (MICARDIS HCT) 80-25 MG tablet Take 1 tablet by mouth daily. Patient not taking: Reported on 11/07/2021    [provider]  varenicline (CHANTIX) 0.5 MG tablet INCREASE TO 2 TABS IN AM, 1 TAB IN PM FOR 1-2 WEEKS, THEN 2 TABS AM AND 2 TABS PM. Patient not taking: Reported on 11/07/2021 10/08/21   Jeanie Sewer, NP      Critical care time: 45 min

## 2021-11-08 NOTE — Progress Notes (Signed)
CCM Brief Progress Note   66 yo M admitted to [facility] for sepsis due to intraabdominal abscesses, transferred to ICU this morning after development of septic shock.  Seen in follow up multiple times during day  Over the course of the day pt has required CVC placement due to increased pressor requirements (see separate procedure note).  This afternoon went for IR drain of abscess.  After return from IR More conversant. Feeling a little better but still some abdominal pain. Purulent drainage in gravity bag. Improving respiratory effort, but now is more hypoxic -- 8L SpO2 95.SBPs 120s on 8NE  .03 vaso.  CXR following CVC was suggestive of some pulm edema Repeat BMP this afternoon with improved hypokalemia after 2runs K, and worse AKI with Cr now 3.51 -- is starting to void    Septic shock due to intraabdominal abscess Acute respiratory failure with hypoxia Hx tobacco use  Hx OSA on CPAP at home  AKI on CKD  P -dc vanc -wean pressors -- turning off vaso first -Cont supplemental O2 as needed.  -nocturnal CPAP -AM CMP, CBC  -trend UOP -- holding off on foley right now as pt is starting to make urine and is voiding spontaneously     Additional critical care time: 45 min  Eliseo Gum MSN, [name] [place] 11/08/2021, 5:07 PM

## 2021-11-08 NOTE — Progress Notes (Signed)
     Patient seen and examined.  Family at bedside.  IR drain placement successful with feculent appearing output.  OK for sips, ice chips this evening.  Will follow closely.  [place], MD [facility] [place] [facility] Office: [phone]

## 2021-11-08 NOTE — Procedures (Signed)
Vascular and Interventional Radiology Procedure Note  Patient: Robert Larson DOB: Jan 25, 1956 Medical Record Number: [id number] Note Date/Time: 11/08/21 2:00 PM   Performing Physician: Michaelle Birks, MD Assistant(s): None  Diagnosis: Pelvic abscess  Procedure:  RIGHT TRANSGLUTEAL ABSCESS DRAINAGE CATHETER PLACEMENT  Anesthesia: Conscious Sedation Complications: None Estimated Blood Loss: Minimal Specimens: Sent for Gram Stain, Aerobe Culture, and Anerobe Culture  Findings:  Successful CT-guided pelvic abscess drain placement  12 F catheter.  Plan:  - Flush drain with 5 mL Normal Saline every 8 hours. - Follow up drain evaluation / sinogram in 2 week(s).  See detailed procedure note with images in PACS. The patient tolerated the procedure well without incident or complication and was returned to ICU in stable condition.    Michaelle Birks, MD [facility] Radiology   Pager. [place]

## 2021-11-08 NOTE — Progress Notes (Signed)
An USGPIV (ultrasound guided PIV) has been placed for short-term vasopressor infusion. A correctly placed ivWatch must be used when administering Vasopressors. Should this treatment be needed beyond 72 hours, central line access should be obtained.  It will be the responsibility of the bedside nurse to follow best practice to prevent extravasations.   ?

## 2021-11-08 NOTE — Significant Event (Addendum)
Patient was complaining of increasing abdominal pain.  On exam at bedside patient was having rigors blood pressure was 01/09 systolic with heart rate in the 70s.  Temperature was 99.3.  I ordered a stat bolus of normal saline 1 L and got a stat abdominal x-ray.  Also ordered lactic acid.  Abdominal x-ray did not show anything new.  Patient lactic acid came back at 7.1 and patient blood pressure was further dropping to the 32T systolic.  At this point I ordered 2 L more of normal saline bolus transferring patient to ICU.  Will discontinue patient's antihypertensives.  Will likely may need vasopressors.  Addendum -patient was started on vasopressors.  Pulmonary critical care was consulted and also general surgeon Dr. Wilburn Cornelia was notified.  Dr. Hal Hope.

## 2021-11-08 NOTE — Progress Notes (Addendum)
eLink Physician-Brief Progress Note Patient Name: Robert Larson DOB: 02/06/1956 MRN: [id number]   Date of Service  11/08/2021  HPI/Events of Note  Notified of an episode of coffee ground emesis. Seen on camera. On zooming into the cloth, it appears like dark brownish fluid that was vomited out. RN says he has had hiccups but only threw up once before and that was not bloody. No frank red blood. Unchanged belly pain and belly exam. Spoke with patient - he has never had this before, does not usually have reflux or heartburn, never had stomach / GI ulcers before. HR 90. Stable BP and otherwise fully awake and alert.  eICU Interventions  LR bolus x 1 CBC BMP LA and type/screen PPI bolus and infusion Keep NPO  Noted that IR had placed a drain today. No issues with the drain per RN  Have also asked RN to notify the surgeon about current event  Re-assess once labs return      Intervention Category Intermediate Interventions: Bleeding - evaluation and treatment with blood products  Robert Larson G Robert Larson 11/08/2021, 11:05 PM  Addendum at 11:30 pm Surgeon had been notified and recommends an NG tube to also help with belly distension Order placed   Addendum at 12:20 am Seen on camera again and d/w RN Labs still not in system, RN had reached out to lab and should be processed soon  HR 80s. No distress. The NG had been placed and confirmatory imaging is pending. MAPs in 70s and remains off pressors. Of note, RN informed me that patient overall is very congested and sounds wet so the LR bolus has not been given, will assess this from the X ray as well   Addendum at 1:45 am Labs have just resulted Two BMP results have been placed in the system at the same time, for two diff times Patient currently not on IV fluids and has positive fluid balance RN reports he is needing more O2 as well  Has mild hypotension again though not with symptoms Noted LA and bicarb  Pushing 1 amp bicarb , check VBG  Check  CVP  H/H is fine and no repeat bleeding noted but RN says he feels more short of breath and has now ended up on 10 liter o2  Get CXR (prior order was not completed so doing it now) Keep NPO   Addendum at 2 am Have also asked RN to inform us of most updated I/O but RN says he is definitely 4 liter positive if not more  Most recent BMP with worsening creatinine and bicarb and oliguria and fluid overload on RN exam I called and d/w PCCM ground team who will evaluate the patient in the ICU. Case d/w Dr Ruthann Cancer   Addendum at 3:30 am Surgeon has placed an order for a 1 liter fluid bolus D/w RN Will give 500 cc first and hold it if his o2 sat drops or he has trouble breathing He has been switched to humidified o2 and is feeling a little better He has put out around 1.5 liter brownish material from his NG tube so will give back 500 cc for now and give more if volume status allows Also notified that patient is on levophed again and has had unreliable Bps - arterial line ordered  D/w RN Bedside evaluation pending   Addendum at 6:55 am Have ordered recheck lactate and also ordered serial CBC and BMP today. Will need surgery follow up this AM as well.

## 2021-11-08 NOTE — Consult Note (Addendum)
Robert Larson [birth year]  [id number].    Requesting MD: Dr. Alfredia Ferguson Chief Complaint/Reason for Consult: intraabdominal abscesses  HPI:  Robert Larson is a 66 yo male who was presented to [facility] yesterday with worsening abdominal pain. The pain began almost 2 weeks ago and was initially associated with constipation, for which he took Miralax. The pain continued to worsen and is mostly in the lower abdomen. He says he has had very poor appetite over the last few days with minimal PO intake. He endorses chills. Labs in the ED yesterday were significant for a WBC of 18 and AKI (patient has CKD but creatinine appears to be elevated above baseline), as well as lactic acidosis. A noncon CT abd/pelvis showed a large fluid collection in the pelvis, as well as multiple smaller intraabdominal collections. The patient was discussed with the on call surgeon at [facility] and then directly admitted to the ICU at [facility] last night. General surgery was then consulted this morning at [facility]. In the last few hours the patient has become hypotensive and is now on two pressors.   The patient has not had any previous abdominal surgeries.  ROS: Review of Systems  Constitutional:  Positive for chills. Negative for fever.  Gastrointestinal:  Positive for abdominal pain, constipation and nausea.  Neurological:  Negative for loss of consciousness.   Family History  Problem Relation Age of Onset   Breast cancer Mother    Lung cancer Father    Colon cancer Neg Hx    Rectal cancer Neg Hx     Past Medical History:  Diagnosis Date   Acute right-sided low back pain with bilateral sciatica 03/13/2017   Contusion of left hip and thigh 02/13/2017   Osteoarthritis    Protein in urine    Sleep apnea     Past Surgical History:  Procedure Laterality Date   REPLACEMENT TOTAL HIP W/  RESURFACING IMPLANTS     Left     Social History:  reports that he has been smoking cigarettes. He has been  smoking an average of 1 pack per day. He has never used smokeless tobacco. He reports that he does not drink alcohol and does not use drugs.  Allergies: No Known Allergies  Facility-Administered Medications Prior to Admission  Medication Dose Route Frequency Provider Last Rate Last Admin   0.9 %  sodium chloride infusion  500 mL Intravenous Once Irene Shipper, MD       Medications Prior to Admission  Medication Sig Dispense Refill   FARXIGA 10 MG TABS tablet Take 10 mg by mouth daily.     metoprolol succinate (TOPROL-XL) 25 MG 24 hr tablet Take 1 tablet (25 mg total) by mouth at bedtime. START with 1/2 pill for one week, then increase to 1 full pill. (Patient taking differently: Take 25 mg by mouth at bedtime.) 30 tablet 2   dapagliflozin propanediol (FARXIGA) 5 MG TABS tablet Take 1 tablet (5 mg total) by mouth daily. (Patient not taking: Reported on 11/07/2021) 90 tablet 1   Dulaglutide (TRULICITY) 1.5 WU/9.8JX SOPN Inject 1.5 mg into the skin once a week. Start after you run out of the 0.'75mg'$  dose x 2 each week. (Patient not taking: Reported on 11/07/2021) 0.5 mL 2   metFORMIN (GLUCOPHAGE-XR) 500 MG 24 hr tablet TAKE 1 TABLET BY MOUTH EVERY DAY WITH BREAKFAST (Patient not taking: Reported on 11/07/2021) 90 tablet 1   Omega-3 Fatty Acids (FISH OIL PO) Take 2 capsules by  mouth daily. (Patient not taking: Reported on 11/07/2021)     telmisartan-hydrochlorothiazide (MICARDIS HCT) 80-25 MG tablet Take 1 tablet by mouth daily. (Patient not taking: Reported on 11/07/2021)     varenicline (CHANTIX) 0.5 MG tablet INCREASE TO 2 TABS IN AM, 1 TAB IN PM FOR 1-2 WEEKS, THEN 2 TABS AM AND 2 TABS PM. (Patient not taking: Reported on 11/07/2021) 90 tablet 1     Physical Exam: Blood pressure (!) 90/37, pulse 86, temperature 98.7 F (37.1 C), temperature source Oral, resp. rate (!) 40, height 5' 6.93" (1.7 m), weight 122.7 kg, SpO2 94 %. General: resting in bed, alert, appears ill Neurological: alert and oriented, no  focal deficits, cranial nerves grossly in tact HEENT: normocephalic, atraumatic, no scleral icterus CV: regular rate and rhythm, extremities warm and well-perfused Respiratory: normal work of breathing on nasal cannula, symmetric chest wall expansion Abdomen: soft, nondistended, tender to palpation across the lower abdomen. No masses or organomegaly. Psychiatric: normal mood and affect Skin: warm and dry, no jaundice, no rashes or lesions   Results for orders placed or performed during the hospital encounter of 11/07/21 (from the past 48 hour(s))  Lipase, blood     Status: None   Collection Time: 11/07/21 10:29 AM  Result Value Ref Range   Lipase 40 11 - 51 U/L    Comment: Performed at [facility], [address] [place], [place] [address]  Comprehensive metabolic panel     Status: Abnormal   Collection Time: 11/07/21 10:29 AM  Result Value Ref Range   Sodium 127 (L) 135 - 145 mmol/L   Potassium 3.6 3.5 - 5.1 mmol/L   Chloride 95 (L) 98 - 111 mmol/L   CO2 18 (L) 22 - 32 mmol/L   Glucose, Bld 121 (H) 70 - 99 mg/dL    Comment: Glucose reference range applies only to samples taken after fasting for at least 8 hours.   BUN 63 (H) 8 - 23 mg/dL   Creatinine, Ser 2.14 (H) 0.61 - 1.24 mg/dL   Calcium 9.3 8.9 - 10.3 mg/dL   Total Protein 7.7 6.5 - 8.1 g/dL   Albumin 3.4 (L) 3.5 - 5.0 g/dL   AST 42 (H) 15 - 41 U/L   ALT 33 0 - 44 U/L   Alkaline Phosphatase 56 38 - 126 U/L   Total Bilirubin 1.2 0.3 - 1.2 mg/dL   GFR, Estimated 34 (L) >60 mL/min    Comment: (NOTE) Calculated using the CKD-EPI Creatinine Equation (2021)    Anion gap 14 5 - 15    Comment: Performed at [facility], [address] [place], [place] [address]  CBC     Status: Abnormal   Collection Time: 11/07/21 10:29 AM  Result Value Ref Range   WBC 18.8 (H) 4.0 - 10.5 K/uL   RBC 4.70 4.22 - 5.81 MIL/uL   Hemoglobin 14.9 13.0 - 17.0 g/dL   HCT 43.1 39.0 - 52.0 %   MCV 91.7 80.0  - 100.0 fL   MCH 31.7 26.0 - 34.0 pg   MCHC 34.6 30.0 - 36.0 g/dL   RDW 13.2 11.5 - 15.5 %   Platelets 224 150 - 400 K/uL   nRBC 0.0 0.0 - 0.2 %    Comment: Performed at [facility], [address] [place], [place] [address]  Urinalysis, Routine w reflex microscopic     Status: Abnormal   Collection Time: 11/07/21 10:29 AM  Result Value Ref Range   Color,  Urine YELLOW YELLOW   APPearance CLEAR CLEAR   Specific Gravity, Urine 1.018 1.005 - 1.030   pH 5.5 5.0 - 8.0   Glucose, UA 250 (A) NEGATIVE mg/dL   Hgb urine dipstick SMALL (A) NEGATIVE   Bilirubin Urine NEGATIVE NEGATIVE   Ketones, ur NEGATIVE NEGATIVE mg/dL   Protein, ur 100 (A) NEGATIVE mg/dL   Nitrite NEGATIVE NEGATIVE   Leukocytes,Ua NEGATIVE NEGATIVE   RBC / HPF 6-10 0 - 5 RBC/hpf   WBC, UA 0-5 0 - 5 WBC/hpf   Squamous Epithelial / LPF 0-5 0 - 5   Mucus PRESENT    Hyaline Casts, UA PRESENT     Comment: Performed at [facility], [address], [place], [place] [address]  Basic metabolic panel     Status: Abnormal   Collection Time: 11/07/21  8:00 PM  Result Value Ref Range   Sodium 131 (L) 135 - 145 mmol/L   Potassium 3.5 3.5 - 5.1 mmol/L   Chloride 100 98 - 111 mmol/L   CO2 20 (L) 22 - 32 mmol/L   Glucose, Bld 113 (H) 70 - 99 mg/dL    Comment: Glucose reference range applies only to samples taken after fasting for at least 8 hours.   BUN 60 (H) 8 - 23 mg/dL   Creatinine, Ser 1.95 (H) 0.61 - 1.24 mg/dL   Calcium 8.9 8.9 - 10.3 mg/dL   GFR, Estimated 38 (L) >60 mL/min    Comment: (NOTE) Calculated using the CKD-EPI Creatinine Equation (2021)    Anion gap 11 5 - 15    Comment: Performed at [facility], [place] [address] [place], [place] [address]  Osmolality     Status: None   Collection Time: 11/07/21  8:00 PM  Result Value Ref Range   Osmolality 291 275 - 295 mOsm/kg    Comment: Performed at [facility], [place] [address] [place], [place]  [address]  Lactic acid, plasma     Status: Abnormal   Collection Time: 11/08/21  1:58 AM  Result Value Ref Range   Lactic Acid, Venous 7.1 (HH) 0.5 - 1.9 mmol/L    Comment: CRITICAL RESULT CALLED TO, READ BACK BY AND VERIFIED WITH: WEAVER,S. 11/08/21 '@0243'$  BY SEEL,M. Performed at [facility], [place] [address], [place], [place] [address]   Surgical PCR screen     Status: None   Collection Time: 11/08/21  2:06 AM   Specimen: Nasal Mucosa; Nasal Swab  Result Value Ref Range   MRSA, PCR NEGATIVE NEGATIVE   Staphylococcus aureus NEGATIVE NEGATIVE    Comment: (NOTE) The Xpert SA Assay (FDA approved for NASAL specimens in patients 77 years of age and older), is one component of a comprehensive surveillance program. It is not intended to diagnose infection nor to guide or monitor treatment. Performed at [facility], [place] [address], [place], [place] [address]   Glucose, capillary     Status: Abnormal   Collection Time: 11/08/21  4:59 AM  Result Value Ref Range   Glucose-Capillary 100 (H) 70 - 99 mg/dL    Comment: Glucose reference range applies only to samples taken after fasting for at least 8 hours.  Comprehensive metabolic panel     Status: Abnormal   Collection Time: 11/08/21  5:01 AM  Result Value Ref Range   Sodium 133 (L) 135 - 145 mmol/L   Potassium 3.0 (L) 3.5 - 5.1 mmol/L   Chloride 106 98 - 111 mmol/L  CO2 12 (L) 22 - 32 mmol/L   Glucose, Bld 106 (H) 70 - 99 mg/dL    Comment: Glucose reference range applies only to samples taken after fasting for at least 8 hours.   BUN 64 (H) 8 - 23 mg/dL   Creatinine, Ser 2.84 (H) 0.61 - 1.24 mg/dL   Calcium 7.8 (L) 8.9 - 10.3 mg/dL   Total Protein 5.9 (L) 6.5 - 8.1 g/dL   Albumin 2.2 (L) 3.5 - 5.0 g/dL   AST 70 (H) 15 - 41 U/L   ALT 57 (H) 0 - 44 U/L   Alkaline Phosphatase 74 38 - 126 U/L   Total Bilirubin 1.5 (H) 0.3 - 1.2 mg/dL   GFR, Estimated 24 (L) >60 mL/min    Comment: (NOTE) Calculated  using the CKD-EPI Creatinine Equation (2021)    Anion gap 15 5 - 15    Comment: Performed at [facility], [place] [address] [place], [place] [address]  CBC     Status: None   Collection Time: 11/08/21  5:01 AM  Result Value Ref Range   WBC 8.2 4.0 - 10.5 K/uL   RBC 4.54 4.22 - 5.81 MIL/uL   Hemoglobin 14.4 13.0 - 17.0 g/dL   HCT 43.5 39.0 - 52.0 %   MCV 95.8 80.0 - 100.0 fL   MCH 31.7 26.0 - 34.0 pg   MCHC 33.1 30.0 - 36.0 g/dL   RDW 13.4 11.5 - 15.5 %   Platelets 239 150 - 400 K/uL   nRBC 0.0 0.0 - 0.2 %    Comment: Performed at [facility], [place] [address] [place], [place] [address]  Lactic acid, plasma     Status: Abnormal   Collection Time: 11/08/21  5:01 AM  Result Value Ref Range   Lactic Acid, Venous 5.2 (HH) 0.5 - 1.9 mmol/L    Comment: CRITICAL VALUE NOTED.  VALUE IS CONSISTENT WITH PREVIOUSLY REPORTED AND CALLED VALUE. Performed at [facility], [place] [address] [place], [place] [address]   Blood gas, arterial     Status: Abnormal   Collection Time: 11/08/21  6:20 AM  Result Value Ref Range   Delivery systems NASAL CANNULA    pH, Arterial 7.37 7.35 - 7.45   pCO2 arterial 23 (L) 32 - 48 mmHg   pO2, Arterial 90 83 - 108 mmHg   Bicarbonate 13.3 (L) 20.0 - 28.0 mmol/L   Acid-base deficit 10.0 (H) 0.0 - 2.0 mmol/L   O2 Saturation 98.2 %   Patient temperature 37.1    Collection site RIGHT RADIAL    Allens test (pass/fail) POSITIVE (A) PASS    Comment: Performed at [facility], [place] [address], [place], [place] [address]  Hemoglobin and hematocrit, blood     Status: None   Collection Time: 11/08/21  6:48 AM  Result Value Ref Range   Hemoglobin 14.6 13.0 - 17.0 g/dL   HCT 42.1 39.0 - 52.0 %    Comment: Performed at [facility], [place] [address], [place], [place] [address]   CT Abdomen Pelvis Wo Contrast  Addendum Date: 11/07/2021   ADDENDUM REPORT: 11/07/2021 14:20 ADDENDUM:  Following should be added to the impression. There are nodules in both adrenals with density measurements of less than 10 Hounsfield units suggesting possible incidental adenomas. Electronically Signed   By: Elmer Picker M.D.   On: 11/07/2021 14:20   Result Date: 11/07/2021 CLINICAL DATA:  Pain left lower quadrant of abdomen  EXAM: CT ABDOMEN AND PELVIS WITHOUT CONTRAST TECHNIQUE: Multidetector CT imaging of the abdomen and pelvis was performed following the standard protocol without IV contrast. RADIATION DOSE REDUCTION: This exam was performed according to the departmental dose-optimization program which includes automated exposure control, adjustment of the mA and/or kV according to patient size and/or use of iterative reconstruction technique. COMPARISON:  None Available. FINDINGS: Lower chest: Small linear densities in the lower lung fields may suggest scarring or subsegmental atelectasis. Hepatobiliary: Liver measures 18 cm in length. There is mild nodularity in the liver surface. No focal abnormality is seen. There is no dilation of bile ducts. There is subtle increased density in the dependent portion of gallbladder suggesting sludge and possibly stones. There is no fluid around the gallbladder. Pancreas: Unremarkable. Spleen: Spleen measures 13.6 cm in maximum diameter. Adrenals/Urinary Tract: There is 2.6 cm nodule in the left adrenal. There is 2 cm nodule in the right adrenal. Density measurements are less than 10 Hounsfield units. There is no hydronephrosis. There are few smooth marginated low-density lesions largest measuring 6.2 cm in size in the anterior midportion of left kidney. There are 2 small high density nodules in the left adrenal. There are no renal or ureteral stones. Urinary bladder is not distended. Beam hardening artifacts are partly obscuring portions of the bladder. Stomach/Bowel: Stomach is unremarkable. Small bowel loops are not dilated. Appendix is not visualized. Scattered  diverticula are seen in colon. There is stranding in the mesenteric fat in the lower abdomen. There is 11.5 x 6 x 11.7 cm loculated fluid collection with air-fluid level in the pelvis. There is another loculated fluid collection in the right lower quadrant measuring 7 x 3.8 x 4.1 cm with air-fluid level. There is 3.9 x 2 cm fluid collection in the lower abdomen slightly more to the left of midline. Inflammatory stranding is slightly more in the small bowel mesentery rather than sigmoid colon. Vascular/Lymphatic: There are scattered arterial calcifications. Reproductive: Evaluation of prostate is limited by beam hardening artifacts from orthopedic hardware. Other: There is no ascites or pneumoperitoneum. Musculoskeletal: Degenerative changes are noted in the lumbar spine with spinal stenosis and encroachment of neural foramina at multiple levels. There is previous left hip arthroplasty. IMPRESSION: There is no evidence of intestinal obstruction or pneumoperitoneum. There is no hydronephrosis. There are multiple loculated fluid collections with air-fluid levels in the lower abdomen and pelvis. Largest of these collections is in the pelvic cavity measuring 11.5 x 6 x 11.7 cm. There are other smaller fluid collections in the right lower quadrant and lower abdomen slightly more to the left of midline. Findings suggest possible multiple abscesses. Inflammatory changes are more pronounced in the small bowel mesentery. Diverticulosis of colon without significant wall thickening or focal pericolic stranding. Findings suggest possible diverticulitis with multiple abscesses or inflammatory or infectious process in the distal small bowel loops with multiple abscesses. Repeat CT with intravenous and oral contrast may be considered for further characterization. Increased density in the lumen of the gallbladder may suggest presence of sludge and possibly tiny stones. Bilateral renal cysts. There 2 small foci of high density in the  left kidney, possibly hyperdense cysts. Lumbar spondylosis with spinal stenosis and encroachment of neural foramina at multiple levels. Other findings as described in the body of the report. Electronically Signed: By: Elmer Picker M.D. On: 11/07/2021 13:56   DG Abd 1 View  Result Date: 11/08/2021 CLINICAL DATA:  Intra-abdominal abscess and diarrhea. EXAM: ABDOMEN - 1 VIEW COMPARISON:  CT yesterday.  FINDINGS: Supine view of the central abdomen which excludes both flanks. Air filled structure in the midline pelvis likely corresponds to abscess on CT. The additional fluid collections on CT are not well seen by radiograph. Soft tissue attenuation from habitus limits assessment. IMPRESSION: Air filled structure in the midline pelvis likely corresponds to abscess with air-fluid level on yesterday's CT. The additional fluid collections on CT are not well seen by radiograph. Electronically Signed   By: Keith Rake M.D.   On: 11/08/2021 02:00   DG CHEST PORT 1 VIEW  Result Date: 11/08/2021 CLINICAL DATA:  Hypoxia, labored breathing. EXAM: PORTABLE CHEST 1 VIEW COMPARISON:  August 28, 2007 FINDINGS: The heart size and mediastinal contours are stable. The heart size enlarged. Mild increased pulmonary interstitium is identified bilaterally. Mild patchy consolidation of bilateral lung bases are noted. The visualized skeletal structures are stable. IMPRESSION: 1. Mild congestive heart failure. 2. Mild patchy consolidation of bilateral lung bases, pneumonia is not excluded. Electronically Signed   By: Abelardo Diesel M.D.   On: 11/08/2021 06:53      Assessment/Plan This is a 66 yo male presenting with septic shock secondary to multiple intraabdominal abscesses. This is presumably a diverticular abscess, although the sigmoid colon does not look particularly inflamed. I discussed with the patient percutaneous drain placement vs surgical exploration and washout, which would likely require a colostomy. Imaging also  reviewed and discussed with oncoming surgeon for today Dr. Harlow Asa. Recommend IR evaluation first for percutaneous drainage, as well as fluid resuscitation and antibiotics. If patient does not improve with percutaneous drainage, he will likely need a laparotomy and washout. Surgery will continue to follow very closely.  Level of medical decision-making: Complex   Michaelle Birks, MD [facility] General, Hepatobiliary and Pancreatic Surgery 11/08/21 7:27 AM

## 2021-11-08 NOTE — Progress Notes (Signed)
eLink Physician-Brief Progress Note Patient Name: Robert Larson DOB: 1955-06-08 MRN: [id number]   Date of Service  11/08/2021  HPI/Events of Note  Brief new admit note: 66 yr old male shifted from floor to ICU for septic shock, worsening abdominal pain from CT scan showing diverticular -pelvic abscess. Sen by surgery already. No notes yet. Got 3 lit fluids, on levophed gtt.  Septic shock AKI K low getting replaced.  Data: HCO2 12 (was 20),  AG 15 ( was 11 )(not corrected for low alb) AST alt slightly elevated.  Lactic 7.1 -> 5.2    UA neg,  BL cx pending.    CT abd:  IMPRESSION: There is no evidence of intestinal obstruction or pneumoperitoneum. There is no hydronephrosis.   There are multiple loculated fluid collections with air-fluid levels in the lower abdomen and pelvis. Largest of these collections is in the pelvic cavity measuring 11.5 x 6 x 11.7 cm. There are other smaller fluid collections in the right lower quadrant and lower abdomen slightly more to the left of midline. Findings suggest possible multiple abscesses. Inflammatory changes are more pronounced in the small bowel mesentery. Diverticulosis of colon without significant wall thickening or focal pericolic stranding. Findings suggest possible diverticulitis with multiple abscesses or inflammatory or infectious process in the distal small bowel loops with multiple abscesses. Repeat CT with intravenous and oral contrast may be considered for further characterization.   Increased density in the lumen of the gallbladder may suggest presence of sludge and possibly tiny stones. Bilateral renal cysts. There 2 small foci of high density in the left kidney, possibly  Camera: Obese, on nasal o2. Sats 93%.  Hypotensive, on levophed at 22. On nasal o2.   eICU Interventions  - continue current plan of care, s/p 3 lit.  - on zosyn. Not on Vanc. Consider Vanco-renal dosing.  - Surgery official consult note  pending. - consider Vasopressin if BP not better and bladder pressure prn for any IAH. If over 20 or LA going up to call surgery  - on SSI-goals < 180. - asp precautions.  Discussed with RN.  Get CxR and ABG for any fluid overload. On 6 lit. Bladder scan: nil Worsening metabolic acidosis. Might need bicarb gtt. Vancomycin ordered .         Intervention Category Major Interventions: Sepsis - evaluation and management;Shock - evaluation and management;Hypotension - evaluation and management Evaluation Type: New Patient Evaluation  Elmer Sow 11/08/2021, 6:06 AM

## 2021-11-08 NOTE — Progress Notes (Signed)
The events of the night were reviewed and patient was admitted last night and decompensated and be transferred to the ICU.  The patient is a 66 year old morbidly obese male with a past medical history significant for pulm 2 hypertension, diabetes mellitus type 2, CKD stage IIIa sleep apnea as well as other comorbidities who presented with abdominal pain.  His CT abdomen and pelvis on 11/07/2021 showed multiple loculated fluid collections with air-fluid levels in the lower abdomen pelvis he was initially on antibiotics but then developed rigors and hypotension.  He had to be urgently transferred to the ICU last night and general surgery has been consulted.  IR has also been consulted for intra-abdominal/pelvic drain placement.  Patient is now requiring a higher level of care and is on pressors for septic shock.  I have spoken with the critical care nurse practitioner and they are going to assume care.  TRH will sign off the case and resume care once the patient is stabilized and ready to be transferred out of the ICU.

## 2021-11-08 NOTE — Progress Notes (Signed)
Patient has experienced increasing pain overnight from previous assessment at 2000.   PT originally declining pain medication during dayshift, and beginning of the night saying that he has dealt with the pain for three days now.   PT has received two doses of IV pain medication since pain increased to 8/10 at approximately 0000. Vital signs assessment showed no dramatic change since previously taken at 2100.   GI focused assessment performed and patients abdomen appears to be slightly more taut and distended. Bowel sounds are still hypoactive. Pain is described as being across the entirety of his abdomen, and he can not differentiate where it is worse.   PT passed three pencil thin small stools per rectum which were brown in color.   New orders being placed by provider. Will continue to assess for change in status and worsening condition.

## 2021-11-08 NOTE — Procedures (Addendum)
Central Venous Catheter Insertion Procedure Note  Robert Larson  [id number]  11-11-1955  Date:11/08/21  Time:8:20 AM   Provider Performing:Robert Larson   Procedure: Insertion of Non-tunneled Central Venous [phone]) with US guidance ([id number])   Indication(s) Medication administration  Consent Risks of the procedure as well as the alternatives and risks of each were explained to the patient and/or caregiver.  Consent for the procedure was obtained and is signed in the bedside chart  Anesthesia 66mg fentanyl, '25mg'$  benadryl, 1% lidocaine   Timeout Verified patient identification, verified procedure, site/side was marked, verified correct patient position, special equipment/implants available, medications/allergies/relevant history reviewed, required imaging and test results available.  Sterile Technique Maximal sterile technique including full sterile barrier drape, hand hygiene, sterile gown, sterile gloves, mask, hair covering, sterile ultrasound probe cover (if used).  Procedure Description Area of catheter insertion was cleaned with chlorhexidine and draped in sterile fashion.  With real-time ultrasound guidance a central venous catheter was placed into the left internal jugular vein. Placement of guidewire within L IJ vessel was verified via ultrasound with two views prior to dilation of vessel and placement of central venous catheter. Nonpulsatile blood flow and easy flushing noted in all ports. The catheter was sutured in place and sterile dressing applied.  Complications/Tolerance None; patient tolerated the procedure well. Chest X-ray is ordered to verify placement for internal jugular or subclavian cannulation.     EBL Minimal  Specimen(s) None    GEliseo GumMSN, [name] LMarshfieldfor pager  11/08/2021, 8:26 AM

## 2021-11-08 NOTE — Progress Notes (Signed)
Critical lab result called in from lab.  Lactic of 7.1  Dr. Hal Hope notified of result. Awaiting further instructions/orders.

## 2021-11-08 NOTE — Consult Note (Signed)
Chief Complaint: Patient was seen in consultation today for image guided intraabd/pelvic drain placement  Chief Complaint  Patient presents with   Abdominal Pain   at the request of Michaelle Birks   Referring Physician(s): Michaelle Birks   Supervising Physician: Michaelle Birks  Patient Status: [facility] - In[facility]pt  History of Present Illness: Robert Larson is a 66 y.o. male with PMHs of HTN, DM, CKD, sleep apnea who presented to [place] on 6/4/ due to abdominal pain.  CT AP with on 6/4/ showed multiple loculated fluid collections with air-fluid levels in the lower abdomen and pelvis, patient was started on abx and hospitalized on 6/4. On 6/5, patient developed rigor and hypotension, critical care was consulted and patient is currently in [facility] ICU. General surgery was consulted for further eval and management of the intraab/pelvic fluid collection, who recommended percutaneous drain placement by IR.   IR was requested for image guided drain placement.   Patient laying in bed, in mild respiratory distress with labored, fast breathing.  RN at bedside.  Reports generalized, diffuse abdominal pain.  Denise headache, fever, chills,  cough, chest pain, nausea ,vomiting, and bleeding.    Past Medical History:  Diagnosis Date   Acute right-sided low back pain with bilateral sciatica 03/13/2017   Contusion of left hip and thigh 02/13/2017   Osteoarthritis    Protein in urine    Sleep apnea     Past Surgical History:  Procedure Laterality Date   REPLACEMENT TOTAL HIP W/  RESURFACING IMPLANTS     Left     Allergies: Patient has no known allergies.  Medications: Prior to Admission medications   Medication Sig Start Date End Date Taking? Authorizing Provider  FARXIGA 10 MG TABS tablet Take 10 mg by mouth daily.   Yes [provider]  metoprolol succinate (TOPROL-XL) 25 MG 24 hr tablet Take 1 tablet (25 mg total) by mouth at bedtime. START with 1/2 pill for one week,  then increase to 1 full pill. Patient taking differently: Take 25 mg by mouth at bedtime. 10/18/21  Yes Jeanie Sewer, NP  dapagliflozin propanediol (FARXIGA) 5 MG TABS tablet Take 1 tablet (5 mg total) by mouth daily. Patient not taking: Reported on 11/07/2021 10/14/21   Jeanie Sewer, NP  Dulaglutide (TRULICITY) 1.5 PY/1.9JK SOPN Inject 1.5 mg into the skin once a week. Start after you run out of the 0.'75mg'$  dose x 2 each week. Patient not taking: Reported on 11/07/2021 10/14/21   Jeanie Sewer, NP  metFORMIN (GLUCOPHAGE-XR) 500 MG 24 hr tablet TAKE 1 TABLET BY MOUTH EVERY DAY WITH BREAKFAST Patient not taking: Reported on 11/07/2021 10/14/21   Jeanie Sewer, NP  Omega-3 Fatty Acids (FISH OIL PO) Take 2 capsules by mouth daily. Patient not taking: Reported on 11/07/2021    [provider]  telmisartan-hydrochlorothiazide (MICARDIS HCT) 80-25 MG tablet Take 1 tablet by mouth daily. Patient not taking: Reported on 11/07/2021    [provider]  varenicline (CHANTIX) 0.5 MG tablet INCREASE TO 2 TABS IN AM, 1 TAB IN PM FOR 1-2 WEEKS, THEN 2 TABS AM AND 2 TABS PM. Patient not taking: Reported on 11/07/2021 10/08/21   Jeanie Sewer, NP     Family History  Problem Relation Age of Onset   Breast cancer Mother    Lung cancer Father    Colon cancer Neg Hx    Rectal cancer Neg Hx     Social History   Socioeconomic History   Marital status: Married  Spouse name: Not on file   Number of children: 1   Years of education: Not on file   Highest education level: Not on file  Occupational History   Occupation: body shop owner  Tobacco Use   Smoking status: Every Day    Packs/day: 1.00    Types: Cigarettes   Smokeless tobacco: Never  Vaping Use   Vaping Use: Never used  Substance and Sexual Activity   Alcohol use: No   Drug use: Never   Sexual activity: Not on file  Other Topics Concern   Not on file  Social History Narrative   Not on file   Social  Determinants of Health   Financial Resource Strain: Not on file  Food Insecurity: Not on file  Transportation Needs: Not on file  Physical Activity: Not on file  Stress: Not on file  Social Connections: Not on file     Review of Systems: A 12 point ROS discussed and pertinent positives are indicated in the HPI above.  All other systems are negative.  Vital Signs: BP 137/64 (BP Location: Right Leg)   Pulse 85   Temp 98.3 F (36.8 C) (Oral)   Resp (!) 24   Ht 5' 6.93" (1.7 m)   Wt 270 lb 8.1 oz (122.7 kg)   SpO2 95%   BMI 42.46 kg/m    Physical Exam Vitals and nursing note reviewed.  Constitutional:      General: He is in acute distress.     Appearance: He is obese. He is ill-appearing.  HENT:     Head: Normocephalic.  Cardiovascular:     Rate and Rhythm: Normal rate and regular rhythm.  Pulmonary:     Breath sounds: Wheezing present.     Comments: Labored breathing, wheezing noted on left chest. Patient is current smoker.  Abdominal:     General: Bowel sounds are normal. There is no distension.  Skin:    General: Skin is warm and dry.     Coloration: Skin is not cyanotic or jaundiced.  Neurological:     Mental Status: He is alert and oriented to person, place, and time.  Psychiatric:        Mood and Affect: Mood normal.        Behavior: Behavior normal.    MD Evaluation Airway: WNL Heart: WNL Abdomen: WNL Chest/ Lungs: WNL ASA  Classification: 3 Mallampati/Airway Score: Two  Imaging: CT Abdomen Pelvis Wo Contrast  Addendum Date: 11/07/2021   ADDENDUM REPORT: 11/07/2021 14:20 ADDENDUM: Following should be added to the impression. There are nodules in both adrenals with density measurements of less than 10 Hounsfield units suggesting possible incidental adenomas. Electronically Signed   By: Elmer Picker M.D.   On: 11/07/2021 14:20   Result Date: 11/07/2021 CLINICAL DATA:  Pain left lower quadrant of abdomen EXAM: CT ABDOMEN AND PELVIS WITHOUT CONTRAST  TECHNIQUE: Multidetector CT imaging of the abdomen and pelvis was performed following the standard protocol without IV contrast. RADIATION DOSE REDUCTION: This exam was performed according to the departmental dose-optimization program which includes automated exposure control, adjustment of the mA and/or kV according to patient size and/or use of iterative reconstruction technique. COMPARISON:  None Available. FINDINGS: Lower chest: Small linear densities in the lower lung fields may suggest scarring or subsegmental atelectasis. Hepatobiliary: Liver measures 18 cm in length. There is mild nodularity in the liver surface. No focal abnormality is seen. There is no dilation of bile ducts. There is subtle increased density in the  dependent portion of gallbladder suggesting sludge and possibly stones. There is no fluid around the gallbladder. Pancreas: Unremarkable. Spleen: Spleen measures 13.6 cm in maximum diameter. Adrenals/Urinary Tract: There is 2.6 cm nodule in the left adrenal. There is 2 cm nodule in the right adrenal. Density measurements are less than 10 Hounsfield units. There is no hydronephrosis. There are few smooth marginated low-density lesions largest measuring 6.2 cm in size in the anterior midportion of left kidney. There are 2 small high density nodules in the left adrenal. There are no renal or ureteral stones. Urinary bladder is not distended. Beam hardening artifacts are partly obscuring portions of the bladder. Stomach/Bowel: Stomach is unremarkable. Small bowel loops are not dilated. Appendix is not visualized. Scattered diverticula are seen in colon. There is stranding in the mesenteric fat in the lower abdomen. There is 11.5 x 6 x 11.7 cm loculated fluid collection with air-fluid level in the pelvis. There is another loculated fluid collection in the right lower quadrant measuring 7 x 3.8 x 4.1 cm with air-fluid level. There is 3.9 x 2 cm fluid collection in the lower abdomen slightly more to  the left of midline. Inflammatory stranding is slightly more in the small bowel mesentery rather than sigmoid colon. Vascular/Lymphatic: There are scattered arterial calcifications. Reproductive: Evaluation of prostate is limited by beam hardening artifacts from orthopedic hardware. Other: There is no ascites or pneumoperitoneum. Musculoskeletal: Degenerative changes are noted in the lumbar spine with spinal stenosis and encroachment of neural foramina at multiple levels. There is previous left hip arthroplasty. IMPRESSION: There is no evidence of intestinal obstruction or pneumoperitoneum. There is no hydronephrosis. There are multiple loculated fluid collections with air-fluid levels in the lower abdomen and pelvis. Largest of these collections is in the pelvic cavity measuring 11.5 x 6 x 11.7 cm. There are other smaller fluid collections in the right lower quadrant and lower abdomen slightly more to the left of midline. Findings suggest possible multiple abscesses. Inflammatory changes are more pronounced in the small bowel mesentery. Diverticulosis of colon without significant wall thickening or focal pericolic stranding. Findings suggest possible diverticulitis with multiple abscesses or inflammatory or infectious process in the distal small bowel loops with multiple abscesses. Repeat CT with intravenous and oral contrast may be considered for further characterization. Increased density in the lumen of the gallbladder may suggest presence of sludge and possibly tiny stones. Bilateral renal cysts. There 2 small foci of high density in the left kidney, possibly hyperdense cysts. Lumbar spondylosis with spinal stenosis and encroachment of neural foramina at multiple levels. Other findings as described in the body of the report. Electronically Signed: By: Elmer Picker M.D. On: 11/07/2021 13:56   DG Abd 1 View  Result Date: 11/08/2021 CLINICAL DATA:  Intra-abdominal abscess and diarrhea. EXAM: ABDOMEN - 1  VIEW COMPARISON:  CT yesterday. FINDINGS: Supine view of the central abdomen which excludes both flanks. Air filled structure in the midline pelvis likely corresponds to abscess on CT. The additional fluid collections on CT are not well seen by radiograph. Soft tissue attenuation from habitus limits assessment. IMPRESSION: Air filled structure in the midline pelvis likely corresponds to abscess with air-fluid level on yesterday's CT. The additional fluid collections on CT are not well seen by radiograph. Electronically Signed   By: Keith Rake M.D.   On: 11/08/2021 02:00   DG CHEST PORT 1 VIEW  Result Date: 11/08/2021 CLINICAL DATA:  Encounter for central line placement. EXAM: PORTABLE CHEST 1 VIEW COMPARISON:  AP  chest 11/08/2021 at 6:40 a.m.; chest two views 08/28/2007 FINDINGS: AP chest 11/08/2021 at 8:35 a.m. New left internal jugular central venous catheter with tip overlying the mid to central aspect of the superior vena cava, in appropriate position. Cardiac silhouette is again within normal size limits. Mediastinal contours are within normal limits. Mild bilateral lower lung interstitial thickening is unchanged. No definite pleural effusion. No pneumothorax. No acute skeletal abnormality. IMPRESSION: 1. New left internal jugular central venous catheter tip in appropriate position. 2. Likely mild interstitial pulmonary edema. Electronically Signed   By: Yvonne Kendall M.D.   On: 11/08/2021 08:45   DG CHEST PORT 1 VIEW  Result Date: 11/08/2021 CLINICAL DATA:  Hypoxia, labored breathing. EXAM: PORTABLE CHEST 1 VIEW COMPARISON:  August 28, 2007 FINDINGS: The heart size and mediastinal contours are stable. The heart size enlarged. Mild increased pulmonary interstitium is identified bilaterally. Mild patchy consolidation of bilateral lung bases are noted. The visualized skeletal structures are stable. IMPRESSION: 1. Mild congestive heart failure. 2. Mild patchy consolidation of bilateral lung bases,  pneumonia is not excluded. Electronically Signed   By: Abelardo Diesel M.D.   On: 11/08/2021 06:53    Labs:  CBC: Recent Labs    06/24/21 0000 11/07/21 1029 11/08/21 0501 11/08/21 0648  WBC 7.1 18.8* 8.2  --   HGB 15.5 14.9 14.4 14.6  HCT 45 43.1 43.5 42.1  PLT 162 224 239  --     COAGS: No results for input(s): INR, APTT in the last 8760 hours.  BMP: Recent Labs    06/24/21 0000 11/07/21 1029 11/07/21 2000 11/08/21 0501  NA 133* 127* 131* 133*  K 3.9 3.6 3.5 3.0*  CL 99 95* 100 106  CO2 25* 18* 20* 12*  GLUCOSE  --  121* 113* 106*  BUN 18 63* 60* 64*  CALCIUM 9.5 9.3 8.9 7.8*  CREATININE 1.6* 2.14* 1.95* 2.84*  GFRNONAA  --  34* 38* 24*    LIVER FUNCTION TESTS: Recent Labs    06/24/21 0000 11/07/21 1029 11/08/21 0501  BILITOT  --  1.2 1.5*  AST 35 42* 70*  ALT 24 33 57*  ALKPHOS 70 56 74  PROT  --  7.7 5.9*  ALBUMIN 3.6 3.4* 2.2*    TUMOR MARKERS: No results for input(s): AFPTM, CEA, CA199, CHROMGRNA in the last 8760 hours.  Assessment and Plan: 66 y.o. male with multiple intraabdominal/pelvic fluid collection concern for abscess, who is in need of source control.    IR was requested for image guided aspiration and drain placement.  The procedure is tentatively scheduled for today pending IR schedule.   NPO since MN CBC all w/in normal limit  VS hypotensive on pressors, tachypnea on O2 via [place]  STAT INR ordered, pending. Not on AC/AP   Patient was informed that he will be laying on his belly during the procedure as patient will require transgluteal approach.  Patient states that he should be able to tolerate the positioning as long as his pain is controlled.   Risks and benefits discussed with the patient including bleeding, infection, damage to adjacent structures, bowel perforation/fistula connection, and sepsis.  All of the patient's questions were answered, patient is agreeable to proceed. Consent signed and in chart.    Thank you for this  interesting consult.  I greatly enjoyed meeting Robert Larson and look forward to participating in their care.  A copy of this report was sent to the requesting provider on this date.  Electronically  Signed: Tera Mater, PA-C 11/08/2021, 8:59 AM   I spent a total of 20 Minutes    in face to face in clinical consultation, greater than 50% of which was counseling/coordinating care for intraabd/pelvic fluid collection management.   This chart was dictated using voice recognition software.  Despite best efforts to proofread,  errors can occur which can change the documentation meaning.

## 2021-11-09 ENCOUNTER — Inpatient Hospital Stay (HOSPITAL_COMMUNITY): Payer: Commercial Managed Care - HMO

## 2021-11-09 ENCOUNTER — Telehealth: Payer: Self-pay | Admitting: Family

## 2021-11-09 DIAGNOSIS — J9601 Acute respiratory failure with hypoxia: Secondary | ICD-10-CM

## 2021-11-09 DIAGNOSIS — A419 Sepsis, unspecified organism: Secondary | ICD-10-CM | POA: Diagnosis not present

## 2021-11-09 DIAGNOSIS — K651 Peritoneal abscess: Secondary | ICD-10-CM | POA: Diagnosis not present

## 2021-11-09 DIAGNOSIS — N179 Acute kidney failure, unspecified: Secondary | ICD-10-CM | POA: Diagnosis not present

## 2021-11-09 LAB — CBC
HCT: 41.5 % (ref 39.0–52.0)
HCT: 39.4 % (ref 39.0–52.0)
HCT: 37.7 % — ABNORMAL LOW (ref 39.0–52.0)
HCT: 37.7 % — ABNORMAL LOW (ref 39.0–52.0)
Hemoglobin: 14.1 g/dL (ref 13.0–17.0)
Hemoglobin: 13.6 g/dL (ref 13.0–17.0)
Hemoglobin: 13.1 g/dL (ref 13.0–17.0)
Hemoglobin: 12.7 g/dL — ABNORMAL LOW (ref 13.0–17.0)
MCH: 32.3 pg (ref 26.0–34.0)
MCH: 32.3 pg (ref 26.0–34.0)
MCH: 32.6 pg (ref 26.0–34.0)
MCH: 31.8 pg (ref 26.0–34.0)
MCHC: 34 g/dL (ref 30.0–36.0)
MCHC: 34.5 g/dL (ref 30.0–36.0)
MCHC: 34.7 g/dL (ref 30.0–36.0)
MCHC: 33.7 g/dL (ref 30.0–36.0)
MCV: 95 fL (ref 80.0–100.0)
MCV: 93.6 fL (ref 80.0–100.0)
MCV: 93.8 fL (ref 80.0–100.0)
MCV: 94.3 fL (ref 80.0–100.0)
Platelets: 248 K/uL (ref 150–400)
Platelets: 233 10*3/uL (ref 150–400)
Platelets: 195 10*3/uL (ref 150–400)
Platelets: 187 10*3/uL (ref 150–400)
RBC: 4.37 MIL/uL (ref 4.22–5.81)
RBC: 4.21 MIL/uL — ABNORMAL LOW (ref 4.22–5.81)
RBC: 4.02 MIL/uL — ABNORMAL LOW (ref 4.22–5.81)
RBC: 4 MIL/uL — ABNORMAL LOW (ref 4.22–5.81)
RDW: 13.9 % (ref 11.5–15.5)
RDW: 13.8 % (ref 11.5–15.5)
RDW: 14 % (ref 11.5–15.5)
RDW: 14 % (ref 11.5–15.5)
WBC: 26.1 K/uL — ABNORMAL HIGH (ref 4.0–10.5)
WBC: 24.4 10*3/uL — ABNORMAL HIGH (ref 4.0–10.5)
WBC: 16.8 10*3/uL — ABNORMAL HIGH (ref 4.0–10.5)
WBC: 16.3 10*3/uL — ABNORMAL HIGH (ref 4.0–10.5)
nRBC: 0 % (ref 0.0–0.2)
nRBC: 0 % (ref 0.0–0.2)
nRBC: 0 % (ref 0.0–0.2)
nRBC: 0 % (ref 0.0–0.2)

## 2021-11-09 LAB — BASIC METABOLIC PANEL WITH GFR
Anion gap: 21 — ABNORMAL HIGH (ref 5–15)
BUN: 80 mg/dL — ABNORMAL HIGH (ref 8–23)
CO2: 15 mmol/L — ABNORMAL LOW (ref 22–32)
Calcium: 8.3 mg/dL — ABNORMAL LOW (ref 8.9–10.3)
Chloride: 102 mmol/L (ref 98–111)
Creatinine, Ser: 3.46 mg/dL — ABNORMAL HIGH (ref 0.61–1.24)
GFR, Estimated: 19 mL/min — ABNORMAL LOW
Glucose, Bld: 118 mg/dL — ABNORMAL HIGH (ref 70–99)
Potassium: 3.8 mmol/L (ref 3.5–5.1)
Sodium: 138 mmol/L (ref 135–145)

## 2021-11-09 LAB — BLOOD GAS, ARTERIAL
Acid-base deficit: 7 mmol/L — ABNORMAL HIGH (ref 0.0–2.0)
Bicarbonate: 15.3 mmol/L — ABNORMAL LOW (ref 20.0–28.0)
Drawn by: 56037
O2 Content: 10 L/min
O2 Saturation: 96.4 %
Patient temperature: 36.7
pCO2 arterial: 23 mmHg — ABNORMAL LOW (ref 32–48)
pH, Arterial: 7.43 (ref 7.35–7.45)
pO2, Arterial: 72 mmHg — ABNORMAL LOW (ref 83–108)

## 2021-11-09 LAB — PROTIME-INR
INR: 2.1 — ABNORMAL HIGH (ref 0.8–1.2)
Prothrombin Time: 23.5 seconds — ABNORMAL HIGH (ref 11.4–15.2)

## 2021-11-09 LAB — BASIC METABOLIC PANEL
Anion gap: 16 — ABNORMAL HIGH (ref 5–15)
Anion gap: 11 (ref 5–15)
Anion gap: 11 (ref 5–15)
BUN: 97 mg/dL — ABNORMAL HIGH (ref 8–23)
BUN: 94 mg/dL — ABNORMAL HIGH (ref 8–23)
BUN: 88 mg/dL — ABNORMAL HIGH (ref 8–23)
CO2: 20 mmol/L — ABNORMAL LOW (ref 22–32)
CO2: 22 mmol/L (ref 22–32)
CO2: 21 mmol/L — ABNORMAL LOW (ref 22–32)
Calcium: 8.3 mg/dL — ABNORMAL LOW (ref 8.9–10.3)
Calcium: 8.2 mg/dL — ABNORMAL LOW (ref 8.9–10.3)
Calcium: 7.9 mg/dL — ABNORMAL LOW (ref 8.9–10.3)
Chloride: 103 mmol/L (ref 98–111)
Chloride: 107 mmol/L (ref 98–111)
Chloride: 108 mmol/L (ref 98–111)
Creatinine, Ser: 3.36 mg/dL — ABNORMAL HIGH (ref 0.61–1.24)
Creatinine, Ser: 3.06 mg/dL — ABNORMAL HIGH (ref 0.61–1.24)
Creatinine, Ser: 2.87 mg/dL — ABNORMAL HIGH (ref 0.61–1.24)
GFR, Estimated: 20 mL/min — ABNORMAL LOW (ref >60)
GFR, Estimated: 22 mL/min — ABNORMAL LOW (ref >60)
GFR, Estimated: 24 mL/min — ABNORMAL LOW (ref >60)
Glucose, Bld: 192 mg/dL — ABNORMAL HIGH (ref 70–99)
Glucose, Bld: 158 mg/dL — ABNORMAL HIGH (ref 70–99)
Glucose, Bld: 130 mg/dL — ABNORMAL HIGH (ref 70–99)
Potassium: 3.4 mmol/L — ABNORMAL LOW (ref 3.5–5.1)
Potassium: 3.4 mmol/L — ABNORMAL LOW (ref 3.5–5.1)
Potassium: 3.5 mmol/L (ref 3.5–5.1)
Sodium: 139 mmol/L (ref 135–145)
Sodium: 140 mmol/L (ref 135–145)
Sodium: 140 mmol/L (ref 135–145)

## 2021-11-09 LAB — GLUCOSE, CAPILLARY
Glucose-Capillary: 130 mg/dL — ABNORMAL HIGH (ref 70–99)
Glucose-Capillary: 131 mg/dL — ABNORMAL HIGH (ref 70–99)
Glucose-Capillary: 149 mg/dL — ABNORMAL HIGH (ref 70–99)
Glucose-Capillary: 157 mg/dL — ABNORMAL HIGH (ref 70–99)
Glucose-Capillary: 160 mg/dL — ABNORMAL HIGH (ref 70–99)
Glucose-Capillary: 184 mg/dL — ABNORMAL HIGH (ref 70–99)

## 2021-11-09 LAB — CBC WITH DIFFERENTIAL/PLATELET
Abs Immature Granulocytes: 0.34 10*3/uL — ABNORMAL HIGH (ref 0.00–0.07)
Basophils Absolute: 0 10*3/uL (ref 0.0–0.1)
Basophils Relative: 0 %
Eosinophils Absolute: 0 10*3/uL (ref 0.0–0.5)
Eosinophils Relative: 0 %
HCT: 44.3 % (ref 39.0–52.0)
Hemoglobin: 14.6 g/dL (ref 13.0–17.0)
Immature Granulocytes: 2 %
Lymphocytes Relative: 3 %
Lymphs Abs: 0.7 10*3/uL (ref 0.7–4.0)
MCH: 31.8 pg (ref 26.0–34.0)
MCHC: 33 g/dL (ref 30.0–36.0)
MCV: 96.5 fL (ref 80.0–100.0)
Monocytes Absolute: 1 10*3/uL (ref 0.1–1.0)
Monocytes Relative: 4 %
Neutro Abs: 20.8 10*3/uL — ABNORMAL HIGH (ref 1.7–7.7)
Neutrophils Relative %: 91 %
Platelets: 222 10*3/uL (ref 150–400)
RBC: 4.59 MIL/uL (ref 4.22–5.81)
RDW: 14.1 % (ref 11.5–15.5)
WBC: 22.8 10*3/uL — ABNORMAL HIGH (ref 4.0–10.5)
nRBC: 0 % (ref 0.0–0.2)

## 2021-11-09 LAB — LACTIC ACID, PLASMA
Lactic Acid, Venous: 3.5 mmol/L (ref 0.5–1.9)
Lactic Acid, Venous: 4.9 mmol/L (ref 0.5–1.9)
Lactic Acid, Venous: 5 mmol/L (ref 0.5–1.9)

## 2021-11-09 LAB — ECHOCARDIOGRAM COMPLETE
AR max vel: 3.59 cm2
AV Area VTI: 4.2 cm2
AV Area mean vel: 3.69 cm2
AV Mean grad: 10 mmHg
AV Peak grad: 20.2 mmHg
Ao pk vel: 2.25 m/s
Area-P 1/2: 3.27 cm2
Height: 66.929 in
S' Lateral: 3.3 cm
Weight: 4384.51 oz

## 2021-11-09 LAB — COMPREHENSIVE METABOLIC PANEL
ALT: 76 U/L — ABNORMAL HIGH (ref 0–44)
AST: 99 U/L — ABNORMAL HIGH (ref 15–41)
Albumin: 2.2 g/dL — ABNORMAL LOW (ref 3.5–5.0)
Alkaline Phosphatase: 42 U/L (ref 38–126)
Anion gap: 21 — ABNORMAL HIGH (ref 5–15)
BUN: 84 mg/dL — ABNORMAL HIGH (ref 8–23)
CO2: 14 mmol/L — ABNORMAL LOW (ref 22–32)
Calcium: 8.2 mg/dL — ABNORMAL LOW (ref 8.9–10.3)
Chloride: 102 mmol/L (ref 98–111)
Creatinine, Ser: 3.63 mg/dL — ABNORMAL HIGH (ref 0.61–1.24)
GFR, Estimated: 18 mL/min — ABNORMAL LOW (ref >60)
Glucose, Bld: 128 mg/dL — ABNORMAL HIGH (ref 70–99)
Potassium: 3.9 mmol/L (ref 3.5–5.1)
Sodium: 137 mmol/L (ref 135–145)
Total Bilirubin: 1.5 mg/dL — ABNORMAL HIGH (ref 0.3–1.2)
Total Protein: 6.2 g/dL — ABNORMAL LOW (ref 6.5–8.1)

## 2021-11-09 LAB — COMPREHENSIVE METABOLIC PANEL WITH GFR
ALT: 76 U/L — ABNORMAL HIGH (ref 0–44)
AST: 102 U/L — ABNORMAL HIGH (ref 15–41)
Albumin: 2.1 g/dL — ABNORMAL LOW (ref 3.5–5.0)
Alkaline Phosphatase: 40 U/L (ref 38–126)
Anion gap: 20 — ABNORMAL HIGH (ref 5–15)
BUN: 92 mg/dL — ABNORMAL HIGH (ref 8–23)
CO2: 17 mmol/L — ABNORMAL LOW (ref 22–32)
Calcium: 8.2 mg/dL — ABNORMAL LOW (ref 8.9–10.3)
Chloride: 102 mmol/L (ref 98–111)
Creatinine, Ser: 3.77 mg/dL — ABNORMAL HIGH (ref 0.61–1.24)
GFR, Estimated: 17 mL/min — ABNORMAL LOW
Glucose, Bld: 141 mg/dL — ABNORMAL HIGH (ref 70–99)
Potassium: 3.5 mmol/L (ref 3.5–5.1)
Sodium: 139 mmol/L (ref 135–145)
Total Bilirubin: 1.2 mg/dL (ref 0.3–1.2)
Total Protein: 5.9 g/dL — ABNORMAL LOW (ref 6.5–8.1)

## 2021-11-09 LAB — TYPE AND SCREEN
ABO/RH(D): O POS
Antibody Screen: NEGATIVE

## 2021-11-09 MED ORDER — MAGNESIUM SULFATE IN D5W 1-5 GM/100ML-% IV SOLN
1.0000 g | Freq: Once | INTRAVENOUS | Status: AC
  Administered 2021-11-09: 1 g via INTRAVENOUS
  Filled 2021-11-09: qty 100

## 2021-11-09 MED ORDER — FENTANYL 50 MCG/ML IV PCA SOLN
INTRAVENOUS | Status: DC
  Administered 2021-11-10: 45 ug via INTRAVENOUS
  Administered 2021-11-10: 15 ug via INTRAVENOUS
  Administered 2021-11-10: 30 ug via INTRAVENOUS
  Administered 2021-11-10 (×2): 15 ug via INTRAVENOUS
  Administered 2021-11-11 (×3): 30 ug via INTRAVENOUS
  Administered 2021-11-11: 45 ug via INTRAVENOUS
  Administered 2021-11-11 (×2): 30 ug via INTRAVENOUS
  Administered 2021-11-12: 45 ug via INTRAVENOUS
  Administered 2021-11-12: 60 ug via INTRAVENOUS
  Administered 2021-11-12: 30 ug via INTRAVENOUS
  Administered 2021-11-12 – 2021-11-13 (×4): 15 ug via INTRAVENOUS
  Administered 2021-11-13: 30 ug via INTRAVENOUS
  Filled 2021-11-09: qty 25

## 2021-11-09 MED ORDER — SODIUM CHLORIDE 0.9% FLUSH
10.0000 mL | INTRAVENOUS | Status: DC | PRN
  Administered 2021-11-09 – 2021-12-07 (×6): 10 mL

## 2021-11-09 MED ORDER — PERFLUTREN LIPID MICROSPHERE
1.0000 mL | INTRAVENOUS | Status: AC | PRN
  Administered 2021-11-09: 3 mL via INTRAVENOUS

## 2021-11-09 MED ORDER — LACTATED RINGERS IV BOLUS
500.0000 mL | Freq: Once | INTRAVENOUS | Status: AC
  Administered 2021-11-09: 500 mL via INTRAVENOUS

## 2021-11-09 MED ORDER — NALOXONE HCL 0.4 MG/ML IJ SOLN
0.4000 mg | INTRAMUSCULAR | Status: DC | PRN
Start: 2021-11-09 — End: 2021-11-14

## 2021-11-09 MED ORDER — ALTEPLASE 2 MG IJ SOLR: 2.0000 mg | Freq: Once | INTRAMUSCULAR | Status: DC

## 2021-11-09 MED ORDER — SODIUM BICARBONATE 8.4 % IV SOLN
50.0000 meq | Freq: Once | INTRAVENOUS | Status: AC
  Administered 2021-11-09: 50 meq via INTRAVENOUS
  Filled 2021-11-09: qty 50

## 2021-11-09 MED ORDER — SODIUM CHLORIDE 0.9% FLUSH
9.0000 mL | INTRAVENOUS | Status: DC | PRN
Start: 2021-11-09 — End: 2021-11-14

## 2021-11-09 MED ORDER — POTASSIUM CHLORIDE 10 MEQ/100ML IV SOLN
10.0000 meq | INTRAVENOUS | Status: AC
  Administered 2021-11-09 (×4): 10 meq via INTRAVENOUS
  Filled 2021-11-09 (×4): qty 100

## 2021-11-09 MED ORDER — LACTATED RINGERS IV BOLUS: 1000.0000 mL | Freq: Once | INTRAVENOUS | Status: DC

## 2021-11-09 MED ORDER — SODIUM CHLORIDE 0.9% FLUSH
10.0000 mL | Freq: Two times a day (BID) | INTRAVENOUS | Status: DC
  Administered 2021-11-09 (×3): 10 mL
  Administered 2021-11-10: 40 mL
  Administered 2021-11-10: 30 mL
  Administered 2021-11-11: 40 mL
  Administered 2021-11-11: 20 mL
  Administered 2021-11-14 – 2021-11-16 (×3): 10 mL
  Administered 2021-11-20: 40 mL
  Administered 2021-11-21 – 2021-12-02 (×14): 10 mL

## 2021-11-09 MED ORDER — IPRATROPIUM-ALBUTEROL 0.5-2.5 (3) MG/3ML IN SOLN
3.0000 mL | Freq: Four times a day (QID) | RESPIRATORY_TRACT | Status: DC
  Administered 2021-11-09 – 2021-11-12 (×13): 3 mL via RESPIRATORY_TRACT
  Filled 2021-11-09 (×13): qty 3

## 2021-11-09 MED ORDER — SODIUM CHLORIDE 0.9 % IV SOLN: INTRAVENOUS | Status: DC | PRN

## 2021-11-09 NOTE — Progress Notes (Addendum)
I personally saw the patient and performed a substantive portion of this encounter, including a complete performance of at least one of the key components (MDM, Hx and/or Exam), in conjunction with the Advanced Practice Provider.    66 year old man admitted with acute abdomen and found to have abdominal abscesses, septic shock, AKI Pelvic drain placed by IR 6/5 with fecal purulent drainage. Levophed weaned down to 5 mics, vasopressin turned off but then required increasing doses and back up to 15 mics this morning Increasing oxygen requirements overnight now on 12 L high flow nasal cannula. Urine output 650 cc last 12 hours Afebrile, reports abdominal pain is improved. CVP 5  On exam -tachypneic, mild accessory muscle use, distended abdomen but soft, some tenderness on left persists, no guarding , no edema or JVD.  Labs show lactate of 4.9, hypokalemia 3.5, creatinine plateauing at 3.7, increase leukocytosis ABG shows well compensated metabolic acidosis with anion gap 20.  Chest x-ray independently reviewed shows bibasal and interstitial infiltrates  Impression/plan Septic shock -appears dry by exam and CVP -We will increase fluids to 75 an hour Persistent lactate concerning but abdominal exam appears improved, defer to surgery regarding need for exploratory laparotomy -Continue Zosyn  AKI -ATN in presence of ARB, doubt compartment syndrome but will insert Foley. Maintain CVP and hydration, creatinine has plateaued , so expect to turn around.  Acute respiratory failure with hypoxia -doubt volume overload, more likely AL I -Continue high flow and monitor work of breathing, may need BiPAP  Upper GI bleed -noted 6/5, likely stress gastritis Hemoglobin stable but may be hemoconcentrated. Protonix 40 every 12 , not a candidate for EGD at this time but may need this if hemoglobin drops  Updated family and discussed with surgery. My independent critical care time was 35 minutes  Vencent Hauschild  V. Elsworth Soho MD

## 2021-11-09 NOTE — Progress Notes (Signed)
Progress Note     Subjective: Pt reports abdominal pain is actually a little better overall although he is still having pain in left side. No flatus, per RN had a small BM yesterday. Cortrak placed at some point and being used to decompress - output appears bloody. Increased pressor requirement overnight. Per IR yesterday, initial drain output was feculent.   Objective: Vital signs in last 24 hours: Temp:  [97.6 F (36.4 C)-98.8 F (37.1 C)] 97.9 F (36.6 C) (06/06 0800) Pulse Rate:  [69-100] 81 (06/06 0757) Resp:  [17-39] 28 (06/06 0757) BP: (75-212)/(38-82) 120/52 (06/06 0700) SpO2:  [89 %-98 %] 91 % (06/06 0757) FiO2 (%):  [40 %] 40 % (06/05 1207) Weight:  [124.3 kg] 124.3 kg (06/06 0000) Last BM Date : 11/08/21  Intake/Output from previous day: 06/05 0701 - 06/06 0700 In: 2127.5 [I.V.:658.8; NG/GT:100; IV Piggyback:1368.7] Out: 2850 [Urine:950; Emesis/NG output:1700; Drains:200] Intake/Output this shift: Total I/O In: 186.3 [I.V.:160.9; IV Piggyback:25.4] Out: -   PE: General: pleasant, WD, obese male Heart: regular, rate, and rhythm.   Lungs: on high flow [place], tachypneic and breathing appears slightly labored Abd: soft, TTP along left flank and lower abdomen, no peritonitis or guarding, ND, bloody drainage from small bore feeding tube, TG drain with purulent fluid Psych: A&Ox3 with an appropriate affect.    Lab Results:  Recent Labs    11/09/21 0110 11/09/21 0400  WBC 22.8* 26.1*  HGB 14.6 14.1  HCT 44.3 41.5  PLT 222 248   BMET Recent Labs    11/09/21 0110 11/09/21 0400  NA 137 139  K 3.9 3.5  CL 102 102  CO2 14* 17*  GLUCOSE 128* 141*  BUN 84* 92*  CREATININE 3.63* 3.77*  CALCIUM 8.2* 8.2*   PT/INR Recent Labs    11/08/21 0822 11/09/21 0616  LABPROT 19.0* 23.5*  INR 1.6* 2.1*   CMP     Component Value Date/Time   NA 139 11/09/2021 0400   NA 133 (A) 06/24/2021 0000   K 3.5 11/09/2021 0400   CL 102 11/09/2021 0400   CO2 17 (L)  11/09/2021 0400   GLUCOSE 141 (H) 11/09/2021 0400   BUN 92 (H) 11/09/2021 0400   BUN 18 06/24/2021 0000   CREATININE 3.77 (H) 11/09/2021 0400   CALCIUM 8.2 (L) 11/09/2021 0400   PROT 5.9 (L) 11/09/2021 0400   ALBUMIN 2.1 (L) 11/09/2021 0400   AST 102 (H) 11/09/2021 0400   ALT 76 (H) 11/09/2021 0400   ALKPHOS 40 11/09/2021 0400   BILITOT 1.2 11/09/2021 0400   GFRNONAA 17 (L) 11/09/2021 0400   GFRAA >60 02/19/2018 1123   Lipase     Component Value Date/Time   LIPASE 40 11/07/2021 1029       Studies/Results: CT Abdomen Pelvis Wo Contrast  Addendum Date: 11/07/2021   ADDENDUM REPORT: 11/07/2021 14:20 ADDENDUM: Following should be added to the impression. There are nodules in both adrenals with density measurements of less than 10 Hounsfield units suggesting possible incidental adenomas. Electronically Signed   By: Elmer Picker M.D.   On: 11/07/2021 14:20   Result Date: 11/07/2021 CLINICAL DATA:  Pain left lower quadrant of abdomen EXAM: CT ABDOMEN AND PELVIS WITHOUT CONTRAST TECHNIQUE: Multidetector CT imaging of the abdomen and pelvis was performed following the standard protocol without IV contrast. RADIATION DOSE REDUCTION: This exam was performed according to the departmental dose-optimization program which includes automated exposure control, adjustment of the mA and/or kV according to patient size and/or use  of iterative reconstruction technique. COMPARISON:  None Available. FINDINGS: Lower chest: Small linear densities in the lower lung fields may suggest scarring or subsegmental atelectasis. Hepatobiliary: Liver measures 18 cm in length. There is mild nodularity in the liver surface. No focal abnormality is seen. There is no dilation of bile ducts. There is subtle increased density in the dependent portion of gallbladder suggesting sludge and possibly stones. There is no fluid around the gallbladder. Pancreas: Unremarkable. Spleen: Spleen measures 13.6 cm in maximum diameter.  Adrenals/Urinary Tract: There is 2.6 cm nodule in the left adrenal. There is 2 cm nodule in the right adrenal. Density measurements are less than 10 Hounsfield units. There is no hydronephrosis. There are few smooth marginated low-density lesions largest measuring 6.2 cm in size in the anterior midportion of left kidney. There are 2 small high density nodules in the left adrenal. There are no renal or ureteral stones. Urinary bladder is not distended. Beam hardening artifacts are partly obscuring portions of the bladder. Stomach/Bowel: Stomach is unremarkable. Small bowel loops are not dilated. Appendix is not visualized. Scattered diverticula are seen in colon. There is stranding in the mesenteric fat in the lower abdomen. There is 11.5 x 6 x 11.7 cm loculated fluid collection with air-fluid level in the pelvis. There is another loculated fluid collection in the right lower quadrant measuring 7 x 3.8 x 4.1 cm with air-fluid level. There is 3.9 x 2 cm fluid collection in the lower abdomen slightly more to the left of midline. Inflammatory stranding is slightly more in the small bowel mesentery rather than sigmoid colon. Vascular/Lymphatic: There are scattered arterial calcifications. Reproductive: Evaluation of prostate is limited by beam hardening artifacts from orthopedic hardware. Other: There is no ascites or pneumoperitoneum. Musculoskeletal: Degenerative changes are noted in the lumbar spine with spinal stenosis and encroachment of neural foramina at multiple levels. There is previous left hip arthroplasty. IMPRESSION: There is no evidence of intestinal obstruction or pneumoperitoneum. There is no hydronephrosis. There are multiple loculated fluid collections with air-fluid levels in the lower abdomen and pelvis. Largest of these collections is in the pelvic cavity measuring 11.5 x 6 x 11.7 cm. There are other smaller fluid collections in the right lower quadrant and lower abdomen slightly more to the left of  midline. Findings suggest possible multiple abscesses. Inflammatory changes are more pronounced in the small bowel mesentery. Diverticulosis of colon without significant wall thickening or focal pericolic stranding. Findings suggest possible diverticulitis with multiple abscesses or inflammatory or infectious process in the distal small bowel loops with multiple abscesses. Repeat CT with intravenous and oral contrast may be considered for further characterization. Increased density in the lumen of the gallbladder may suggest presence of sludge and possibly tiny stones. Bilateral renal cysts. There 2 small foci of high density in the left kidney, possibly hyperdense cysts. Lumbar spondylosis with spinal stenosis and encroachment of neural foramina at multiple levels. Other findings as described in the body of the report. Electronically Signed: By: Elmer Picker M.D. On: 11/07/2021 13:56   DG Abd 1 View  Result Date: 11/09/2021 CLINICAL DATA:  Nasogastric tube placement. EXAM: ABDOMEN - 1 VIEW COMPARISON:  November 08, 2021 FINDINGS: A feeding tube is present with its radiopaque weighted tip overlying the region of the gastric antrum. This represents a new finding. Nonspecific air-filled loops of bowel are seen within the left lower quadrant. No radio-opaque calculi or other significant radiographic abnormality are identified. A percutaneous surgical drain is seen with its distal tip  overlying the midline of the pelvis. An intact left hip replacement is noted. IMPRESSION: Interval feeding tube placement and positioning, as described above. Electronically Signed   By: Virgina Norfolk M.D.   On: 11/09/2021 01:11   DG Abd 1 View  Result Date: 11/08/2021 CLINICAL DATA:  Intra-abdominal abscess and diarrhea. EXAM: ABDOMEN - 1 VIEW COMPARISON:  CT yesterday. FINDINGS: Supine view of the central abdomen which excludes both flanks. Air filled structure in the midline pelvis likely corresponds to abscess on CT. The  additional fluid collections on CT are not well seen by radiograph. Soft tissue attenuation from habitus limits assessment. IMPRESSION: Air filled structure in the midline pelvis likely corresponds to abscess with air-fluid level on yesterday's CT. The additional fluid collections on CT are not well seen by radiograph. Electronically Signed   By: Keith Rake M.D.   On: 11/08/2021 02:00   DG Chest Port 1 View  Result Date: 11/09/2021 CLINICAL DATA:  Hypoxia. EXAM: PORTABLE CHEST 1 VIEW COMPARISON:  November 08, 2021 FINDINGS: There is stable left internal jugular venous catheter positioning. Interval enteric tube placement is seen without visualization of its distal tip. The heart size and mediastinal contours are within normal limits. Mild diffusely increased interstitial lung markings are seen with mild perihilar prominence of the pulmonary vasculature. Mild bibasilar atelectasis is also noted. There is no evidence of a pleural effusion or pneumothorax. Multilevel degenerative changes are seen throughout the thoracic spine. IMPRESSION: 1. Mild pulmonary vascular congestion with increased interstitial lung markings, likely consistent with interstitial edema. 2. Mild bibasilar atelectasis. Electronically Signed   By: Virgina Norfolk M.D.   On: 11/09/2021 02:25   DG CHEST PORT 1 VIEW  Result Date: 11/08/2021 CLINICAL DATA:  Encounter for central line placement. EXAM: PORTABLE CHEST 1 VIEW COMPARISON:  AP chest 11/08/2021 at 6:40 a.m.; chest two views 08/28/2007 FINDINGS: AP chest 11/08/2021 at 8:35 a.m. New left internal jugular central venous catheter with tip overlying the mid to central aspect of the superior vena cava, in appropriate position. Cardiac silhouette is again within normal size limits. Mediastinal contours are within normal limits. Mild bilateral lower lung interstitial thickening is unchanged. No definite pleural effusion. No pneumothorax. No acute skeletal abnormality. IMPRESSION: 1. New left  internal jugular central venous catheter tip in appropriate position. 2. Likely mild interstitial pulmonary edema. Electronically Signed   By: Yvonne Kendall M.D.   On: 11/08/2021 08:45   DG CHEST PORT 1 VIEW  Result Date: 11/08/2021 CLINICAL DATA:  Hypoxia, labored breathing. EXAM: PORTABLE CHEST 1 VIEW COMPARISON:  August 28, 2007 FINDINGS: The heart size and mediastinal contours are stable. The heart size enlarged. Mild increased pulmonary interstitium is identified bilaterally. Mild patchy consolidation of bilateral lung bases are noted. The visualized skeletal structures are stable. IMPRESSION: 1. Mild congestive heart failure. 2. Mild patchy consolidation of bilateral lung bases, pneumonia is not excluded. Electronically Signed   By: Abelardo Diesel M.D.   On: 11/08/2021 06:53   CT IMAGE GUIDED DRAINAGE BY PERCUTANEOUS CATHETER  Result Date: 11/08/2021 INDICATION: Intra-abdominal and pelvic abscess.  Sepsis. EXAM: CT GUIDED RIGHT TRANSGLUTEAL APPROACH ABSCESS DRAINAGE CATHETER PLACEMENT RADIATION DOSE REDUCTION: This exam was performed according to the departmental dose-optimization program which includes automated exposure control, adjustment of the mA and/or kV according to patient size and/or use of iterative reconstruction technique. COMPARISON:  None Available. MEDICATIONS: The patient is currently admitted to the hospital and receiving intravenous antibiotics. The antibiotics were administered within an appropriate time frame prior  to the initiation of the procedure. ANESTHESIA/SEDATION: Moderate (conscious) sedation was employed during this procedure. A total of Versed 1 mg and Fentanyl 100 mcg was administered intravenously. Moderate Sedation Time: 27 minutes. The patient's level of consciousness and vital signs were monitored continuously by radiology nursing throughout the procedure under my direct supervision. CONTRAST:  None COMPLICATIONS: None immediate. PROCEDURE: Informed written consent was  obtained from the patient and/or patient's representative after a discussion of the risks, benefits and alternatives to treatment. The patient was placed prone on the CT gantry and a pre procedural CT was performed re-demonstrating the known abscess/fluid collection within the deep pelvis. The procedure was planned. A timeout was performed prior to the initiation of the procedure. The RIGHT gluteal soft tissue was prepped and draped in the usual sterile fashion. The overlying soft tissues were anesthetized with 1% lidocaine with epinephrine. Appropriate trajectory was planned with the use of a 22 gauge spinal needle. An 18 gauge trocar needle was advanced into the abscess/fluid collection and a short Amplatz super stiff wire was coiled within the collection. Appropriate positioning was confirmed with a limited CT scan. The tract was serially dilated allowing placement of a 12 Fr drainage catheter. Appropriate positioning was confirmed with a limited postprocedural CT scan. Feculent fluid was aspirated. The tube was connected to a drainage bag and sutured in place. A dressing was placed. The patient tolerated the procedure well without immediate post procedural complication. IMPRESSION: Successful CT guided placement of a 12 Fr RIGHT transgluteal approach pelvis drainage catheter, as above. Samples were sent to the laboratory as requested by the ordering clinical team. Michaelle Birks, MD [facility] Electronically Signed   By: Michaelle Birks M.D.   On: 11/08/2021 17:30    Anti-infectives: Anti-infectives (From admission, onward)    Start     Dose/Rate Route Frequency Ordered Stop   11/08/21 0730  vancomycin (VANCOREADY) IVPB 1750 mg/350 mL        1,750 mg 175 mL/hr over 120 Minutes Intravenous  Once 11/08/21 0641 11/08/21 0908   11/08/21 0641  vancomycin variable dose per unstable renal function (pharmacist dosing)  Status:  Discontinued         Does not  apply See admin instructions 11/08/21 0641 11/08/21 1138   11/07/21 2000  piperacillin-tazobactam (ZOSYN) IVPB 3.375 g        3.375 g 12.5 mL/hr over 240 Minutes Intravenous Every 8 hours 11/07/21 1921     11/07/21 1430  piperacillin-tazobactam (ZOSYN) IVPB 3.375 g        3.375 g 100 mL/hr over 30 Minutes Intravenous  Once 11/07/21 1418 11/07/21 1535        Assessment/Plan Septic shock - on neo this AM Multiple intraabdominal abscesses - ?likely diverticular - s/p IR drain placement yesterday - drainage initially more feculent, appears more purulent this AM - WBC 26 from 22, afebrile  - ttp along left and lower abdomen, no peritonitis and abdomen is soft - small bore feeding tube present and being used for decompression, having bloody appearing drainage although hgb stable this AM - ?GI bleeding, on PPI gtt - no indication for emergent surgical intervention this AM but will continue to monitor closely  FEN: NPO, continue to decompress UGI tract, IVF per CCM VTE: SCDs ID: [id number] 6/4>>  - per CCM -  AKI - Cr 3.7, continue to monitor  Acute respiratory failure with hypoxia - not currently intubated, on high flow, BiPAP per CCM OSA Morbid  obesity - BMI 43.01  LOS: 2 days   I reviewed nursing notes, Consultant IR notes, last 24 h vitals and pain scores, last 48 h intake and output, last 24 h labs and trends, last 24 h imaging results, and CCM notes . Discussed with CCM.     Norm Parcel, [facility] Surgery 11/09/2021, 8:53 AM Please see Amion for pager number during day hours 7:00am-4:30pm

## 2021-11-09 NOTE — Progress Notes (Signed)
Referring Physician(s): Clent Demark  Supervising Physician: Michaelle Birks  Patient Status:  [facility] - In-pt  Chief Complaint: pelvic abscess   Subjective: Pt reports less abd pain, though some still persists in LLQ; daughter in room, afebrile; feeding tube in place   Allergies: Patient has no known allergies.  Medications: Prior to Admission medications   Medication Sig Start Date End Date Taking? Authorizing Provider  FARXIGA 10 MG TABS tablet Take 10 mg by mouth daily.   Yes [provider]  metoprolol succinate (TOPROL-XL) 25 MG 24 hr tablet Take 1 tablet (25 mg total) by mouth at bedtime. START with 1/2 pill for one week, then increase to 1 full pill. Patient taking differently: Take 25 mg by mouth at bedtime. 10/18/21  Yes Jeanie Sewer, NP  dapagliflozin propanediol (FARXIGA) 5 MG TABS tablet Take 1 tablet (5 mg total) by mouth daily. Patient not taking: Reported on 11/07/2021 10/14/21   Jeanie Sewer, NP  Dulaglutide (TRULICITY) 1.5 ZY/2.4MG SOPN Inject 1.5 mg into the skin once a week. Start after you run out of the 0.'75mg'$  dose x 2 each week. Patient not taking: Reported on 11/07/2021 10/14/21   Jeanie Sewer, NP  metFORMIN (GLUCOPHAGE-XR) 500 MG 24 hr tablet TAKE 1 TABLET BY MOUTH EVERY DAY WITH BREAKFAST Patient not taking: Reported on 11/07/2021 10/14/21   Jeanie Sewer, NP  Omega-3 Fatty Acids (FISH OIL PO) Take 2 capsules by mouth daily. Patient not taking: Reported on 11/07/2021    [provider]  telmisartan-hydrochlorothiazide (MICARDIS HCT) 80-25 MG tablet Take 1 tablet by mouth daily. Patient not taking: Reported on 11/07/2021    [provider]  varenicline (CHANTIX) 0.5 MG tablet INCREASE TO 2 TABS IN AM, 1 TAB IN PM FOR 1-2 WEEKS, THEN 2 TABS AM AND 2 TABS PM. Patient not taking: Reported on 11/07/2021 10/08/21   Jeanie Sewer, NP     Vital Signs: BP (!) 115/54   Pulse 71   Temp 98.3 F (36.8 C) (Axillary)   Resp (!) 31    Ht 5' 6.93" (1.7 m)   Wt 274 lb 0.5 oz (124.3 kg)   SpO2 93%   BMI 43.01 kg/m   Physical Exam awake, answers questions ok; TG drain intact, output 200 cc cream colored purulent fluid; flushes ok  Imaging: CT Abdomen Pelvis Wo Contrast  Addendum Date: 11/07/2021   ADDENDUM REPORT: 11/07/2021 14:20 ADDENDUM: Following should be added to the impression. There are nodules in both adrenals with density measurements of less than 10 Hounsfield units suggesting possible incidental adenomas. Electronically Signed   By: Elmer Picker M.D.   On: 11/07/2021 14:20   Result Date: 11/07/2021 CLINICAL DATA:  Pain left lower quadrant of abdomen EXAM: CT ABDOMEN AND PELVIS WITHOUT CONTRAST TECHNIQUE: Multidetector CT imaging of the abdomen and pelvis was performed following the standard protocol without IV contrast. RADIATION DOSE REDUCTION: This exam was performed according to the departmental dose-optimization program which includes automated exposure control, adjustment of the mA and/or kV according to patient size and/or use of iterative reconstruction technique. COMPARISON:  None Available. FINDINGS: Lower chest: Small linear densities in the lower lung fields may suggest scarring or subsegmental atelectasis. Hepatobiliary: Liver measures 18 cm in length. There is mild nodularity in the liver surface. No focal abnormality is seen. There is no dilation of bile ducts. There is subtle increased density in the dependent portion of gallbladder suggesting sludge and possibly stones. There is no fluid around the gallbladder. Pancreas: Unremarkable. Spleen: Spleen  measures 13.6 cm in maximum diameter. Adrenals/Urinary Tract: There is 2.6 cm nodule in the left adrenal. There is 2 cm nodule in the right adrenal. Density measurements are less than 10 Hounsfield units. There is no hydronephrosis. There are few smooth marginated low-density lesions largest measuring 6.2 cm in size in the anterior midportion of left  kidney. There are 2 small high density nodules in the left adrenal. There are no renal or ureteral stones. Urinary bladder is not distended. Beam hardening artifacts are partly obscuring portions of the bladder. Stomach/Bowel: Stomach is unremarkable. Small bowel loops are not dilated. Appendix is not visualized. Scattered diverticula are seen in colon. There is stranding in the mesenteric fat in the lower abdomen. There is 11.5 x 6 x 11.7 cm loculated fluid collection with air-fluid level in the pelvis. There is another loculated fluid collection in the right lower quadrant measuring 7 x 3.8 x 4.1 cm with air-fluid level. There is 3.9 x 2 cm fluid collection in the lower abdomen slightly more to the left of midline. Inflammatory stranding is slightly more in the small bowel mesentery rather than sigmoid colon. Vascular/Lymphatic: There are scattered arterial calcifications. Reproductive: Evaluation of prostate is limited by beam hardening artifacts from orthopedic hardware. Other: There is no ascites or pneumoperitoneum. Musculoskeletal: Degenerative changes are noted in the lumbar spine with spinal stenosis and encroachment of neural foramina at multiple levels. There is previous left hip arthroplasty. IMPRESSION: There is no evidence of intestinal obstruction or pneumoperitoneum. There is no hydronephrosis. There are multiple loculated fluid collections with air-fluid levels in the lower abdomen and pelvis. Largest of these collections is in the pelvic cavity measuring 11.5 x 6 x 11.7 cm. There are other smaller fluid collections in the right lower quadrant and lower abdomen slightly more to the left of midline. Findings suggest possible multiple abscesses. Inflammatory changes are more pronounced in the small bowel mesentery. Diverticulosis of colon without significant wall thickening or focal pericolic stranding. Findings suggest possible diverticulitis with multiple abscesses or inflammatory or infectious  process in the distal small bowel loops with multiple abscesses. Repeat CT with intravenous and oral contrast may be considered for further characterization. Increased density in the lumen of the gallbladder may suggest presence of sludge and possibly tiny stones. Bilateral renal cysts. There 2 small foci of high density in the left kidney, possibly hyperdense cysts. Lumbar spondylosis with spinal stenosis and encroachment of neural foramina at multiple levels. Other findings as described in the body of the report. Electronically Signed: By: Elmer Picker M.D. On: 11/07/2021 13:56   DG Abd 1 View  Result Date: 11/09/2021 CLINICAL DATA:  Nasogastric tube placement. EXAM: ABDOMEN - 1 VIEW COMPARISON:  November 08, 2021 FINDINGS: A feeding tube is present with its radiopaque weighted tip overlying the region of the gastric antrum. This represents a new finding. Nonspecific air-filled loops of bowel are seen within the left lower quadrant. No radio-opaque calculi or other significant radiographic abnormality are identified. A percutaneous surgical drain is seen with its distal tip overlying the midline of the pelvis. An intact left hip replacement is noted. IMPRESSION: Interval feeding tube placement and positioning, as described above. Electronically Signed   By: Virgina Norfolk M.D.   On: 11/09/2021 01:11   DG Abd 1 View  Result Date: 11/08/2021 CLINICAL DATA:  Intra-abdominal abscess and diarrhea. EXAM: ABDOMEN - 1 VIEW COMPARISON:  CT yesterday. FINDINGS: Supine view of the central abdomen which excludes both flanks. Air filled structure in the  midline pelvis likely corresponds to abscess on CT. The additional fluid collections on CT are not well seen by radiograph. Soft tissue attenuation from habitus limits assessment. IMPRESSION: Air filled structure in the midline pelvis likely corresponds to abscess with air-fluid level on yesterday's CT. The additional fluid collections on CT are not well seen by  radiograph. Electronically Signed   By: Keith Rake M.D.   On: 11/08/2021 02:00   DG Chest Port 1 View  Result Date: 11/09/2021 CLINICAL DATA:  Hypoxia. EXAM: PORTABLE CHEST 1 VIEW COMPARISON:  November 08, 2021 FINDINGS: There is stable left internal jugular venous catheter positioning. Interval enteric tube placement is seen without visualization of its distal tip. The heart size and mediastinal contours are within normal limits. Mild diffusely increased interstitial lung markings are seen with mild perihilar prominence of the pulmonary vasculature. Mild bibasilar atelectasis is also noted. There is no evidence of a pleural effusion or pneumothorax. Multilevel degenerative changes are seen throughout the thoracic spine. IMPRESSION: 1. Mild pulmonary vascular congestion with increased interstitial lung markings, likely consistent with interstitial edema. 2. Mild bibasilar atelectasis. Electronically Signed   By: Virgina Norfolk M.D.   On: 11/09/2021 02:25   DG CHEST PORT 1 VIEW  Result Date: 11/08/2021 CLINICAL DATA:  Encounter for central line placement. EXAM: PORTABLE CHEST 1 VIEW COMPARISON:  AP chest 11/08/2021 at 6:40 a.m.; chest two views 08/28/2007 FINDINGS: AP chest 11/08/2021 at 8:35 a.m. New left internal jugular central venous catheter with tip overlying the mid to central aspect of the superior vena cava, in appropriate position. Cardiac silhouette is again within normal size limits. Mediastinal contours are within normal limits. Mild bilateral lower lung interstitial thickening is unchanged. No definite pleural effusion. No pneumothorax. No acute skeletal abnormality. IMPRESSION: 1. New left internal jugular central venous catheter tip in appropriate position. 2. Likely mild interstitial pulmonary edema. Electronically Signed   By: Yvonne Kendall M.D.   On: 11/08/2021 08:45   DG CHEST PORT 1 VIEW  Result Date: 11/08/2021 CLINICAL DATA:  Hypoxia, labored breathing. EXAM: PORTABLE CHEST 1 VIEW  COMPARISON:  August 28, 2007 FINDINGS: The heart size and mediastinal contours are stable. The heart size enlarged. Mild increased pulmonary interstitium is identified bilaterally. Mild patchy consolidation of bilateral lung bases are noted. The visualized skeletal structures are stable. IMPRESSION: 1. Mild congestive heart failure. 2. Mild patchy consolidation of bilateral lung bases, pneumonia is not excluded. Electronically Signed   By: Abelardo Diesel M.D.   On: 11/08/2021 06:53   ECHOCARDIOGRAM COMPLETE  Result Date: 11/09/2021    ECHOCARDIOGRAM REPORT   Patient Name:   MENA LIENAU Date of Exam: 11/09/2021 Medical Rec #:  [id number]       Height:       66.9 in Accession #:    [id number]      Weight:       274.0 lb Date of Birth:  [birth year]      BSA:          2.310 m Patient Age:    70 years        BP:           131/58 mmHg Patient Gender: M               HR:           71 bpm. Exam Location:  Inpatient Procedure: 2D Echo, Cardiac Doppler, Color Doppler and Intracardiac            Opacification  Agent Indications:    CHF  History:        Patient has no prior history of Echocardiogram examinations.  Sonographer:    Joette Catching RCS Referring Phys: [id number] [place]  1. Left ventricular ejection fraction, by estimation, is 60 to 65%. The left ventricle has normal function. The left ventricle has no regional wall motion abnormalities. There is mild concentric left ventricular hypertrophy. Left ventricular diastolic parameters were normal.  2. Right ventricular systolic function is normal. The right ventricular size is mildly enlarged. Tricuspid regurgitation signal is inadequate for assessing PA pressure.  3. The mitral valve is grossly normal. Trivial mitral valve regurgitation. No evidence of mitral stenosis.  4. The aortic valve is tricuspid. There is mild calcification of the aortic valve. Aortic valve regurgitation is not visualized. Aortic valve sclerosis/calcification is present,  without any evidence of aortic stenosis.  5. There is mild dilatation of the ascending aorta, measuring 42 mm.  6. The inferior vena cava is dilated in size with >50% respiratory variability, suggesting right atrial pressure of 8 mmHg. FINDINGS  Left Ventricle: Left ventricular ejection fraction, by estimation, is 60 to 65%. The left ventricle has normal function. The left ventricle has no regional wall motion abnormalities. Definity contrast agent was given IV to delineate the left ventricular  endocardial borders. The left ventricular internal cavity size was normal in size. There is mild concentric left ventricular hypertrophy. Left ventricular diastolic parameters were normal. Right Ventricle: The right ventricular size is mildly enlarged. No increase in right ventricular wall thickness. Right ventricular systolic function is normal. Tricuspid regurgitation signal is inadequate for assessing PA pressure. Left Atrium: Left atrial size was normal in size. Right Atrium: Right atrial size was normal in size. Pericardium: Trivial pericardial effusion is present. Mitral Valve: The mitral valve is grossly normal. Trivial mitral valve regurgitation. No evidence of mitral valve stenosis. Tricuspid Valve: The tricuspid valve is grossly normal. Tricuspid valve regurgitation is trivial. No evidence of tricuspid stenosis. Aortic Valve: The aortic valve is tricuspid. There is mild calcification of the aortic valve. Aortic valve regurgitation is not visualized. Aortic valve sclerosis/calcification is present, without any evidence of aortic stenosis. Aortic valve mean gradient measures 10.0 mmHg. Aortic valve peak gradient measures 20.2 mmHg. Aortic valve area, by VTI measures 4.20 cm. Pulmonic Valve: The pulmonic valve was grossly normal. Pulmonic valve regurgitation is not visualized. No evidence of pulmonic stenosis. Aorta: The aortic root is normal in size and structure. There is mild dilatation of the ascending aorta,  measuring 42 mm. Venous: The inferior vena cava is dilated in size with greater than 50% respiratory variability, suggesting right atrial pressure of 8 mmHg. IAS/Shunts: The atrial septum is grossly normal.  LEFT VENTRICLE PLAX 2D LVIDd:         4.50 cm   Diastology LVIDs:         3.30 cm   LV e' medial:    7.83 cm/s LV PW:         1.20 cm   LV E/e' medial:  10.0 LV IVS:        1.40 cm   LV e' lateral:   12.20 cm/s LVOT diam:     2.30 cm   LV E/e' lateral: 6.4 LV SV:         160 LV SV Index:   69 LVOT Area:     4.15 cm  RIGHT VENTRICLE             IVC  RV Basal diam:  4.70 cm     IVC diam: 2.30 cm RV Mid diam:    4.20 cm RV S prime:     18.60 cm/s TAPSE (M-mode): 2.8 cm LEFT ATRIUM           Index        RIGHT ATRIUM           Index LA diam:      3.40 cm 1.47 cm/m   RA Area:     19.10 cm LA Vol (A2C): 54.2 ml 23.46 ml/m  RA Volume:   57.00 ml  24.68 ml/m LA Vol (A4C): 48.1 ml 20.82 ml/m  AORTIC VALVE                     PULMONIC VALVE AV Area (Vmax):    3.59 cm      PV Vmax:       1.38 m/s AV Area (Vmean):   3.69 cm      PV Peak grad:  7.6 mmHg AV Area (VTI):     4.20 cm AV Vmax:           224.50 cm/s AV Vmean:          148.500 cm/s AV VTI:            0.381 m AV Peak Grad:      20.2 mmHg AV Mean Grad:      10.0 mmHg LVOT Vmax:         194.00 cm/s LVOT Vmean:        132.000 cm/s LVOT VTI:          0.385 m LVOT/AV VTI ratio: 1.01  AORTA Ao Root diam: 3.80 cm Ao Asc diam:  4.20 cm MITRAL VALVE MV Area (PHT): 3.27 cm    SHUNTS MV Decel Time: 232 msec    Systemic VTI:  0.38 m MV E velocity: 78.30 cm/s  Systemic Diam: 2.30 cm MV A velocity: 88.10 cm/s MV E/A ratio:  0.89 Eleonore Chiquito MD Electronically signed by Eleonore Chiquito MD Signature Date/Time: 11/09/2021/12:26:45 PM    Final    CT IMAGE GUIDED DRAINAGE BY PERCUTANEOUS CATHETER  Result Date: 11/08/2021 INDICATION: Intra-abdominal and pelvic abscess.  Sepsis. EXAM: CT GUIDED RIGHT TRANSGLUTEAL APPROACH ABSCESS DRAINAGE CATHETER PLACEMENT RADIATION DOSE  REDUCTION: This exam was performed according to the departmental dose-optimization program which includes automated exposure control, adjustment of the mA and/or kV according to patient size and/or use of iterative reconstruction technique. COMPARISON:  None Available. MEDICATIONS: The patient is currently admitted to the hospital and receiving intravenous antibiotics. The antibiotics were administered within an appropriate time frame prior to the initiation of the procedure. ANESTHESIA/SEDATION: Moderate (conscious) sedation was employed during this procedure. A total of Versed 1 mg and Fentanyl 100 mcg was administered intravenously. Moderate Sedation Time: 27 minutes. The patient's level of consciousness and vital signs were monitored continuously by radiology nursing throughout the procedure under my direct supervision. CONTRAST:  None COMPLICATIONS: None immediate. PROCEDURE: Informed written consent was obtained from the patient and/or patient's representative after a discussion of the risks, benefits and alternatives to treatment. The patient was placed prone on the CT gantry and a pre procedural CT was performed re-demonstrating the known abscess/fluid collection within the deep pelvis. The procedure was planned. A timeout was performed prior to the initiation of the procedure. The RIGHT gluteal soft tissue was prepped and draped in the usual sterile fashion. The overlying soft tissues were anesthetized  with 1% lidocaine with epinephrine. Appropriate trajectory was planned with the use of a 22 gauge spinal needle. An 18 gauge trocar needle was advanced into the abscess/fluid collection and a short Amplatz super stiff wire was coiled within the collection. Appropriate positioning was confirmed with a limited CT scan. The tract was serially dilated allowing placement of a 12 Fr drainage catheter. Appropriate positioning was confirmed with a limited postprocedural CT scan. Feculent fluid was aspirated. The tube  was connected to a drainage bag and sutured in place. A dressing was placed. The patient tolerated the procedure well without immediate post procedural complication. IMPRESSION: Successful CT guided placement of a 12 Fr RIGHT transgluteal approach pelvis drainage catheter, as above. Samples were sent to the laboratory as requested by the ordering clinical team. Michaelle Birks, MD [facility] Electronically Signed   By: Michaelle Birks M.D.   On: 11/08/2021 17:30    Labs:  CBC: Recent Labs    11/08/21 0501 11/08/21 0648 11/09/21 0110 11/09/21 0400 11/09/21 1030  WBC 8.2  --  22.8* 26.1* 24.4*  HGB 14.4 14.6 14.6 14.1 13.6  HCT 43.5 42.1 44.3 41.5 39.4  PLT 239  --  222 248 233    COAGS: Recent Labs    11/08/21 0822 11/09/21 0616  INR 1.6* 2.1*    BMP: Recent Labs    11/08/21 2345 11/09/21 0110 11/09/21 0400 11/09/21 1030  NA 138 137 139 139  K 3.8 3.9 3.5 3.4*  CL 102 102 102 103  CO2 15* 14* 17* 20*  GLUCOSE 118* 128* 141* 192*  BUN 80* 84* 92* 97*  CALCIUM 8.3* 8.2* 8.2* 8.3*  CREATININE 3.46* 3.63* 3.77* 3.36*  GFRNONAA 19* 18* 17* 20*    LIVER FUNCTION TESTS: Recent Labs    11/07/21 1029 11/08/21 0501 11/09/21 0110 11/09/21 0400  BILITOT 1.2 1.5* 1.5* 1.2  AST 42* 70* 99* 102*  ALT 33 57* 76* 76*  ALKPHOS 56 74 42 40  PROT 7.7 5.9* 6.2* 5.9*  ALBUMIN 3.4* 2.2* 2.2* 2.1*    Assessment and Plan: Pt with hx septic shock, mult intraabd/pelvic abscesses; s/p right TG/pelvic drain placement yesterday(12 fr to bag); WBC 24.4(26.1), HGB 13.6(14.1), creat 3.36; drain fluid cx pending; CXR with interstitial edema  Plan: Continue TID flushes of drain with 5 cc NS. Record output Q shift. Dressing changes QD or PRN if soiled.  Call IR APP or on call IR MD if difficulty flushing or sudden change in drain output.  Repeat imaging/possible drain injection once output < 10 mL/QD (excluding flush  material.)  Discharge planning: Please contact IR APP or on call IR MD prior to patient d/c to ensure appropriate follow up plans are in place. Typically patient will follow up with IR clinic 10-14 days post d/c for repeat imaging/possible drain injection. IR scheduler will contact patient with date/time of appointment. Patient will need to flush drain QD with 5 cc NS, record output QD, dressing changes every 2-3 days or earlier if soiled.   IR will continue to follow - please call with questions or concerns.  Will need drain injection before removal  Electronically Signed: D. Rowe Robert, PA-C 11/09/2021, 3:59 PM   I spent a total of 15 Minutes at the the patient's bedside AND on the patient's hospital floor or unit, greater than 50% of which was counseling/coordinating care for pelvic abscess drain    Patient ID: Robert Larson, male   DOB: Feb 12, 1956, 66 y.o.  MRN: [id number]

## 2021-11-09 NOTE — Progress Notes (Signed)
RT attempted Aline x3 was not successful, PT declined anymore attempts. RN made aware.

## 2021-11-09 NOTE — Progress Notes (Signed)
  2D Echocardiogram has been performed.  Robert Larson 11/09/2021, 11:30 AM

## 2021-11-09 NOTE — Telephone Encounter (Signed)
FYI--Pt's daughter, Francis Gaines, called in to make sure PCP was notified of pt's recent admission on 11/07/21. States pt is in ICU and is septic.

## 2021-11-09 NOTE — Progress Notes (Signed)
CCM brief interval note  Weaning pressors well over course of the day, off vaso and now on low dose NE.  Has had variable abd pain -- at times difficult to control and at times more tolerable after fentanyl.   -will try fentanyl PCA -- without basal.  -PRN narcan ordered    Eliseo Gum MSN, [name] [place] 11/09/2021, 4:58 PM

## 2021-11-09 NOTE — Progress Notes (Signed)
Patient experienced episode of brown, coffee colored emesis. Denies nausea but complains of abdominal pain. E-link and Surgeon have been notified.

## 2021-11-09 NOTE — Progress Notes (Signed)
Chaplain met family in the waiting room and provided support and listening as they discussed how to best support Elvis's healing.    [address] [place] Pager, [phone]

## 2021-11-09 NOTE — Progress Notes (Signed)
NAME:  Robert Larson, MRN:  [id number], DOB:  1955-09-09, LOS: 2 ADMISSION DATE:  11/07/2021, CONSULTATION DATE:  11/08/21 REFERRING MD:  Lara Mulch , CHIEF COMPLAINT:  abdominal pain  History of Present Illness:  66 yo man admitted to hospital 6/4 PM  Abdominal pain in lower abdomen for several days.   Trulicity (dulaglutide) started 2 months ago, complicated by constipation.  Last BM yesterday.  Decreased appetite, some nausea, no vomiting.   Abdominal tenderness on admission.  Leukocytosis.  CT abd revealed abscesses.  Started IV zosyn.   Blood cx ordered.   Started getting pain medication in early AM hours, due to increasing pain. Increasing distention of abd noted.    Hospitalist noted Rigors, Hypotension.  Lactic elevated at 7.1. Febrile to 100.8 3 L given, was started on vasopressors.   Tachypnea also noted.   HCO2 12 (was 20),  AG 15 ( was 11 )(not corrected for low alb) AST alt slightly elevated.  Lactic 7.1 -> 5.2   UA neg,  BL cx pending.   No uop in ICU.   CT abd:  IMPRESSION: There is no evidence of intestinal obstruction or pneumoperitoneum. There is no hydronephrosis.   There are multiple loculated fluid collections with air-fluid levels in the lower abdomen and pelvis. Largest of these collections is in the pelvic cavity measuring 11.5 x 6 x 11.7 cm. There are other smaller fluid collections in the right lower quadrant and lower abdomen slightly more to the left of midline. Findings suggest possible multiple abscesses. Inflammatory changes are more pronounced in the small bowel mesentery. Diverticulosis of colon without significant wall thickening or focal pericolic stranding. Findings suggest possible diverticulitis with multiple abscesses or inflammatory or infectious process in the distal small bowel loops with multiple abscesses. Repeat CT with intravenous and oral contrast may be considered for further characterization.   Increased density in the  lumen of the gallbladder may suggest presence of sludge and possibly tiny stones. Bilateral renal cysts. There 2 small foci of high density in the left kidney, possibly hyperdense cysts.   Lumbar spondylosis with spinal stenosis and encroachment of neural foramina at multiple levels.  Pertinent  Medical History  DM CKD HTN  Meds  Dulaglutide Metformin Asa 81  Farxiga  Metoprolol 25 q HS Fish oil  Telmisartan-HCTZ Chantix    Significant Hospital Events: Including procedures, antibiotic start and stop dates in addition to other pertinent events   6/4 admit to [facility] sev sepsis due to intraabd abscess 6/5 transfer to ICU, septic shock. Worse AKI. IR perc drain of one of the abscesses. Shock improved  6/6 high volume Ngt output. Worse shock worse AKI   Interim History / Subjective:  Overnight had high volume NGT output -- about '1700mg'$  dark red/brown  Worse shock overnight. UOP has also decr. O2 req incr   Art line attempted multiple times without success overnight, further attempts declined by pt   Objective   Blood pressure (!) 120/44, pulse 73, temperature 98.1 F (36.7 C), temperature source Oral, resp. rate (!) 35, height 5' 6.93" (1.7 m), weight 124.3 kg, SpO2 93 %. CVP:  [8 mmHg] 8 mmHg  FiO2 (%):  [40 %] 40 %   Intake/Output Summary (Last 24 hours) at 11/09/2021 0653 Last data filed at 11/09/2021 7867 Gross per 24 hour  Intake 2141.3 ml  Output 2850 ml  Net -708.7 ml   Filed Weights   11/07/21 1800 11/09/21 0000  Weight: 122.7 kg 124.3 kg  Examination: General: Critically ill obese adult M  HENT: small bore NGT. Anicteric sclera. Dry mm  Lungs: Increased RR. Shallow respirations. Diminished but clear  Cardiovascular: rrr s1s2 no rgm. Cap refill <3 sec  Abdomen: soft. L > R tenderness. Drain with purulent output  Extremities: no acute joint deformity, no cyanosis or clubbing  Neuro: AAOx3 following commands    Resolved Hospital Problem list     Assessment & Plan:   Septic shock due to intraabdominal abscesses  -multiple abscesses -- largest was 11.5 x 6x 11.7 -- now s/p IR drain 6/5 P -NE and vaso for MAP > 65. Has now declined art line.  -cont zosyn  -serial abdominal exam -appreciate CCS following -- no surgical intervention right now -follow cx data  Acute respiratory failure with hypoxemia  OSA on CPAP  -component of pulm vasc congestion but CXR looks better than O2 req would suggest. ?inflammatory changes in setting of septic shock P -IS, flutter -wean O2 as able  -qHS CPAP ordered but will need to weigh this depending on abd eam   AKI on CKD  -probably ATN  P -will place foley 6/6 for strict UOP  -trend renal indices -minimize nephrotoxic agents   Transaminitis Coagulopathy  -in setting of septic shock  P -trend   Lactic acidosis AGMA P -supportive care   GIB  -cont small bore to [facility] -- working ok right now but might need a larger bore NGT  -PPI gtt -GI consult, though in current state to not think he is actionable from GI procedural standpoint  -serial H/H   Hx HTN- holding antihypertensives in setting of shock Hx prediabetes - SSI  Best Practice (right click and "Reselect all SmartList Selections" daily)   Diet/type: NPO DVT prophylaxis: not indicated GI prophylaxis: PPI Lines: Central line Foley:  N/A-- order placed for insertion 6/6  Code Status:  full code Last date of multidisciplinary goals of care discussion [family updated 6/5 at bedside ]  Labs   CBC: Recent Labs  Lab 11/07/21 1029 11/08/21 0501 11/08/21 0648 11/09/21 0110 11/09/21 0400  WBC 18.8* 8.2  --  22.8* 26.1*  NEUTROABS  --   --   --  20.8*  --   HGB 14.9 14.4 14.6 14.6 14.1  HCT 43.1 43.5 42.1 44.3 41.5  MCV 91.7 95.8  --  96.5 95.0  PLT 224 239  --  222 638    Basic Metabolic Panel: Recent Labs  Lab 11/08/21 0501 11/08/21 1505 11/08/21 2345 11/09/21 0110 11/09/21 0400  NA 133* 138 138 137 139   K 3.0* 3.7 3.8 3.9 3.5  CL 106 106 102 102 102  CO2 12* 16* 15* 14* 17*  GLUCOSE 106* 119* 118* 128* 141*  BUN 64* 76* 80* 84* 92*  CREATININE 2.84* 3.51* 3.46* 3.63* 3.77*  CALCIUM 7.8* 8.1* 8.3* 8.2* 8.2*   GFR: Estimated Creatinine Clearance: 25 mL/min (A) (by C-G formula based on SCr of 3.77 mg/dL (H)). Recent Labs  Lab 11/07/21 1029 11/08/21 0158 11/08/21 0501 11/08/21 2345 11/09/21 0110 11/09/21 0246 11/09/21 0400  WBC 18.8*  --  8.2  --  22.8*  --  26.1*  LATICACIDVEN  --  7.1* 5.2* 5.0*  --  4.9*  --     Liver Function Tests: Recent Labs  Lab 11/07/21 1029 11/08/21 0501 11/09/21 0110 11/09/21 0400  AST 42* 70* 99* 102*  ALT 33 57* 76* 76*  ALKPHOS 56 74 42 40  BILITOT 1.2 1.5* 1.5* 1.2  PROT 7.7 5.9* 6.2* 5.9*  ALBUMIN 3.4* 2.2* 2.2* 2.1*   Recent Labs  Lab 11/07/21 1029  LIPASE 40   No results for input(s): AMMONIA in the last 168 hours.  ABG    Component Value Date/Time   PHART 7.43 11/09/2021 0250   PCO2ART 23 (L) 11/09/2021 0250   PO2ART 72 (L) 11/09/2021 0250   HCO3 15.3 (L) 11/09/2021 0250   ACIDBASEDEF 7.0 (H) 11/09/2021 0250   O2SAT 96.4 11/09/2021 0250     Coagulation Profile: Recent Labs  Lab 11/08/21 0822 11/09/21 0616  INR 1.6* 2.1*    Cardiac Enzymes: No results for input(s): CKTOTAL, CKMB, CKMBINDEX, TROPONINI in the last 168 hours.  HbA1C: Hemoglobin A1C  Date/Time Value Ref Range Status  09/16/2021 08:34 AM 6.1 (A) 4.0 - 5.6 % Final    CBG: Recent Labs  Lab 11/08/21 1149 11/08/21 1611 11/08/21 2001 11/08/21 2330 11/09/21 0331  GLUCAP 103* 120* 121* 129* 149*    CRITICAL CARE Performed by: Cristal Generous   Total critical care time: 40 minutes  Critical care time was exclusive of separately billable procedures and treating other patients. Critical care was necessary to treat or prevent imminent or life-threatening deterioration.  Critical care was time spent personally by me on the following  activities: development of treatment plan with patient and/or surrogate as well as nursing, discussions with consultants, evaluation of patient's response to treatment, examination of patient, obtaining history from patient or surrogate, ordering and performing treatments and interventions, ordering and review of laboratory studies, ordering and review of radiographic studies, pulse oximetry and re-evaluation of patient's condition.  Eliseo Gum MSN, [name] [place] for pager  11/09/2021, 6:53 AM

## 2021-11-09 NOTE — Progress Notes (Incomplete)
{  Select_TRH_Note:[id number]} 

## 2021-11-09 NOTE — Progress Notes (Signed)
Cross covering ICU physician.   Called by [facility] physician to eval pt at bedside 2/2 resp distress  Upon eval pt appears relatively comfortable. Endorses improvement in abdominal pain symptoms with small bore ng placement and decompression of ~1772m fluid that is dark red/brown. Pt denies resp symptoms but is mildly tachypneic with rr in low 20's. RN reports his resp status has improved since abdominal decompression as well however he does remain hypoxic req ~8-10L to maintain sats in mid 90's. Lungs are diminished but no rales or rhonchi appreciated  HR in 70's to 80's, BP has declined and pt has been placed back on levophed based on R forearm cuff pressures. [facility]   Hgb stable despite ng output repeat pending for am as well.  Lactate is elevated at 5, wbc is elevated from presentation to 23, concern for worsening sepsis rather than GI bleed in light of this. He has drain in place with cloudy but non-bloody drainage noted in pouch and cx pending (prelim negative) and already on gram neg and anaerobic coverage. INR at presentation 1.6 with worsening lft will recheck this am.   Unable to get ct with contrast 2/2 worsening renal function.   Will also order echo for cardiac eval  Pt will need GI consultation in am as well.   At this time there is no acute need for intubation but pt does remain at high risk.  RN updated at bedside

## 2021-11-10 ENCOUNTER — Inpatient Hospital Stay (HOSPITAL_COMMUNITY): Payer: Commercial Managed Care - HMO

## 2021-11-10 DIAGNOSIS — K651 Peritoneal abscess: Secondary | ICD-10-CM | POA: Diagnosis not present

## 2021-11-10 DIAGNOSIS — A419 Sepsis, unspecified organism: Secondary | ICD-10-CM | POA: Diagnosis not present

## 2021-11-10 DIAGNOSIS — N179 Acute kidney failure, unspecified: Secondary | ICD-10-CM | POA: Diagnosis not present

## 2021-11-10 DIAGNOSIS — N189 Chronic kidney disease, unspecified: Secondary | ICD-10-CM | POA: Diagnosis not present

## 2021-11-10 LAB — GLUCOSE, CAPILLARY
Glucose-Capillary: 108 mg/dL — ABNORMAL HIGH (ref 70–99)
Glucose-Capillary: 108 mg/dL — ABNORMAL HIGH (ref 70–99)
Glucose-Capillary: 108 mg/dL — ABNORMAL HIGH (ref 70–99)
Glucose-Capillary: 110 mg/dL — ABNORMAL HIGH (ref 70–99)
Glucose-Capillary: 115 mg/dL — ABNORMAL HIGH (ref 70–99)
Glucose-Capillary: 133 mg/dL — ABNORMAL HIGH (ref 70–99)

## 2021-11-10 LAB — CBC
HCT: 35 % — ABNORMAL LOW (ref 39.0–52.0)
Hemoglobin: 11.7 g/dL — ABNORMAL LOW (ref 13.0–17.0)
MCH: 32.3 pg (ref 26.0–34.0)
MCHC: 33.4 g/dL (ref 30.0–36.0)
MCV: 96.7 fL (ref 80.0–100.0)
Platelets: 168 10*3/uL (ref 150–400)
RBC: 3.62 MIL/uL — ABNORMAL LOW (ref 4.22–5.81)
RDW: 14.1 % (ref 11.5–15.5)
WBC: 15.4 10*3/uL — ABNORMAL HIGH (ref 4.0–10.5)
nRBC: 0 % (ref 0.0–0.2)

## 2021-11-10 LAB — BASIC METABOLIC PANEL
Anion gap: 10 (ref 5–15)
BUN: 90 mg/dL — ABNORMAL HIGH (ref 8–23)
CO2: 22 mmol/L (ref 22–32)
Calcium: 8.2 mg/dL — ABNORMAL LOW (ref 8.9–10.3)
Chloride: 109 mmol/L (ref 98–111)
Creatinine, Ser: 2.58 mg/dL — ABNORMAL HIGH (ref 0.61–1.24)
GFR, Estimated: 27 mL/min — ABNORMAL LOW (ref >60)
Glucose, Bld: 132 mg/dL — ABNORMAL HIGH (ref 70–99)
Potassium: 3.5 mmol/L (ref 3.5–5.1)
Sodium: 141 mmol/L (ref 135–145)

## 2021-11-10 LAB — MAGNESIUM: Magnesium: 3 mg/dL — ABNORMAL HIGH (ref 1.7–2.4)

## 2021-11-10 MED ORDER — DILTIAZEM HCL-DEXTROSE 125-5 MG/125ML-% IV SOLN (PREMIX)
5.0000 mg/h | INTRAVENOUS | Status: DC
  Administered 2021-11-10: 5 mg/h via INTRAVENOUS
  Administered 2021-11-11 – 2021-11-13 (×6): 15 mg/h via INTRAVENOUS
  Filled 2021-11-10 (×7): qty 125

## 2021-11-10 NOTE — Telephone Encounter (Signed)
Spoke with pt daughter, She gave a verbal understanding. Daughter stated we should be receiving FMLA paperwork some time soon. I let her know I will be on the lookout for it.

## 2021-11-10 NOTE — Progress Notes (Signed)
Pt refused cpap tonight.  Home unit remains at bedside should pt change his mind.

## 2021-11-10 NOTE — Progress Notes (Addendum)
NAME:  Robert Larson, MRN:  [id number], DOB:  Jun 10, 1955, LOS: 3 ADMISSION DATE:  11/07/2021, CONSULTATION DATE:  11/08/21 REFERRING MD:  Lara Mulch , CHIEF COMPLAINT:  abdominal pain  History of Present Illness:  66 yo man admitted to hospital 6/4 PM  Abdominal pain in lower abdomen for several days.   Trulicity (dulaglutide) started 2 months ago, complicated by constipation.  Last BM yesterday.   Admitted to [facility] for severe sepsis due to intraabdominal abscesses. Transferred to ICU 6/5 for septic shock. IR drain 6/5. Worse AKI 6/5, 6/6.   CT abd:  IMPRESSION: There is no evidence of intestinal obstruction or pneumoperitoneum. There is no hydronephrosis.   There are multiple loculated fluid collections with air-fluid levels in the lower abdomen and pelvis. Largest of these collections is in the pelvic cavity measuring 11.5 x 6 x 11.7 cm. There are other smaller fluid collections in the right lower quadrant and lower abdomen slightly more to the left of midline. Findings suggest possible multiple abscesses. Inflammatory changes are more pronounced in the small bowel mesentery. Diverticulosis of colon without significant wall thickening or focal pericolic stranding. Findings suggest possible diverticulitis with multiple abscesses or inflammatory or infectious process in the distal small bowel loops with multiple abscesses. Repeat CT with intravenous and oral contrast may be considered for further characterization.   Increased density in the lumen of the gallbladder may suggest presence of sludge and possibly tiny stones. Bilateral renal cysts. There 2 small foci of high density in the left kidney, possibly hyperdense cysts.   Lumbar spondylosis with spinal stenosis and encroachment of neural foramina at multiple levels.  Pertinent  Medical History  DM CKD HTN  Meds  Dulaglutide Metformin Asa 81  Farxiga  Metoprolol 25 q HS Fish oil  Telmisartan-HCTZ Chantix     Significant Hospital Events: Including procedures, antibiotic start and stop dates in addition to other pertinent events   6/4 admit to [facility] sev sepsis due to intraabd abscess 6/5 transfer to ICU, septic shock. Worse AKI. IR perc drain of one of the abscesses. Shock improved  6/6 high volume Ngt output. Worse shock worse AKI  6/7 off pressors. Dc foley. CXR interval worsening but weaning HFNC down to 5  Interim History / Subjective:    Cr down to 2.58  UOP averaging  ml/kg/hr -- much improved   HFNC weaned to 5 from 10   Objective   Blood pressure 134/67, pulse 63, temperature 98.4 F (36.9 C), temperature source Axillary, resp. rate (!) 23, height 5' 6.93" (1.7 m), weight 124.3 kg, SpO2 91 %. CVP:  [4 mmHg-8 mmHg] 4 mmHg  FiO2 (%):  [40 %] 40 %   Intake/Output Summary (Last 24 hours) at 11/10/2021 1007 Last data filed at 11/10/2021 0903 Gross per 24 hour  Intake 1434.81 ml  Output 1725 ml  Net -290.19 ml   Filed Weights   11/07/21 1800 11/09/21 0000  Weight: 122.7 kg 124.3 kg    Examination: General: Critically ill adult M NAD  HENT: Small bore NGT. HHFNC. EtCO2. Anicteric sclera  Lungs: Even unlabored respirations. Slight tachypnea. Diminished bibasilar sounds  Cardiovascular: rrr s1s2 no rgm cap refill brisk 2+ radial and dp pulses bilat  Abdomen: Soft. L > R tenderness. Drain with low volume purulent output. Normoactive bowel sounds  Extremities: No acute joint deformity no cyanosis or clubbing  Neuro: AAOx4 following commands Psych: good insight. Slightly depressed    Resolved Hospital Problem list    [place]  Assessment & Plan:   Septic shock due to intraabdominal abscess - improved shock  -multiple abscesses -- largest was 11.5 x 6x 11.7 -- now s/p IR drain 6/5 P -off pressors  -cont zosyn, following cx data  -serial abdominal exam  Acute hypoxic respiratory failure  Hx OSA  -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 Multifactorial -- ? HCAP, ALI due to septic  shock, pulm edema. P -IS, flutter -wean O2 as able  -qHS CPAP ordered but will need to weigh this depending on abd eam   AKI on CKD -- improving AKI  -probably ATN  P -dc foley  6/7 -- has had dramatic improvement in UOP and Cr is improving -trend UOP, renal indices -- will space out metabolic panels to qD   Transaminitis Coagulopathy  -in setting of septic shock  P -trend PRN   Acute anemia - critical illness, possibly compounded by a small amount of blood loss due to possible GIB as iatrogenic blood loss from fq labs  GIB  -cont NGT LIMW (has a small bore NGT right now but seems to be functioning appropriately for decompression)  -cont PPI gtt -GI consult this admit, not urgent  -PPI gtt -trend H/H -- can decr fq to qD at this point   Hx HTN- holding antihypertensives in setting of shock Hx prediabetes - SSI  Best Practice (right click and "Reselect all SmartList Selections" daily)   Diet/type: NPO DVT prophylaxis: not indicated GI prophylaxis: PPI Lines: Central line Foley:  order to dc 6/7  Code Status:  full code Last date of multidisciplinary goals of care discussion [family updated 6/6 at bedside ]  Labs   CBC: Recent Labs  Lab 11/09/21 0110 11/09/21 0400 11/09/21 1030 11/09/21 1708 11/09/21 2150 11/10/21 0250  WBC 22.8* 26.1* 24.4* 16.8* 16.3* 15.4*  NEUTROABS 20.8*  --   --   --   --   --   HGB 14.6 14.1 13.6 13.1 12.7* 11.7*  HCT 44.3 41.5 39.4 37.7* 37.7* 35.0*  MCV 96.5 95.0 93.6 93.8 94.3 96.7  PLT 222 248 233 195 187 638    Basic Metabolic Panel: Recent Labs  Lab 11/09/21 0400 11/09/21 1030 11/09/21 1708 11/09/21 2150 11/10/21 0250  NA 139 139 140 140 141  K 3.5 3.4* 3.4* 3.5 3.5  CL 102 103 107 108 109  CO2 17* 20* 22 21* 22  GLUCOSE 141* 192* 158* 130* 132*  BUN 92* 97* 94* 88* 90*  CREATININE 3.77* 3.36* 3.06* 2.87* 2.58*  CALCIUM 8.2* 8.3* 8.2* 7.9* 8.2*   GFR: Estimated Creatinine Clearance: 36.5 mL/min (A) (by C-G formula  based on SCr of 2.58 mg/dL (H)). Recent Labs  Lab 11/08/21 0501 11/08/21 2345 11/09/21 0110 11/09/21 0246 11/09/21 0400 11/09/21 0730 11/09/21 1030 11/09/21 1708 11/09/21 2150 11/10/21 0250  WBC 8.2  --    < >  --    < >  --  24.4* 16.8* 16.3* 15.4*  LATICACIDVEN 5.2* 5.0*  --  4.9*  --  3.5*  --   --   --   --    < > = values in this interval not displayed.    Liver Function Tests: Recent Labs  Lab 11/07/21 1029 11/08/21 0501 11/09/21 0110 11/09/21 0400  AST 42* 70* 99* 102*  ALT 33 57* 76* 76*  ALKPHOS 56 74 42 40  BILITOT 1.2 1.5* 1.5* 1.2  PROT 7.7 5.9* 6.2* 5.9*  ALBUMIN 3.4* 2.2* 2.2* 2.1*   Recent Labs  Lab  11/07/21 1029  LIPASE 40   No results for input(s): AMMONIA in the last 168 hours.  ABG    Component Value Date/Time   PHART 7.43 11/09/2021 0250   PCO2ART 23 (L) 11/09/2021 0250   PO2ART 72 (L) 11/09/2021 0250   HCO3 15.3 (L) 11/09/2021 0250   ACIDBASEDEF 7.0 (H) 11/09/2021 0250   O2SAT 96.4 11/09/2021 0250     Coagulation Profile: Recent Labs  Lab 11/08/21 0822 11/09/21 0616  INR 1.6* 2.1*    Cardiac Enzymes: No results for input(s): CKTOTAL, CKMB, CKMBINDEX, TROPONINI in the last 168 hours.  HbA1C: Hemoglobin A1C  Date/Time Value Ref Range Status  09/16/2021 08:34 AM 6.1 (A) 4.0 - 5.6 % Final    CBG: Recent Labs  Lab 11/09/21 1553 11/09/21 2035 11/09/21 2332 11/10/21 0353 11/10/21 0854  GLUCAP 160* 130* 131* 108* 133*    CRITICAL CARE Performed by: Cristal Generous   Total critical care time: 42 minutes  Critical care time was exclusive of separately billable procedures and treating other patients. Critical care was necessary to treat or prevent imminent or life-threatening deterioration.  Critical care was time spent personally by me on the following activities: development of treatment plan with patient and/or surrogate as well as nursing, discussions with consultants, evaluation of patient's response to treatment,  examination of patient, obtaining history from patient or surrogate, ordering and performing treatments and interventions, ordering and review of laboratory studies, ordering and review of radiographic studies, pulse oximetry and re-evaluation of patient's condition.  Eliseo Gum MSN, [name] [place] for pager  11/10/2021, 10:07 AM

## 2021-11-10 NOTE — Progress Notes (Signed)
ANTICOAGULATION CONSULT NOTE - Initial Consult  Pharmacy Consult for Heparin Indication: atrial fibrillation  No Known Allergies  Patient Measurements: Height: 5' 6.93" (170 cm) Weight: 124.3 kg (274 lb 0.5 oz) IBW/kg (Calculated) : 65.94 HEPARIN DW (KG): 94.5   Vital Signs: Temp: 98.3 F (36.8 C) (06/07 2000) Temp Source: Oral (06/07 2000) BP: 157/82 (06/07 2300) Pulse Rate: 123 (06/07 2300)  Labs: Recent Labs    11/08/21 0822 11/08/21 1505 11/09/21 0616 11/09/21 1030 11/09/21 1708 11/09/21 2150 11/10/21 0250  HGB  --    < >  --    < > 13.1 12.7* 11.7*  HCT  --    < >  --    < > 37.7* 37.7* 35.0*  PLT  --    < >  --    < > 195 187 168  LABPROT 19.0*  --  23.5*  --   --   --   --   INR 1.6*  --  2.1*  --   --   --   --   CREATININE  --    < >  --    < > 3.06* 2.87* 2.58*   < > = values in this interval not displayed.    Estimated Creatinine Clearance: 36.5 mL/min (A) (by C-G formula based on SCr of 2.58 mg/dL (H)).   Medical History: Past Medical History:  Diagnosis Date   Acute right-sided low back pain with bilateral sciatica 03/13/2017   Contusion of left hip and thigh 02/13/2017   Osteoarthritis    Protein in urine    Sleep apnea     Medications:  Infusions:   sodium chloride 75 mL/hr at 11/10/21 1719   sodium chloride 250 mL (11/08/21 0653)   sodium chloride Stopped (11/09/21 1144)   sodium chloride 10 mL/hr at 11/10/21 2353   [START ON 11/11/2021] diltiazem (CARDIZEM) infusion 5 mg/hr (11/10/21 2353)   ondansetron (ZOFRAN) IV     pantoprazole 8 mg/hr (11/10/21 2353)   piperacillin-tazobactam (ZOSYN)  IV Stopped (11/10/21 2334)    Assessment: 66 yo M with new onset Afib.  Pharmacy consulted to initiate IV heparin.   He was admitted with sepsis secondary to intra-peritoneal abscess on IV antibiotics and s/p drain placement on 6/5 on IV antibiotics. Not on anticoagulation PTA.   Baseline labs- CBC:Hg/pltc trending down. Hg slightly low at 11.7, pltc  at low end of normal at 168.  Noted anemia with possible acute blood loss. INR elevated at 2.1 (normal at baseline), acute kidney failure on admit that's improving- Scr  2.58  Goal of Therapy:  Heparin level 0.3-0.7 units/ml Monitor platelets by anticoagulation protocol: Yes   Plan:  Discussed patient with Dr Oletta Darter- will hold heparin in setting of possible GI Bleed.  Pharmacy to sign off- please re-consult as needed.   Netta Cedars PharmD 11/10/2021,11:51 PM

## 2021-11-10 NOTE — Progress Notes (Addendum)
eLink Physician-Brief Progress Note Patient Name: Robert Larson DOB: 06-17-1955 MRN: [id number]   Date of Service  11/10/2021  HPI/Events of Note  Nursing reports new onset AFIB with RVR - Ventricular rate = 120-130. EKG reveals atrial fibrillation with rapid ventricular response with premature ventricular or aberrantly conducted complexes ST & T wave abnormality, consider anterior ischemia. BP = 157/82. Will hold off on Heparin IV infusion d/t concern for GI bleeding on Protonix IV infusion.   eICU Interventions  Plan: Cardizem IV infusion. Titrate to HR = 65-105. BMP and Mg++ level STAT. Cycle Troponin.     Intervention Category Major Interventions: Arrhythmia - evaluation and management  Sharnika Binney Cornelia Copa 11/10/2021, 11:34 PM

## 2021-11-10 NOTE — Telephone Encounter (Signed)
Yes, very sorry to hear, I have been following the notes. Let them know we are here if they need anything, and we look forward to seeing him after discharge for a hospital follow up visit.  Thanks.

## 2021-11-10 NOTE — Progress Notes (Signed)
Progress Note     Subjective: Pt reports abdominal pain is the same to maybe a little better. He is passing a little flatus. NGT output much thinner and more bilious today. Patient really hopes to avoid surgery acutely. CCM at bedside as well.   Objective: Vital signs in last 24 hours: Temp:  [97.7 F (36.5 C)-98.3 F (36.8 C)] 97.8 F (36.6 C) (06/07 0400) Pulse Rate:  [44-127] 63 (06/07 0730) Resp:  [14-34] 22 (06/07 0730) BP: (89-166)/(44-88) 129/72 (06/07 0700) SpO2:  [90 %-98 %] 93 % (06/07 0837) FiO2 (%):  [40 %] 40 % (06/07 0600) Last BM Date : 11/08/21  Intake/Output from previous day: 06/06 0701 - 06/07 0700 In: 1622.9 [I.V.:847.4; NG/GT:120; IV Piggyback:645.5] Out: 4098 [Urine:1375; Emesis/NG output:400; Drains:50] Intake/Output this shift: No intake/output data recorded.  PE: General: pleasant, WD, obese male Heart: regular, rate, and rhythm.   Lungs: on high flow [place], WOB appears less labored today Abd: soft, TTP along left flank and lower abdomen, no peritonitis or guarding, ND, bilious drainage from small bore feeding tube, TG drain with purulent fluid Psych: A&Ox3 with an appropriate affect.    Lab Results:  Recent Labs    11/09/21 2150 11/10/21 0250  WBC 16.3* 15.4*  HGB 12.7* 11.7*  HCT 37.7* 35.0*  PLT 187 168    BMET Recent Labs    11/09/21 2150 11/10/21 0250  NA 140 141  K 3.5 3.5  CL 108 109  CO2 21* 22  GLUCOSE 130* 132*  BUN 88* 90*  CREATININE 2.87* 2.58*  CALCIUM 7.9* 8.2*    PT/INR Recent Labs    11/08/21 0822 11/09/21 0616  LABPROT 19.0* 23.5*  INR 1.6* 2.1*    CMP     Component Value Date/Time   NA 141 11/10/2021 0250   NA 133 (A) 06/24/2021 0000   K 3.5 11/10/2021 0250   CL 109 11/10/2021 0250   CO2 22 11/10/2021 0250   GLUCOSE 132 (H) 11/10/2021 0250   BUN 90 (H) 11/10/2021 0250   BUN 18 06/24/2021 0000   CREATININE 2.58 (H) 11/10/2021 0250   CALCIUM 8.2 (L) 11/10/2021 0250   PROT 5.9 (L) 11/09/2021  0400   ALBUMIN 2.1 (L) 11/09/2021 0400   AST 102 (H) 11/09/2021 0400   ALT 76 (H) 11/09/2021 0400   ALKPHOS 40 11/09/2021 0400   BILITOT 1.2 11/09/2021 0400   GFRNONAA 27 (L) 11/10/2021 0250   GFRAA >60 02/19/2018 1123   Lipase     Component Value Date/Time   LIPASE 40 11/07/2021 1029       Studies/Results: DG Abd 1 View  Result Date: 11/09/2021 CLINICAL DATA:  Nasogastric tube placement. EXAM: ABDOMEN - 1 VIEW COMPARISON:  November 08, 2021 FINDINGS: A feeding tube is present with its radiopaque weighted tip overlying the region of the gastric antrum. This represents a new finding. Nonspecific air-filled loops of bowel are seen within the left lower quadrant. No radio-opaque calculi or other significant radiographic abnormality are identified. A percutaneous surgical drain is seen with its distal tip overlying the midline of the pelvis. An intact left hip replacement is noted. IMPRESSION: Interval feeding tube placement and positioning, as described above. Electronically Signed   By: Virgina Norfolk M.D.   On: 11/09/2021 01:11   DG CHEST PORT 1 VIEW  Result Date: 11/10/2021 CLINICAL DATA:  Hypoxia EXAM: PORTABLE CHEST 1 VIEW COMPARISON:  November 09, 2021 FINDINGS: The heart size and mediastinal contours are stable. Feeding tube is identified  unchanged. Increased airspace opacities identified in both lungs, worsened compared prior exam. Patchy consolidation of bilateral lung bases are noted. The visualized skeletal structures are stable. IMPRESSION: Increased airspace opacities identified in both lungs, worsened compared prior exam. Patchy consolidation of bilateral lung bases. Electronically Signed   By: Abelardo Diesel M.D.   On: 11/10/2021 07:25   DG Chest Port 1 View  Result Date: 11/09/2021 CLINICAL DATA:  Hypoxia. EXAM: PORTABLE CHEST 1 VIEW COMPARISON:  November 08, 2021 FINDINGS: There is stable left internal jugular venous catheter positioning. Interval enteric tube placement is seen without  visualization of its distal tip. The heart size and mediastinal contours are within normal limits. Mild diffusely increased interstitial lung markings are seen with mild perihilar prominence of the pulmonary vasculature. Mild bibasilar atelectasis is also noted. There is no evidence of a pleural effusion or pneumothorax. Multilevel degenerative changes are seen throughout the thoracic spine. IMPRESSION: 1. Mild pulmonary vascular congestion with increased interstitial lung markings, likely consistent with interstitial edema. 2. Mild bibasilar atelectasis. Electronically Signed   By: Virgina Norfolk M.D.   On: 11/09/2021 02:25   ECHOCARDIOGRAM COMPLETE  Result Date: 11/09/2021    ECHOCARDIOGRAM REPORT   Patient Name:   MANOLO BOSKET Date of Exam: 11/09/2021 Medical Rec #:  [id number]       Height:       66.9 in Accession #:    [id number]      Weight:       274.0 lb Date of Birth:  15-Jan-1956      BSA:          2.310 m Patient Age:    65 years        BP:           131/58 mmHg Patient Gender: M               HR:           71 bpm. Exam Location:  Inpatient Procedure: 2D Echo, Cardiac Doppler, Color Doppler and Intracardiac            Opacification Agent Indications:    CHF  History:        Patient has no prior history of Echocardiogram examinations.  Sonographer:    Joette Catching RCS Referring Phys: [id number] [place]  1. Left ventricular ejection fraction, by estimation, is 60 to 65%. The left ventricle has normal function. The left ventricle has no regional wall motion abnormalities. There is mild concentric left ventricular hypertrophy. Left ventricular diastolic parameters were normal.  2. Right ventricular systolic function is normal. The right ventricular size is mildly enlarged. Tricuspid regurgitation signal is inadequate for assessing PA pressure.  3. The mitral valve is grossly normal. Trivial mitral valve regurgitation. No evidence of mitral stenosis.  4. The aortic valve is  tricuspid. There is mild calcification of the aortic valve. Aortic valve regurgitation is not visualized. Aortic valve sclerosis/calcification is present, without any evidence of aortic stenosis.  5. There is mild dilatation of the ascending aorta, measuring 42 mm.  6. The inferior vena cava is dilated in size with >50% respiratory variability, suggesting right atrial pressure of 8 mmHg. FINDINGS  Left Ventricle: Left ventricular ejection fraction, by estimation, is 60 to 65%. The left ventricle has normal function. The left ventricle has no regional wall motion abnormalities. Definity contrast agent was given IV to delineate the left ventricular  endocardial borders. The left ventricular internal cavity size was normal in size. There  is mild concentric left ventricular hypertrophy. Left ventricular diastolic parameters were normal. Right Ventricle: The right ventricular size is mildly enlarged. No increase in right ventricular wall thickness. Right ventricular systolic function is normal. Tricuspid regurgitation signal is inadequate for assessing PA pressure. Left Atrium: Left atrial size was normal in size. Right Atrium: Right atrial size was normal in size. Pericardium: Trivial pericardial effusion is present. Mitral Valve: The mitral valve is grossly normal. Trivial mitral valve regurgitation. No evidence of mitral valve stenosis. Tricuspid Valve: The tricuspid valve is grossly normal. Tricuspid valve regurgitation is trivial. No evidence of tricuspid stenosis. Aortic Valve: The aortic valve is tricuspid. There is mild calcification of the aortic valve. Aortic valve regurgitation is not visualized. Aortic valve sclerosis/calcification is present, without any evidence of aortic stenosis. Aortic valve mean gradient measures 10.0 mmHg. Aortic valve peak gradient measures 20.2 mmHg. Aortic valve area, by VTI measures 4.20 cm. Pulmonic Valve: The pulmonic valve was grossly normal. Pulmonic valve regurgitation is not  visualized. No evidence of pulmonic stenosis. Aorta: The aortic root is normal in size and structure. There is mild dilatation of the ascending aorta, measuring 42 mm. Venous: The inferior vena cava is dilated in size with greater than 50% respiratory variability, suggesting right atrial pressure of 8 mmHg. IAS/Shunts: The atrial septum is grossly normal.  LEFT VENTRICLE PLAX 2D LVIDd:         4.50 cm   Diastology LVIDs:         3.30 cm   LV e' medial:    7.83 cm/s LV PW:         1.20 cm   LV E/e' medial:  10.0 LV IVS:        1.40 cm   LV e' lateral:   12.20 cm/s LVOT diam:     2.30 cm   LV E/e' lateral: 6.4 LV SV:         160 LV SV Index:   69 LVOT Area:     4.15 cm  RIGHT VENTRICLE             IVC RV Basal diam:  4.70 cm     IVC diam: 2.30 cm RV Mid diam:    4.20 cm RV S prime:     18.60 cm/s TAPSE (M-mode): 2.8 cm LEFT ATRIUM           Index        RIGHT ATRIUM           Index LA diam:      3.40 cm 1.47 cm/m   RA Area:     19.10 cm LA Vol (A2C): 54.2 ml 23.46 ml/m  RA Volume:   57.00 ml  24.68 ml/m LA Vol (A4C): 48.1 ml 20.82 ml/m  AORTIC VALVE                     PULMONIC VALVE AV Area (Vmax):    3.59 cm      PV Vmax:       1.38 m/s AV Area (Vmean):   3.69 cm      PV Peak grad:  7.6 mmHg AV Area (VTI):     4.20 cm AV Vmax:           224.50 cm/s AV Vmean:          148.500 cm/s AV VTI:            0.381 m AV Peak Grad:      20.2  mmHg AV Mean Grad:      10.0 mmHg LVOT Vmax:         194.00 cm/s LVOT Vmean:        132.000 cm/s LVOT VTI:          0.385 m LVOT/AV VTI ratio: 1.01  AORTA Ao Root diam: 3.80 cm Ao Asc diam:  4.20 cm MITRAL VALVE MV Area (PHT): 3.27 cm    SHUNTS MV Decel Time: 232 msec    Systemic VTI:  0.38 m MV E velocity: 78.30 cm/s  Systemic Diam: 2.30 cm MV A velocity: 88.10 cm/s MV E/A ratio:  0.89 Eleonore Chiquito MD Electronically signed by Eleonore Chiquito MD Signature Date/Time: 11/09/2021/12:26:45 PM    Final    CT IMAGE GUIDED DRAINAGE BY PERCUTANEOUS CATHETER  Result Date:  11/08/2021 INDICATION: Intra-abdominal and pelvic abscess.  Sepsis. EXAM: CT GUIDED RIGHT TRANSGLUTEAL APPROACH ABSCESS DRAINAGE CATHETER PLACEMENT RADIATION DOSE REDUCTION: This exam was performed according to the departmental dose-optimization program which includes automated exposure control, adjustment of the mA and/or kV according to patient size and/or use of iterative reconstruction technique. COMPARISON:  None Available. MEDICATIONS: The patient is currently admitted to the hospital and receiving intravenous antibiotics. The antibiotics were administered within an appropriate time frame prior to the initiation of the procedure. ANESTHESIA/SEDATION: Moderate (conscious) sedation was employed during this procedure. A total of Versed 1 mg and Fentanyl 100 mcg was administered intravenously. Moderate Sedation Time: 27 minutes. The patient's level of consciousness and vital signs were monitored continuously by radiology nursing throughout the procedure under my direct supervision. CONTRAST:  None COMPLICATIONS: None immediate. PROCEDURE: Informed written consent was obtained from the patient and/or patient's representative after a discussion of the risks, benefits and alternatives to treatment. The patient was placed prone on the CT gantry and a pre procedural CT was performed re-demonstrating the known abscess/fluid collection within the deep pelvis. The procedure was planned. A timeout was performed prior to the initiation of the procedure. The RIGHT gluteal soft tissue was prepped and draped in the usual sterile fashion. The overlying soft tissues were anesthetized with 1% lidocaine with epinephrine. Appropriate trajectory was planned with the use of a 22 gauge spinal needle. An 18 gauge trocar needle was advanced into the abscess/fluid collection and a short Amplatz super stiff wire was coiled within the collection. Appropriate positioning was confirmed with a limited CT scan. The tract was serially dilated  allowing placement of a 12 Fr drainage catheter. Appropriate positioning was confirmed with a limited postprocedural CT scan. Feculent fluid was aspirated. The tube was connected to a drainage bag and sutured in place. A dressing was placed. The patient tolerated the procedure well without immediate post procedural complication. IMPRESSION: Successful CT guided placement of a 12 Fr RIGHT transgluteal approach pelvis drainage catheter, as above. Samples were sent to the laboratory as requested by the ordering clinical team. Michaelle Birks, MD [facility] Electronically Signed   By: Michaelle Birks M.D.   On: 11/08/2021 17:30    Anti-infectives: Anti-infectives (From admission, onward)    Start     Dose/Rate Route Frequency Ordered Stop   11/08/21 0730  vancomycin (VANCOREADY) IVPB 1750 mg/350 mL        1,750 mg 175 mL/hr over 120 Minutes Intravenous  Once 11/08/21 0641 11/08/21 0908   11/08/21 0641  vancomycin variable dose per unstable renal function (pharmacist dosing)  Status:  Discontinued         Does  not apply See admin instructions 11/08/21 0641 11/08/21 1138   11/07/21 2000  piperacillin-tazobactam (ZOSYN) IVPB 3.375 g        3.375 g 12.5 mL/hr over 240 Minutes Intravenous Every 8 hours 11/07/21 1921     11/07/21 1430  piperacillin-tazobactam (ZOSYN) IVPB 3.375 g        3.375 g 100 mL/hr over 30 Minutes Intravenous  Once 11/07/21 1418 11/07/21 1535        Assessment/Plan Septic shock - off pressors this AM Multiple intraabdominal abscesses - ?likely diverticular - s/p IR drain placement 6/5 - drainage purulent, Cx with moderate GNR - WBC 15, afebrile, continue IV abx - ttp along left and lower abdomen, no peritonitis and abdomen is soft - no indication for emergent surgical intervention this AM but will continue to monitor closely - drainage from UGI tract bilious today, hgb 11.7, continue PPI and monitor   FEN: NPO, continue  to decompress UGI tract, IVF per CCM VTE: SCDs ID: Zosyn 6/4>>  - per CCM -  AKI - Cr 2.5, improving  Acute respiratory failure with hypoxia - HFNC, per CCM OSA Morbid obesity - BMI 43.01  LOS: 3 days   I reviewed nursing notes, Consultant IR notes, last 24 h vitals and pain scores, last 48 h intake and output, last 24 h labs and trends, last 24 h imaging results, and CCM notes . Discussed with CCM.     Norm Parcel, [facility] 11/10/2021, 8:47 AM Please see Amion for pager number during day hours 7:00am-4:30pm

## 2021-11-10 NOTE — Evaluation (Signed)
Physical Therapy Evaluation Patient Details Name: Robert Larson MRN: [id number] DOB: Dec 21, 1955 Today's Date: 11/10/2021  History of Present Illness  66 yo male admited with abd abscess s/p transgluteal drain, sepsis, GI bleed. Hx of DM, CKD, OA, LBP with sciatica  Clinical Impression  On eval, pt required Mod A +2 for mobility/safety/line management. Moderate pain during session. Min encouragement to stand at bedside. He declined transfer to recliner on today but he knows that we will begin to slowly progress activity. He was able to stand at the bedside, with a RW, and take several side steps. O2 91% on 5L during session, dyspnea 3/4 with activity. He is weak and fatigues fairly easily. Discussed d/c plan with pt and daughter-they are hopeful he will be able to return home once medically stable. PT will plan to follow pt during hospital stay.        Recommendations for follow up therapy are one component of a multi-disciplinary discharge planning process, led by the attending physician.  Recommendations may be updated based on patient status, additional functional criteria and insurance authorization.  Follow Up Recommendations Home health PT (vs SNF-depending on progress/medical course. Pt's goal is to return home.)    Assistance Recommended at Discharge Frequent or constant Supervision/Assistance  Patient can return home with the following  A lot of help with bathing/dressing/bathroom;A lot of help with walking and/or transfers;Assist for transportation;Help with stairs or ramp for entrance;Assistance with cooking/housework    Equipment Recommendations Rolling walker (2 wheels)  Recommendations for Other Services  OT consult    Functional Status Assessment Patient has had a recent decline in their functional status and demonstrates the ability to make significant improvements in function in a reasonable and predictable amount of time.     Precautions / Restrictions  Precautions Precautions: Fall Precaution Comments: drain R gluteal, NG tube, Aline....lots of lines Restrictions Weight Bearing Restrictions: No      Mobility  Bed Mobility Overal bed mobility: Needs Assistance Bed Mobility: Supine to Sit, Sit to Supine     Supine to sit: Mod assist, +2 for physical assistance, +2 for safety/equipment, HOB elevated Sit to supine: Mod assist, +2 for physical assistance, +2 for safety/equipment, HOB elevated   General bed mobility comments: Assist for trunk and bil LEs. Utilized bedpad. Increased time. Task is effortful and painful for pt. Cues for safety, technique. May have to consider logroll, especially for sit to supine. He does not tolerate lying flat well 2* pain.    Transfers Overall transfer level: Needs assistance Equipment used: Rolling walker (2 wheels) Transfers: Sit to/from Stand Sit to Stand: Mod assist, +2 safety/equipment, From elevated surface, +2 physical assistance           General transfer comment: Assist to power up, stabilize, control descent. Cues for safety, technique. Pt pull up on/relied heavily on RW.    Ambulation/Gait Ambulation/Gait assistance: Min assist, +2 safety/equipment; +2 physical assistance   Assistive device: Rolling walker (2 wheels)         General Gait Details: side steps along side of bed with RW. Assist to steady pt and manage RW. Increased time. He did not want to sit in the recliner on today  Stairs            Wheelchair Mobility    Modified Rankin (Stroke Patients Only)       Balance Overall balance assessment: Needs assistance         Standing balance support: Reliant on assistive device for balance,  During functional activity Standing balance-Leahy Scale: Poor                               Pertinent Vitals/Pain Pain Assessment Pain Assessment: 0-10 Pain Score: 7  Pain Location: abdomen, gluteal area (drain site) Pain Descriptors / Indicators:  Discomfort, Grimacing, Guarding Pain Intervention(s): Limited activity within patient's tolerance, Monitored during session, Repositioned    Home Living Family/patient expects to be discharged to:: Private residence Living Arrangements: Parent Available Help at Discharge: Family Type of Home: House Home Access: Stairs to enter     Alternate Level Stairs-Number of Steps: 7 Home Layout: Multi-level Home Equipment: None      Prior Function Prior Level of Function : Independent/Modified Independent;Driving                     Hand Dominance        Extremity/Trunk Assessment   Upper Extremity Assessment Upper Extremity Assessment: Defer to OT evaluation    Lower Extremity Assessment Lower Extremity Assessment: Generalized weakness    Cervical / Trunk Assessment Cervical / Trunk Assessment: Normal  Communication   Communication: No difficulties  Cognition Arousal/Alertness: Awake/alert Behavior During Therapy: WFL for tasks assessed/performed Overall Cognitive Status: Within Functional Limits for tasks assessed                                          General Comments      Exercises     Assessment/Plan    PT Assessment Patient needs continued PT services  PT Problem List Decreased strength;Decreased mobility;Decreased range of motion;Decreased activity tolerance;Decreased balance;Decreased knowledge of use of DME;Pain       PT Treatment Interventions DME instruction;Gait training;Therapeutic activities;Therapeutic exercise;Patient/family education;Balance training;Functional mobility training    PT Goals (Current goals can be found in the Care Plan section)  Acute Rehab PT Goals Patient Stated Goal: less pain PT Goal Formulation: With patient/family Time For Goal Achievement: 11/24/21 Potential to Achieve Goals: Good    Frequency Min 3X/week     Co-evaluation               AM-PAC PT "6 Clicks" Mobility  Outcome Measure  Help needed turning from your back to your side while in a flat bed without using bedrails?: A Lot Help needed moving from lying on your back to sitting on the side of a flat bed without using bedrails?: A Lot Help needed moving to and from a bed to a chair (including a wheelchair)?: A Lot Help needed standing up from a chair using your arms (e.g., wheelchair or bedside chair)?: A Lot Help needed to walk in hospital room?: Total Help needed climbing 3-5 steps with a railing? : Total 6 Click Score: 10    End of Session   Activity Tolerance: Patient limited by fatigue;Patient limited by pain Patient left: in bed;with call bell/phone within reach;with family/visitor present   PT Visit Diagnosis: Pain;Muscle weakness (generalized) (M62.81);Difficulty in walking, not elsewhere classified (R26.2) Pain - part of body:  (abdomen; gluteal area)    Time: 6578-4696 PT Time Calculation (min) (ACUTE ONLY): 38 min   Charges:   PT Evaluation $PT Eval Moderate Complexity: 1 Mod PT Treatments $Therapeutic Activity: 8-22 mins           Doreatha Massed, PT Acute Rehabilitation  Office: [phone] Pager: [phone]

## 2021-11-10 NOTE — Progress Notes (Signed)
Referring Physician(s): Clent Demark  Supervising Physician: Daryll Brod  Patient Status:  [facility] - In-pt  Chief Complaint: pelvic abscess    Subjective:  Pt reports generalized pain, but states that the intensity is lessening. Pt afebrile, nasogastric and feeding tubes in place. Pt's daughter present in room. Pt sitting upright and working with physical therapy at time of exam.    Allergies: Patient has no known allergies.  Medications: Prior to Admission medications   Medication Sig Start Date End Date Taking? Authorizing Provider  FARXIGA 10 MG TABS tablet Take 10 mg by mouth daily.   Yes [provider]  metoprolol succinate (TOPROL-XL) 25 MG 24 hr tablet Take 1 tablet (25 mg total) by mouth at bedtime. START with 1/2 pill for one week, then increase to 1 full pill. Patient taking differently: Take 25 mg by mouth at bedtime. 10/18/21  Yes Jeanie Sewer, NP  dapagliflozin propanediol (FARXIGA) 5 MG TABS tablet Take 1 tablet (5 mg total) by mouth daily. Patient not taking: Reported on 11/07/2021 10/14/21   Jeanie Sewer, NP  Dulaglutide (TRULICITY) 1.5 OJ/5.0KX SOPN Inject 1.5 mg into the skin once a week. Start after you run out of the 0.'75mg'$  dose x 2 each week. Patient not taking: Reported on 11/07/2021 10/14/21   Jeanie Sewer, NP  metFORMIN (GLUCOPHAGE-XR) 500 MG 24 hr tablet TAKE 1 TABLET BY MOUTH EVERY DAY WITH BREAKFAST Patient not taking: Reported on 11/07/2021 10/14/21   Jeanie Sewer, NP  Omega-3 Fatty Acids (FISH OIL PO) Take 2 capsules by mouth daily. Patient not taking: Reported on 11/07/2021    [provider]  telmisartan-hydrochlorothiazide (MICARDIS HCT) 80-25 MG tablet Take 1 tablet by mouth daily. Patient not taking: Reported on 11/07/2021    [provider]  varenicline (CHANTIX) 0.5 MG tablet INCREASE TO 2 TABS IN AM, 1 TAB IN PM FOR 1-2 WEEKS, THEN 2 TABS AM AND 2 TABS PM. Patient not taking: Reported on 11/07/2021 10/08/21    Jeanie Sewer, NP     Vital Signs: BP 134/67   Pulse 63   Temp 97.7 F (36.5 C) (Oral)   Resp 17   Ht 5' 6.93" (1.7 m)   Wt 274 lb 0.5 oz (124.3 kg)   SpO2 94%   BMI 43.01 kg/m   Physical Exam: pt awake & alert, answered questions appropriately; TG drain intact, output 50 cc yellow colored purulent fluid; pt states nursing has been flushing without difficulties  Imaging: CT Abdomen Pelvis Wo Contrast  Addendum Date: 11/07/2021   ADDENDUM REPORT: 11/07/2021 14:20 ADDENDUM: Following should be added to the impression. There are nodules in both adrenals with density measurements of less than 10 Hounsfield units suggesting possible incidental adenomas. Electronically Signed   By: Elmer Picker M.D.   On: 11/07/2021 14:20   Result Date: 11/07/2021 CLINICAL DATA:  Pain left lower quadrant of abdomen EXAM: CT ABDOMEN AND PELVIS WITHOUT CONTRAST TECHNIQUE: Multidetector CT imaging of the abdomen and pelvis was performed following the standard protocol without IV contrast. RADIATION DOSE REDUCTION: This exam was performed according to the departmental dose-optimization program which includes automated exposure control, adjustment of the mA and/or kV according to patient size and/or use of iterative reconstruction technique. COMPARISON:  None Available. FINDINGS: Lower chest: Small linear densities in the lower lung fields may suggest scarring or subsegmental atelectasis. Hepatobiliary: Liver measures 18 cm in length. There is mild nodularity in the liver surface. No focal abnormality is seen. There is no dilation of bile ducts.  There is subtle increased density in the dependent portion of gallbladder suggesting sludge and possibly stones. There is no fluid around the gallbladder. Pancreas: Unremarkable. Spleen: Spleen measures 13.6 cm in maximum diameter. Adrenals/Urinary Tract: There is 2.6 cm nodule in the left adrenal. There is 2 cm nodule in the right adrenal. Density measurements are less  than 10 Hounsfield units. There is no hydronephrosis. There are few smooth marginated low-density lesions largest measuring 6.2 cm in size in the anterior midportion of left kidney. There are 2 small high density nodules in the left adrenal. There are no renal or ureteral stones. Urinary bladder is not distended. Beam hardening artifacts are partly obscuring portions of the bladder. Stomach/Bowel: Stomach is unremarkable. Small bowel loops are not dilated. Appendix is not visualized. Scattered diverticula are seen in colon. There is stranding in the mesenteric fat in the lower abdomen. There is 11.5 x 6 x 11.7 cm loculated fluid collection with air-fluid level in the pelvis. There is another loculated fluid collection in the right lower quadrant measuring 7 x 3.8 x 4.1 cm with air-fluid level. There is 3.9 x 2 cm fluid collection in the lower abdomen slightly more to the left of midline. Inflammatory stranding is slightly more in the small bowel mesentery rather than sigmoid colon. Vascular/Lymphatic: There are scattered arterial calcifications. Reproductive: Evaluation of prostate is limited by beam hardening artifacts from orthopedic hardware. Other: There is no ascites or pneumoperitoneum. Musculoskeletal: Degenerative changes are noted in the lumbar spine with spinal stenosis and encroachment of neural foramina at multiple levels. There is previous left hip arthroplasty. IMPRESSION: There is no evidence of intestinal obstruction or pneumoperitoneum. There is no hydronephrosis. There are multiple loculated fluid collections with air-fluid levels in the lower abdomen and pelvis. Largest of these collections is in the pelvic cavity measuring 11.5 x 6 x 11.7 cm. There are other smaller fluid collections in the right lower quadrant and lower abdomen slightly more to the left of midline. Findings suggest possible multiple abscesses. Inflammatory changes are more pronounced in the small bowel mesentery. Diverticulosis  of colon without significant wall thickening or focal pericolic stranding. Findings suggest possible diverticulitis with multiple abscesses or inflammatory or infectious process in the distal small bowel loops with multiple abscesses. Repeat CT with intravenous and oral contrast may be considered for further characterization. Increased density in the lumen of the gallbladder may suggest presence of sludge and possibly tiny stones. Bilateral renal cysts. There 2 small foci of high density in the left kidney, possibly hyperdense cysts. Lumbar spondylosis with spinal stenosis and encroachment of neural foramina at multiple levels. Other findings as described in the body of the report. Electronically Signed: By: Elmer Picker M.D. On: 11/07/2021 13:56   DG Abd 1 View  Result Date: 11/09/2021 CLINICAL DATA:  Nasogastric tube placement. EXAM: ABDOMEN - 1 VIEW COMPARISON:  November 08, 2021 FINDINGS: A feeding tube is present with its radiopaque weighted tip overlying the region of the gastric antrum. This represents a new finding. Nonspecific air-filled loops of bowel are seen within the left lower quadrant. No radio-opaque calculi or other significant radiographic abnormality are identified. A percutaneous surgical drain is seen with its distal tip overlying the midline of the pelvis. An intact left hip replacement is noted. IMPRESSION: Interval feeding tube placement and positioning, as described above. Electronically Signed   By: Virgina Norfolk M.D.   On: 11/09/2021 01:11   DG Abd 1 View  Result Date: 11/08/2021 CLINICAL DATA:  Intra-abdominal abscess  and diarrhea. EXAM: ABDOMEN - 1 VIEW COMPARISON:  CT yesterday. FINDINGS: Supine view of the central abdomen which excludes both flanks. Air filled structure in the midline pelvis likely corresponds to abscess on CT. The additional fluid collections on CT are not well seen by radiograph. Soft tissue attenuation from habitus limits assessment. IMPRESSION: Air  filled structure in the midline pelvis likely corresponds to abscess with air-fluid level on yesterday's CT. The additional fluid collections on CT are not well seen by radiograph. Electronically Signed   By: Keith Rake M.D.   On: 11/08/2021 02:00   DG CHEST PORT 1 VIEW  Result Date: 11/10/2021 CLINICAL DATA:  Hypoxia EXAM: PORTABLE CHEST 1 VIEW COMPARISON:  November 09, 2021 FINDINGS: The heart size and mediastinal contours are stable. Feeding tube is identified unchanged. Increased airspace opacities identified in both lungs, worsened compared prior exam. Patchy consolidation of bilateral lung bases are noted. The visualized skeletal structures are stable. IMPRESSION: Increased airspace opacities identified in both lungs, worsened compared prior exam. Patchy consolidation of bilateral lung bases. Electronically Signed   By: Abelardo Diesel M.D.   On: 11/10/2021 07:25   DG Chest Port 1 View  Result Date: 11/09/2021 CLINICAL DATA:  Hypoxia. EXAM: PORTABLE CHEST 1 VIEW COMPARISON:  November 08, 2021 FINDINGS: There is stable left internal jugular venous catheter positioning. Interval enteric tube placement is seen without visualization of its distal tip. The heart size and mediastinal contours are within normal limits. Mild diffusely increased interstitial lung markings are seen with mild perihilar prominence of the pulmonary vasculature. Mild bibasilar atelectasis is also noted. There is no evidence of a pleural effusion or pneumothorax. Multilevel degenerative changes are seen throughout the thoracic spine. IMPRESSION: 1. Mild pulmonary vascular congestion with increased interstitial lung markings, likely consistent with interstitial edema. 2. Mild bibasilar atelectasis. Electronically Signed   By: Virgina Norfolk M.D.   On: 11/09/2021 02:25   DG CHEST PORT 1 VIEW  Result Date: 11/08/2021 CLINICAL DATA:  Encounter for central line placement. EXAM: PORTABLE CHEST 1 VIEW COMPARISON:  AP chest 11/08/2021 at 6:40  a.m.; chest two views 08/28/2007 FINDINGS: AP chest 11/08/2021 at 8:35 a.m. New left internal jugular central venous catheter with tip overlying the mid to central aspect of the superior vena cava, in appropriate position. Cardiac silhouette is again within normal size limits. Mediastinal contours are within normal limits. Mild bilateral lower lung interstitial thickening is unchanged. No definite pleural effusion. No pneumothorax. No acute skeletal abnormality. IMPRESSION: 1. New left internal jugular central venous catheter tip in appropriate position. 2. Likely mild interstitial pulmonary edema. Electronically Signed   By: Yvonne Kendall M.D.   On: 11/08/2021 08:45   DG CHEST PORT 1 VIEW  Result Date: 11/08/2021 CLINICAL DATA:  Hypoxia, labored breathing. EXAM: PORTABLE CHEST 1 VIEW COMPARISON:  August 28, 2007 FINDINGS: The heart size and mediastinal contours are stable. The heart size enlarged. Mild increased pulmonary interstitium is identified bilaterally. Mild patchy consolidation of bilateral lung bases are noted. The visualized skeletal structures are stable. IMPRESSION: 1. Mild congestive heart failure. 2. Mild patchy consolidation of bilateral lung bases, pneumonia is not excluded. Electronically Signed   By: Abelardo Diesel M.D.   On: 11/08/2021 06:53   ECHOCARDIOGRAM COMPLETE  Result Date: 11/09/2021    ECHOCARDIOGRAM REPORT   Patient Name:   Robert Larson Date of Exam: 11/09/2021 Medical Rec #:  [id number]       Height:       66.9 in Accession #:  [id number]      Weight:       274.0 lb Date of Birth:  20-Dec-1955      BSA:          2.310 m Patient Age:    66 years        BP:           131/58 mmHg Patient Gender: M               HR:           71 bpm. Exam Location:  Inpatient Procedure: 2D Echo, Cardiac Doppler, Color Doppler and Intracardiac            Opacification Agent Indications:    CHF  History:        Patient has no prior history of Echocardiogram examinations.  Sonographer:    Joette Catching RCS Referring Phys: [id number] [place]  1. Left ventricular ejection fraction, by estimation, is 60 to 65%. The left ventricle has normal function. The left ventricle has no regional wall motion abnormalities. There is mild concentric left ventricular hypertrophy. Left ventricular diastolic parameters were normal.  2. Right ventricular systolic function is normal. The right ventricular size is mildly enlarged. Tricuspid regurgitation signal is inadequate for assessing PA pressure.  3. The mitral valve is grossly normal. Trivial mitral valve regurgitation. No evidence of mitral stenosis.  4. The aortic valve is tricuspid. There is mild calcification of the aortic valve. Aortic valve regurgitation is not visualized. Aortic valve sclerosis/calcification is present, without any evidence of aortic stenosis.  5. There is mild dilatation of the ascending aorta, measuring 42 mm.  6. The inferior vena cava is dilated in size with >50% respiratory variability, suggesting right atrial pressure of 8 mmHg. FINDINGS  Left Ventricle: Left ventricular ejection fraction, by estimation, is 60 to 65%. The left ventricle has normal function. The left ventricle has no regional wall motion abnormalities. Definity contrast agent was given IV to delineate the left ventricular  endocardial borders. The left ventricular internal cavity size was normal in size. There is mild concentric left ventricular hypertrophy. Left ventricular diastolic parameters were normal. Right Ventricle: The right ventricular size is mildly enlarged. No increase in right ventricular wall thickness. Right ventricular systolic function is normal. Tricuspid regurgitation signal is inadequate for assessing PA pressure. Left Atrium: Left atrial size was normal in size. Right Atrium: Right atrial size was normal in size. Pericardium: Trivial pericardial effusion is present. Mitral Valve: The mitral valve is grossly normal. Trivial mitral valve  regurgitation. No evidence of mitral valve stenosis. Tricuspid Valve: The tricuspid valve is grossly normal. Tricuspid valve regurgitation is trivial. No evidence of tricuspid stenosis. Aortic Valve: The aortic valve is tricuspid. There is mild calcification of the aortic valve. Aortic valve regurgitation is not visualized. Aortic valve sclerosis/calcification is present, without any evidence of aortic stenosis. Aortic valve mean gradient measures 10.0 mmHg. Aortic valve peak gradient measures 20.2 mmHg. Aortic valve area, by VTI measures 4.20 cm. Pulmonic Valve: The pulmonic valve was grossly normal. Pulmonic valve regurgitation is not visualized. No evidence of pulmonic stenosis. Aorta: The aortic root is normal in size and structure. There is mild dilatation of the ascending aorta, measuring 42 mm. Venous: The inferior vena cava is dilated in size with greater than 50% respiratory variability, suggesting right atrial pressure of 8 mmHg. IAS/Shunts: The atrial septum is grossly normal.  LEFT VENTRICLE PLAX 2D LVIDd:         4.50  cm   Diastology LVIDs:         3.30 cm   LV e' medial:    7.83 cm/s LV PW:         1.20 cm   LV E/e' medial:  10.0 LV IVS:        1.40 cm   LV e' lateral:   12.20 cm/s LVOT diam:     2.30 cm   LV E/e' lateral: 6.4 LV SV:         160 LV SV Index:   69 LVOT Area:     4.15 cm  RIGHT VENTRICLE             IVC RV Basal diam:  4.70 cm     IVC diam: 2.30 cm RV Mid diam:    4.20 cm RV S prime:     18.60 cm/s TAPSE (M-mode): 2.8 cm LEFT ATRIUM           Index        RIGHT ATRIUM           Index LA diam:      3.40 cm 1.47 cm/m   RA Area:     19.10 cm LA Vol (A2C): 54.2 ml 23.46 ml/m  RA Volume:   57.00 ml  24.68 ml/m LA Vol (A4C): 48.1 ml 20.82 ml/m  AORTIC VALVE                     PULMONIC VALVE AV Area (Vmax):    3.59 cm      PV Vmax:       1.38 m/s AV Area (Vmean):   3.69 cm      PV Peak grad:  7.6 mmHg AV Area (VTI):     4.20 cm AV Vmax:           224.50 cm/s AV Vmean:           148.500 cm/s AV VTI:            0.381 m AV Peak Grad:      20.2 mmHg AV Mean Grad:      10.0 mmHg LVOT Vmax:         194.00 cm/s LVOT Vmean:        132.000 cm/s LVOT VTI:          0.385 m LVOT/AV VTI ratio: 1.01  AORTA Ao Root diam: 3.80 cm Ao Asc diam:  4.20 cm MITRAL VALVE MV Area (PHT): 3.27 cm    SHUNTS MV Decel Time: 232 msec    Systemic VTI:  0.38 m MV E velocity: 78.30 cm/s  Systemic Diam: 2.30 cm MV A velocity: 88.10 cm/s MV E/A ratio:  0.89 Eleonore Chiquito MD Electronically signed by Eleonore Chiquito MD Signature Date/Time: 11/09/2021/12:26:45 PM    Final    CT IMAGE GUIDED DRAINAGE BY PERCUTANEOUS CATHETER  Result Date: 11/08/2021 INDICATION: Intra-abdominal and pelvic abscess.  Sepsis. EXAM: CT GUIDED RIGHT TRANSGLUTEAL APPROACH ABSCESS DRAINAGE CATHETER PLACEMENT RADIATION DOSE REDUCTION: This exam was performed according to the departmental dose-optimization program which includes automated exposure control, adjustment of the mA and/or kV according to patient size and/or use of iterative reconstruction technique. COMPARISON:  None Available. MEDICATIONS: The patient is currently admitted to the hospital and receiving intravenous antibiotics. The antibiotics were administered within an appropriate time frame prior to the initiation of the procedure. ANESTHESIA/SEDATION: Moderate (conscious) sedation was employed during this procedure. A total of Versed 1 mg and Fentanyl 100  mcg was administered intravenously. Moderate Sedation Time: 27 minutes. The patient's level of consciousness and vital signs were monitored continuously by radiology nursing throughout the procedure under my direct supervision. CONTRAST:  None COMPLICATIONS: None immediate. PROCEDURE: Informed written consent was obtained from the patient and/or patient's representative after a discussion of the risks, benefits and alternatives to treatment. The patient was placed prone on the CT gantry and a pre procedural CT was performed  re-demonstrating the known abscess/fluid collection within the deep pelvis. The procedure was planned. A timeout was performed prior to the initiation of the procedure. The RIGHT gluteal soft tissue was prepped and draped in the usual sterile fashion. The overlying soft tissues were anesthetized with 1% lidocaine with epinephrine. Appropriate trajectory was planned with the use of a 22 gauge spinal needle. An 18 gauge trocar needle was advanced into the abscess/fluid collection and a short Amplatz super stiff wire was coiled within the collection. Appropriate positioning was confirmed with a limited CT scan. The tract was serially dilated allowing placement of a 12 Fr drainage catheter. Appropriate positioning was confirmed with a limited postprocedural CT scan. Feculent fluid was aspirated. The tube was connected to a drainage bag and sutured in place. A dressing was placed. The patient tolerated the procedure well without immediate post procedural complication. IMPRESSION: Successful CT guided placement of a 12 Fr RIGHT transgluteal approach pelvis drainage catheter, as above. Samples were sent to the laboratory as requested by the ordering clinical team. Michaelle Birks, MD [facility] Radiology Electronically Signed   By: Michaelle Birks M.D.   On: 11/08/2021 17:30    Labs:  CBC: Recent Labs    11/09/21 1030 11/09/21 1708 11/09/21 2150 11/10/21 0250  WBC 24.4* 16.8* 16.3* 15.4*  HGB 13.6 13.1 12.7* 11.7*  HCT 39.4 37.7* 37.7* 35.0*  PLT 233 195 187 168    COAGS: Recent Labs    11/08/21 0822 11/09/21 0616  INR 1.6* 2.1*    BMP: Recent Labs    11/09/21 1030 11/09/21 1708 11/09/21 2150 11/10/21 0250  NA 139 140 140 141  K 3.4* 3.4* 3.5 3.5  CL 103 107 108 109  CO2 20* 22 21* 22  GLUCOSE 192* 158* 130* 132*  BUN 97* 94* 88* 90*  CALCIUM 8.3* 8.2* 7.9* 8.2*  CREATININE 3.36* 3.06* 2.87* 2.58*  GFRNONAA 20* 22* 24* 27*    LIVER  FUNCTION TESTS: Recent Labs    11/07/21 1029 11/08/21 0501 11/09/21 0110 11/09/21 0400  BILITOT 1.2 1.5* 1.5* 1.2  AST 42* 70* 99* 102*  ALT 33 57* 76* 76*  ALKPHOS 56 74 42 40  PROT 7.7 5.9* 6.2* 5.9*  ALBUMIN 3.4* 2.2* 2.2* 2.1*    Assessment and Plan: Pt with hx of septic shock, multiple intraabdominal/pelvic abscesses; s/p right TG/pelvic drain placed on 6/5 (12 fr to bag); WBC 15.4 (16.3), Hgb 11.7(12.7), creat 2.58(2.87); drain fluid culture shows gram negative rods, identification pending; CXR showing increased airspace opacities bilaterally with patchy consolidations at lung base.  Plan: Continue TID flushes of drain w 5cc NS Record drain output Q shift Dressing changes QD or PRN if soiled Call IR APP or on call IR MD if difficulty flushing or sudden change in drain output. Repeat imaging/possible drain injection once output < 10 ml/QD (excluding flush material)  Discharge planning: Please contact IR APP or on call IR MD prior to patient d/c to ensure appropriate follow up plans are in place. Typically patient will follow up  with IR clinic 10-14 days post d/c for repeat imaging/possible drain injection. IR scheduler will contact patient with date/time of appointment. Patient will need to flush drain QD with 5 cc NS, record output QD, dressing changes every 2-3 days or earlier if soiled.    IR will continue to follow - please call with questions or concerns.   Will need drain injection before removal   Electronically Signed: D. Rowe Robert, PA-C/Matthew Neysa Bonito, PA-C 11/10/2021, 2:25 PM   I spent a total of 15 Minutes at the the patient's bedside AND on the patient's hospital floor or unit, greater than 50% of which was counseling/coordinating care for pelvic abscess drain.    Patient ID: Robert Larson, male   DOB: 1955/11/29, 66 y.o.   MRN: [id number]

## 2021-11-11 ENCOUNTER — Inpatient Hospital Stay (HOSPITAL_COMMUNITY): Payer: Commercial Managed Care - HMO

## 2021-11-11 DIAGNOSIS — I4891 Unspecified atrial fibrillation: Secondary | ICD-10-CM

## 2021-11-11 DIAGNOSIS — K651 Peritoneal abscess: Secondary | ICD-10-CM | POA: Diagnosis not present

## 2021-11-11 LAB — BLOOD GAS, ARTERIAL
Acid-Base Excess: 1.6 mmol/L (ref 0.0–2.0)
Bicarbonate: 24.6 mmol/L (ref 20.0–28.0)
O2 Saturation: 94.5 %
Patient temperature: 37
pCO2 arterial: 33 mmHg (ref 32–48)
pH, Arterial: 7.48 — ABNORMAL HIGH (ref 7.35–7.45)
pO2, Arterial: 65 mmHg — ABNORMAL LOW (ref 83–108)

## 2021-11-11 LAB — BASIC METABOLIC PANEL
Anion gap: 9 (ref 5–15)
BUN: 79 mg/dL — ABNORMAL HIGH (ref 8–23)
CO2: 24 mmol/L (ref 22–32)
Calcium: 8.3 mg/dL — ABNORMAL LOW (ref 8.9–10.3)
Chloride: 113 mmol/L — ABNORMAL HIGH (ref 98–111)
Creatinine, Ser: 2.1 mg/dL — ABNORMAL HIGH (ref 0.61–1.24)
GFR, Estimated: 35 mL/min — ABNORMAL LOW (ref >60)
Glucose, Bld: 112 mg/dL — ABNORMAL HIGH (ref 70–99)
Potassium: 3.6 mmol/L (ref 3.5–5.1)
Sodium: 146 mmol/L — ABNORMAL HIGH (ref 135–145)

## 2021-11-11 LAB — CBC
HCT: 39.6 % (ref 39.0–52.0)
Hemoglobin: 13 g/dL (ref 13.0–17.0)
MCH: 32.3 pg (ref 26.0–34.0)
MCHC: 32.8 g/dL (ref 30.0–36.0)
MCV: 98.5 fL (ref 80.0–100.0)
Platelets: 172 10*3/uL (ref 150–400)
RBC: 4.02 MIL/uL — ABNORMAL LOW (ref 4.22–5.81)
RDW: 14.6 % (ref 11.5–15.5)
WBC: 14.1 10*3/uL — ABNORMAL HIGH (ref 4.0–10.5)
nRBC: 0 % (ref 0.0–0.2)

## 2021-11-11 LAB — GLUCOSE, CAPILLARY
Glucose-Capillary: 128 mg/dL — ABNORMAL HIGH (ref 70–99)
Glucose-Capillary: 134 mg/dL — ABNORMAL HIGH (ref 70–99)
Glucose-Capillary: 119 mg/dL — ABNORMAL HIGH (ref 70–99)
Glucose-Capillary: 116 mg/dL — ABNORMAL HIGH (ref 70–99)
Glucose-Capillary: 127 mg/dL — ABNORMAL HIGH (ref 70–99)

## 2021-11-11 LAB — COMPREHENSIVE METABOLIC PANEL
ALT: 57 U/L — ABNORMAL HIGH (ref 0–44)
AST: 88 U/L — ABNORMAL HIGH (ref 15–41)
Albumin: 1.9 g/dL — ABNORMAL LOW (ref 3.5–5.0)
Alkaline Phosphatase: 52 U/L (ref 38–126)
Anion gap: 8 (ref 5–15)
BUN: 73 mg/dL — ABNORMAL HIGH (ref 8–23)
CO2: 26 mmol/L (ref 22–32)
Calcium: 8.6 mg/dL — ABNORMAL LOW (ref 8.9–10.3)
Chloride: 112 mmol/L — ABNORMAL HIGH (ref 98–111)
Creatinine, Ser: 1.98 mg/dL — ABNORMAL HIGH (ref 0.61–1.24)
GFR, Estimated: 37 mL/min — ABNORMAL LOW (ref >60)
Glucose, Bld: 111 mg/dL — ABNORMAL HIGH (ref 70–99)
Potassium: 4 mmol/L (ref 3.5–5.1)
Sodium: 146 mmol/L — ABNORMAL HIGH (ref 135–145)
Total Bilirubin: 1 mg/dL (ref 0.3–1.2)
Total Protein: 6 g/dL — ABNORMAL LOW (ref 6.5–8.1)

## 2021-11-11 LAB — AEROBIC/ANAEROBIC CULTURE W GRAM STAIN (SURGICAL/DEEP WOUND)

## 2021-11-11 LAB — MAGNESIUM: Magnesium: 2.9 mg/dL — ABNORMAL HIGH (ref 1.7–2.4)

## 2021-11-11 LAB — TROPONIN I (HIGH SENSITIVITY)
Troponin I (High Sensitivity): 116 ng/L (ref <18)
Troponin I (High Sensitivity): 101 ng/L (ref <18)

## 2021-11-11 MED ORDER — ASPIRIN 300 MG RE SUPP
150.0000 mg | Freq: Every day | RECTAL | Status: DC
  Administered 2021-11-11: 150 mg via RECTAL
  Filled 2021-11-11: qty 1

## 2021-11-11 MED ORDER — LACTATED RINGERS IV SOLN: INTRAVENOUS | Status: AC

## 2021-11-11 MED ORDER — POTASSIUM CHLORIDE 10 MEQ/50ML IV SOLN
10.0000 meq | INTRAVENOUS | Status: AC
  Administered 2021-11-11 (×3): 10 meq via INTRAVENOUS
  Filled 2021-11-11 (×3): qty 50

## 2021-11-11 MED ORDER — PANTOPRAZOLE SODIUM 40 MG IV SOLR
40.0000 mg | Freq: Two times a day (BID) | INTRAVENOUS | Status: DC
Start: 2021-11-11 — End: 2021-11-15
  Administered 2021-11-11 – 2021-11-14 (×8): 40 mg via INTRAVENOUS
  Filled 2021-11-11 (×8): qty 10

## 2021-11-11 NOTE — Progress Notes (Signed)
eLink Physician-Brief Progress Note Patient Name: Robert Larson DOB: [birth year] MRN: [id number]   Date of Service  11/11/2021  HPI/Events of Note  Nursing concern d/t ETpCO2 = 9. Sat = 92% and RR - 17-21. Patient comfortable on PCA Fentanyl. Question accurracy of ETpCO2,  eICU Interventions  Plan: ABG STAT.     Intervention Category Major Interventions: Other:  Lysle Dingwall 11/11/2021, 8:11 PM

## 2021-11-11 NOTE — Evaluation (Signed)
Occupational Therapy Evaluation Patient Details Name: Robert Larson MRN: [id number] DOB: February 26, 1956 Today's Date: 11/11/2021   History of Present Illness 66 yo male admited with abd abscess s/p transgluteal drain, sepsis, GI bleed. Hx of DM, CKD, OA, LBP with sciatica.  On 11/10/21 pt with new onset A-Fib with RVR.   Clinical Impression   Patient is currently requiring assistance with ADLs including Max to total assist with Lower body ADLs, setup to  assist with Upper body ADLs, as well as moderate assist of 2 people with bed mobility and minimal assist +2 people with functional transfers to toilet.  Current level of function is below patient's typical baseline.  During this evaluation, patient was limited by generalized weakness, impaired activity tolerance, and abdominal pain as well as multiple lines/tubes, all of which has the potential to impact patient's safety and independence during functional mobility, as well as performance for ADLs.  Patient lives in a multi level home with full bath upstairs and 1/2 bath on main floor, with his elderly Mother who is able to provide 24/7 supervision but not physical assistance. Pt's daughter and sister can offer PRN assistance. Patient demonstrates good rehab potential, and should benefit from continued skilled occupational therapy services while in acute care to maximize safety, independence and quality of life at home.  Continued occupational therapy services in the home vs SNF is recommended.  ?    Recommendations for follow up therapy are one component of a multi-disciplinary discharge planning process, led by the attending physician.  Recommendations may be updated based on patient status, additional functional criteria and insurance authorization.   Follow Up Recommendations  Home health OT (vs SNF. Will work towards home per pt's goals.)    Assistance Recommended at Discharge    Patient can return home with the following A lot of help with  bathing/dressing/bathroom;A little help with walking and/or transfers    Functional Status Assessment  Patient has had a recent decline in their functional status and demonstrates the ability to make significant improvements in function in a reasonable and predictable amount of time.  Equipment Recommendations  BSC/3in1    Recommendations for Other Services       Precautions / Restrictions Precautions Precautions: Fall Precaution Comments: drain R gluteal, NG tube, Aline....lots of lines Restrictions Weight Bearing Restrictions: No      Mobility Bed Mobility Overal bed mobility: Needs Assistance Bed Mobility: Supine to Sit, Sit to Supine     Supine to sit: Mod assist, +2 for safety/equipment, HOB elevated Sit to supine: Mod assist, +2 for safety/equipment, HOB elevated   General bed mobility comments: Assist for trunk and heavy bil LEs when returning to supine. Utilized bedpad. Increased time. Task is effortful. Cues for safety, technique. Performed reverse logroll for sit to supine.    Transfers                          Balance Overall balance assessment: Needs assistance Sitting-balance support: Feet supported, No upper extremity supported Sitting balance-Leahy Scale: Fair     Standing balance support: Reliant on assistive device for balance, During functional activity Standing balance-Leahy Scale: Poor                             ADL either performed or assessed with clinical judgement   ADL Overall ADL's : Needs assistance/impaired Eating/Feeding: NPO;Sitting Eating/Feeding Details (indicate cue type and reason): Pt able  to accept ice chips spoon fed by daughter while pt EOB Grooming: Wash/dry hands;Sitting;Set up   Upper Body Bathing: Moderate assistance;Sitting   Lower Body Bathing: Maximal assistance;Sitting/lateral leans;Sit to/from stand   Upper Body Dressing : Moderate assistance;Sitting Upper Body Dressing Details (indicate cue  type and reason): Difficult to assess due to multiple lines. Lower Body Dressing: Total assistance;Sitting/lateral leans Lower Body Dressing Details (indicate cue type and reason): To don/doff socks. Toilet Transfer: +2 for safety/equipment;Minimal assistance;Rolling walker (2 wheels);Cueing for sequencing;Cueing for safety Toilet Transfer Details (indicate cue type and reason): Pt stoof from EOB to RW with Min As and multimodal cues for hand placement. Pt took ~4 latearl steps wtih RW and Min As. Pt descended to EOB with Min As and cues for hands. Toileting- Clothing Manipulation and Hygiene: Total assistance Toileting - Clothing Manipulation Details (indicate cue type and reason): Currently on external catheter     Functional mobility during ADLs: Minimal assistance;Moderate assistance;Rolling walker (2 wheels);Cueing for safety;Cueing for sequencing       Vision Baseline Vision/History: 1 Wears glasses Ability to See in Adequate Light: 0 Adequate Patient Visual Report: No change from baseline Vision Assessment?: No apparent visual deficits Additional Comments: able to read clock on wall     Perception     Praxis      Pertinent Vitals/Pain Pain Assessment Pain Assessment: 0-10 Pain Score: 7  Pain Location: abdomen, gluteal area (drain site) Pain Descriptors / Indicators: Discomfort, Grimacing, Guarding Pain Intervention(s): Limited activity within patient's tolerance, Monitored during session (RN in room)     Hand Dominance Right   Extremity/Trunk Assessment Upper Extremity Assessment Upper Extremity Assessment: Overall WFL for tasks assessed   Lower Extremity Assessment Lower Extremity Assessment: Generalized weakness   Cervical / Trunk Assessment Cervical / Trunk Assessment: Normal   Communication Communication Communication: No difficulties   Cognition Arousal/Alertness: Awake/alert Behavior During Therapy: WFL for tasks assessed/performed Overall Cognitive  Status: Impaired/Different from baseline Area of Impairment: Orientation                 Orientation Level: Time, Situation, Disoriented to                   General Comments       Exercises     Shoulder Instructions      Home Living Family/patient expects to be discharged to:: Private residence                   Bathroom Shower/Tub: Walk-in shower;Other (comment) (Primary bath has tub/shower but pt with access to Walk in shower.)   Bathroom Toilet: Handicapped height     Home Equipment: Conservation officer, nature (2 wheels);Cane - single point;Hand held shower head;Grab bars - tub/shower;Grab bars - toilet;Shower seat - built in          Prior Functioning/Environment Prior Level of Function : Independent/Modified Independent;Driving;Needs assist;Working/employed       Physical Assist : ADLs (physical)   ADLs (physical): IADLs   ADLs Comments: Pt's mother is 74 but in excellent health and she performs cooking and laundry. Pt's sister does the cleaning and pt does some of the cooking. Pt's daughter assists with medication management. 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        OT Problem List: Decreased activity tolerance;Pain;Impaired balance (sitting and/or standing)      OT Treatment/Interventions: Self-care/ADL training;Therapeutic exercise;Therapeutic activities;Energy conservation;Patient/family education;DME and/or AE instruction;Balance training    OT Goals(Current goals can be found  in the care plan section) Acute Rehab OT Goals Patient Stated Goal: To go home OT Goal Formulation: With patient/family Time For Goal Achievement: 11/25/21 Potential to Achieve Goals: Good ADL Goals Pt Will Perform Grooming: with modified independence;standing (3/3 with at least fair balance) Pt Will Perform Lower Body Dressing: with adaptive equipment;sitting/lateral leans;sit to/from stand;with modified independence Pt Will Transfer to Toilet: with  modified independence;ambulating;grab bars Pt Will Perform Toileting - Clothing Manipulation and hygiene: with modified independence;sitting/lateral leans;sit to/from stand Additional ADL Goal #1: Pt will engage in 10 min functional activities without loss of sitting or standing balance, in order to demonstrate improved activity tolerance and balance needed to perform ADLs safely at home.  OT Frequency: Min 2X/week    Co-evaluation              AM-PAC OT "6 Clicks" Daily Activity     Outcome Measure Help from another person eating meals?: Total Help from another person taking care of personal grooming?: A Little Help from another person toileting, which includes using toliet, bedpan, or urinal?: Total Help from another person bathing (including washing, rinsing, drying)?: A Lot Help from another person to put on and taking off regular upper body clothing?: A Little Help from another person to put on and taking off regular lower body clothing?: Total 6 Click Score: 11   End of Session Equipment Utilized During Treatment: Gait belt;Oxygen;Rolling walker (2 wheels) Nurse Communication: Other (comment) (RN and CNA in room for entire session.)  Activity Tolerance: Patient tolerated treatment well Patient left: in bed;with nursing/sitter in room;with family/visitor present  OT Visit Diagnosis: Unsteadiness on feet (R26.81);Pain Pain - part of body:  (ABD)                Time: 0102-7253 OT Time Calculation (min): 39 min Charges:  OT General Charges $OT Visit: 1 Visit OT Evaluation $OT Eval Low Complexity: 1 Low OT Treatments $Self Care/Home Management : 8-22 mins $Therapeutic Activity: 8-22 mins  Anderson Malta, OT Acute Rehab Services Office: [phone] 11/11/2021  Julien Girt 11/11/2021, 10:21 AM

## 2021-11-11 NOTE — Consult Note (Addendum)
Cardiology Consultation:   Patient ID: Robert Larson MRN: [id number]; DOB: 03-27-1956  Admit date: 11/07/2021 Date of Consult: 11/11/2021  PCP:  Jeanie Sewer, NP   Robert Larson Providers Cardiologist:  New to [facility] Dr Harl Bowie   Patient Profile:   Robert Larson is a 66 y.o. male with a hx of HTN, type 2 DM, CKD II, osteoarthritis, obesity, tobacco abuse, PSA, who is being seen 11/11/2021 for the evaluation of A fib RVR at the request of Dr Lamonte Sakai.  History of Present Illness:   [name] with above PMH presented to [facility] on 11/07/21 with few days of abdominal pain, 2 months duration of intermittent constipation, poor appetite, nausea without emesis.  He recently started Trulicity 2 months ago.  Admission diagnostic revealed significant hyponatremia, AKI on CKD II, and leukocytosis.  CTAP revealed multiple loculated fluid collection with air-fluid level in the lower abdomen and pelvis concerning for abscesses.  Possible diverticulitis.  Patient was admitted to hospital medicine service, started on IV antibiotic and fluids.  Shortly after he was transferred to ICU due to septic shock with hypotension and lactic acid of 7.1 despite IV fluids resuscitation.  He was placed on vasopressor support and had NG tube placement for decompression. General surgery was consulted, felt this is due to diverticular abscess, recommend percutaneous drainage of intra-abdominal abscess, if lacking clinical improvement, patient may need laparotomy and washout.  Intraperitoneal drain was placed by IR on 11/08/2021, output has been purulent.  Course is also complicated by acute hypoxic respiratory failure requiring high flow nasal cannula oxygen, acute pain requiring PCA, anemia with possible acute GI bleed.    11/10/2021 evening patient was noted with new onset of A-fib with RVR 120-130s by telemetry and EKG.  IV diltiazem gtt was started. HS trop 116 >101.  Echocardiogram completed 11/09/2021 with LVEF 60 to  65%, no RWMA, mild concentric LVH, normal diastolic parameter, normal RV, trivial MR, aortic sclerosis, mild dilatation of ascending aorta 42 mm.  Anticoagulation was not started as there is concern of possible GI bleed.  Cardiology is consulted today for further input.  Upon encounter, patient is laying in bed, states he is feeling tired.  He reports overall improved abdominal pain.  He denies any fever or chills.  He reports resolved nausea.  He states he has no cardiac disease in the past.  He recalls yesterday wake up feeling significantly short of breath, that has resolved now.  He denied ever feeling chest pain or pressure, heart palpitation, dizziness, syncope, peripheral edema, or rapid weight gain.   Lab work today revealed hypernatremia 146, elevated BUN 73, elevated creatinine 1.98, albumin 1.9, elevated AST 88 and ALT 57, and GFR of 37.  CBC with leukocytosis 14 100.    Past Medical History:  Diagnosis Date   Acute right-sided low back pain with bilateral sciatica 03/13/2017   Contusion of left hip and thigh 02/13/2017   Osteoarthritis    Protein in urine    Sleep apnea     Past Surgical History:  Procedure Laterality Date   REPLACEMENT TOTAL HIP W/  RESURFACING IMPLANTS     Left      Home Medications:  Prior to Admission medications   Medication Sig Start Date End Date Taking? Authorizing Provider  FARXIGA 10 MG TABS tablet Take 10 mg by mouth daily.   Yes [provider]  metoprolol succinate (TOPROL-XL) 25 MG 24 hr tablet Take 1 tablet (25 mg total) by mouth at bedtime. START  with 1/2 pill for one week, then increase to 1 full pill. Patient taking differently: Take 25 mg by mouth at bedtime. 10/18/21  Yes Jeanie Sewer, NP  dapagliflozin propanediol (FARXIGA) 5 MG TABS tablet Take 1 tablet (5 mg total) by mouth daily. Patient not taking: Reported on 11/07/2021 10/14/21   Jeanie Sewer, NP  Dulaglutide (TRULICITY) 1.5 PO/2.4MP SOPN Inject 1.5 mg into the skin  once a week. Start after you run out of the 0.'75mg'$  dose x 2 each week. Patient not taking: Reported on 11/07/2021 10/14/21   Jeanie Sewer, NP  metFORMIN (GLUCOPHAGE-XR) 500 MG 24 hr tablet TAKE 1 TABLET BY MOUTH EVERY DAY WITH BREAKFAST Patient not taking: Reported on 11/07/2021 10/14/21   Jeanie Sewer, NP  Omega-3 Fatty Acids (FISH OIL PO) Take 2 capsules by mouth daily. Patient not taking: Reported on 11/07/2021    [provider]  telmisartan-hydrochlorothiazide (MICARDIS HCT) 80-25 MG tablet Take 1 tablet by mouth daily. Patient not taking: Reported on 11/07/2021    [provider]  varenicline (CHANTIX) 0.5 MG tablet INCREASE TO 2 TABS IN AM, 1 TAB IN PM FOR 1-2 WEEKS, THEN 2 TABS AM AND 2 TABS PM. Patient not taking: Reported on 11/07/2021 10/08/21   Jeanie Sewer, NP    Inpatient Medications: Scheduled Meds:  alteplase  2 mg Intracatheter Once   aspirin  150 mg Rectal Daily   chlorhexidine  15 mL Mouth Rinse BID   Chlorhexidine Gluconate Cloth  6 each Topical Daily   fentaNYL   Intravenous Q4H   insulin aspart  0-15 Units Subcutaneous TID WC   ipratropium-albuterol  3 mL Nebulization QID   lip balm  1 application. Topical BID   mouth rinse  15 mL Mouth Rinse q12n4p   pantoprazole (PROTONIX) IV  40 mg Intravenous Q12H   sodium chloride flush  10-40 mL Intracatheter Q12H   sodium chloride flush  3 mL Intravenous Q12H   sodium chloride flush  5 mL Intracatheter Q8H   Continuous Infusions:  sodium chloride 250 mL (11/08/21 0653)   sodium chloride Stopped (11/09/21 1144)   sodium chloride 10 mL/hr at 11/11/21 1000   diltiazem (CARDIZEM) infusion 15 mg/hr (11/11/21 1000)   lactated ringers 50 mL/hr at 11/11/21 1000   ondansetron (ZOFRAN) IV     piperacillin-tazobactam (ZOSYN)  IV Stopped (11/11/21 0719)   potassium chloride 10 mEq (11/11/21 1126)   PRN Meds: Place/Maintain arterial line **AND** sodium chloride, sodium chloride, acetaminophen **OR**  [DISCONTINUED] acetaminophen, alum & mag hydroxide-simeth, diphenhydrAMINE, magic mouthwash, menthol-cetylpyridinium, naloxone **AND** sodium chloride flush, ondansetron (ZOFRAN) IV **OR** ondansetron (ZOFRAN) IV, phenol, prochlorperazine, simethicone, sodium chloride flush  Allergies:   No Known Allergies  Social History:   Social History   Socioeconomic History   Marital status: Married    Spouse name: Not on file   Number of children: 1   Years of education: Not on file   Highest education level: Not on file  Occupational History   Occupation: Artist  Tobacco Use   Smoking status: Every Day    Packs/day: 1.00    Types: Cigarettes   Smokeless tobacco: Never  Vaping Use   Vaping Use: Never used  Substance and Sexual Activity   Alcohol use: No   Drug use: Never   Sexual activity: Not on file  Other Topics Concern   Not on file  Social History Narrative   Not on file   Social Determinants of Health   Financial Resource Strain: Not  on file  Food Insecurity: Not on file  Transportation Needs: Not on file  Physical Activity: Not on file  Stress: Not on file  Social Connections: Not on file  Intimate Partner Violence: Not on file    Family History:    Family History  Problem Relation Age of Onset   Breast cancer Mother    Lung cancer Father    Colon cancer Neg Hx    Rectal cancer Neg Hx      ROS:  Constitutional: see HPI  Eyes: Denied vision change or loss Ears/Nose/Mouth/Throat: Denied ear ache, sore throat, coughing, sinus pain Cardiovascular: see HPI Respiratory: see HPI  Gastrointestinal: see HPI Genital/Urinary: Denied dysuria, hematuria, urinary frequency/urgency Musculoskeletal: Denied muscle ache, joint pain, weakness Skin: Denied rash, wound Neuro: Denied headache, dizziness, syncope Psych: Denied history of depression/anxiety  Endocrine: history of diabetes   Physical Exam/Data:   Vitals:   11/11/21 0950 11/11/21 1000 11/11/21 1100  11/11/21 1132  BP: 125/82 (!) 143/66 (!) 166/92 (!) 127/94  Pulse: 66 67 95 77  Resp: (!) 25 (!) 30 18 (!) 27  Temp:      TempSrc:      SpO2: 92% 93% 96% 95%  Weight:      Height:        Intake/Output Summary (Last 24 hours) at 11/11/2021 1137 Last data filed at 11/11/2021 1000 Gross per 24 hour  Intake 819.13 ml  Output 2525 ml  Net -1705.87 ml      11/11/2021   12:00 AM 11/09/2021   12:00 AM 11/07/2021    6:00 PM  Last 3 Weights  Weight (lbs) 273 lb 5.9 oz 274 lb 0.5 oz 270 lb 8.1 oz  Weight (kg) 124 kg 124.3 kg 122.7 kg     Body mass index is 42.91 kg/m.   Vitals:  Vitals:   11/11/21 1100 11/11/21 1132  BP: (!) 166/92 (!) 127/94  Pulse: 95 77  Resp: 18 (!) 27  Temp:    SpO2: 96% 95%   General Appearance: Fatigued, laying in bed HEENT: Normocephalic, atraumatic.  Neck: Supple, trachea midline, no JVDs, left IJ central line with intact dressing Cardiovascular: Irregularly irregular, S1-S2, no murmur Respiratory: Resting breathing unlabored, lungs sounds diminished, on high flow nasal cannula oxygen support  Gastrointestinal: Bowel sounds positive, abdominal mildly distended, positive tenderness, intraperitoneal drain in place with purulent drainage  Extremities: Able to move all extremities in bed without difficulty, no edema of bilateral lower extremity  Musculoskeletal: Normal muscle bulk and tone Skin: Intact, warm, dry. No rashes or petechiae noted in exposed areas.  Neurologic: Alert, oriented to person, place and time. Fluent speech, no facial droop, no cognitive deficit Psychiatric: Normal affect. Mood is appropriate.   EKG:  The EKG was personally reviewed and demonstrates: A-fib with RVR with ventricular rate of 127bpm, nonspecific ST-T abnormality  Telemetry:  Telemetry was personally reviewed and demonstrates: A-fib with RVR 100-120s  Relevant CV Studies:  Echocardiogram from 11/09/2021:    1. Left ventricular ejection fraction, by estimation, is 60 to 65%.  The  left ventricle has normal function. The left ventricle has no regional  wall motion abnormalities. There is mild concentric left ventricular  hypertrophy. Left ventricular diastolic  parameters were normal.   2. Right ventricular systolic function is normal. The right ventricular  size is mildly enlarged. Tricuspid regurgitation signal is inadequate for  assessing PA pressure.   3. The mitral valve is grossly normal. Trivial mitral valve  regurgitation. No evidence of  mitral stenosis.   4. The aortic valve is tricuspid. There is mild calcification of the  aortic valve. Aortic valve regurgitation is not visualized. Aortic valve  sclerosis/calcification is present, without any evidence of aortic  stenosis.   5. There is mild dilatation of the ascending aorta, measuring 42 mm.   6. The inferior vena cava is dilated in size with >50% respiratory  variability, suggesting right atrial pressure of 8 mmHg.  Laboratory Data:  High Sensitivity Troponin:   Recent Labs  Lab 11/11/21 0000 11/11/21 0230  TROPONINIHS 116* 101*     Chemistry Recent Labs  Lab 11/10/21 0250 11/10/21 1205 11/11/21 0000 11/11/21 0704  NA 141  --  146* 146*  K 3.5  --  3.6 4.0  CL 109  --  113* 112*  CO2 22  --  24 26  GLUCOSE 132*  --  112* 111*  BUN 90*  --  79* 73*  CREATININE 2.58*  --  2.10* 1.98*  CALCIUM 8.2*  --  8.3* 8.6*  MG  --  3.0* 2.9*  --   GFRNONAA 27*  --  35* 37*  ANIONGAP 10  --  9 8    Recent Labs  Lab 11/09/21 0110 11/09/21 0400 11/11/21 0704  PROT 6.2* 5.9* 6.0*  ALBUMIN 2.2* 2.1* 1.9*  AST 99* 102* 88*  ALT 76* 76* 57*  ALKPHOS 42 40 52  BILITOT 1.5* 1.2 1.0   Lipids No results for input(s): "CHOL", "TRIG", "HDL", "LABVLDL", "LDLCALC", "CHOLHDL" in the last 168 hours.  Hematology Recent Labs  Lab 11/09/21 2150 11/10/21 0250 11/11/21 0704  WBC 16.3* 15.4* 14.1*  RBC 4.00* 3.62* 4.02*  HGB 12.7* 11.7* 13.0  HCT 37.7* 35.0* 39.6  MCV 94.3 96.7 98.5  MCH 31.8  32.3 32.3  MCHC 33.7 33.4 32.8  RDW 14.0 14.1 14.6  PLT 187 168 172   Thyroid No results for input(s): "TSH", "FREET4" in the last 168 hours.  BNPNo results for input(s): "BNP", "PROBNP" in the last 168 hours.  DDimer No results for input(s): "DDIMER" in the last 168 hours.   Radiology/Studies:  DG Abd 1 View  Result Date: 11/11/2021 CLINICAL DATA:  Nasogastric tube placement EXAM: ABDOMEN - 1 VIEW COMPARISON:  11/09/2021 FINDINGS: A feeding tube is present with tip in the stomach body oriented towards the antrum. Retrocardiac airspace opacity in the lung bases. A pigtail catheter projects over the pelvis. The top of a left total hip prosthesis is partially visualized. Lower thoracic and lumbar spondylosis noted. IMPRESSION: 1. Feeding tube tip: Stomach body, oriented towards the antrum. 2. Retrocardiac airspace opacity on the left. Electronically Signed   By: Van Clines M.D.   On: 11/11/2021 08:48   DG CHEST PORT 1 VIEW  Result Date: 11/10/2021 CLINICAL DATA:  Hypoxia EXAM: PORTABLE CHEST 1 VIEW COMPARISON:  November 09, 2021 FINDINGS: The heart size and mediastinal contours are stable. Feeding tube is identified unchanged. Increased airspace opacities identified in both lungs, worsened compared prior exam. Patchy consolidation of bilateral lung bases are noted. The visualized skeletal structures are stable. IMPRESSION: Increased airspace opacities identified in both lungs, worsened compared prior exam. Patchy consolidation of bilateral lung bases. Electronically Signed   By: Abelardo Diesel M.D.   On: 11/10/2021 07:25   ECHOCARDIOGRAM COMPLETE  Result Date: 11/09/2021    ECHOCARDIOGRAM REPORT   Patient Name:   TANNAR BROKER Date of Exam: 11/09/2021 Medical Rec #:  [id number]       Height:  66.9 in Accession #:    [id number]      Weight:       274.0 lb Date of Birth:  Nov 07, 1955      BSA:          2.310 m Patient Age:    56 years        BP:           131/58 mmHg Patient Gender: M                HR:           71 bpm. Exam Location:  Inpatient Procedure: 2D Echo, Cardiac Doppler, Color Doppler and Intracardiac            Opacification Agent Indications:    CHF  History:        Patient has no prior history of Echocardiogram examinations.  Sonographer:    Joette Catching RCS Referring Phys: [id number] [place]  1. Left ventricular ejection fraction, by estimation, is 60 to 65%. The left ventricle has normal function. The left ventricle has no regional wall motion abnormalities. There is mild concentric left ventricular hypertrophy. Left ventricular diastolic parameters were normal.  2. Right ventricular systolic function is normal. The right ventricular size is mildly enlarged. Tricuspid regurgitation signal is inadequate for assessing PA pressure.  3. The mitral valve is grossly normal. Trivial mitral valve regurgitation. No evidence of mitral stenosis.  4. The aortic valve is tricuspid. There is mild calcification of the aortic valve. Aortic valve regurgitation is not visualized. Aortic valve sclerosis/calcification is present, without any evidence of aortic stenosis.  5. There is mild dilatation of the ascending aorta, measuring 42 mm.  6. The inferior vena cava is dilated in size with >50% respiratory variability, suggesting right atrial pressure of 8 mmHg. FINDINGS  Left Ventricle: Left ventricular ejection fraction, by estimation, is 60 to 65%. The left ventricle has normal function. The left ventricle has no regional wall motion abnormalities. Definity contrast agent was given IV to delineate the left ventricular  endocardial borders. The left ventricular internal cavity size was normal in size. There is mild concentric left ventricular hypertrophy. Left ventricular diastolic parameters were normal. Right Ventricle: The right ventricular size is mildly enlarged. No increase in right ventricular wall thickness. Right ventricular systolic function is normal. Tricuspid regurgitation  signal is inadequate for assessing PA pressure. Left Atrium: Left atrial size was normal in size. Right Atrium: Right atrial size was normal in size. Pericardium: Trivial pericardial effusion is present. Mitral Valve: The mitral valve is grossly normal. Trivial mitral valve regurgitation. No evidence of mitral valve stenosis. Tricuspid Valve: The tricuspid valve is grossly normal. Tricuspid valve regurgitation is trivial. No evidence of tricuspid stenosis. Aortic Valve: The aortic valve is tricuspid. There is mild calcification of the aortic valve. Aortic valve regurgitation is not visualized. Aortic valve sclerosis/calcification is present, without any evidence of aortic stenosis. Aortic valve mean gradient measures 10.0 mmHg. Aortic valve peak gradient measures 20.2 mmHg. Aortic valve area, by VTI measures 4.20 cm. Pulmonic Valve: The pulmonic valve was grossly normal. Pulmonic valve regurgitation is not visualized. No evidence of pulmonic stenosis. Aorta: The aortic root is normal in size and structure. There is mild dilatation of the ascending aorta, measuring 42 mm. Venous: The inferior vena cava is dilated in size with greater than 50% respiratory variability, suggesting right atrial pressure of 8 mmHg. IAS/Shunts: The atrial septum is grossly normal.  LEFT VENTRICLE PLAX 2D LVIDd:  4.50 cm   Diastology LVIDs:         3.30 cm   LV e' medial:    7.83 cm/s LV PW:         1.20 cm   LV E/e' medial:  10.0 LV IVS:        1.40 cm   LV e' lateral:   12.20 cm/s LVOT diam:     2.30 cm   LV E/e' lateral: 6.4 LV SV:         160 LV SV Index:   69 LVOT Area:     4.15 cm  RIGHT VENTRICLE             IVC RV Basal diam:  4.70 cm     IVC diam: 2.30 cm RV Mid diam:    4.20 cm RV S prime:     18.60 cm/s TAPSE (M-mode): 2.8 cm LEFT ATRIUM           Index        RIGHT ATRIUM           Index LA diam:      3.40 cm 1.47 cm/m   RA Area:     19.10 cm LA Vol (A2C): 54.2 ml 23.46 ml/m  RA Volume:   57.00 ml  24.68 ml/m LA Vol  (A4C): 48.1 ml 20.82 ml/m  AORTIC VALVE                     PULMONIC VALVE AV Area (Vmax):    3.59 cm      PV Vmax:       1.38 m/s AV Area (Vmean):   3.69 cm      PV Peak grad:  7.6 mmHg AV Area (VTI):     4.20 cm AV Vmax:           224.50 cm/s AV Vmean:          148.500 cm/s AV VTI:            0.381 m AV Peak Grad:      20.2 mmHg AV Mean Grad:      10.0 mmHg LVOT Vmax:         194.00 cm/s LVOT Vmean:        132.000 cm/s LVOT VTI:          0.385 m LVOT/AV VTI ratio: 1.01  AORTA Ao Root diam: 3.80 cm Ao Asc diam:  4.20 cm MITRAL VALVE MV Area (PHT): 3.27 cm    SHUNTS MV Decel Time: 232 msec    Systemic VTI:  0.38 m MV E velocity: 78.30 cm/s  Systemic Diam: 2.30 cm MV A velocity: 88.10 cm/s MV E/A ratio:  0.89 Eleonore Chiquito MD Electronically signed by Eleonore Chiquito MD Signature Date/Time: 11/09/2021/12:26:45 PM    Final    DG Chest Port 1 View  Result Date: 11/09/2021 CLINICAL DATA:  Hypoxia. EXAM: PORTABLE CHEST 1 VIEW COMPARISON:  November 08, 2021 FINDINGS: There is stable left internal jugular venous catheter positioning. Interval enteric tube placement is seen without visualization of its distal tip. The heart size and mediastinal contours are within normal limits. Mild diffusely increased interstitial lung markings are seen with mild perihilar prominence of the pulmonary vasculature. Mild bibasilar atelectasis is also noted. There is no evidence of a pleural effusion or pneumothorax. Multilevel degenerative changes are seen throughout the thoracic spine. IMPRESSION: 1. Mild pulmonary vascular congestion with increased interstitial lung markings, likely consistent with interstitial edema.  2. Mild bibasilar atelectasis. Electronically Signed   By: Virgina Norfolk M.D.   On: 11/09/2021 02:25   DG Abd 1 View  Result Date: 11/09/2021 CLINICAL DATA:  Nasogastric tube placement. EXAM: ABDOMEN - 1 VIEW COMPARISON:  November 08, 2021 FINDINGS: A feeding tube is present with its radiopaque weighted tip overlying the  region of the gastric antrum. This represents a new finding. Nonspecific air-filled loops of bowel are seen within the left lower quadrant. No radio-opaque calculi or other significant radiographic abnormality are identified. A percutaneous surgical drain is seen with its distal tip overlying the midline of the pelvis. An intact left hip replacement is noted. IMPRESSION: Interval feeding tube placement and positioning, as described above. Electronically Signed   By: Virgina Norfolk M.D.   On: 11/09/2021 01:11   CT IMAGE GUIDED DRAINAGE BY PERCUTANEOUS CATHETER  Result Date: 11/08/2021 INDICATION: Intra-abdominal and pelvic abscess.  Sepsis. EXAM: CT GUIDED RIGHT TRANSGLUTEAL APPROACH ABSCESS DRAINAGE CATHETER PLACEMENT RADIATION DOSE REDUCTION: This exam was performed according to the departmental dose-optimization program which includes automated exposure control, adjustment of the mA and/or kV according to patient size and/or use of iterative reconstruction technique. COMPARISON:  None Available. MEDICATIONS: The patient is currently admitted to the hospital and receiving intravenous antibiotics. The antibiotics were administered within an appropriate time frame prior to the initiation of the procedure. ANESTHESIA/SEDATION: Moderate (conscious) sedation was employed during this procedure. A total of Versed 1 mg and Fentanyl 100 mcg was administered intravenously. Moderate Sedation Time: 27 minutes. The patient's level of consciousness and vital signs were monitored continuously by radiology nursing throughout the procedure under my direct supervision. CONTRAST:  None COMPLICATIONS: None immediate. PROCEDURE: Informed written consent was obtained from the patient and/or patient's representative after a discussion of the risks, benefits and alternatives to treatment. The patient was placed prone on the CT gantry and a pre procedural CT was performed re-demonstrating the known abscess/fluid collection within the  deep pelvis. The procedure was planned. A timeout was performed prior to the initiation of the procedure. The RIGHT gluteal soft tissue was prepped and draped in the usual sterile fashion. The overlying soft tissues were anesthetized with 1% lidocaine with epinephrine. Appropriate trajectory was planned with the use of a 22 gauge spinal needle. An 18 gauge trocar needle was advanced into the abscess/fluid collection and a short Amplatz super stiff wire was coiled within the collection. Appropriate positioning was confirmed with a limited CT scan. The tract was serially dilated allowing placement of a 12 Fr drainage catheter. Appropriate positioning was confirmed with a limited postprocedural CT scan. Feculent fluid was aspirated. The tube was connected to a drainage bag and sutured in place. A dressing was placed. The patient tolerated the procedure well without immediate post procedural complication. IMPRESSION: Successful CT guided placement of a 12 Fr RIGHT transgluteal approach pelvis drainage catheter, as above. Samples were sent to the laboratory as requested by the ordering clinical team. Michaelle Birks, MD [facility] Electronically Signed   By: Michaelle Birks M.D.   On: 11/08/2021 17:30   DG CHEST PORT 1 VIEW  Result Date: 11/08/2021 CLINICAL DATA:  Encounter for central line placement. EXAM: PORTABLE CHEST 1 VIEW COMPARISON:  AP chest 11/08/2021 at 6:40 a.m.; chest two views 08/28/2007 FINDINGS: AP chest 11/08/2021 at 8:35 a.m. New left internal jugular central venous catheter with tip overlying the mid to central aspect of the superior vena cava, in appropriate position. Cardiac silhouette is again  within normal size limits. Mediastinal contours are within normal limits. Mild bilateral lower lung interstitial thickening is unchanged. No definite pleural effusion. No pneumothorax. No acute skeletal abnormality. IMPRESSION: 1. New left internal  jugular central venous catheter tip in appropriate position. 2. Likely mild interstitial pulmonary edema. Electronically Signed   By: Yvonne Kendall M.D.   On: 11/08/2021 08:45   DG CHEST PORT 1 VIEW  Result Date: 11/08/2021 CLINICAL DATA:  Hypoxia, labored breathing. EXAM: PORTABLE CHEST 1 VIEW COMPARISON:  August 28, 2007 FINDINGS: The heart size and mediastinal contours are stable. The heart size enlarged. Mild increased pulmonary interstitium is identified bilaterally. Mild patchy consolidation of bilateral lung bases are noted. The visualized skeletal structures are stable. IMPRESSION: 1. Mild congestive heart failure. 2. Mild patchy consolidation of bilateral lung bases, pneumonia is not excluded. Electronically Signed   By: Abelardo Diesel M.D.   On: 11/08/2021 06:53   DG Abd 1 View  Result Date: 11/08/2021 CLINICAL DATA:  Intra-abdominal abscess and diarrhea. EXAM: ABDOMEN - 1 VIEW COMPARISON:  CT yesterday. FINDINGS: Supine view of the central abdomen which excludes both flanks. Air filled structure in the midline pelvis likely corresponds to abscess on CT. The additional fluid collections on CT are not well seen by radiograph. Soft tissue attenuation from habitus limits assessment. IMPRESSION: Air filled structure in the midline pelvis likely corresponds to abscess with air-fluid level on yesterday's CT. The additional fluid collections on CT are not well seen by radiograph. Electronically Signed   By: Keith Rake M.D.   On: 11/08/2021 02:00   CT Abdomen Pelvis Wo Contrast  Addendum Date: 11/07/2021   ADDENDUM REPORT: 11/07/2021 14:20 ADDENDUM: Following should be added to the impression. There are nodules in both adrenals with density measurements of less than 10 Hounsfield units suggesting possible incidental adenomas. Electronically Signed   By: Elmer Picker M.D.   On: 11/07/2021 14:20   Result Date: 11/07/2021 CLINICAL DATA:  Pain left lower quadrant of abdomen EXAM: CT ABDOMEN AND  PELVIS WITHOUT CONTRAST TECHNIQUE: Multidetector CT imaging of the abdomen and pelvis was performed following the standard protocol without IV contrast. RADIATION DOSE REDUCTION: This exam was performed according to the departmental dose-optimization program which includes automated exposure control, adjustment of the mA and/or kV according to patient size and/or use of iterative reconstruction technique. COMPARISON:  None Available. FINDINGS: Lower chest: Small linear densities in the lower lung fields may suggest scarring or subsegmental atelectasis. Hepatobiliary: Liver measures 18 cm in length. There is mild nodularity in the liver surface. No focal abnormality is seen. There is no dilation of bile ducts. There is subtle increased density in the dependent portion of gallbladder suggesting sludge and possibly stones. There is no fluid around the gallbladder. Pancreas: Unremarkable. Spleen: Spleen measures 13.6 cm in maximum diameter. Adrenals/Urinary Tract: There is 2.6 cm nodule in the left adrenal. There is 2 cm nodule in the right adrenal. Density measurements are less than 10 Hounsfield units. There is no hydronephrosis. There are few smooth marginated low-density lesions largest measuring 6.2 cm in size in the anterior midportion of left kidney. There are 2 small high density nodules in the left adrenal. There are no renal or ureteral stones. Urinary bladder is not distended. Beam hardening artifacts are partly obscuring portions of the bladder. Stomach/Bowel: Stomach is unremarkable. Small bowel loops are not dilated. Appendix is not visualized. Scattered diverticula are seen in colon. There is stranding in the mesenteric fat in the lower abdomen. There is  11.5 x 6 x 11.7 cm loculated fluid collection with air-fluid level in the pelvis. There is another loculated fluid collection in the right lower quadrant measuring 7 x 3.8 x 4.1 cm with air-fluid level. There is 3.9 x 2 cm fluid collection in the lower  abdomen slightly more to the left of midline. Inflammatory stranding is slightly more in the small bowel mesentery rather than sigmoid colon. Vascular/Lymphatic: There are scattered arterial calcifications. Reproductive: Evaluation of prostate is limited by beam hardening artifacts from orthopedic hardware. Other: There is no ascites or pneumoperitoneum. Musculoskeletal: Degenerative changes are noted in the lumbar spine with spinal stenosis and encroachment of neural foramina at multiple levels. There is previous left hip arthroplasty. IMPRESSION: There is no evidence of intestinal obstruction or pneumoperitoneum. There is no hydronephrosis. There are multiple loculated fluid collections with air-fluid levels in the lower abdomen and pelvis. Largest of these collections is in the pelvic cavity measuring 11.5 x 6 x 11.7 cm. There are other smaller fluid collections in the right lower quadrant and lower abdomen slightly more to the left of midline. Findings suggest possible multiple abscesses. Inflammatory changes are more pronounced in the small bowel mesentery. Diverticulosis of colon without significant wall thickening or focal pericolic stranding. Findings suggest possible diverticulitis with multiple abscesses or inflammatory or infectious process in the distal small bowel loops with multiple abscesses. Repeat CT with intravenous and oral contrast may be considered for further characterization. Increased density in the lumen of the gallbladder may suggest presence of sludge and possibly tiny stones. Bilateral renal cysts. There 2 small foci of high density in the left kidney, possibly hyperdense cysts. Lumbar spondylosis with spinal stenosis and encroachment of neural foramina at multiple levels. Other findings as described in the body of the report. Electronically Signed: By: Elmer Picker M.D. On: 11/07/2021 13:56     Assessment and Plan:   Newly diagnosed A-fib with RVR on 11/10/21 - likely  sepsis/dehydration mediated  - will check TSH - optimize electrolytes with goal of K >4 and Mag >2, correct dehydration, continue antibiotic/drain for infection - Echo from 11/09/21 with LVEF 60 to 65%, no RWMA, no significant valvular disease - OK to continue IV diltiazem for rate control, NPO currently, rate control is reasonable currently given acute sepsis/dehydration/pain, if difficult rate control, may start IV amiodarone gtt  - CHA2DS2-VASc Score = 2 , if GI bleed rules out, A fib duration >48 hours,  may consider anticoagulation with heparin gtt   Septic shock, resolved Multiple intra-abdominal abscess with possible diverticular perforation  Acute hypoxic respiratory failure AKI on CKD stage II Hypertension Type II diabetes OSA - per primary team    Risk Assessment/Risk Scores:  { CHA2DS2-VASc Score = 2  This indicates a 2.2% annual risk of stroke. The patient's score is based upon: CHF History: 0 HTN History: 1 Diabetes History: 1 Stroke History: 0 Vascular Disease History: 0 Age Score: 0 Gender Score: 0    For questions or updates, please contact [place] Please consult [email or web address] for contact info under    Signed, Margie Billet, NP  11/11/2021 11:37 AM

## 2021-11-11 NOTE — Progress Notes (Signed)
NAME:  Robert Larson, MRN:  [id number], DOB:  02/20/1956, LOS: 4 ADMISSION DATE:  11/07/2021, CONSULTATION DATE:  11/08/21 REFERRING MD:  Lara Mulch , CHIEF COMPLAINT:  abdominal pain  History of Present Illness:  66 yo man admitted to hospital 6/4 PM  Abdominal pain in lower abdomen for several days.   Trulicity (dulaglutide) started 2 months ago, complicated by constipation.  Last BM yesterday.   Admitted to [facility] for severe sepsis due to intraabdominal abscesses. Transferred to ICU 6/5 for septic shock. IR drain 6/5. Worse AKI 6/5, 6/6.   Pertinent  Medical History  DM CKD HTN  Meds  Dulaglutide Metformin Asa 81  Farxiga  Metoprolol 25 q HS Fish oil  Telmisartan-HCTZ Chantix    Significant Hospital Events: Including procedures, antibiotic start and stop dates in addition to other pertinent events   6/4 admit to [facility] sev sepsis due to intraabd abscess CT ABD There are multiple loculated fluid collections with air-fluid levels in the lower abdomen and pelvis. Largest of these collections is in the pelvic cavity measuring 11.5 x 6 x 11.7 cm 6/5 transfer to ICU, septic shock. Worse AKI. IR perc drain of one of the abscesses. Shock improved  6/6 high volume Ngt output. Worse shock worse AKI  6/7 off pressors. Dc foley. CXR interval worsening but weaning HFNC down to 5 6/8 Went into A-fib RVR overnight with no known history of A-fib, HS troponin were also slightly elevated but have down trended     Interim History / Subjective:  Alert and oriented this am AKI continues to slowly improve  2.5L urine output in the last 24hrs without diuresing   Objective   Blood pressure (!) 137/93, pulse (!) 122, temperature 98.1 F (36.7 C), temperature source Oral, resp. rate (!) 28, height 5' 6.93" (1.7 m), weight 124 kg, SpO2 93 %. CVP:  [3 mmHg-12 mmHg] 6 mmHg  FiO2 (%):  [40 %] 40 %   Intake/Output Summary (Last 24 hours) at 11/11/2021 0705 Last data filed at 11/11/2021 0630 Gross per 24  hour  Intake 589.18 ml  Output 3475 ml  Net -2885.82 ml    Filed Weights   11/07/21 1800 11/09/21 0000 11/11/21 0000  Weight: 122.7 kg 124.3 kg 124 kg    Examination: General: Acute ill appearing middle aged male lying in bed, in NAD HEENT: [place]/AT, MM pink/moist, PERRL, small note NGT in right nare  Neuro: Alert and oriented x3, non-focal CV: s1s2 regular rate and rhythm, no murmur, rubs, or gallops,  PULM:  Slightly rhonchi that clears with spontaneous cough, no increased work of breathing, on 6L [place] GI: soft, bowel sounds active in all 4 quadrants, non-tender, non-distended Extremities: warm/dry, no edema  Skin: no rashes or lesions  Resolved Hospital Problem list   Septic shock  AGMA   Assessment & Plan:   Intraabdominal abscess  -multiple abscesses -- largest was 11.5 x 6x 11.7 -- now s/p IR drain 6/5 -No off pressors  P: CCS following, appreciate assistance  Continue supplemental oxygen as needed for sats greater than 92 Follow cultures  Continue Zosyn  Monitor urine output Repeat images per surgery   Acute hypoxic respiratory failure  Hx OSA  -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 Multifactorial -- ? HCAP, ALI due to septic shock, pulm edema. P: Continue supplemental oxygen  Aspiration precautions  Head of bed elevated 30 degrees. Follow intermittent chest x-ray and ABG.   Ensure adequate pulmonary hygiene  Mobilize as able  Nocturnal CPAP  New onset A-fib RVR  -Developed overnight 6/8 Slightly elevated HS troponin  -Peaked at 116 -ECHO WNL 6/6 P: Consult cardiology  ASA started overnight  EKG with no acute ST elevation  Continue Cardizem drip  Continuous telemetry  Check TSH  AKI on CKD - improving AKI  -probably ATN, creatinine 1.6 with GFR 49 06/2021 on admit creatine 2.14 with GFR 34 -dc foley  6/7 -- has had dramatic improvement in UOP and Cr is improving P: Follow renal function  Monitor urine output Trend Bmet Avoid nephrotoxins Ensure  adequate renal perfusion   Transaminitis - improving  Coagulopathy  -in setting of septic shock  P: Avoid hepatotoxins  Trend LFTs  Acute anemia  - critical illness, possibly compounded by a small amount of blood loss due to possible GIB as iatrogenic blood loss from fq labs  GIB  P: Transition to BID PPI  Continue to monitor for repeat bleed  NGT remains in place  Diet advancement per Surgery  Trend CBC   Hx HTN - holding antihypertensives in setting of shock Hx prediabetes P:  Continue SSI  CBG q4hrs  CBG goal 140-180  Best Practice (right click and "Reselect all SmartList Selections" daily)   Diet/type: NPO DVT prophylaxis: not indicated GI prophylaxis: PPI Lines: Central line Foley:  order to dc 6/7  Code Status:  full code Last date of multidisciplinary goals of care discussion: Continue to update patient and family daily   Critical care:   CRITICAL CARE Performed by: Lani Mendiola D. Harris   Total critical care time: 38 minutes  Critical care time was exclusive of separately billable procedures and treating other patients. Critical care was necessary to treat or prevent imminent or life-threatening deterioration.  Critical care was time spent personally by me on the following activities: development of treatment plan with patient and/or surrogate as well as nursing, discussions with consultants, evaluation of patient's response to treatment, examination of patient, obtaining history from patient or surrogate, ordering and performing treatments and interventions, ordering and review of laboratory studies, ordering and review of radiographic studies, pulse oximetry and re-evaluation of patient's condition.  Brean Carberry D. Kenton Kingfisher, NP-C [facility] Personal contact information can be found on Amion  11/11/2021, 7:10 AM

## 2021-11-11 NOTE — Progress Notes (Signed)
eLink Physician-Brief Progress Note Patient Name: Robert Larson DOB: 06/09/1955 MRN: [id number]   Date of Service  11/11/2021  HPI/Events of Note  Troponin #1 = 116. EKG consistent with anterior ischemia. Likely demand in setting of AFIB.   eICU Interventions  Plan: ADD ASA Suppository 150 mg PR now and Q day.  Continue to cycle Troponin.     Intervention Category Major Interventions: Other:  Lysle Dingwall 11/11/2021, 2:41 AM

## 2021-11-11 NOTE — Progress Notes (Signed)
eLink Physician-Brief Progress Note Patient Name: KADARIUS CUFFE DOB: July 09, 1955 MRN: [id number]   Date of Service  11/11/2021  HPI/Events of Note  Hypokalemia - K+ = 3.6 and Creatinine = 3.36 --> 2.1. 2. Troponin = 116 --> 101. Troponin has reached plateau and is now trending down c/w demand ischemia.     eICU Interventions  Plan: Will replace K+.     Intervention Category Major Interventions: Electrolyte abnormality - evaluation and management;Other:  Lysle Dingwall 11/11/2021, 5:46 AM

## 2021-11-11 NOTE — Progress Notes (Signed)
Progress Note     Subjective: Pt with A fib with RVR overnight, cardizem gtt started. Denies chest pain. Abdominal pain stable. He is passing a little flatus intermittently.   Objective: Vital signs in last 24 hours: Temp:  [97.7 F (36.5 C)-98.4 F (36.9 C)] 98.1 F (36.7 C) (06/08 0300) Pulse Rate:  [32-140] 128 (06/08 0700) Resp:  [13-28] 27 (06/08 0700) BP: (109-160)/(49-100) 128/83 (06/08 0700) SpO2:  [90 %-98 %] 93 % (06/08 0700) FiO2 (%):  [40 %] 40 % (06/08 0400) Weight:  [315 kg] 124 kg (06/08 0000) Last BM Date : 11/08/21  Intake/Output from previous day: 06/07 0701 - 06/08 0700 In: 762.1 [I.V.:600.7; IV Piggyback:161.3] Out: 4008 [Urine:2550; Emesis/NG output:900; Drains:25] Intake/Output this shift: No intake/output data recorded.  PE: General: pleasant, WD, obese male Heart: regular, rate, and rhythm.   Lungs: on high flow [place], WOB appears less labored today Abd: soft, TTP along left flank and lower abdomen, no peritonitis or guarding, ND, bilious drainage from small bore feeding tube, TG drain with purulent fluid Psych: A&Ox3 with an appropriate affect.    Lab Results:  Recent Labs    11/10/21 0250 11/11/21 0704  WBC 15.4* 14.1*  HGB 11.7* 13.0  HCT 35.0* 39.6  PLT 168 172    BMET Recent Labs    11/10/21 0250 11/11/21 0000  NA 141 146*  K 3.5 3.6  CL 109 113*  CO2 22 24  GLUCOSE 132* 112*  BUN 90* 79*  CREATININE 2.58* 2.10*  CALCIUM 8.2* 8.3*    PT/INR Recent Labs    11/08/21 0822 11/09/21 0616  LABPROT 19.0* 23.5*  INR 1.6* 2.1*    CMP     Component Value Date/Time   NA 146 (H) 11/11/2021 0000   NA 133 (A) 06/24/2021 0000   K 3.6 11/11/2021 0000   CL 113 (H) 11/11/2021 0000   CO2 24 11/11/2021 0000   GLUCOSE 112 (H) 11/11/2021 0000   BUN 79 (H) 11/11/2021 0000   BUN 18 06/24/2021 0000   CREATININE 2.10 (H) 11/11/2021 0000   CALCIUM 8.3 (L) 11/11/2021 0000   PROT 5.9 (L) 11/09/2021 0400   ALBUMIN 2.1 (L) 11/09/2021  0400   AST 102 (H) 11/09/2021 0400   ALT 76 (H) 11/09/2021 0400   ALKPHOS 40 11/09/2021 0400   BILITOT 1.2 11/09/2021 0400   GFRNONAA 35 (L) 11/11/2021 0000   GFRAA >60 02/19/2018 1123   Lipase     Component Value Date/Time   LIPASE 40 11/07/2021 1029       Studies/Results: DG CHEST PORT 1 VIEW  Result Date: 11/10/2021 CLINICAL DATA:  Hypoxia EXAM: PORTABLE CHEST 1 VIEW COMPARISON:  November 09, 2021 FINDINGS: The heart size and mediastinal contours are stable. Feeding tube is identified unchanged. Increased airspace opacities identified in both lungs, worsened compared prior exam. Patchy consolidation of bilateral lung bases are noted. The visualized skeletal structures are stable. IMPRESSION: Increased airspace opacities identified in both lungs, worsened compared prior exam. Patchy consolidation of bilateral lung bases. Electronically Signed   By: Abelardo Diesel M.D.   On: 11/10/2021 07:25   ECHOCARDIOGRAM COMPLETE  Result Date: 11/09/2021    ECHOCARDIOGRAM REPORT   Patient Name:   Robert Larson Date of Exam: 11/09/2021 Medical Rec #:  [id number]       Height:       66.9 in Accession #:    [id number]      Weight:       274.0 lb  Date of Birth:  09-Feb-1956      BSA:          2.310 m Patient Age:    66 years        BP:           131/58 mmHg Patient Gender: M               HR:           71 bpm. Exam Location:  Inpatient Procedure: 2D Echo, Cardiac Doppler, Color Doppler and Intracardiac            Opacification Agent Indications:    CHF  History:        Patient has no prior history of Echocardiogram examinations.  Sonographer:    Joette Catching RCS Referring Phys: [id number] New Site  1. Left ventricular ejection fraction, by estimation, is 60 to 65%. The left ventricle has normal function. The left ventricle has no regional wall motion abnormalities. There is mild concentric left ventricular hypertrophy. Left ventricular diastolic parameters were normal.  2. Right ventricular  systolic function is normal. The right ventricular size is mildly enlarged. Tricuspid regurgitation signal is inadequate for assessing PA pressure.  3. The mitral valve is grossly normal. Trivial mitral valve regurgitation. No evidence of mitral stenosis.  4. The aortic valve is tricuspid. There is mild calcification of the aortic valve. Aortic valve regurgitation is not visualized. Aortic valve sclerosis/calcification is present, without any evidence of aortic stenosis.  5. There is mild dilatation of the ascending aorta, measuring 42 mm.  6. The inferior vena cava is dilated in size with >50% respiratory variability, suggesting right atrial pressure of 8 mmHg. FINDINGS  Left Ventricle: Left ventricular ejection fraction, by estimation, is 60 to 65%. The left ventricle has normal function. The left ventricle has no regional wall motion abnormalities. Definity contrast agent was given IV to delineate the left ventricular  endocardial borders. The left ventricular internal cavity size was normal in size. There is mild concentric left ventricular hypertrophy. Left ventricular diastolic parameters were normal. Right Ventricle: The right ventricular size is mildly enlarged. No increase in right ventricular wall thickness. Right ventricular systolic function is normal. Tricuspid regurgitation signal is inadequate for assessing PA pressure. Left Atrium: Left atrial size was normal in size. Right Atrium: Right atrial size was normal in size. Pericardium: Trivial pericardial effusion is present. Mitral Valve: The mitral valve is grossly normal. Trivial mitral valve regurgitation. No evidence of mitral valve stenosis. Tricuspid Valve: The tricuspid valve is grossly normal. Tricuspid valve regurgitation is trivial. No evidence of tricuspid stenosis. Aortic Valve: The aortic valve is tricuspid. There is mild calcification of the aortic valve. Aortic valve regurgitation is not visualized. Aortic valve sclerosis/calcification is  present, without any evidence of aortic stenosis. Aortic valve mean gradient measures 10.0 mmHg. Aortic valve peak gradient measures 20.2 mmHg. Aortic valve area, by VTI measures 4.20 cm. Pulmonic Valve: The pulmonic valve was grossly normal. Pulmonic valve regurgitation is not visualized. No evidence of pulmonic stenosis. Aorta: The aortic root is normal in size and structure. There is mild dilatation of the ascending aorta, measuring 42 mm. Venous: The inferior vena cava is dilated in size with greater than 50% respiratory variability, suggesting right atrial pressure of 8 mmHg. IAS/Shunts: The atrial septum is grossly normal.  LEFT VENTRICLE PLAX 2D LVIDd:         4.50 cm   Diastology LVIDs:         3.30 cm  LV e' medial:    7.83 cm/s LV PW:         1.20 cm   LV E/e' medial:  10.0 LV IVS:        1.40 cm   LV e' lateral:   12.20 cm/s LVOT diam:     2.30 cm   LV E/e' lateral: 6.4 LV SV:         160 LV SV Index:   69 LVOT Area:     4.15 cm  RIGHT VENTRICLE             IVC RV Basal diam:  4.70 cm     IVC diam: 2.30 cm RV Mid diam:    4.20 cm RV S prime:     18.60 cm/s TAPSE (M-mode): 2.8 cm LEFT ATRIUM           Index        RIGHT ATRIUM           Index LA diam:      3.40 cm 1.47 cm/m   RA Area:     19.10 cm LA Vol (A2C): 54.2 ml 23.46 ml/m  RA Volume:   57.00 ml  24.68 ml/m LA Vol (A4C): 48.1 ml 20.82 ml/m  AORTIC VALVE                     PULMONIC VALVE AV Area (Vmax):    3.59 cm      PV Vmax:       1.38 m/s AV Area (Vmean):   3.69 cm      PV Peak grad:  7.6 mmHg AV Area (VTI):     4.20 cm AV Vmax:           224.50 cm/s AV Vmean:          148.500 cm/s AV VTI:            0.381 m AV Peak Grad:      20.2 mmHg AV Mean Grad:      10.0 mmHg LVOT Vmax:         194.00 cm/s LVOT Vmean:        132.000 cm/s LVOT VTI:          0.385 m LVOT/AV VTI ratio: 1.01  AORTA Ao Root diam: 3.80 cm Ao Asc diam:  4.20 cm MITRAL VALVE MV Area (PHT): 3.27 cm    SHUNTS MV Decel Time: 232 msec    Systemic VTI:  0.38 m MV E  velocity: 78.30 cm/s  Systemic Diam: 2.30 cm MV A velocity: 88.10 cm/s MV E/A ratio:  0.89 Eleonore Chiquito MD Electronically signed by Eleonore Chiquito MD Signature Date/Time: 11/09/2021/12:26:45 PM    Final     Anti-infectives: Anti-infectives (From admission, onward)    Start     Dose/Rate Route Frequency Ordered Stop   11/08/21 0730  vancomycin (VANCOREADY) IVPB 1750 mg/350 mL        1,750 mg 175 mL/hr over 120 Minutes Intravenous  Once 11/08/21 0641 11/08/21 0908   11/08/21 0641  vancomycin variable dose per unstable renal function (pharmacist dosing)  Status:  Discontinued         Does not apply See admin instructions 11/08/21 0641 11/08/21 1138   11/07/21 2000  piperacillin-tazobactam (ZOSYN) IVPB 3.375 g        3.375 g 12.5 mL/hr over 240 Minutes Intravenous Every 8 hours 11/07/21 1921     11/07/21 1430  piperacillin-tazobactam (ZOSYN) IVPB 3.375 g  3.375 g 100 mL/hr over 30 Minutes Intravenous  Once 11/07/21 1418 11/07/21 1535        Assessment/Plan Septic shock - off pressors  Multiple intraabdominal abscesses - ?likely diverticular - s/p IR drain placement 6/5 - drainage purulent, Cx with moderate GNR/mixed anaerobic flora - consider possible repeat CT tomorrow AM to determine if any further drain placement indicated given multiple loculated collections on initial scans  - WBC 14, afebrile, continue IV abx - ttp along left and lower abdomen, no peritonitis and abdomen is soft - no indication for emergent surgical intervention this AM but will continue to monitor closely - drainage from UGI tract bilious today, hgb 13, can convert PPI to BID  FEN: NPO, continue to decompress UGI tract, IVF per CCM VTE: SCDs ID: Zosyn 6/4>>  - per CCM -  A. Fib with RVR - developed overnight, on cardizem gtt this AM AKI - Cr 1.98, improving  Acute respiratory failure with hypoxia - HFNC, per CCM OSA Morbid obesity - BMI 43.01  LOS: 4 days   I reviewed nursing notes, Consultant IR  notes, last 24 h vitals and pain scores, last 48 h intake and output, last 24 h labs and trends, and CCM notes .     Norm Parcel, [facility] 11/11/2021, 7:17 AM Please see Amion for pager number during day hours 7:00am-4:30pm

## 2021-11-11 NOTE — Progress Notes (Signed)
[facility] Progress Note Patient Name: Robert Larson DOB: 06-26-1955 MRN: [id number]   Date of Service  11/11/2021  HPI/Events of Note  ABG = 7.48/33/65/24.6. As suspected, the ETpCO2 dose not correlate with the pCO2 on the ABG.  eICU Interventions  Plan: Continue present management. Would have biomedical technical people troubleshoot bedside ETpCO2 monitor.     Intervention Category Major Interventions: Other:  Lysle Dingwall 11/11/2021, 8:56 PM

## 2021-11-11 NOTE — Progress Notes (Addendum)
ETCO2 noted 9 and below, verified x3 devices.  Fentanyl PCA turned off and Elink notified.  Pt taking 2-3 dose q4 hours at 15 mcg each; PCA is demand only, not continuous.

## 2021-11-11 NOTE — Progress Notes (Signed)
Pt refused CPAP qhs. Pt unable to tolerate due to NG tube in nose

## 2021-11-12 ENCOUNTER — Inpatient Hospital Stay (HOSPITAL_COMMUNITY): Payer: Commercial Managed Care - HMO

## 2021-11-12 ENCOUNTER — Other Ambulatory Visit (HOSPITAL_COMMUNITY): Payer: Self-pay

## 2021-11-12 DIAGNOSIS — K651 Peritoneal abscess: Secondary | ICD-10-CM | POA: Diagnosis not present

## 2021-11-12 LAB — GLUCOSE, CAPILLARY
Glucose-Capillary: 108 mg/dL — ABNORMAL HIGH (ref 70–99)
Glucose-Capillary: 111 mg/dL — ABNORMAL HIGH (ref 70–99)
Glucose-Capillary: 115 mg/dL — ABNORMAL HIGH (ref 70–99)
Glucose-Capillary: 117 mg/dL — ABNORMAL HIGH (ref 70–99)
Glucose-Capillary: 129 mg/dL — ABNORMAL HIGH (ref 70–99)
Glucose-Capillary: 130 mg/dL — ABNORMAL HIGH (ref 70–99)
Glucose-Capillary: 145 mg/dL — ABNORMAL HIGH (ref 70–99)

## 2021-11-12 LAB — CULTURE, BLOOD (ROUTINE X 2)
Culture: NO GROWTH
Culture: NO GROWTH
Special Requests: ADEQUATE
Special Requests: ADEQUATE

## 2021-11-12 LAB — PROTIME-INR
INR: 1.3 — ABNORMAL HIGH (ref 0.8–1.2)
Prothrombin Time: 16.4 seconds — ABNORMAL HIGH (ref 11.4–15.2)

## 2021-11-12 LAB — TSH: TSH: 3.48 u[IU]/mL (ref 0.350–4.500)

## 2021-11-12 LAB — BASIC METABOLIC PANEL
Anion gap: 8 (ref 5–15)
BUN: 56 mg/dL — ABNORMAL HIGH (ref 8–23)
CO2: 25 mmol/L (ref 22–32)
Calcium: 8.4 mg/dL — ABNORMAL LOW (ref 8.9–10.3)
Chloride: 116 mmol/L — ABNORMAL HIGH (ref 98–111)
Creatinine, Ser: 1.54 mg/dL — ABNORMAL HIGH (ref 0.61–1.24)
GFR, Estimated: 50 mL/min — ABNORMAL LOW (ref >60)
Glucose, Bld: 124 mg/dL — ABNORMAL HIGH (ref 70–99)
Potassium: 3.7 mmol/L (ref 3.5–5.1)
Sodium: 149 mmol/L — ABNORMAL HIGH (ref 135–145)

## 2021-11-12 LAB — CBC
HCT: 40.4 % (ref 39.0–52.0)
Hemoglobin: 12.9 g/dL — ABNORMAL LOW (ref 13.0–17.0)
MCH: 31.6 pg (ref 26.0–34.0)
MCHC: 31.9 g/dL (ref 30.0–36.0)
MCV: 99 fL (ref 80.0–100.0)
Platelets: 177 10*3/uL (ref 150–400)
RBC: 4.08 MIL/uL — ABNORMAL LOW (ref 4.22–5.81)
RDW: 14.5 % (ref 11.5–15.5)
WBC: 14.6 10*3/uL — ABNORMAL HIGH (ref 4.0–10.5)
nRBC: 0 % (ref 0.0–0.2)

## 2021-11-12 LAB — HEPARIN LEVEL (UNFRACTIONATED): Heparin Unfractionated: <0.1 IU/mL — ABNORMAL LOW (ref 0.30–0.70)

## 2021-11-12 LAB — PHOSPHORUS: Phosphorus: 2.8 mg/dL (ref 2.5–4.6)

## 2021-11-12 LAB — MAGNESIUM: Magnesium: 2.3 mg/dL (ref 1.7–2.4)

## 2021-11-12 MED ORDER — IPRATROPIUM-ALBUTEROL 0.5-2.5 (3) MG/3ML IN SOLN: 3.0000 mL | RESPIRATORY_TRACT | Status: DC | PRN

## 2021-11-12 MED ORDER — IOHEXOL 9 MG/ML PO SOLN
ORAL | Status: AC
  Filled 2021-11-12: qty 1000

## 2021-11-12 MED ORDER — IOHEXOL 9 MG/ML PO SOLN
500.0000 mL | ORAL | Status: AC
  Administered 2021-11-12 (×2): 500 mL via ORAL

## 2021-11-12 MED ORDER — FUROSEMIDE 10 MG/ML IJ SOLN
20.0000 mg | Freq: Once | INTRAMUSCULAR | Status: AC
  Administered 2021-11-12: 20 mg via INTRAVENOUS
  Filled 2021-11-12: qty 2

## 2021-11-12 MED ORDER — HEPARIN BOLUS VIA INFUSION
4000.0000 [IU] | Freq: Once | INTRAVENOUS | Status: DC
Start: 2021-11-12 — End: 2021-11-12
  Filled 2021-11-12: qty 4000

## 2021-11-12 MED ORDER — HYDRALAZINE HCL 20 MG/ML IJ SOLN
10.0000 mg | INTRAMUSCULAR | Status: DC | PRN
  Administered 2021-11-14 – 2021-11-29 (×3): 10 mg via INTRAVENOUS
  Filled 2021-11-12 (×3): qty 1

## 2021-11-12 MED ORDER — DM-GUAIFENESIN ER 30-600 MG PO TB12: 1.0000 | ORAL_TABLET | Freq: Two times a day (BID) | ORAL | Status: DC | PRN

## 2021-11-12 MED ORDER — HEPARIN BOLUS VIA INFUSION
3000.0000 [IU] | Freq: Once | INTRAVENOUS | Status: AC
Start: 2021-11-12 — End: 2021-11-12
  Administered 2021-11-12: 3000 [IU] via INTRAVENOUS
  Filled 2021-11-12: qty 3000

## 2021-11-12 MED ORDER — IPRATROPIUM-ALBUTEROL 0.5-2.5 (3) MG/3ML IN SOLN
3.0000 mL | Freq: Three times a day (TID) | RESPIRATORY_TRACT | Status: DC
  Administered 2021-11-12 – 2021-11-15 (×8): 3 mL via RESPIRATORY_TRACT
  Filled 2021-11-12 (×9): qty 3

## 2021-11-12 MED ORDER — SENNOSIDES-DOCUSATE SODIUM 8.6-50 MG PO TABS: 1.0000 | ORAL_TABLET | Freq: Every evening | ORAL | Status: DC | PRN

## 2021-11-12 MED ORDER — TRAZODONE HCL 50 MG PO TABS
50.0000 mg | ORAL_TABLET | Freq: Every evening | ORAL | Status: DC | PRN
  Administered 2021-11-14 – 2021-11-25 (×4): 50 mg via ORAL
  Filled 2021-11-12 (×5): qty 1

## 2021-11-12 MED ORDER — METOPROLOL TARTRATE 25 MG PO TABS
25.0000 mg | ORAL_TABLET | Freq: Two times a day (BID) | ORAL | Status: DC
  Administered 2021-11-12 – 2021-11-13 (×3): 25 mg via ORAL
  Filled 2021-11-12 (×3): qty 1

## 2021-11-12 MED ORDER — METOPROLOL TARTRATE 5 MG/5ML IV SOLN: 5.0000 mg | INTRAVENOUS | Status: DC | PRN

## 2021-11-12 MED ORDER — HEPARIN (PORCINE) 25000 UT/250ML-% IV SOLN
2300.0000 [IU]/h | INTRAVENOUS | Status: DC
  Administered 2021-11-12: 1350 [IU]/h via INTRAVENOUS
  Administered 2021-11-13: 2300 [IU]/h via INTRAVENOUS
  Administered 2021-11-13: 1650 [IU]/h via INTRAVENOUS
  Filled 2021-11-12 (×3): qty 250

## 2021-11-12 NOTE — Progress Notes (Signed)
Progress Note  Patient Name: Robert Larson Date of Encounter: 11/12/2021  [facility] Cardiologist: None   Subjective   His family is by the bedside. No cardiac symptoms  Inpatient Medications    Scheduled Meds:  alteplase  2 mg Intracatheter Once   aspirin  150 mg Rectal Daily   chlorhexidine  15 mL Mouth Rinse BID   Chlorhexidine Gluconate Cloth  6 each Topical Daily   fentaNYL   Intravenous Q4H   insulin aspart  0-15 Units Subcutaneous TID WC   iohexol       ipratropium-albuterol  3 mL Nebulization TID   lip balm  1 application  Topical BID   mouth rinse  15 mL Mouth Rinse q12n4p   pantoprazole (PROTONIX) IV  40 mg Intravenous Q12H   sodium chloride flush  10-40 mL Intracatheter Q12H   sodium chloride flush  3 mL Intravenous Q12H   sodium chloride flush  5 mL Intracatheter Q8H   Continuous Infusions:  sodium chloride 250 mL (11/08/21 0653)   sodium chloride Stopped (11/09/21 1144)   sodium chloride 10 mL/hr at 11/12/21 0800   diltiazem (CARDIZEM) infusion 15 mg/hr (11/12/21 0800)   ondansetron (ZOFRAN) IV     piperacillin-tazobactam (ZOSYN)  IV Stopped (11/12/21 0746)   PRN Meds: Place/Maintain arterial line **AND** sodium chloride, sodium chloride, acetaminophen **OR** [DISCONTINUED] acetaminophen, alum & mag hydroxide-simeth, dextromethorphan-guaiFENesin, diphenhydrAMINE, hydrALAZINE, iohexol, ipratropium-albuterol, magic mouthwash, menthol-cetylpyridinium, metoprolol tartrate, naloxone **AND** sodium chloride flush, ondansetron (ZOFRAN) IV **OR** ondansetron (ZOFRAN) IV, phenol, prochlorperazine, senna-docusate, simethicone, sodium chloride flush, traZODone   Vital Signs    Vitals:   11/12/21 0717 11/12/21 0732 11/12/21 0754 11/12/21 0800  BP:    126/63  Pulse:    70  Resp:   (!) 21 (!) 24  Temp:  98.1 F (36.7 C)    TempSrc:  Oral    SpO2: 92%  91% 90%  Weight:      Height:        Intake/Output Summary (Last 24 hours) at 11/12/2021 1001 Last data  filed at 11/12/2021 0800 Gross per 24 hour  Intake 1888.79 ml  Output 2715 ml  Net -826.21 ml      11/11/2021   12:00 AM 11/09/2021   12:00 AM 11/07/2021    6:00 PM  Last 3 Weights  Weight (lbs) 273 lb 5.9 oz 274 lb 0.5 oz 270 lb 8.1 oz  Weight (kg) 124 kg 124.3 kg 122.7 kg      Telemetry    Afib  predominantly rate controlled - Personally Reviewed  ECG    NA - Personally Reviewed  Physical Exam   Vitals:   11/12/21 0754 11/12/21 0800  BP:  126/63  Pulse:  70  Resp: (!) 21 (!) 24  Temp:    SpO2: 91% 90%    GEN: No acute distress.  NG tube in place Neck: + JVD ~ 10 cm Cardiac: irregular rate, no murmurs, rubs, or gallops.  Respiratory: Clear to auscultation bilaterally. GI: Soft, nontender, non-distended  MS: No edema; No deformity. Neuro:  Nonfocal  Psych: Normal affect   Labs    High Sensitivity Troponin:   Recent Labs  Lab 11/11/21 0000 11/11/21 0230  TROPONINIHS 116* 101*     Chemistry Recent Labs  Lab 11/09/21 0110 11/09/21 0400 11/09/21 1030 11/10/21 1205 11/11/21 0000 11/11/21 0704 11/12/21 0407  NA 137 139   < >  --  146* 146* 149*  K 3.9 3.5   < >  --  3.6 4.0 3.7  CL 102 102   < >  --  113* 112* 116*  CO2 14* 17*   < >  --  '24 26 25  '$ GLUCOSE 128* 141*   < >  --  112* 111* 124*  BUN 84* 92*   < >  --  79* 73* 56*  CREATININE 3.63* 3.77*   < >  --  2.10* 1.98* 1.54*  CALCIUM 8.2* 8.2*   < >  --  8.3* 8.6* 8.4*  MG  --   --   --  3.0* 2.9*  --  2.3  PROT 6.2* 5.9*  --   --   --  6.0*  --   ALBUMIN 2.2* 2.1*  --   --   --  1.9*  --   AST 99* 102*  --   --   --  88*  --   ALT 76* 76*  --   --   --  57*  --   ALKPHOS 42 40  --   --   --  52  --   BILITOT 1.5* 1.2  --   --   --  1.0  --   GFRNONAA 18* 17*   < >  --  35* 37* 50*  ANIONGAP 21* 20*   < >  --  '9 8 8   '$ < > = values in this interval not displayed.    Lipids No results for input(s): "CHOL", "TRIG", "HDL", "LABVLDL", "LDLCALC", "CHOLHDL" in the last 168 hours.  Hematology Recent  Labs  Lab 11/10/21 0250 11/11/21 0704 11/12/21 0407  WBC 15.4* 14.1* 14.6*  RBC 3.62* 4.02* 4.08*  HGB 11.7* 13.0 12.9*  HCT 35.0* 39.6 40.4  MCV 96.7 98.5 99.0  MCH 32.3 32.3 31.6  MCHC 33.4 32.8 31.9  RDW 14.1 14.6 14.5  PLT 168 172 177   Thyroid  Recent Labs  Lab 11/12/21 0500  TSH 3.480    BNPNo results for input(s): "BNP", "PROBNP" in the last 168 hours.  DDimer No results for input(s): "DDIMER" in the last 168 hours.   Radiology    DG Abd 1 View  Result Date: 11/11/2021 CLINICAL DATA:  Nasogastric tube placement EXAM: ABDOMEN - 1 VIEW COMPARISON:  11/09/2021 FINDINGS: A feeding tube is present with tip in the stomach body oriented towards the antrum. Retrocardiac airspace opacity in the lung bases. A pigtail catheter projects over the pelvis. The top of a left total hip prosthesis is partially visualized. Lower thoracic and lumbar spondylosis noted. IMPRESSION: 1. Feeding tube tip: Stomach body, oriented towards the antrum. 2. Retrocardiac airspace opacity on the left. Electronically Signed   By: Van Clines M.D.   On: 11/11/2021 08:48    Cardiac Studies   TTE 11/09/2021   1. Left ventricular ejection fraction, by estimation, is 60 to 65%. The  left ventricle has normal function. The left ventricle has no regional  wall motion abnormalities. There is mild concentric left ventricular  hypertrophy. Left ventricular diastolic  parameters were normal.   2. Right ventricular systolic function is normal. The right ventricular  size is mildly enlarged. Tricuspid regurgitation signal is inadequate for  assessing PA pressure.   3. The mitral valve is grossly normal. Trivial mitral valve  regurgitation. No evidence of mitral stenosis.   4. The aortic valve is tricuspid. There is mild calcification of the  aortic valve. Aortic valve regurgitation is not visualized. Aortic valve  sclerosis/calcification is present, without any evidence  of aortic  stenosis.   5. There is  mild dilatation of the ascending aorta, measuring 42 mm.   6. The inferior vena cava is dilated in size with >50% respiratory  variability, suggesting right atrial pressure of 8 mmHg.  Patient Profile     [name] is a 66 y.o. male with a hx of HTN, type 2 DM, CKD II, osteoarthritis,  obesity, tobacco abuse, who is being seen 11/11/2021 for the evaluation of A fib RVR at the request of Dr Lamonte Sakai  Assessment & Plan    Newly diagnosed A-fib with RVR on 11/10/21 - improved control today - likely sepsis/dehydration mediated  - will check TSH - optimize electrolytes with goal of K >4 and Mag >2, correct dehydration, continue antibiotic/drain for infection - Echo from 11/09/21 with LVEF 60 to 65%, no RWMA, no significant valvular disease -Continue IV diltiazem for rate control, rate control is reasonable currently given acute sepsis/dehydration/pain, if difficult rate control, may start IV amiodarone gtt if rates not controlled but would prefer heparin (please discuss with surgery if there are any contraindications). For now, can dose with  IV metoprolol PRN for rates sustained > 120 - has mild JVD, O2 sat low off O2; can dose with lasix and transition to oral once he is off O2 - CHA2DS2-VASc Score = 2 ,  will hold off on heparin gtt with bowel perforation. Recommend AC on discharge.   Septic shock, resolved Multiple intra-abdominal abscess with possible diverticular perforation  Acute hypoxic respiratory failure AKI on CKD stage II Hypertension Type II diabetes OSA - per primary team   For questions or updates, please contact [facility] Please consult [email or web address] for contact info under        Signed, Janina Mayo, MD  11/12/2021, 10:01 AM

## 2021-11-12 NOTE — Progress Notes (Signed)
PROGRESS NOTE    Robert Larson  AYT:[id number] DOB: Oct 04, 1955 DOA: 11/07/2021 PCP: Jeanie Sewer, NP   Brief Narrative:  66 year old with history of DM2, HTN, CKD admitted to the hospital for severe sepsis due to intra-abdominal abscess.  CT abdomen pelvis showed multiple loculated fluid collection in the lower abdomen with a large cavity in the pelvis measuring 11.5 X6X 11.7 cm.  Due to septic shock he was transferred to the ICU on 6/5.  Patient was seen by IR and drain was placed.  Hospital course was also complicated by acute kidney injury, hypoxia and atrial fibrillation with RVR.   Assessment & Plan:  Principal Problem:   Intra-abdominal abscesses s/p perc drainage 11/08/2021 Active Problems:   Essential hypertension   Type 2 diabetes mellitus with morbid obesity (HCC)   Diverticulosis   Hyponatremia   Acute renal failure superimposed on chronic kidney disease (HCC)   Sleep apnea   Osteoarthritis   CKD (chronic kidney disease) stage 3, GFR 30-59 ml/min (HCC)   Adrenal nodule (HCC)   History of adenomatous polyp of colon   Spinal stenosis of lumbar region   Renal cyst   Cholelithiasis   Coffee ground emesis   Septic shock (HCC)   Tobacco abuse    Septic shock secondary to pulm intra-abdominal abscess - Shock physiology has improved with.  Status post drain placed by IR. - General surgery following - Antibiotics-Zosyn - Plan is for repeat CT today  Acute hypoxic respiratory distress History of obstructive sleep apnea - Combination of pulmonary edema, possible underlying infection - Nocturnal CPAP.  Supplemental oxygen as needed.  Atrial fibrillation with RVR - No prior history.  Echocardiogram within normal limits.  Seen by cardiology team - TSH- Normal - Heparin drip once cleared by Gen Sx. Currently on Cardizem drip, can use Amio drip if needed.   Acute kidney injury on CKD stage IIIa, resolved - Suspect ATN.  Baseline creatinine 1.6.  Creatinine peaked at  3.7.  Today 1.5. - With perfusion and IV fluid renal function is improved  Hyponatremia with elevated chloride - Likely from fluid effusion.  We will change it to half-normal saline.  Transaminitis - Suspect from septic shock.  This is improving  Acute anemia - PPI.  Hemoglobin now stable.  Hemoglobin stable at 12.9 - NG tube in place - General surgery following  Essential hypertension - Holding medication due to septic shock  Hyperglycemia - Sliding scale and Accu-Cheks      DVT prophylaxis: Heparin drip Code Status:  Family Communication:  Called daughter, no answer left a vm  Status is: Inpatient Remains inpatient appropriate because: Multiple ongoing issues, not stable for dc. Will be here for atleast 3-5 days     Subjective: A fib rvr over night needing cardizem drip.  No new complaints + cough.    Examination:  General exam: Appears calm and comfortable  Respiratory system: b/l rhonchi at the bases.  Cardiovascular system: irregularly irregular.  Gastrointestinal system: Abdomen is nondistended, soft and nontender. No organomegaly or masses felt. Normal bowel sounds heard. Central nervous system: Alert and oriented. No focal neurological deficits. Extremities: Symmetric 5 x 5 power. Skin: No rashes, lesions or ulcers Psychiatry: Judgement and insight appear normal. Mood & affect appropriate.     Objective: Vitals:   11/12/21 0530 11/12/21 0600 11/12/21 0630 11/12/21 0717  BP: 127/72 130/78 127/83   Pulse:  70 96   Resp: '18 19 19   '$ Temp:      TempSrc:  SpO2: 91% 94% 93% 92%  Weight:      Height:        Intake/Output Summary (Last 24 hours) at 11/12/2021 0740 Last data filed at 11/12/2021 0600 Gross per 24 hour  Intake 1474.93 ml  Output 2715 ml  Net -1240.07 ml   Filed Weights   11/07/21 1800 11/09/21 0000 11/11/21 0000  Weight: 122.7 kg 124.3 kg 124 kg     Data Reviewed:   CBC: Recent Labs  Lab 11/09/21 0110 11/09/21 0400  11/09/21 1708 11/09/21 2150 11/10/21 0250 11/11/21 0704 11/12/21 0407  WBC 22.8*   < > 16.8* 16.3* 15.4* 14.1* 14.6*  NEUTROABS 20.8*  --   --   --   --   --   --   HGB 14.6   < > 13.1 12.7* 11.7* 13.0 12.9*  HCT 44.3   < > 37.7* 37.7* 35.0* 39.6 40.4  MCV 96.5   < > 93.8 94.3 96.7 98.5 99.0  PLT 222   < > 195 187 168 172 177   < > = values in this interval not displayed.   Basic Metabolic Panel: Recent Labs  Lab 11/09/21 2150 11/10/21 0250 11/10/21 1205 11/11/21 0000 11/11/21 0704 11/12/21 0407  NA 140 141  --  146* 146* 149*  K 3.5 3.5  --  3.6 4.0 3.7  CL 108 109  --  113* 112* 116*  CO2 21* 22  --  '24 26 25  '$ GLUCOSE 130* 132*  --  112* 111* 124*  BUN 88* 90*  --  79* 73* 56*  CREATININE 2.87* 2.58*  --  2.10* 1.98* 1.54*  CALCIUM 7.9* 8.2*  --  8.3* 8.6* 8.4*  MG  --   --  3.0* 2.9*  --  2.3  PHOS  --   --   --   --   --  2.8   GFR: Estimated Creatinine Clearance: 61.1 mL/min (A) (by C-G formula based on SCr of 1.54 mg/dL (H)). Liver Function Tests: Recent Labs  Lab 11/07/21 1029 11/08/21 0501 11/09/21 0110 11/09/21 0400 11/11/21 0704  AST 42* 70* 99* 102* 88*  ALT 33 57* 76* 76* 57*  ALKPHOS 56 74 42 40 52  BILITOT 1.2 1.5* 1.5* 1.2 1.0  PROT 7.7 5.9* 6.2* 5.9* 6.0*  ALBUMIN 3.4* 2.2* 2.2* 2.1* 1.9*   Recent Labs  Lab 11/07/21 1029  LIPASE 40   No results for input(s): "AMMONIA" in the last 168 hours. Coagulation Profile: Recent Labs  Lab 11/08/21 0822 11/09/21 0616  INR 1.6* 2.1*   Cardiac Enzymes: No results for input(s): "CKTOTAL", "CKMB", "CKMBINDEX", "TROPONINI" in the last 168 hours. BNP (last 3 results) No results for input(s): "PROBNP" in the last 8760 hours. HbA1C: No results for input(s): "HGBA1C" in the last 72 hours. CBG: Recent Labs  Lab 11/11/21 1115 11/11/21 1616 11/11/21 1927 11/11/21 2353 11/12/21 0356  GLUCAP 119* 116* 127* 145* 130*   Lipid Profile: No results for input(s): "CHOL", "HDL", "LDLCALC", "TRIG",  "CHOLHDL", "LDLDIRECT" in the last 72 hours. Thyroid Function Tests: Recent Labs    11/12/21 0500  TSH 3.480   Anemia Panel: No results for input(s): "VITAMINB12", "FOLATE", "FERRITIN", "TIBC", "IRON", "RETICCTPCT" in the last 72 hours. Sepsis Labs: Recent Labs  Lab 11/08/21 0501 11/08/21 2345 11/09/21 0246 11/09/21 0730  LATICACIDVEN 5.2* 5.0* 4.9* 3.5*    Recent Results (from the past 240 hour(s))  Culture, blood (routine x 2)     Status: None (Preliminary result)  Collection Time: 11/07/21  3:10 PM   Specimen: BLOOD RIGHT FOREARM  Result Value Ref Range Status   Specimen Description   Final    BLOOD RIGHT FOREARM Performed at [facility], [address] [place], [place] [address]    Special Requests   Final    BOTTLES DRAWN AEROBIC AND ANAEROBIC Blood Culture adequate volume Performed at [facility], [address] [place], [place] [address]    Culture   Final    NO GROWTH 4 DAYS Performed at [facility], [place] [address] [place], [place] [address]    Report Status PENDING  Incomplete  Culture, blood (routine x 2)     Status: None (Preliminary result)   Collection Time: 11/07/21  3:12 PM   Specimen: BLOOD LEFT FOREARM  Result Value Ref Range Status   Specimen Description   Final    BLOOD LEFT FOREARM Performed at [facility], [address] [place], [place] [address]    Special Requests   Final    BOTTLES DRAWN AEROBIC AND ANAEROBIC Blood Culture adequate volume Performed at [facility], [address] [place], [place] [address]    Culture   Final    NO GROWTH 4 DAYS Performed at [facility], [place] [address], [place], [place] [address]    Report Status PENDING  Incomplete  Surgical PCR screen     Status: None   Collection Time: 11/08/21  2:06 AM   Specimen: Nasal Mucosa; Nasal Swab  Result Value Ref Range Status   MRSA, PCR NEGATIVE NEGATIVE Final    Staphylococcus aureus NEGATIVE NEGATIVE Final    Comment: (NOTE) The Xpert SA Assay (FDA approved for NASAL specimens in patients 78 years of age and older), is one component of a comprehensive surveillance program. It is not intended to diagnose infection nor to guide or monitor treatment. Performed at [facility], [place] [address] [place], [place] [address]   Aerobic/Anaerobic Culture w Gram Stain (surgical/deep wound)     Status: None   Collection Time: 11/08/21  1:25 PM   Specimen: Abscess  Result Value Ref Range Status   Specimen Description   Final    ABSCESS DEEP PELVIC DRAIN Performed at [place] [address] [place], [place] [address]    Special Requests   Final    NONE Performed at [facility], [place] [address], [place], [place] [address]    Gram Stain   Final    RARE WBC PRESENT,BOTH PMN AND MONONUCLEAR ABUNDANT GRAM POSITIVE COCCI IN PAIRS MODERATE GRAM NEGATIVE RODS RARE GRAM POSITIVE RODS Performed at [facility], [place] [address] [place],  [address]    Culture   Final    MODERATE ESCHERICHIA COLI MIXED ANAEROBIC FLORA PRESENT.  CALL LAB IF FURTHER IID REQUIRED.    Report Status 11/11/2021 FINAL  Final   Organism ID, Bacteria ESCHERICHIA COLI  Final      Susceptibility   Escherichia coli - MIC*    AMPICILLIN 4 SENSITIVE Sensitive     CEFAZOLIN <=4 SENSITIVE Sensitive     CEFEPIME <=0.12 SENSITIVE Sensitive     CEFTAZIDIME <=1 SENSITIVE Sensitive     CEFTRIAXONE <=0.25 SENSITIVE Sensitive     CIPROFLOXACIN <=0.25 SENSITIVE Sensitive     GENTAMICIN <=1 SENSITIVE Sensitive     IMIPENEM <=0.25 SENSITIVE Sensitive     TRIMETH/SULFA <=20 SENSITIVE Sensitive     AMPICILLIN/SULBACTAM <=2 SENSITIVE Sensitive  PIP/TAZO <=4 SENSITIVE Sensitive     * MODERATE ESCHERICHIA COLI         Radiology Studies: DG Abd 1 View  Result Date: 11/11/2021 CLINICAL DATA:  Nasogastric tube  placement EXAM: ABDOMEN - 1 VIEW COMPARISON:  11/09/2021 FINDINGS: A feeding tube is present with tip in the stomach body oriented towards the antrum. Retrocardiac airspace opacity in the lung bases. A pigtail catheter projects over the pelvis. The top of a left total hip prosthesis is partially visualized. Lower thoracic and lumbar spondylosis noted. IMPRESSION: 1. Feeding tube tip: Stomach body, oriented towards the antrum. 2. Retrocardiac airspace opacity on the left. Electronically Signed   By: Van Clines M.D.   On: 11/11/2021 08:48        Scheduled Meds:  alteplase  2 mg Intracatheter Once   aspirin  150 mg Rectal Daily   chlorhexidine  15 mL Mouth Rinse BID   Chlorhexidine Gluconate Cloth  6 each Topical Daily   fentaNYL   Intravenous Q4H   insulin aspart  0-15 Units Subcutaneous TID WC   iohexol  500 mL Oral Q1H   iohexol       ipratropium-albuterol  3 mL Nebulization QID   lip balm  1 application  Topical BID   mouth rinse  15 mL Mouth Rinse q12n4p   pantoprazole (PROTONIX) IV  40 mg Intravenous Q12H   sodium chloride flush  10-40 mL Intracatheter Q12H   sodium chloride flush  3 mL Intravenous Q12H   sodium chloride flush  5 mL Intracatheter Q8H   Continuous Infusions:  sodium chloride 250 mL (11/08/21 0653)   sodium chloride Stopped (11/09/21 1144)   sodium chloride 10 mL/hr at 11/12/21 0044   diltiazem (CARDIZEM) infusion 15 mg/hr (11/12/21 0347)   lactated ringers 50 mL/hr at 11/12/21 0044   ondansetron (ZOFRAN) IV     piperacillin-tazobactam (ZOSYN)  IV 3.375 g (11/12/21 0346)     LOS: 5 days   Time spent= 35 mins    Skyleen Bentley Arsenio Loader, MD [facility]  If 7PM-7AM, please contact night-coverage  11/12/2021, 7:40 AM

## 2021-11-12 NOTE — Progress Notes (Signed)
Progress Note     Subjective: Pt with stable abdominal exam, reports small BM overnight. UOP good. Plan for repeat CT AP today.   Objective: Vital signs in last 24 hours: Temp:  [98.1 F (36.7 C)-98.6 F (37 C)] 98.1 F (36.7 C) (06/09 0732) Pulse Rate:  [50-151] 70 (06/09 0800) Resp:  [16-29] 24 (06/09 0800) BP: (107-150)/(62-98) 126/63 (06/09 0800) SpO2:  [87 %-95 %] 90 % (06/09 0800) FiO2 (%):  [40 %] 40 % (06/09 0754) Last BM Date : 11/11/21  Intake/Output from previous day: 06/08 0701 - 06/09 0700 In: 1474.9 [I.V.:1220.7; IV Piggyback:254.3] Out: 2715 [Urine:2550; Emesis/NG output:150; Drains:15] Intake/Output this shift: Total I/O In: 583.7 [I.V.:535.4; IV Piggyback:48.3] Out: -   PE: General: pleasant, WD, obese male Heart: regular, rate, and rhythm.   Lungs: on high flow [place], WOB appears less labored today Abd: soft, less TTP along left flank and lower abdomen, no peritonitis or guarding, ND, bilious drainage from small bore feeding tube, TG drain with purulent fluid Psych: A&Ox3 with an appropriate affect.    Lab Results:  Recent Labs    11/11/21 0704 11/12/21 0407  WBC 14.1* 14.6*  HGB 13.0 12.9*  HCT 39.6 40.4  PLT 172 177    BMET Recent Labs    11/11/21 0704 11/12/21 0407  NA 146* 149*  K 4.0 3.7  CL 112* 116*  CO2 26 25  GLUCOSE 111* 124*  BUN 73* 56*  CREATININE 1.98* 1.54*  CALCIUM 8.6* 8.4*    PT/INR No results for input(s): "LABPROT", "INR" in the last 72 hours.  CMP     Component Value Date/Time   NA 149 (H) 11/12/2021 0407   NA 133 (A) 06/24/2021 0000   K 3.7 11/12/2021 0407   CL 116 (H) 11/12/2021 0407   CO2 25 11/12/2021 0407   GLUCOSE 124 (H) 11/12/2021 0407   BUN 56 (H) 11/12/2021 0407   BUN 18 06/24/2021 0000   CREATININE 1.54 (H) 11/12/2021 0407   CALCIUM 8.4 (L) 11/12/2021 0407   PROT 6.0 (L) 11/11/2021 0704   ALBUMIN 1.9 (L) 11/11/2021 0704   AST 88 (H) 11/11/2021 0704   ALT 57 (H) 11/11/2021 0704    ALKPHOS 52 11/11/2021 0704   BILITOT 1.0 11/11/2021 0704   GFRNONAA 50 (L) 11/12/2021 0407   GFRAA >60 02/19/2018 1123   Lipase     Component Value Date/Time   LIPASE 40 11/07/2021 1029       Studies/Results: DG Abd 1 View  Result Date: 11/11/2021 CLINICAL DATA:  Nasogastric tube placement EXAM: ABDOMEN - 1 VIEW COMPARISON:  11/09/2021 FINDINGS: A feeding tube is present with tip in the stomach body oriented towards the antrum. Retrocardiac airspace opacity in the lung bases. A pigtail catheter projects over the pelvis. The top of a left total hip prosthesis is partially visualized. Lower thoracic and lumbar spondylosis noted. IMPRESSION: 1. Feeding tube tip: Stomach body, oriented towards the antrum. 2. Retrocardiac airspace opacity on the left. Electronically Signed   By: Van Clines M.D.   On: 11/11/2021 08:48    Anti-infectives: Anti-infectives (From admission, onward)    Start     Dose/Rate Route Frequency Ordered Stop   11/08/21 0730  vancomycin (VANCOREADY) IVPB 1750 mg/350 mL        1,750 mg 175 mL/hr over 120 Minutes Intravenous  Once 11/08/21 0641 11/08/21 0908   11/08/21 0641  vancomycin variable dose per unstable renal function (pharmacist dosing)  Status:  Discontinued  Does not apply See admin instructions 11/08/21 0641 11/08/21 1138   11/07/21 2000  piperacillin-tazobactam (ZOSYN) IVPB 3.375 g        3.375 g 12.5 mL/hr over 240 Minutes Intravenous Every 8 hours 11/07/21 1921     11/07/21 1430  piperacillin-tazobactam (ZOSYN) IVPB 3.375 g        3.375 g 100 mL/hr over 30 Minutes Intravenous  Once 11/07/21 1418 11/07/21 1535        Assessment/Plan Septic shock - off pressors  Multiple intraabdominal abscesses - ?likely diverticular - s/p IR drain placement 6/5 - drainage purulent, Cx with moderate GNR/mixed anaerobic flora - repeat CT AP today  - WBC stable at 14, afebrile, continue IV abx - ttp along left and lower abdomen, no peritonitis and  abdomen is soft - no indication for emergent surgical intervention this AM but will continue to monitor closely  FEN: NPO, continue to decompress UGI tract, IVF per CCM VTE: SCDs ID: Zosyn 6/4>>  - per CCM -  A. Fib with RVR - developed overnight 6/7, on cardizem gtt this AM AKI - Cr 1.54, improving  Acute respiratory failure with hypoxia - HFNC, per CCM OSA Morbid obesity - BMI 43.01  LOS: 5 days   I reviewed nursing notes, Consultant IR notes, last 24 h vitals and pain scores, last 48 h intake and output, last 24 h labs and trends, and CCM notes .     Norm Parcel, [facility] 11/12/2021, 11:03 AM Please see Amion for pager number during day hours 7:00am-4:30pm

## 2021-11-12 NOTE — Progress Notes (Signed)
ANTICOAGULATION CONSULT NOTE  Pharmacy Consult for IV heparin Indication: atrial fibrillation  No Known Allergies  Patient Measurements: Height: 5' 6.93" (170 cm) Weight: 124 kg (273 lb 5.9 oz) IBW/kg (Calculated) : 65.94 Heparin Dosing Weight: 95 kg  Vital Signs: Temp: 98.5 F (36.9 C) (06/09 1100) Temp Source: Axillary (06/09 1100) BP: 132/71 (06/09 1300) Pulse Rate: 96 (06/09 1300)  Labs: Recent Labs    11/10/21 0250 11/11/21 0000 11/11/21 0230 11/11/21 0704 11/12/21 0407  HGB 11.7*  --   --  13.0 12.9*  HCT 35.0*  --   --  39.6 40.4  PLT 168  --   --  172 177  CREATININE 2.58* 2.10*  --  1.98* 1.54*  TROPONINIHS  --  116* 101*  --   --     Estimated Creatinine Clearance: 61.1 mL/min (A) (by C-G formula based on SCr of 1.54 mg/dL (H)).   Medical History: Past Medical History:  Diagnosis Date   Acute right-sided low back pain with bilateral sciatica 03/13/2017   Contusion of left hip and thigh 02/13/2017   Osteoarthritis    Protein in urine    Sleep apnea     Medications:  Facility-Administered Medications Prior to Admission  Medication Dose Route Frequency Provider Last Rate Last Admin   0.9 %  sodium chloride infusion  500 mL Intravenous Once Irene Shipper, MD       Medications Prior to Admission  Medication Sig Dispense Refill Last Dose   FARXIGA 10 MG TABS tablet Take 10 mg by mouth daily.   11/06/2021   metoprolol succinate (TOPROL-XL) 25 MG 24 hr tablet Take 1 tablet (25 mg total) by mouth at bedtime. START with 1/2 pill for one week, then increase to 1 full pill. (Patient taking differently: Take 25 mg by mouth at bedtime.) 30 tablet 2 11/06/2021 at 2200   dapagliflozin propanediol (FARXIGA) 5 MG TABS tablet Take 1 tablet (5 mg total) by mouth daily. (Patient not taking: Reported on 11/07/2021) 90 tablet 1 Not Taking   Dulaglutide (TRULICITY) 1.5 EH/6.3JS SOPN Inject 1.5 mg into the skin once a week. Start after you run out of the 0.'75mg'$  dose x 2 each week.  (Patient not taking: Reported on 11/07/2021) 0.5 mL 2 Not Taking   metFORMIN (GLUCOPHAGE-XR) 500 MG 24 hr tablet TAKE 1 TABLET BY MOUTH EVERY DAY WITH BREAKFAST (Patient not taking: Reported on 11/07/2021) 90 tablet 1 Not Taking   Omega-3 Fatty Acids (FISH OIL PO) Take 2 capsules by mouth daily. (Patient not taking: Reported on 11/07/2021)   Not Taking   telmisartan-hydrochlorothiazide (MICARDIS HCT) 80-25 MG tablet Take 1 tablet by mouth daily. (Patient not taking: Reported on 11/07/2021)   Not Taking   varenicline (CHANTIX) 0.5 MG tablet INCREASE TO 2 TABS IN AM, 1 TAB IN PM FOR 1-2 WEEKS, THEN 2 TABS AM AND 2 TABS PM. (Patient not taking: Reported on 11/07/2021) 90 tablet 1 Not Taking   Scheduled:   alteplase  2 mg Intracatheter Once   aspirin  150 mg Rectal Daily   chlorhexidine  15 mL Mouth Rinse BID   Chlorhexidine Gluconate Cloth  6 each Topical Daily   fentaNYL   Intravenous Q4H   heparin  4,000 Units Intravenous Once   insulin aspart  0-15 Units Subcutaneous TID WC   iohexol       ipratropium-albuterol  3 mL Nebulization TID   lip balm  1 application  Topical BID   mouth rinse  15 mL  Mouth Rinse q12n4p   pantoprazole (PROTONIX) IV  40 mg Intravenous Q12H   sodium chloride flush  10-40 mL Intracatheter Q12H   sodium chloride flush  3 mL Intravenous Q12H   sodium chloride flush  5 mL Intracatheter Q8H   PRN: Place/Maintain arterial line **AND** sodium chloride, sodium chloride, acetaminophen **OR** [DISCONTINUED] acetaminophen, alum & mag hydroxide-simeth, dextromethorphan-guaiFENesin, diphenhydrAMINE, hydrALAZINE, iohexol, ipratropium-albuterol, magic mouthwash, menthol-cetylpyridinium, metoprolol tartrate, naloxone **AND** sodium chloride flush, ondansetron (ZOFRAN) IV **OR** ondansetron (ZOFRAN) IV, phenol, prochlorperazine, senna-docusate, simethicone, sodium chloride flush, traZODone  Assessment: 50 yoM with PMH DM2, HTN, CKD, admitted for intra-abdominal abscess; IR drain placed but  otherwise conservative management per Surgery. Developed new Afib overnight 6/7, and Pharmacy consulted to dose IV heparin  Baseline INR minimally elevated, trending down after bumping earlier this admission d/t sepsis Prior anticoagulation: none  Significant events:  Today, 11/12/2021: CBC: Hgb slightly low but stable; Plt stable WNL AKI on CKD resolved (baseline 1.5-2) No bleeding or infusion issues per nursing  Goal of Therapy: Heparin level 0.3-0.7 units/ml Monitor platelets by anticoagulation protocol: Yes  Plan: Heparin 3000 units IV bolus x 1 Heparin 1350 units/hr IV infusion Check heparin level 8 hrs after start Daily CBC, daily heparin level once stable Monitor for signs of bleeding or thrombosis  Reuel Boom, PharmD, BCPS [phone] 11/12/2021, 1:33 PM

## 2021-11-12 NOTE — Progress Notes (Signed)
Referring Physician(s): Clent Demark  Supervising Physician: Corrie Mckusick  Patient Status:  [facility] - In-pt  Chief Complaint:   Pelvic abscess s/p R TG drain placement on 11/08/21 by Dr. Maryelizabeth Kaufmann   Subjective:   Patient sitting in bed, NAD. Daughter at bedside.  States that he is feeling better toiday, has been able to get out of bed with assistance.  Denies fever, chills, abdominal pain, nausea or vomiting. Informed the patient that the fluid collection where the drain is in has been resolved, but will keep the drain in for now, in case he develops recurrent pelvic fluid collection.  Patient and daughter verbalized understanding.    Allergies: Patient has no known allergies.  Medications: Prior to Admission medications   Medication Sig Start Date End Date Taking? Authorizing Provider  FARXIGA 10 MG TABS tablet Take 10 mg by mouth daily.   Yes [provider]  metoprolol succinate (TOPROL-XL) 25 MG 24 hr tablet Take 1 tablet (25 mg total) by mouth at bedtime. START with 1/2 pill for one week, then increase to 1 full pill. Patient taking differently: Take 25 mg by mouth at bedtime. 10/18/21  Yes Jeanie Sewer, NP  dapagliflozin propanediol (FARXIGA) 5 MG TABS tablet Take 1 tablet (5 mg total) by mouth daily. Patient not taking: Reported on 11/07/2021 10/14/21   Jeanie Sewer, NP  Dulaglutide (TRULICITY) 1.5 IR/4.4RX SOPN Inject 1.5 mg into the skin once a week. Start after you run out of the 0.'75mg'$  dose x 2 each week. Patient not taking: Reported on 11/07/2021 10/14/21   Jeanie Sewer, NP  metFORMIN (GLUCOPHAGE-XR) 500 MG 24 hr tablet TAKE 1 TABLET BY MOUTH EVERY DAY WITH BREAKFAST Patient not taking: Reported on 11/07/2021 10/14/21   Jeanie Sewer, NP  Omega-3 Fatty Acids (FISH OIL PO) Take 2 capsules by mouth daily. Patient not taking: Reported on 11/07/2021    [provider]  telmisartan-hydrochlorothiazide (MICARDIS HCT) 80-25 MG tablet Take 1 tablet by  mouth daily. Patient not taking: Reported on 11/07/2021    [provider]  varenicline (CHANTIX) 0.5 MG tablet INCREASE TO 2 TABS IN AM, 1 TAB IN PM FOR 1-2 WEEKS, THEN 2 TABS AM AND 2 TABS PM. Patient not taking: Reported on 11/07/2021 10/08/21   Jeanie Sewer, NP     Vital Signs: BP 126/63 (BP Location: Left Arm)   Pulse 70   Temp 98.5 F (36.9 C) (Axillary)   Resp (!) 24   Ht 5' 6.93" (1.7 m)   Wt 273 lb 5.9 oz (124 kg)   SpO2 95%   BMI 42.91 kg/m   Physical Exam Vitals and nursing note reviewed.  Constitutional:      General: Patient is not in acute distress.    Appearance: Normal appearance. Patient is not ill-appearing.  HENT:     Head: Normocephalic and atraumatic.  Pulmonary:     Effort: Pulmonary effort is normal.    Abdominal:      Palpations: Abdomen is soft.  Musculoskeletal:     Cervical back: Neck supple.  Skin:    General: Skin is warm and dry.     Coloration: Skin is not jaundiced or pale.  Neurological:     Mental Status: Patient is alert and oriented to person, place, and time.  Psychiatric:        Mood and Affect: Mood normal.        Behavior: Behavior normal.        Judgment: Judgment normal.    Imaging:  CT ABDOMEN PELVIS WO CONTRAST  Result Date: 11/12/2021 CLINICAL DATA:  Inpatient. Septic shock. Multiple intra-abdominal abscesses, possible perforated diverticulitis. Percutaneous CT-guided transgluteal pelvic drainage catheter placed 4 days prior. EXAM: CT ABDOMEN AND PELVIS WITHOUT CONTRAST TECHNIQUE: Multidetector CT imaging of the abdomen and pelvis was performed following the standard protocol without IV contrast. RADIATION DOSE REDUCTION: This exam was performed according to the departmental dose-optimization program which includes automated exposure control, adjustment of the mA and/or kV according to patient size and/or use of iterative reconstruction technique. COMPARISON:  11/07/2021 CT abdomen/pelvis. FINDINGS: Lower chest: New  complete left lower lobe atelectasis and increased moderate lingular atelectasis. Mild-to-moderate patchy ground-glass opacity throughout right middle lobe. Coronary atherosclerosis. Hepatobiliary: Diffuse hepatic steatosis. Diffusely irregular liver surface with relative hypertrophy of the lateral segment left liver lobe, compatible with cirrhosis. No liver masses. Normal gallbladder with no radiopaque cholelithiasis. No biliary ductal dilatation. Pancreas: Normal, with no mass or duct dilation. Spleen: Normal size. No mass. Adrenals/Urinary Tract: Left adrenal 2.5 cm nodule with density 29 HU, stable size. Right adrenal 1.5 cm nodule with density 21 HU, stable size. No hydronephrosis. No renal stones. Multiple simple bilateral renal cysts, largest 5.7 cm in anterior upper left kidney and subcentimeter hyperdense left renal cortical foci that are too small to characterize, unchanged, for which no follow-up is recommended. Small amount of gas in the non dependent bladder, which is mildly distended and otherwise normal. Stomach/Bowel: Enteric tube terminates in the body of the stomach. Stomach is nondistended and otherwise normal. Normal caliber small bowel with no small bowel wall thickening. Appendix appears normal. Oral contrast transits to the rectum. Scattered diverticulosis throughout the colon, most prominent in the sigmoid colon. Mild wall thickening and pericolonic fat stranding in the proximal sigmoid colon, similar. Vascular/Lymphatic: Atherosclerotic nonaneurysmal abdominal aorta. No pathologically enlarged lymph nodes in the abdomen or pelvis. Reproductive: Mild prostatomegaly. Other: Right transgluteal percutaneous pigtail drain terminates between the rectum and bladder to the left of midline, with no residual fluid collection in this location. Gas containing 6.4 x 4.8 x 5.0 cm (volume = 80 cm^3) right lower quadrant collection (series 2/image 60), previously 7.0 x 3.5 x 3.9 cm (volume = 50 cm^3) using  similar measurement technique, overall mildly increased. Irregular gas containing left lower quadrant mesenteric 8.0 x 5.7 cm collection (series 2/image 60), increased from 5.9 x 4.0 cm. Multiple new interloop fluid collections in the left greater than right small bowel mesentery, largest 5.8 x 3.5 cm in the left small bowel mesentery (series 2/image 45). Musculoskeletal: No aggressive appearing focal osseous lesions. Left total hip arthroplasty. Marked thoracolumbar spondylosis. IMPRESSION: 1. Multifocal intraperitoneal abscesses with mixed interval changes. 2. Right transgluteal percutaneous pigtail drain terminates between the rectum and bladder to the left of midline, with no residual fluid collection in this location. 3. Gas-containing right lower quadrant collection is mildly increased. Irregular gas-containing left lower quadrant mesenteric collection, increased. Multiple new interloop fluid collections in the small bowel mesentery. 4. New complete left lower lobe atelectasis and increased moderate lingular atelectasis. Mild-to-moderate patchy ground-glass opacity throughout the right middle lobe, nonspecific, probably inflammatory. 5. Cirrhosis. No liver masses. 6. Stable bilateral adrenal nodules with indeterminate density. Follow-up CT abdomen without and with IV contrast suggested in 12 months. 7. Small amount of gas in the non dependent bladder, nonspecific, correlate for history of recent bladder instrumentation. 8. Aortic Atherosclerosis (ICD10-I70.0). Electronically Signed   By: Ilona Sorrel M.D.   On: 11/12/2021 11:50   DG Abd 1 View  Result Date: 11/11/2021 CLINICAL DATA:  Nasogastric tube placement EXAM: ABDOMEN - 1 VIEW COMPARISON:  11/09/2021 FINDINGS: A feeding tube is present with tip in the stomach body oriented towards the antrum. Retrocardiac airspace opacity in the lung bases. A pigtail catheter projects over the pelvis. The top of a left total hip prosthesis is partially visualized.  Lower thoracic and lumbar spondylosis noted. IMPRESSION: 1. Feeding tube tip: Stomach body, oriented towards the antrum. 2. Retrocardiac airspace opacity on the left. Electronically Signed   By: Van Clines M.D.   On: 11/11/2021 08:48   DG CHEST PORT 1 VIEW  Result Date: 11/10/2021 CLINICAL DATA:  Hypoxia EXAM: PORTABLE CHEST 1 VIEW COMPARISON:  November 09, 2021 FINDINGS: The heart size and mediastinal contours are stable. Feeding tube is identified unchanged. Increased airspace opacities identified in both lungs, worsened compared prior exam. Patchy consolidation of bilateral lung bases are noted. The visualized skeletal structures are stable. IMPRESSION: Increased airspace opacities identified in both lungs, worsened compared prior exam. Patchy consolidation of bilateral lung bases. Electronically Signed   By: Abelardo Diesel M.D.   On: 11/10/2021 07:25   ECHOCARDIOGRAM COMPLETE  Result Date: 11/09/2021    ECHOCARDIOGRAM REPORT   Patient Name:   Robert Larson Date of Exam: 11/09/2021 Medical Rec #:  [id number]       Height:       66.9 in Accession #:    [id number]      Weight:       274.0 lb Date of Birth:  03-27-1956      BSA:          2.310 m Patient Age:    66 years        BP:           131/58 mmHg Patient Gender: M               HR:           71 bpm. Exam Location:  Inpatient Procedure: 2D Echo, Cardiac Doppler, Color Doppler and Intracardiac            Opacification Agent Indications:    CHF  History:        Patient has no prior history of Echocardiogram examinations.  Sonographer:    Joette Catching RCS Referring Phys: [id number] [place]  1. Left ventricular ejection fraction, by estimation, is 60 to 65%. The left ventricle has normal function. The left ventricle has no regional wall motion abnormalities. There is mild concentric left ventricular hypertrophy. Left ventricular diastolic parameters were normal.  2. Right ventricular systolic function is normal. The right ventricular  size is mildly enlarged. Tricuspid regurgitation signal is inadequate for assessing PA pressure.  3. The mitral valve is grossly normal. Trivial mitral valve regurgitation. No evidence of mitral stenosis.  4. The aortic valve is tricuspid. There is mild calcification of the aortic valve. Aortic valve regurgitation is not visualized. Aortic valve sclerosis/calcification is present, without any evidence of aortic stenosis.  5. There is mild dilatation of the ascending aorta, measuring 42 mm.  6. The inferior vena cava is dilated in size with >50% respiratory variability, suggesting right atrial pressure of 8 mmHg. FINDINGS  Left Ventricle: Left ventricular ejection fraction, by estimation, is 60 to 65%. The left ventricle has normal function. The left ventricle has no regional wall motion abnormalities. Definity contrast agent was given IV to delineate the left ventricular  endocardial borders. The left ventricular internal cavity size was normal in  size. There is mild concentric left ventricular hypertrophy. Left ventricular diastolic parameters were normal. Right Ventricle: The right ventricular size is mildly enlarged. No increase in right ventricular wall thickness. Right ventricular systolic function is normal. Tricuspid regurgitation signal is inadequate for assessing PA pressure. Left Atrium: Left atrial size was normal in size. Right Atrium: Right atrial size was normal in size. Pericardium: Trivial pericardial effusion is present. Mitral Valve: The mitral valve is grossly normal. Trivial mitral valve regurgitation. No evidence of mitral valve stenosis. Tricuspid Valve: The tricuspid valve is grossly normal. Tricuspid valve regurgitation is trivial. No evidence of tricuspid stenosis. Aortic Valve: The aortic valve is tricuspid. There is mild calcification of the aortic valve. Aortic valve regurgitation is not visualized. Aortic valve sclerosis/calcification is present, without any evidence of aortic stenosis.  Aortic valve mean gradient measures 10.0 mmHg. Aortic valve peak gradient measures 20.2 mmHg. Aortic valve area, by VTI measures 4.20 cm. Pulmonic Valve: The pulmonic valve was grossly normal. Pulmonic valve regurgitation is not visualized. No evidence of pulmonic stenosis. Aorta: The aortic root is normal in size and structure. There is mild dilatation of the ascending aorta, measuring 42 mm. Venous: The inferior vena cava is dilated in size with greater than 50% respiratory variability, suggesting right atrial pressure of 8 mmHg. IAS/Shunts: The atrial septum is grossly normal.  LEFT VENTRICLE PLAX 2D LVIDd:         4.50 cm   Diastology LVIDs:         3.30 cm   LV e' medial:    7.83 cm/s LV PW:         1.20 cm   LV E/e' medial:  10.0 LV IVS:        1.40 cm   LV e' lateral:   12.20 cm/s LVOT diam:     2.30 cm   LV E/e' lateral: 6.4 LV SV:         160 LV SV Index:   69 LVOT Area:     4.15 cm  RIGHT VENTRICLE             IVC RV Basal diam:  4.70 cm     IVC diam: 2.30 cm RV Mid diam:    4.20 cm RV S prime:     18.60 cm/s TAPSE (M-mode): 2.8 cm LEFT ATRIUM           Index        RIGHT ATRIUM           Index LA diam:      3.40 cm 1.47 cm/m   RA Area:     19.10 cm LA Vol (A2C): 54.2 ml 23.46 ml/m  RA Volume:   57.00 ml  24.68 ml/m LA Vol (A4C): 48.1 ml 20.82 ml/m  AORTIC VALVE                     PULMONIC VALVE AV Area (Vmax):    3.59 cm      PV Vmax:       1.38 m/s AV Area (Vmean):   3.69 cm      PV Peak grad:  7.6 mmHg AV Area (VTI):     4.20 cm AV Vmax:           224.50 cm/s AV Vmean:          148.500 cm/s AV VTI:            0.381 m AV Peak Grad:  20.2 mmHg AV Mean Grad:      10.0 mmHg LVOT Vmax:         194.00 cm/s LVOT Vmean:        132.000 cm/s LVOT VTI:          0.385 m LVOT/AV VTI ratio: 1.01  AORTA Ao Root diam: 3.80 cm Ao Asc diam:  4.20 cm MITRAL VALVE MV Area (PHT): 3.27 cm    SHUNTS MV Decel Time: 232 msec    Systemic VTI:  0.38 m MV E velocity: 78.30 cm/s  Systemic Diam: 2.30 cm MV A  velocity: 88.10 cm/s MV E/A ratio:  0.89 Eleonore Chiquito MD Electronically signed by Eleonore Chiquito MD Signature Date/Time: 11/09/2021/12:26:45 PM    Final    DG Chest Port 1 View  Result Date: 11/09/2021 CLINICAL DATA:  Hypoxia. EXAM: PORTABLE CHEST 1 VIEW COMPARISON:  November 08, 2021 FINDINGS: There is stable left internal jugular venous catheter positioning. Interval enteric tube placement is seen without visualization of its distal tip. The heart size and mediastinal contours are within normal limits. Mild diffusely increased interstitial lung markings are seen with mild perihilar prominence of the pulmonary vasculature. Mild bibasilar atelectasis is also noted. There is no evidence of a pleural effusion or pneumothorax. Multilevel degenerative changes are seen throughout the thoracic spine. IMPRESSION: 1. Mild pulmonary vascular congestion with increased interstitial lung markings, likely consistent with interstitial edema. 2. Mild bibasilar atelectasis. Electronically Signed   By: Virgina Norfolk M.D.   On: 11/09/2021 02:25   DG Abd 1 View  Result Date: 11/09/2021 CLINICAL DATA:  Nasogastric tube placement. EXAM: ABDOMEN - 1 VIEW COMPARISON:  November 08, 2021 FINDINGS: A feeding tube is present with its radiopaque weighted tip overlying the region of the gastric antrum. This represents a new finding. Nonspecific air-filled loops of bowel are seen within the left lower quadrant. No radio-opaque calculi or other significant radiographic abnormality are identified. A percutaneous surgical drain is seen with its distal tip overlying the midline of the pelvis. An intact left hip replacement is noted. IMPRESSION: Interval feeding tube placement and positioning, as described above. Electronically Signed   By: Virgina Norfolk M.D.   On: 11/09/2021 01:11   CT IMAGE GUIDED DRAINAGE BY PERCUTANEOUS CATHETER  Result Date: 11/08/2021 INDICATION: Intra-abdominal and pelvic abscess.  Sepsis. EXAM: CT GUIDED RIGHT  TRANSGLUTEAL APPROACH ABSCESS DRAINAGE CATHETER PLACEMENT RADIATION DOSE REDUCTION: This exam was performed according to the departmental dose-optimization program which includes automated exposure control, adjustment of the mA and/or kV according to patient size and/or use of iterative reconstruction technique. COMPARISON:  None Available. MEDICATIONS: The patient is currently admitted to the hospital and receiving intravenous antibiotics. The antibiotics were administered within an appropriate time frame prior to the initiation of the procedure. ANESTHESIA/SEDATION: Moderate (conscious) sedation was employed during this procedure. A total of Versed 1 mg and Fentanyl 100 mcg was administered intravenously. Moderate Sedation Time: 27 minutes. The patient's level of consciousness and vital signs were monitored continuously by radiology nursing throughout the procedure under my direct supervision. CONTRAST:  None COMPLICATIONS: None immediate. PROCEDURE: Informed written consent was obtained from the patient and/or patient's representative after a discussion of the risks, benefits and alternatives to treatment. The patient was placed prone on the CT gantry and a pre procedural CT was performed re-demonstrating the known abscess/fluid collection within the deep pelvis. The procedure was planned. A timeout was performed prior to the initiation of the procedure. The RIGHT gluteal soft tissue was prepped  and draped in the usual sterile fashion. The overlying soft tissues were anesthetized with 1% lidocaine with epinephrine. Appropriate trajectory was planned with the use of a 22 gauge spinal needle. An 18 gauge trocar needle was advanced into the abscess/fluid collection and a short Amplatz super stiff wire was coiled within the collection. Appropriate positioning was confirmed with a limited CT scan. The tract was serially dilated allowing placement of a 12 Fr drainage catheter. Appropriate positioning was confirmed with  a limited postprocedural CT scan. Feculent fluid was aspirated. The tube was connected to a drainage bag and sutured in place. A dressing was placed. The patient tolerated the procedure well without immediate post procedural complication. IMPRESSION: Successful CT guided placement of a 12 Fr RIGHT transgluteal approach pelvis drainage catheter, as above. Samples were sent to the laboratory as requested by the ordering clinical team. Michaelle Birks, MD [facility] Electronically Signed   By: Michaelle Birks M.D.   On: 11/08/2021 17:30    Labs:  CBC: Recent Labs    11/09/21 2150 11/10/21 0250 11/11/21 0704 11/12/21 0407  WBC 16.3* 15.4* 14.1* 14.6*  HGB 12.7* 11.7* 13.0 12.9*  HCT 37.7* 35.0* 39.6 40.4  PLT 187 168 172 177    COAGS: Recent Labs    11/08/21 0822 11/09/21 0616  INR 1.6* 2.1*    BMP: Recent Labs    11/10/21 0250 11/11/21 0000 11/11/21 0704 11/12/21 0407  NA 141 146* 146* 149*  K 3.5 3.6 4.0 3.7  CL 109 113* 112* 116*  CO2 '22 24 26 25  '$ GLUCOSE 132* 112* 111* 124*  BUN 90* 79* 73* 56*  CALCIUM 8.2* 8.3* 8.6* 8.4*  CREATININE 2.58* 2.10* 1.98* 1.54*  GFRNONAA 27* 35* 37* 50*    LIVER FUNCTION TESTS: Recent Labs    11/08/21 0501 11/09/21 0110 11/09/21 0400 11/11/21 0704  BILITOT 1.5* 1.5* 1.2 1.0  AST 70* 99* 102* 88*  ALT 57* 76* 76* 57*  ALKPHOS 74 42 40 52  PROT 5.9* 6.2* 5.9* 6.0*  ALBUMIN 2.2* 2.2* 2.1* 1.9*    Assessment and Plan: Pt with hx of septic shock, multiple intraabdominal/pelvic abscesses; s/p right TG/pelvic drain placed on 6/5 by Dr. Maryelizabeth Kaufmann   WBC 14.6 (14.1 yesterday, 15.4 the day before) Afebrile  Cx e coli  Output 15 mL   Drain Location: R TG Size: Fr size: 12 Fr Date of placement: 6/5  Currently to: Drain collection device: gravity 24 hour output:  Output by Drain (mL) 11/10/21 0701 - 11/10/21 1900 11/10/21 1901 - 11/11/21 0700 11/11/21 0701 - 11/11/21 1900  11/11/21 1901 - 11/12/21 0700 11/12/21 0701 - 11/12/21 1241  Open Drain 1 Right Buttock 12 Fr. '15 10 10 5     '$ Interval imaging/drain manipulation:  6/9 CT AP w/o 1. Multifocal intraperitoneal abscesses with mixed interval changes. 2. Right transgluteal percutaneous pigtail drain terminates between the rectum and bladder to the left of midline, with no residual fluid collection in this location. 3. Gas-containing right lower quadrant collection is mildly increased. Irregular gas-containing left lower quadrant mesenteric collection, increased. Multiple new interloop fluid collections in the small bowel mesentery. 4. New complete left lower lobe atelectasis and increased moderate lingular atelectasis. Mild-to-moderate patchy ground-glass opacity throughout the right middle lobe, nonspecific, probably inflammatory. 5. Cirrhosis. No liver masses. 6. Stable bilateral adrenal nodules with indeterminate density. Follow-up CT abdomen without and with IV contrast suggested in 12 months. 7. Small amount of gas in the non dependent  bladder, nonspecific, correlate for history of recent bladder instrumentation. 8. Aortic Atherosclerosis (ICD10-I70.0).   Plan:  CT reviewed with Dr. Earleen Newport.  The pelvic collection where the drain is located has resolved, drain removal w/o further imaging can be considered; however, patient is still sick with possible decompensation, will hold drain removal until patient is out of ICU.   Stop drain flushing.  Document OP q shift.    IR will continue to follow - please call with questions or concerns.   Electronically Signed: Tera Mater, PA-C/Matthew Meliton Rattan 11/12/2021, 12:40 PM   I spent a total of 15 Minutes at the the patient's bedside AND on the patient's hospital floor or unit, greater than 50% of which was counseling/coordinating care for pelvic abscess drain.

## 2021-11-12 NOTE — TOC Benefit Eligibility Note (Signed)
Patient Teacher, English as a foreign language completed.    The patient is currently admitted and upon discharge could be taking Eliquis 5 mg.  Requires Prior Authorization  The patient is currently admitted and upon discharge could be taking Xarelto 20 mg.  Requires Prior Authorization  The patient is insured through [place] Patient Advocate Specialist [place] Patient Advocate Team Direct Number: [phone]  Fax: [phone]

## 2021-11-12 NOTE — Progress Notes (Signed)
Pt refused CPAP qhs.  Pt encouraged to contact RT should he change his mind.   

## 2021-11-13 DIAGNOSIS — I480 Paroxysmal atrial fibrillation: Secondary | ICD-10-CM

## 2021-11-13 DIAGNOSIS — K651 Peritoneal abscess: Secondary | ICD-10-CM | POA: Diagnosis not present

## 2021-11-13 LAB — GLUCOSE, CAPILLARY
Glucose-Capillary: 102 mg/dL — ABNORMAL HIGH (ref 70–99)
Glucose-Capillary: 110 mg/dL — ABNORMAL HIGH (ref 70–99)
Glucose-Capillary: 111 mg/dL — ABNORMAL HIGH (ref 70–99)
Glucose-Capillary: 111 mg/dL — ABNORMAL HIGH (ref 70–99)
Glucose-Capillary: 113 mg/dL — ABNORMAL HIGH (ref 70–99)
Glucose-Capillary: 120 mg/dL — ABNORMAL HIGH (ref 70–99)

## 2021-11-13 LAB — COMPREHENSIVE METABOLIC PANEL
ALT: 48 U/L — ABNORMAL HIGH (ref 0–44)
AST: 68 U/L — ABNORMAL HIGH (ref 15–41)
Albumin: 1.9 g/dL — ABNORMAL LOW (ref 3.5–5.0)
Alkaline Phosphatase: 45 U/L (ref 38–126)
Anion gap: 5 (ref 5–15)
BUN: 49 mg/dL — ABNORMAL HIGH (ref 8–23)
CO2: 26 mmol/L (ref 22–32)
Calcium: 8.1 mg/dL — ABNORMAL LOW (ref 8.9–10.3)
Chloride: 117 mmol/L — ABNORMAL HIGH (ref 98–111)
Creatinine, Ser: 1.62 mg/dL — ABNORMAL HIGH (ref 0.61–1.24)
GFR, Estimated: 47 mL/min — ABNORMAL LOW (ref >60)
Glucose, Bld: 112 mg/dL — ABNORMAL HIGH (ref 70–99)
Potassium: 3.7 mmol/L (ref 3.5–5.1)
Sodium: 148 mmol/L — ABNORMAL HIGH (ref 135–145)
Total Bilirubin: 1.1 mg/dL (ref 0.3–1.2)
Total Protein: 6.1 g/dL — ABNORMAL LOW (ref 6.5–8.1)

## 2021-11-13 LAB — CBC
HCT: 40 % (ref 39.0–52.0)
Hemoglobin: 12.7 g/dL — ABNORMAL LOW (ref 13.0–17.0)
MCH: 31.5 pg (ref 26.0–34.0)
MCHC: 31.8 g/dL (ref 30.0–36.0)
MCV: 99.3 fL (ref 80.0–100.0)
Platelets: 181 10*3/uL (ref 150–400)
RBC: 4.03 MIL/uL — ABNORMAL LOW (ref 4.22–5.81)
RDW: 14.5 % (ref 11.5–15.5)
WBC: 16.7 10*3/uL — ABNORMAL HIGH (ref 4.0–10.5)
nRBC: 0 % (ref 0.0–0.2)

## 2021-11-13 LAB — MAGNESIUM: Magnesium: 1.9 mg/dL (ref 1.7–2.4)

## 2021-11-13 LAB — HEPARIN LEVEL (UNFRACTIONATED)
Heparin Unfractionated: <0.1 IU/mL — ABNORMAL LOW (ref 0.30–0.70)
Heparin Unfractionated: 0.18 IU/mL — ABNORMAL LOW (ref 0.30–0.70)

## 2021-11-13 MED ORDER — METOPROLOL TARTRATE 50 MG PO TABS
50.0000 mg | ORAL_TABLET | Freq: Two times a day (BID) | ORAL | Status: AC
  Administered 2021-11-13 – 2021-11-14 (×3): 50 mg via ORAL
  Filled 2021-11-13: qty 2
  Filled 2021-11-13: qty 1
  Filled 2021-11-13: qty 2

## 2021-11-13 MED ORDER — ASPIRIN 81 MG PO TBEC
81.0000 mg | DELAYED_RELEASE_TABLET | Freq: Every day | ORAL | Status: DC
  Administered 2021-11-13: 81 mg via ORAL
  Filled 2021-11-13: qty 1

## 2021-11-13 MED ORDER — HEPARIN BOLUS VIA INFUSION
3000.0000 [IU] | Freq: Once | INTRAVENOUS | Status: AC
Start: 2021-11-13 — End: 2021-11-13
  Administered 2021-11-13: 3000 [IU] via INTRAVENOUS
  Filled 2021-11-13: qty 3000

## 2021-11-13 MED ORDER — HEPARIN (PORCINE) 25000 UT/250ML-% IV SOLN
2600.0000 [IU]/h | INTRAVENOUS | Status: DC
Start: 2021-11-13 — End: 2021-11-18
  Administered 2021-11-13 – 2021-11-18 (×10): 2600 [IU]/h via INTRAVENOUS
  Filled 2021-11-13 (×12): qty 250

## 2021-11-13 MED ORDER — HEPARIN BOLUS VIA INFUSION
3000.0000 [IU] | Freq: Once | INTRAVENOUS | Status: AC
  Administered 2021-11-13: 3000 [IU] via INTRAVENOUS
  Filled 2021-11-13: qty 3000

## 2021-11-13 MED ORDER — SODIUM CHLORIDE 0.45 % IV SOLN: INTRAVENOUS | Status: DC

## 2021-11-13 MED ORDER — DILTIAZEM HCL 60 MG PO TABS: 60.0000 mg | ORAL_TABLET | Freq: Three times a day (TID) | ORAL | Status: DC

## 2021-11-13 MED ORDER — METOPROLOL TARTRATE 25 MG PO TABS
25.0000 mg | ORAL_TABLET | Freq: Once | ORAL | Status: AC
  Administered 2021-11-13: 25 mg via ORAL
  Filled 2021-11-13: qty 1

## 2021-11-13 NOTE — Progress Notes (Signed)
Pt refused cpap QHS home unit. Pt said he is fine without it and would like to rest. Encouraged to let RN know if he change his mind.

## 2021-11-13 NOTE — Progress Notes (Signed)
Brief ANTICOAGULATION CONSULT NOTE  Pharmacy Consult for IV heparin Indication: atrial fibrillation  No Known Allergies  Patient Measurements: Height: 5' 6.93" (170 cm) Weight: 119 kg (262 lb 5.6 oz) IBW/kg (Calculated) : 65.94 Heparin Dosing Weight: 95 kg  Medications: Infusions:   sodium chloride 50 mL/hr at 11/13/21 1800   sodium chloride 250 mL (11/08/21 0653)   sodium chloride Stopped (11/09/21 1144)   sodium chloride 10 mL/hr at 11/13/21 1800   diltiazem (CARDIZEM) infusion Stopped (11/13/21 1301)   heparin 2,300 Units/hr (11/13/21 1800)   ondansetron (ZOFRAN) IV     piperacillin-tazobactam (ZOSYN)  IV 3.375 g (11/13/21 2015)     Assessment: 70 yoM with PMH DM2, HTN, CKD, admitted for intra-abdominal abscess; IR drain placed but otherwise conservative management per Surgery. Developed new Afib overnight 6/7, and Pharmacy consulted to dose IV heparin  Heparin level remains low but increased to 0.18 (previous rate increase) on 2300 units/hr RN reports no interruption in therapy or line issues, no bleeding or complications.    Goal of Therapy: Heparin level 0.3-0.7 units/ml Monitor platelets by anticoagulation protocol: Yes  Plan: Rebolus Heparin 3000 units IV x 1 Increase Heparin gtt to 2600 units/hr  Check heparin level 6 hrs after rate increase Daily CBC, daily heparin level once stable Monitor for signs of bleeding or thrombosis  Gretta Arab PharmD, BCPS Clinical Pharmacist [facility] main pharmacy [phone] 11/13/2021 8:32 PM

## 2021-11-13 NOTE — Progress Notes (Signed)
PROGRESS NOTE    Robert Larson  FYB:[id number] DOB: April 26, 1956 DOA: 11/07/2021 PCP: Jeanie Sewer, NP   Brief Narrative:  66 year old with history of DM2, HTN, CKD admitted to the hospital for severe sepsis due to intra-abdominal abscess.  CT abdomen pelvis showed multiple loculated fluid collection in the lower abdomen with a large cavity in the pelvis measuring 11.5 X6X 11.7 cm.  Due to septic shock he was transferred to the ICU on 6/5.  Patient was seen by IR and drain was placed.  Hospital course was also complicated by acute kidney injury, hypoxia and atrial fibrillation with RVR.   Assessment & Plan:  Principal Problem:   Intra-abdominal abscesses s/p perc drainage 11/08/2021 Active Problems:   Essential hypertension   Type 2 diabetes mellitus with morbid obesity (HCC)   Diverticulosis   Hyponatremia   Acute renal failure superimposed on chronic kidney disease (HCC)   Sleep apnea   Osteoarthritis   CKD (chronic kidney disease) stage 3, GFR 30-59 ml/min (HCC)   Adrenal nodule (HCC)   History of adenomatous polyp of colon   Spinal stenosis of lumbar region   Renal cyst   Cholelithiasis   Coffee ground emesis   Septic shock (HCC)   Tobacco abuse    Septic shock secondary to pulm intra-abdominal abscess - Shock physiology has improved with.  Status post drain placed by IR. - General surgery following.  Continue medical management but may require surgery - Antibiotics-Zosyn - NG tube removed.  On clear liquid diet - Repeat CT 6/9 shows persistent abscess with resolution of collection where drain is in place.  IR is following.  Acute hypoxic respiratory distress History of obstructive sleep apnea - Combination of pulmonary edema, possible underlying infection - Nocturnal CPAP.  Supplemental oxygen as needed  Atrial fibrillation with RVR, improved - No prior history.  Echocardiogram within normal limits.  Seen by cardiology team - TSH- Normal - Cardizem drip.   Increase metoprolol to 50 mg twice daily and attempt to wean off Cardizem drip - Currently on heparin drip  Acute kidney injury on CKD stage IIIa, resolved - Suspect ATN.  Baseline creatinine 1.6.  Creatinine peaked at 3.7.  Today 1.62 - Gentle fluids. Monitor vol status.   Hyponatremia with elevated chloride - Likely from fluid effusion.  1/2 NS  Transaminitis - Suspect from septic shock.  Appears to be slowly improving  Acute anemia - PPI.  Hemoglobin now stable.  Hemoglobin stable at 12.9 - NG tube removed.  Essential hypertension - Holding medication due to septic shock  Hyperglycemia - Sliding scale and Accu-Cheks      DVT prophylaxis: Heparin drip Code Status: Full code Family Communication: Daughter at bedside  Status is: Inpatient Remains inpatient appropriate because: Multiple ongoing issues, not stable for dc. Will be here for atleast 3-5 days     Subjective: Feels a lot better.  Had couple of small bowel movements in last 24 hours.  Still remains on Cardizem drip.  Examination: Constitutional: Not in acute distress Respiratory: some bibasilar rhonchi Cardiovascular: Normal sinus rhythm, no rubs Abdomen: Nontender nondistended good bowel sounds Musculoskeletal: No edema noted Skin: No rashes seen Neurologic: CN 2-12 grossly intact.  And nonfocal Psychiatric: Normal judgment and insight. Alert and oriented x 3. Normal mood. Drain in place.   Objective: Vitals:   11/13/21 0400 11/13/21 0500 11/13/21 0600 11/13/21 0700  BP: 133/75 130/73 (!) 144/78 132/83  Pulse: 82 84 73 89  Resp: (!) '29 16 17 '$ 18  Temp: (!) 97.3 F (36.3 C)     TempSrc: Axillary     SpO2: 95% 94% 96% 94%  Weight:      Height:        Intake/Output Summary (Last 24 hours) at 11/13/2021 0722 Last data filed at 11/13/2021 9767 Gross per 24 hour  Intake 1198.48 ml  Output 3700 ml  Net -2501.52 ml   Filed Weights   11/09/21 0000 11/11/21 0000 11/13/21 0000  Weight: 124.3 kg 124  kg 119 kg     Data Reviewed:   CBC: Recent Labs  Lab 11/09/21 0110 11/09/21 0400 11/09/21 2150 11/10/21 0250 11/11/21 0704 11/12/21 0407 11/13/21 0404  WBC 22.8*   < > 16.3* 15.4* 14.1* 14.6* 16.7*  NEUTROABS 20.8*  --   --   --   --   --   --   HGB 14.6   < > 12.7* 11.7* 13.0 12.9* 12.7*  HCT 44.3   < > 37.7* 35.0* 39.6 40.4 40.0  MCV 96.5   < > 94.3 96.7 98.5 99.0 99.3  PLT 222   < > 187 168 172 177 181   < > = values in this interval not displayed.   Basic Metabolic Panel: Recent Labs  Lab 11/10/21 0250 11/10/21 1205 11/11/21 0000 11/11/21 0704 11/12/21 0407 11/13/21 0404  NA 141  --  146* 146* 149* 148*  K 3.5  --  3.6 4.0 3.7 3.7  CL 109  --  113* 112* 116* 117*  CO2 22  --  '24 26 25 26  '$ GLUCOSE 132*  --  112* 111* 124* 112*  BUN 90*  --  79* 73* 56* 49*  CREATININE 2.58*  --  2.10* 1.98* 1.54* 1.62*  CALCIUM 8.2*  --  8.3* 8.6* 8.4* 8.1*  MG  --  3.0* 2.9*  --  2.3 1.9  PHOS  --   --   --   --  2.8  --    GFR: Estimated Creatinine Clearance: 56.8 mL/min (A) (by C-G formula based on SCr of 1.62 mg/dL (H)). Liver Function Tests: Recent Labs  Lab 11/08/21 0501 11/09/21 0110 11/09/21 0400 11/11/21 0704 11/13/21 0404  AST 70* 99* 102* 88* 68*  ALT 57* 76* 76* 57* 48*  ALKPHOS 74 42 40 52 45  BILITOT 1.5* 1.5* 1.2 1.0 1.1  PROT 5.9* 6.2* 5.9* 6.0* 6.1*  ALBUMIN 2.2* 2.2* 2.1* 1.9* 1.9*   Recent Labs  Lab 11/07/21 1029  LIPASE 40   No results for input(s): "AMMONIA" in the last 168 hours. Coagulation Profile: Recent Labs  Lab 11/08/21 0822 11/09/21 0616 11/12/21 1327  INR 1.6* 2.1* 1.3*   Cardiac Enzymes: No results for input(s): "CKTOTAL", "CKMB", "CKMBINDEX", "TROPONINI" in the last 168 hours. BNP (last 3 results) No results for input(s): "PROBNP" in the last 8760 hours. HbA1C: No results for input(s): "HGBA1C" in the last 72 hours. CBG: Recent Labs  Lab 11/12/21 1156 11/12/21 1618 11/12/21 2000 11/12/21 2343 11/13/21 0347   GLUCAP 117* 108* 115* 111* 111*   Lipid Profile: No results for input(s): "CHOL", "HDL", "LDLCALC", "TRIG", "CHOLHDL", "LDLDIRECT" in the last 72 hours. Thyroid Function Tests: Recent Labs    11/12/21 0500  TSH 3.480   Anemia Panel: No results for input(s): "VITAMINB12", "FOLATE", "FERRITIN", "TIBC", "IRON", "RETICCTPCT" in the last 72 hours. Sepsis Labs: Recent Labs  Lab 11/08/21 0501 11/08/21 2345 11/09/21 0246 11/09/21 0730  LATICACIDVEN 5.2* 5.0* 4.9* 3.5*    Recent Results (from the past 240  hour(s))  Culture, blood (routine x 2)     Status: None   Collection Time: 11/07/21  3:10 PM   Specimen: BLOOD RIGHT FOREARM  Result Value Ref Range Status   Specimen Description   Final    BLOOD RIGHT FOREARM Performed at [facility], [address] [place], [place] [address]    Special Requests   Final    BOTTLES DRAWN AEROBIC AND ANAEROBIC Blood Culture adequate volume Performed at [facility], [address] [place], [place] [address]    Culture   Final    NO GROWTH 5 DAYS Performed at [facility], [place] [address] [place], [place] [address]    Report Status 11/12/2021 FINAL  Final  Culture, blood (routine x 2)     Status: None   Collection Time: 11/07/21  3:12 PM   Specimen: BLOOD LEFT FOREARM  Result Value Ref Range Status   Specimen Description   Final    BLOOD LEFT FOREARM Performed at [facility], [address] [place], [place] [address]    Special Requests   Final    BOTTLES DRAWN AEROBIC AND ANAEROBIC Blood Culture adequate volume Performed at [facility], [address] [place], [place] [address]    Culture   Final    NO GROWTH 5 DAYS Performed at [facility], [place] [address], [place], [place] [address]    Report Status 11/12/2021 FINAL  Final  Surgical PCR screen     Status: None   Collection Time: 11/08/21  2:06 AM   Specimen: Nasal Mucosa; Nasal Swab   Result Value Ref Range Status   MRSA, PCR NEGATIVE NEGATIVE Final   Staphylococcus aureus NEGATIVE NEGATIVE Final    Comment: (NOTE) The Xpert SA Assay (FDA approved for NASAL specimens in patients 82 years of age and older), is one component of a comprehensive surveillance program. It is not intended to diagnose infection nor to guide or monitor treatment. Performed at [facility], [place] [address], [place], [place] [address]   Aerobic/Anaerobic Culture w Gram Stain (surgical/deep wound)     Status: None   Collection Time: 11/08/21  1:25 PM   Specimen: Abscess  Result Value Ref Range Status   Specimen Description   Final    ABSCESS DEEP PELVIC DRAIN Performed at [place] [address], [place], [place] [address]    Special Requests   Final    NONE Performed at [facility], [place] [address], [place], [place] [address]    Gram Stain   Final    RARE WBC PRESENT,BOTH PMN AND MONONUCLEAR ABUNDANT GRAM POSITIVE COCCI IN PAIRS MODERATE GRAM NEGATIVE RODS RARE GRAM POSITIVE RODS Performed at [facility], [place] [address], [place],  [address]    Culture   Final    MODERATE ESCHERICHIA COLI MIXED ANAEROBIC FLORA PRESENT.  CALL LAB IF FURTHER IID REQUIRED.    Report Status 11/11/2021 FINAL  Final   Organism ID, Bacteria ESCHERICHIA COLI  Final      Susceptibility   Escherichia coli - MIC*    AMPICILLIN 4 SENSITIVE Sensitive     CEFAZOLIN <=4 SENSITIVE Sensitive     CEFEPIME <=0.12 SENSITIVE Sensitive     CEFTAZIDIME <=1 SENSITIVE Sensitive     CEFTRIAXONE <=0.25 SENSITIVE Sensitive     CIPROFLOXACIN <=0.25 SENSITIVE Sensitive     GENTAMICIN <=1 SENSITIVE Sensitive     IMIPENEM <=0.25 SENSITIVE Sensitive  TRIMETH/SULFA <=20 SENSITIVE Sensitive     AMPICILLIN/SULBACTAM <=2 SENSITIVE Sensitive     PIP/TAZO <=4 SENSITIVE Sensitive     * MODERATE ESCHERICHIA COLI         Radiology Studies: CT  ABDOMEN PELVIS WO CONTRAST  Result Date: 11/12/2021 CLINICAL DATA:  Inpatient. Septic shock. Multiple intra-abdominal abscesses, possible perforated diverticulitis. Percutaneous CT-guided transgluteal pelvic drainage catheter placed 4 days prior. EXAM: CT ABDOMEN AND PELVIS WITHOUT CONTRAST TECHNIQUE: Multidetector CT imaging of the abdomen and pelvis was performed following the standard protocol without IV contrast. RADIATION DOSE REDUCTION: This exam was performed according to the departmental dose-optimization program which includes automated exposure control, adjustment of the mA and/or kV according to patient size and/or use of iterative reconstruction technique. COMPARISON:  11/07/2021 CT abdomen/pelvis. FINDINGS: Lower chest: New complete left lower lobe atelectasis and increased moderate lingular atelectasis. Mild-to-moderate patchy ground-glass opacity throughout right middle lobe. Coronary atherosclerosis. Hepatobiliary: Diffuse hepatic steatosis. Diffusely irregular liver surface with relative hypertrophy of the lateral segment left liver lobe, compatible with cirrhosis. No liver masses. Normal gallbladder with no radiopaque cholelithiasis. No biliary ductal dilatation. Pancreas: Normal, with no mass or duct dilation. Spleen: Normal size. No mass. Adrenals/Urinary Tract: Left adrenal 2.5 cm nodule with density 29 HU, stable size. Right adrenal 1.5 cm nodule with density 21 HU, stable size. No hydronephrosis. No renal stones. Multiple simple bilateral renal cysts, largest 5.7 cm in anterior upper left kidney and subcentimeter hyperdense left renal cortical foci that are too small to characterize, unchanged, for which no follow-up is recommended. Small amount of gas in the non dependent bladder, which is mildly distended and otherwise normal. Stomach/Bowel: Enteric tube terminates in the body of the stomach. Stomach is nondistended and otherwise normal. Normal caliber small bowel with no small bowel wall  thickening. Appendix appears normal. Oral contrast transits to the rectum. Scattered diverticulosis throughout the colon, most prominent in the sigmoid colon. Mild wall thickening and pericolonic fat stranding in the proximal sigmoid colon, similar. Vascular/Lymphatic: Atherosclerotic nonaneurysmal abdominal aorta. No pathologically enlarged lymph nodes in the abdomen or pelvis. Reproductive: Mild prostatomegaly. Other: Right transgluteal percutaneous pigtail drain terminates between the rectum and bladder to the left of midline, with no residual fluid collection in this location. Gas containing 6.4 x 4.8 x 5.0 cm (volume = 80 cm^3) right lower quadrant collection (series 2/image 60), previously 7.0 x 3.5 x 3.9 cm (volume = 50 cm^3) using similar measurement technique, overall mildly increased. Irregular gas containing left lower quadrant mesenteric 8.0 x 5.7 cm collection (series 2/image 60), increased from 5.9 x 4.0 cm. Multiple new interloop fluid collections in the left greater than right small bowel mesentery, largest 5.8 x 3.5 cm in the left small bowel mesentery (series 2/image 45). Musculoskeletal: No aggressive appearing focal osseous lesions. Left total hip arthroplasty. Marked thoracolumbar spondylosis. IMPRESSION: 1. Multifocal intraperitoneal abscesses with mixed interval changes. 2. Right transgluteal percutaneous pigtail drain terminates between the rectum and bladder to the left of midline, with no residual fluid collection in this location. 3. Gas-containing right lower quadrant collection is mildly increased. Irregular gas-containing left lower quadrant mesenteric collection, increased. Multiple new interloop fluid collections in the small bowel mesentery. 4. New complete left lower lobe atelectasis and increased moderate lingular atelectasis. Mild-to-moderate patchy ground-glass opacity throughout the right middle lobe, nonspecific, probably inflammatory. 5. Cirrhosis. No liver masses. 6. Stable  bilateral adrenal nodules with indeterminate density. Follow-up CT abdomen without and with IV contrast suggested in 12 months. 7. Small amount  of gas in the non dependent bladder, nonspecific, correlate for history of recent bladder instrumentation. 8. Aortic Atherosclerosis (ICD10-I70.0). Electronically Signed   By: Ilona Sorrel M.D.   On: 11/12/2021 11:50   DG Abd 1 View  Result Date: 11/11/2021 CLINICAL DATA:  Nasogastric tube placement EXAM: ABDOMEN - 1 VIEW COMPARISON:  11/09/2021 FINDINGS: A feeding tube is present with tip in the stomach body oriented towards the antrum. Retrocardiac airspace opacity in the lung bases. A pigtail catheter projects over the pelvis. The top of a left total hip prosthesis is partially visualized. Lower thoracic and lumbar spondylosis noted. IMPRESSION: 1. Feeding tube tip: Stomach body, oriented towards the antrum. 2. Retrocardiac airspace opacity on the left. Electronically Signed   By: Van Clines M.D.   On: 11/11/2021 08:48        Scheduled Meds:  alteplase  2 mg Intracatheter Once   aspirin  150 mg Rectal Daily   chlorhexidine  15 mL Mouth Rinse BID   Chlorhexidine Gluconate Cloth  6 each Topical Daily   fentaNYL   Intravenous Q4H   insulin aspart  0-15 Units Subcutaneous TID WC   ipratropium-albuterol  3 mL Nebulization TID   lip balm  1 application  Topical BID   mouth rinse  15 mL Mouth Rinse q12n4p   metoprolol tartrate  25 mg Oral BID   pantoprazole (PROTONIX) IV  40 mg Intravenous Q12H   sodium chloride flush  10-40 mL Intracatheter Q12H   sodium chloride flush  3 mL Intravenous Q12H   Continuous Infusions:  sodium chloride 250 mL (11/08/21 0653)   sodium chloride Stopped (11/09/21 1144)   sodium chloride 10 mL/hr at 11/12/21 1800   diltiazem (CARDIZEM) infusion 15 mg/hr (11/13/21 0718)   heparin 1,650 Units/hr (11/13/21 0355)   ondansetron (ZOFRAN) IV     piperacillin-tazobactam (ZOSYN)  IV 3.375 g (11/13/21 0359)     LOS: 6  days   Time spent= 35 mins    Jaeleen Inzunza Arsenio Loader, MD [facility]  If 7PM-7AM, please contact night-coverage  11/13/2021, 7:22 AM

## 2021-11-13 NOTE — Progress Notes (Signed)
ANTICOAGULATION CONSULT NOTE  Pharmacy Consult for IV heparin Indication: atrial fibrillation  No Known Allergies  Patient Measurements: Height: 5' 6.93" (170 cm) Weight: 124 kg (273 lb 5.9 oz) IBW/kg (Calculated) : 65.94 Heparin Dosing Weight: 95 kg  Vital Signs: Temp: 98.3 F (36.8 C) (06/10 0000) Temp Source: Axillary (06/10 0000) BP: 135/82 (06/09 2248) Pulse Rate: 97 (06/09 2248)  Labs: Recent Labs    11/10/21 0250 11/11/21 0000 11/11/21 0230 11/11/21 0704 11/12/21 0407 11/12/21 1327 11/12/21 2324  HGB 11.7*  --   --  13.0 12.9*  --   --   HCT 35.0*  --   --  39.6 40.4  --   --   PLT 168  --   --  172 177  --   --   LABPROT  --   --   --   --   --  16.4*  --   INR  --   --   --   --   --  1.3*  --   HEPARINUNFRC  --   --   --   --   --   --  <0.10*  CREATININE 2.58* 2.10*  --  1.98* 1.54*  --   --   TROPONINIHS  --  116* 101*  --   --   --   --      Estimated Creatinine Clearance: 61.1 mL/min (A) (by C-G formula based on SCr of 1.54 mg/dL (H)).   Medical History: Past Medical History:  Diagnosis Date   Acute right-sided low back pain with bilateral sciatica 03/13/2017   Contusion of left hip and thigh 02/13/2017   Osteoarthritis    Protein in urine    Sleep apnea     Medications:  Facility-Administered Medications Prior to Admission  Medication Dose Route Frequency Provider Last Rate Last Admin   0.9 %  sodium chloride infusion  500 mL Intravenous Once Irene Shipper, MD       Medications Prior to Admission  Medication Sig Dispense Refill Last Dose   FARXIGA 10 MG TABS tablet Take 10 mg by mouth daily.   11/06/2021   metoprolol succinate (TOPROL-XL) 25 MG 24 hr tablet Take 1 tablet (25 mg total) by mouth at bedtime. START with 1/2 pill for one week, then increase to 1 full pill. (Patient taking differently: Take 25 mg by mouth at bedtime.) 30 tablet 2 11/06/2021 at 2200   dapagliflozin propanediol (FARXIGA) 5 MG TABS tablet Take 1 tablet (5 mg total) by  mouth daily. (Patient not taking: Reported on 11/07/2021) 90 tablet 1 Not Taking   Dulaglutide (TRULICITY) 1.5 LK/9.1PH SOPN Inject 1.5 mg into the skin once a week. Start after you run out of the 0.'75mg'$  dose x 2 each week. (Patient not taking: Reported on 11/07/2021) 0.5 mL 2 Not Taking   metFORMIN (GLUCOPHAGE-XR) 500 MG 24 hr tablet TAKE 1 TABLET BY MOUTH EVERY DAY WITH BREAKFAST (Patient not taking: Reported on 11/07/2021) 90 tablet 1 Not Taking   Omega-3 Fatty Acids (FISH OIL PO) Take 2 capsules by mouth daily. (Patient not taking: Reported on 11/07/2021)   Not Taking   telmisartan-hydrochlorothiazide (MICARDIS HCT) 80-25 MG tablet Take 1 tablet by mouth daily. (Patient not taking: Reported on 11/07/2021)   Not Taking   varenicline (CHANTIX) 0.5 MG tablet INCREASE TO 2 TABS IN AM, 1 TAB IN PM FOR 1-2 WEEKS, THEN 2 TABS AM AND 2 TABS PM. (Patient not taking: Reported on 11/07/2021) 90 tablet  1 Not Taking   Scheduled:   alteplase  2 mg Intracatheter Once   aspirin  150 mg Rectal Daily   chlorhexidine  15 mL Mouth Rinse BID   Chlorhexidine Gluconate Cloth  6 each Topical Daily   fentaNYL   Intravenous Q4H   heparin  3,000 Units Intravenous Once   insulin aspart  0-15 Units Subcutaneous TID WC   ipratropium-albuterol  3 mL Nebulization TID   lip balm  1 application  Topical BID   mouth rinse  15 mL Mouth Rinse q12n4p   metoprolol tartrate  25 mg Oral BID   pantoprazole (PROTONIX) IV  40 mg Intravenous Q12H   sodium chloride flush  10-40 mL Intracatheter Q12H   sodium chloride flush  3 mL Intravenous Q12H   PRN: Place/Maintain arterial line **AND** sodium chloride, sodium chloride, acetaminophen **OR** [DISCONTINUED] acetaminophen, alum & mag hydroxide-simeth, dextromethorphan-guaiFENesin, diphenhydrAMINE, hydrALAZINE, ipratropium-albuterol, magic mouthwash, menthol-cetylpyridinium, metoprolol tartrate, naloxone **AND** sodium chloride flush, ondansetron (ZOFRAN) IV **OR** ondansetron (ZOFRAN) IV, phenol,  prochlorperazine, senna-docusate, simethicone, sodium chloride flush, traZODone  Assessment: 42 yoM with PMH DM2, HTN, CKD, admitted for intra-abdominal abscess; IR drain placed but otherwise conservative management per Surgery. Developed new Afib overnight 6/7, and Pharmacy consulted to dose IV heparin  Baseline INR minimally elevated, trending down after bumping earlier this admission d/t sepsis Prior anticoagulation: none  Significant events:  Today, 11/13/2021: Heparin level < 0.1 (subtherapeutic) with heparin gtt @ 1350 units/hr RN reports no interruption in therapy or line issues No complications of therapy noted  Goal of Therapy: Heparin level 0.3-0.7 units/ml Monitor platelets by anticoagulation protocol: Yes  Plan: Rebolus Heparin 3000 units IV x 1 Increase Heparin gtt to 1650 units/hr  Check heparin level 8 hrs after rate increase Daily CBC, daily heparin level once stable Monitor for signs of bleeding or thrombosis  Leone Haven, PharmD 11/13/2021, 12:06 AM

## 2021-11-13 NOTE — Progress Notes (Signed)
ANTICOAGULATION CONSULT NOTE  Pharmacy Consult for IV heparin Indication: atrial fibrillation  No Known Allergies  Patient Measurements: Height: 5' 6.93" (170 cm) Weight: 119 kg (262 lb 5.6 oz) IBW/kg (Calculated) : 65.94 Heparin Dosing Weight: 95 kg  Vital Signs: Temp: 98.2 F (36.8 C) (06/10 0744) Temp Source: Axillary (06/10 0744) BP: 137/86 (06/10 0818) Pulse Rate: 91 (06/10 0858)  Labs: Recent Labs    11/11/21 0000 11/11/21 0000 11/11/21 0230 11/11/21 0704 11/12/21 0407 11/12/21 1327 11/12/21 2324 11/13/21 0404 11/13/21 0841  HGB  --    < >  --  13.0 12.9*  --   --  12.7*  --   HCT  --   --   --  39.6 40.4  --   --  40.0  --   PLT  --   --   --  172 177  --   --  181  --   LABPROT  --   --   --   --   --  16.4*  --   --   --   INR  --   --   --   --   --  1.3*  --   --   --   HEPARINUNFRC  --   --   --   --   --   --  <0.10*  --  <0.10*  CREATININE 2.10*  --   --  1.98* 1.54*  --   --  1.62*  --   TROPONINIHS 116*  --  101*  --   --   --   --   --   --    < > = values in this interval not displayed.     Estimated Creatinine Clearance: 56.8 mL/min (A) (by C-G formula based on SCr of 1.62 mg/dL (H)).  Medications:  Facility-Administered Medications Prior to Admission  Medication Dose Route Frequency Provider Last Rate Last Admin   0.9 %  sodium chloride infusion  500 mL Intravenous Once Irene Shipper, MD       Medications Prior to Admission  Medication Sig Dispense Refill Last Dose   FARXIGA 10 MG TABS tablet Take 10 mg by mouth daily.   11/06/2021   metoprolol succinate (TOPROL-XL) 25 MG 24 hr tablet Take 1 tablet (25 mg total) by mouth at bedtime. START with 1/2 pill for one week, then increase to 1 full pill. (Patient taking differently: Take 25 mg by mouth at bedtime.) 30 tablet 2 11/06/2021 at 2200   dapagliflozin propanediol (FARXIGA) 5 MG TABS tablet Take 1 tablet (5 mg total) by mouth daily. (Patient not taking: Reported on 11/07/2021) 90 tablet 1 Not  Taking   Dulaglutide (TRULICITY) 1.5 XK/4.8JE SOPN Inject 1.5 mg into the skin once a week. Start after you run out of the 0.'75mg'$  dose x 2 each week. (Patient not taking: Reported on 11/07/2021) 0.5 mL 2 Not Taking   metFORMIN (GLUCOPHAGE-XR) 500 MG 24 hr tablet TAKE 1 TABLET BY MOUTH EVERY DAY WITH BREAKFAST (Patient not taking: Reported on 11/07/2021) 90 tablet 1 Not Taking   Omega-3 Fatty Acids (FISH OIL PO) Take 2 capsules by mouth daily. (Patient not taking: Reported on 11/07/2021)   Not Taking   telmisartan-hydrochlorothiazide (MICARDIS HCT) 80-25 MG tablet Take 1 tablet by mouth daily. (Patient not taking: Reported on 11/07/2021)   Not Taking   varenicline (CHANTIX) 0.5 MG tablet INCREASE TO 2 TABS IN AM, 1 TAB IN PM FOR 1-2 WEEKS, THEN  2 TABS AM AND 2 TABS PM. (Patient not taking: Reported on 11/07/2021) 90 tablet 1 Not Taking   Scheduled:   alteplase  2 mg Intracatheter Once   aspirin EC  81 mg Oral Daily   chlorhexidine  15 mL Mouth Rinse BID   Chlorhexidine Gluconate Cloth  6 each Topical Daily   diltiazem  60 mg Oral Q8H   fentaNYL   Intravenous Q4H   insulin aspart  0-15 Units Subcutaneous TID WC   ipratropium-albuterol  3 mL Nebulization TID   lip balm  1 application  Topical BID   mouth rinse  15 mL Mouth Rinse q12n4p   metoprolol tartrate  25 mg Oral BID   pantoprazole (PROTONIX) IV  40 mg Intravenous Q12H   sodium chloride flush  10-40 mL Intracatheter Q12H   sodium chloride flush  3 mL Intravenous Q12H   PRN: Place/Maintain arterial line **AND** sodium chloride, sodium chloride, acetaminophen **OR** [DISCONTINUED] acetaminophen, alum & mag hydroxide-simeth, dextromethorphan-guaiFENesin, diphenhydrAMINE, hydrALAZINE, ipratropium-albuterol, magic mouthwash, menthol-cetylpyridinium, metoprolol tartrate, naloxone **AND** sodium chloride flush, ondansetron (ZOFRAN) IV **OR** ondansetron (ZOFRAN) IV, phenol, prochlorperazine, senna-docusate, simethicone, sodium chloride flush,  traZODone  Assessment: 57 yoM with PMH DM2, HTN, CKD, admitted for intra-abdominal abscess; IR drain placed but otherwise conservative management per Surgery. Developed new Afib overnight 6/7, and Pharmacy consulted to dose IV heparin  Baseline INR minimally elevated, trending down after bumping earlier this admission d/t sepsis Prior anticoagulation: none  Significant events:  Today, 11/13/2021: Heparin level remains undetectable despite rate increase to 1650 units/hr RN reports no interruption in therapy or line issues SCr elevated but improved since admission No complications of therapy noted  Goal of Therapy: Heparin level 0.3-0.7 units/ml Monitor platelets by anticoagulation protocol: Yes  Plan: Rebolus Heparin 3000 units IV x 1 Increase Heparin gtt to 2300 units/hr  Check heparin level 8 hrs after rate increase Daily CBC, daily heparin level once stable Monitor for signs of bleeding or thrombosis  Reuel Boom, PharmD, BCPS [phone] 11/13/2021, 11:03 AM

## 2021-11-13 NOTE — Progress Notes (Signed)
Assessment & Plan: Septic shock Multiple intraabdominal abscesses - ?likely diverticular - s/p IR drain placement 6/5 - drainage purulent, Cx with moderate GNR/mixed anaerobic flora - repeat CT with resolution of large abscess, persistent interloop fluid collections - WBC 16K, afebrile, continue IV abx   FEN: CLD VTE: SCDs ID: Zosyn 6/4>>   - per CCM -  A. Fib with RVR - on heparin gtts AKI - Cr 1.67 Acute respiratory failure with hypoxia - HFNC, per CCM OSA Morbid obesity - BMI 43.01        Armandina Gemma, MD [facility] [place] practice Office: [phone]        Chief Complaint: Sepsis, intraabdominal abscesses  Subjective: Patient up to chair, appears comfortable.  NG out.  Tolerating clear liquid diet.  Having BM's.  Objective: Vital signs in last 24 hours: Temp:  [97.3 F (36.3 C)-98.5 F (36.9 C)] 98.2 F (36.8 C) (06/10 0744) Pulse Rate:  [39-97] 89 (06/10 0700) Resp:  [16-29] 25 (06/10 0744) BP: (107-144)/(59-89) 132/83 (06/10 0700) SpO2:  [92 %-96 %] 93 % (06/10 0744) FiO2 (%):  [40 %] 40 % (06/10 0744) Weight:  [324 kg] 119 kg (06/10 0000) Last BM Date : 11/12/21  Intake/Output from previous day: 06/09 0701 - 06/10 0700 In: 1198.5 [I.V.:1100.2; IV Piggyback:98.3] Out: 3100 [Urine:3100] Intake/Output this shift: Total I/O In: -  Out: 600 [Urine:600]  Physical Exam: HEENT - sclerae clear, mucous membranes moist Neck - soft, left IJ Chest - clear bilaterally Cor - rate controlled Abdomen - softer, obese; minimal tenderness; BS present Neuro - alert & oriented, no focal deficits  Lab Results:  Recent Labs    11/12/21 0407 11/13/21 0404  WBC 14.6* 16.7*  HGB 12.9* 12.7*  HCT 40.4 40.0  PLT 177 181   BMET Recent Labs    11/12/21 0407 11/13/21 0404  NA 149* 148*  K 3.7 3.7  CL 116* 117*  CO2 25 26  GLUCOSE 124* 112*  BUN 56* 49*  CREATININE 1.54* 1.62*  CALCIUM 8.4* 8.1*   PT/INR Recent Labs     11/12/21 1327  LABPROT 16.4*  INR 1.3*   Comprehensive Metabolic Panel:    Component Value Date/Time   NA 148 (H) 11/13/2021 0404   NA 149 (H) 11/12/2021 0407   NA 133 (A) 06/24/2021 0000   K 3.7 11/13/2021 0404   K 3.7 11/12/2021 0407   CL 117 (H) 11/13/2021 0404   CL 116 (H) 11/12/2021 0407   CO2 26 11/13/2021 0404   CO2 25 11/12/2021 0407   BUN 49 (H) 11/13/2021 0404   BUN 56 (H) 11/12/2021 0407   BUN 18 06/24/2021 0000   CREATININE 1.62 (H) 11/13/2021 0404   CREATININE 1.54 (H) 11/12/2021 0407   GLUCOSE 112 (H) 11/13/2021 0404   GLUCOSE 124 (H) 11/12/2021 0407   CALCIUM 8.1 (L) 11/13/2021 0404   CALCIUM 8.4 (L) 11/12/2021 0407   AST 68 (H) 11/13/2021 0404   AST 88 (H) 11/11/2021 0704   ALT 48 (H) 11/13/2021 0404   ALT 57 (H) 11/11/2021 0704   ALKPHOS 45 11/13/2021 0404   ALKPHOS 52 11/11/2021 0704   BILITOT 1.1 11/13/2021 0404   BILITOT 1.0 11/11/2021 0704   PROT 6.1 (L) 11/13/2021 0404   PROT 6.0 (L) 11/11/2021 0704   ALBUMIN 1.9 (L) 11/13/2021 0404   ALBUMIN 1.9 (L) 11/11/2021 0704    Studies/Results: CT ABDOMEN PELVIS WO CONTRAST  Result Date: 11/12/2021 CLINICAL DATA:  Inpatient. Septic shock.  Multiple intra-abdominal abscesses, possible perforated diverticulitis. Percutaneous CT-guided transgluteal pelvic drainage catheter placed 4 days prior. EXAM: CT ABDOMEN AND PELVIS WITHOUT CONTRAST TECHNIQUE: Multidetector CT imaging of the abdomen and pelvis was performed following the standard protocol without IV contrast. RADIATION DOSE REDUCTION: This exam was performed according to the departmental dose-optimization program which includes automated exposure control, adjustment of the mA and/or kV according to patient size and/or use of iterative reconstruction technique. COMPARISON:  11/07/2021 CT abdomen/pelvis. FINDINGS: Lower chest: New complete left lower lobe atelectasis and increased moderate lingular atelectasis. Mild-to-moderate patchy ground-glass opacity  throughout right middle lobe. Coronary atherosclerosis. Hepatobiliary: Diffuse hepatic steatosis. Diffusely irregular liver surface with relative hypertrophy of the lateral segment left liver lobe, compatible with cirrhosis. No liver masses. Normal gallbladder with no radiopaque cholelithiasis. No biliary ductal dilatation. Pancreas: Normal, with no mass or duct dilation. Spleen: Normal size. No mass. Adrenals/Urinary Tract: Left adrenal 2.5 cm nodule with density 29 HU, stable size. Right adrenal 1.5 cm nodule with density 21 HU, stable size. No hydronephrosis. No renal stones. Multiple simple bilateral renal cysts, largest 5.7 cm in anterior upper left kidney and subcentimeter hyperdense left renal cortical foci that are too small to characterize, unchanged, for which no follow-up is recommended. Small amount of gas in the non dependent bladder, which is mildly distended and otherwise normal. Stomach/Bowel: Enteric tube terminates in the body of the stomach. Stomach is nondistended and otherwise normal. Normal caliber small bowel with no small bowel wall thickening. Appendix appears normal. Oral contrast transits to the rectum. Scattered diverticulosis throughout the colon, most prominent in the sigmoid colon. Mild wall thickening and pericolonic fat stranding in the proximal sigmoid colon, similar. Vascular/Lymphatic: Atherosclerotic nonaneurysmal abdominal aorta. No pathologically enlarged lymph nodes in the abdomen or pelvis. Reproductive: Mild prostatomegaly. Other: Right transgluteal percutaneous pigtail drain terminates between the rectum and bladder to the left of midline, with no residual fluid collection in this location. Gas containing 6.4 x 4.8 x 5.0 cm (volume = 80 cm^3) right lower quadrant collection (series 2/image 60), previously 7.0 x 3.5 x 3.9 cm (volume = 50 cm^3) using similar measurement technique, overall mildly increased. Irregular gas containing left lower quadrant mesenteric 8.0 x 5.7 cm  collection (series 2/image 60), increased from 5.9 x 4.0 cm. Multiple new interloop fluid collections in the left greater than right small bowel mesentery, largest 5.8 x 3.5 cm in the left small bowel mesentery (series 2/image 45). Musculoskeletal: No aggressive appearing focal osseous lesions. Left total hip arthroplasty. Marked thoracolumbar spondylosis. IMPRESSION: 1. Multifocal intraperitoneal abscesses with mixed interval changes. 2. Right transgluteal percutaneous pigtail drain terminates between the rectum and bladder to the left of midline, with no residual fluid collection in this location. 3. Gas-containing right lower quadrant collection is mildly increased. Irregular gas-containing left lower quadrant mesenteric collection, increased. Multiple new interloop fluid collections in the small bowel mesentery. 4. New complete left lower lobe atelectasis and increased moderate lingular atelectasis. Mild-to-moderate patchy ground-glass opacity throughout the right middle lobe, nonspecific, probably inflammatory. 5. Cirrhosis. No liver masses. 6. Stable bilateral adrenal nodules with indeterminate density. Follow-up CT abdomen without and with IV contrast suggested in 12 months. 7. Small amount of gas in the non dependent bladder, nonspecific, correlate for history of recent bladder instrumentation. 8. Aortic Atherosclerosis (ICD10-I70.0). Electronically Signed   By: Ilona Sorrel M.D.   On: 11/12/2021 11:50   DG Abd 1 View  Result Date: 11/11/2021 CLINICAL DATA:  Nasogastric tube placement EXAM: ABDOMEN - 1 VIEW  COMPARISON:  11/09/2021 FINDINGS: A feeding tube is present with tip in the stomach body oriented towards the antrum. Retrocardiac airspace opacity in the lung bases. A pigtail catheter projects over the pelvis. The top of a left total hip prosthesis is partially visualized. Lower thoracic and lumbar spondylosis noted. IMPRESSION: 1. Feeding tube tip: Stomach body, oriented towards the antrum. 2.  Retrocardiac airspace opacity on the left. Electronically Signed   By: Van Clines M.D.   On: 11/11/2021 08:48      Armandina Gemma 11/13/2021   Patient ID: Clydell Hakim, male   DOB: 02-Sep-1955, 66 y.o.   MRN: [id number]

## 2021-11-13 NOTE — Progress Notes (Signed)
Progress Note  Patient Name: Robert Larson Date of Encounter: 11/13/2021  [facility] Cardiologist: Janina Mayo, MD   Subjective   No acute events. Pain currently well managed. HR controlled, weaning off diltiazem drip. No chest pain or shortness of breath.  Inpatient Medications    Scheduled Meds:  alteplase  2 mg Intracatheter Once   chlorhexidine  15 mL Mouth Rinse BID   Chlorhexidine Gluconate Cloth  6 each Topical Daily   fentaNYL   Intravenous Q4H   insulin aspart  0-15 Units Subcutaneous TID WC   ipratropium-albuterol  3 mL Nebulization TID   lip balm  1 application  Topical BID   mouth rinse  15 mL Mouth Rinse q12n4p   metoprolol tartrate  50 mg Oral BID   pantoprazole (PROTONIX) IV  40 mg Intravenous Q12H   sodium chloride flush  10-40 mL Intracatheter Q12H   sodium chloride flush  3 mL Intravenous Q12H   Continuous Infusions:  sodium chloride 50 mL/hr at 11/13/21 1300   sodium chloride 250 mL (11/08/21 0653)   sodium chloride Stopped (11/09/21 1144)   sodium chloride 10 mL/hr at 11/13/21 1300   diltiazem (CARDIZEM) infusion 5 mg/hr (11/13/21 1300)   heparin 2,300 Units/hr (11/13/21 1300)   ondansetron (ZOFRAN) IV     piperacillin-tazobactam (ZOSYN)  IV 12.5 mL/hr at 11/13/21 1300   PRN Meds: Place/Maintain arterial line **AND** sodium chloride, sodium chloride, acetaminophen **OR** [DISCONTINUED] acetaminophen, alum & mag hydroxide-simeth, dextromethorphan-guaiFENesin, diphenhydrAMINE, hydrALAZINE, ipratropium-albuterol, magic mouthwash, menthol-cetylpyridinium, metoprolol tartrate, naloxone **AND** sodium chloride flush, ondansetron (ZOFRAN) IV **OR** ondansetron (ZOFRAN) IV, phenol, prochlorperazine, senna-docusate, simethicone, sodium chloride flush, traZODone   Vital Signs    Vitals:   11/13/21 1240 11/13/21 1250 11/13/21 1300 11/13/21 1307  BP:    133/83  Pulse: 72 76 88 85  Resp: (!) 21 (!) 31 (!) 23 (!) 24  Temp:      TempSrc:      SpO2:  96% 92% 95% 96%  Weight:      Height:        Intake/Output Summary (Last 24 hours) at 11/13/2021 1351 Last data filed at 11/13/2021 1300 Gross per 24 hour  Intake 1412.12 ml  Output 2700 ml  Net -1287.88 ml      11/13/2021   12:00 AM 11/11/2021   12:00 AM 11/09/2021   12:00 AM  Last 3 Weights  Weight (lbs) 262 lb 5.6 oz 273 lb 5.9 oz 274 lb 0.5 oz  Weight (kg) 119 kg 124 kg 124.3 kg      Telemetry    Afib, rate controlled - Personally Reviewed  ECG    No new since 6/7 - Personally Reviewed  Physical Exam   GEN: No acute distress.  [place] in place Neck: No JVD at 60 degrees Cardiac: irregularly irregular S1/S2, no murmurs, rubs, or gallops.  Respiratory: Clear to auscultation bilaterally anterior/lateral fields GI: Soft,  mildly tender, non-distended  MS: No edema; No deformity. Neuro:  Nonfocal  Psych: Normal affect   Labs    High Sensitivity Troponin:   Recent Labs  Lab 11/11/21 0000 11/11/21 0230  TROPONINIHS 116* 101*     Chemistry Recent Labs  Lab 11/09/21 0400 11/09/21 1030 11/11/21 0000 11/11/21 0704 11/12/21 0407 11/13/21 0404  NA 139   < > 146* 146* 149* 148*  K 3.5   < > 3.6 4.0 3.7 3.7  CL 102   < > 113* 112* 116* 117*  CO2 17*   < >  $'24 26 25 26  'p$ GLUCOSE 141*   < > 112* 111* 124* 112*  BUN 92*   < > 79* 73* 56* 49*  CREATININE 3.77*   < > 2.10* 1.98* 1.54* 1.62*  CALCIUM 8.2*   < > 8.3* 8.6* 8.4* 8.1*  MG  --    < > 2.9*  --  2.3 1.9  PROT 5.9*  --   --  6.0*  --  6.1*  ALBUMIN 2.1*  --   --  1.9*  --  1.9*  AST 102*  --   --  88*  --  68*  ALT 76*  --   --  57*  --  48*  ALKPHOS 40  --   --  52  --  45  BILITOT 1.2  --   --  1.0  --  1.1  GFRNONAA 17*   < > 35* 37* 50* 47*  ANIONGAP 20*   < > '9 8 8 5   '$ < > = values in this interval not displayed.    Lipids No results for input(s): "CHOL", "TRIG", "HDL", "LABVLDL", "LDLCALC", "CHOLHDL" in the last 168 hours.  Hematology Recent Labs  Lab 11/11/21 0704 11/12/21 0407 11/13/21 0404  WBC  14.1* 14.6* 16.7*  RBC 4.02* 4.08* 4.03*  HGB 13.0 12.9* 12.7*  HCT 39.6 40.4 40.0  MCV 98.5 99.0 99.3  MCH 32.3 31.6 31.5  MCHC 32.8 31.9 31.8  RDW 14.6 14.5 14.5  PLT 172 177 181   Thyroid  Recent Labs  Lab 11/12/21 0500  TSH 3.480    BNPNo results for input(s): "BNP", "PROBNP" in the last 168 hours.  DDimer No results for input(s): "DDIMER" in the last 168 hours.   Radiology    CT ABDOMEN PELVIS WO CONTRAST  Result Date: 11/12/2021 CLINICAL DATA:  Inpatient. Septic shock. Multiple intra-abdominal abscesses, possible perforated diverticulitis. Percutaneous CT-guided transgluteal pelvic drainage catheter placed 4 days prior. EXAM: CT ABDOMEN AND PELVIS WITHOUT CONTRAST TECHNIQUE: Multidetector CT imaging of the abdomen and pelvis was performed following the standard protocol without IV contrast. RADIATION DOSE REDUCTION: This exam was performed according to the departmental dose-optimization program which includes automated exposure control, adjustment of the mA and/or kV according to patient size and/or use of iterative reconstruction technique. COMPARISON:  11/07/2021 CT abdomen/pelvis. FINDINGS: Lower chest: New complete left lower lobe atelectasis and increased moderate lingular atelectasis. Mild-to-moderate patchy ground-glass opacity throughout right middle lobe. Coronary atherosclerosis. Hepatobiliary: Diffuse hepatic steatosis. Diffusely irregular liver surface with relative hypertrophy of the lateral segment left liver lobe, compatible with cirrhosis. No liver masses. Normal gallbladder with no radiopaque cholelithiasis. No biliary ductal dilatation. Pancreas: Normal, with no mass or duct dilation. Spleen: Normal size. No mass. Adrenals/Urinary Tract: Left adrenal 2.5 cm nodule with density 29 HU, stable size. Right adrenal 1.5 cm nodule with density 21 HU, stable size. No hydronephrosis. No renal stones. Multiple simple bilateral renal cysts, largest 5.7 cm in anterior upper left  kidney and subcentimeter hyperdense left renal cortical foci that are too small to characterize, unchanged, for which no follow-up is recommended. Small amount of gas in the non dependent bladder, which is mildly distended and otherwise normal. Stomach/Bowel: Enteric tube terminates in the body of the stomach. Stomach is nondistended and otherwise normal. Normal caliber small bowel with no small bowel wall thickening. Appendix appears normal. Oral contrast transits to the rectum. Scattered diverticulosis throughout the colon, most prominent in the sigmoid colon. Mild wall thickening and pericolonic fat stranding  in the proximal sigmoid colon, similar. Vascular/Lymphatic: Atherosclerotic nonaneurysmal abdominal aorta. No pathologically enlarged lymph nodes in the abdomen or pelvis. Reproductive: Mild prostatomegaly. Other: Right transgluteal percutaneous pigtail drain terminates between the rectum and bladder to the left of midline, with no residual fluid collection in this location. Gas containing 6.4 x 4.8 x 5.0 cm (volume = 80 cm^3) right lower quadrant collection (series 2/image 60), previously 7.0 x 3.5 x 3.9 cm (volume = 50 cm^3) using similar measurement technique, overall mildly increased. Irregular gas containing left lower quadrant mesenteric 8.0 x 5.7 cm collection (series 2/image 60), increased from 5.9 x 4.0 cm. Multiple new interloop fluid collections in the left greater than right small bowel mesentery, largest 5.8 x 3.5 cm in the left small bowel mesentery (series 2/image 45). Musculoskeletal: No aggressive appearing focal osseous lesions. Left total hip arthroplasty. Marked thoracolumbar spondylosis. IMPRESSION: 1. Multifocal intraperitoneal abscesses with mixed interval changes. 2. Right transgluteal percutaneous pigtail drain terminates between the rectum and bladder to the left of midline, with no residual fluid collection in this location. 3. Gas-containing right lower quadrant collection is  mildly increased. Irregular gas-containing left lower quadrant mesenteric collection, increased. Multiple new interloop fluid collections in the small bowel mesentery. 4. New complete left lower lobe atelectasis and increased moderate lingular atelectasis. Mild-to-moderate patchy ground-glass opacity throughout the right middle lobe, nonspecific, probably inflammatory. 5. Cirrhosis. No liver masses. 6. Stable bilateral adrenal nodules with indeterminate density. Follow-up CT abdomen without and with IV contrast suggested in 12 months. 7. Small amount of gas in the non dependent bladder, nonspecific, correlate for history of recent bladder instrumentation. 8. Aortic Atherosclerosis (ICD10-I70.0). Electronically Signed   By: Ilona Sorrel M.D.   On: 11/12/2021 11:50    Cardiac Studies   TTE 11/09/2021    1. Left ventricular ejection fraction, by estimation, is 60 to 65%. The  left ventricle has normal function. The left ventricle has no regional  wall motion abnormalities. There is mild concentric left ventricular  hypertrophy. Left ventricular diastolic  parameters were normal.   2. Right ventricular systolic function is normal. The right ventricular  size is mildly enlarged. Tricuspid regurgitation signal is inadequate for  assessing PA pressure.   3. The mitral valve is grossly normal. Trivial mitral valve  regurgitation. No evidence of mitral stenosis.   4. The aortic valve is tricuspid. There is mild calcification of the  aortic valve. Aortic valve regurgitation is not visualized. Aortic valve  sclerosis/calcification is present, without any evidence of aortic  stenosis.   5. There is mild dilatation of the ascending aorta, measuring 42 mm.   6. The inferior vena cava is dilated in size with >50% respiratory  variability, suggesting right atrial pressure of 8 mmHg.    Patient Profile     66 y.o. male with a hx of HTN, type 2 DM, CKD II, osteoarthritis,  obesity, tobacco abuse, who is being  seen for the evaluation of A fib RVR at the request of Dr Lamonte Sakai  Assessment & Plan    Atrial fibrillation with RVR, new diagnosis in the setting of septic shock -chadsvasc=2.  -on heparin drip, diltiazem drip as well as metoprolol tartrate 50 mg BID. Agree with attempting oral meds to wean diltiazem drip -aiming for rate control given acute illness -Echo with normal EF -keep K>4, Mg >2  Elevated troponin: flat, likely 2/2 RVR -not ACS, with start of heparin and likely plans for anticoagulation will stop aspirin  Septic shock, resolved, 2/2 intra-abdominal  abscesses and s/p IR drain placement -complicated by AKI and transaminitis, improving -antibiotics, drain per surgery/medicine teams  For questions or updates, please contact [facility] Please consult [email or web address] for contact info under        Signed, Buford Dresser, MD  11/13/2021, 1:51 PM

## 2021-11-14 DIAGNOSIS — I480 Paroxysmal atrial fibrillation: Secondary | ICD-10-CM | POA: Diagnosis not present

## 2021-11-14 DIAGNOSIS — K651 Peritoneal abscess: Secondary | ICD-10-CM | POA: Diagnosis not present

## 2021-11-14 LAB — GLUCOSE, CAPILLARY
Glucose-Capillary: 104 mg/dL — ABNORMAL HIGH (ref 70–99)
Glucose-Capillary: 105 mg/dL — ABNORMAL HIGH (ref 70–99)
Glucose-Capillary: 107 mg/dL — ABNORMAL HIGH (ref 70–99)
Glucose-Capillary: 109 mg/dL — ABNORMAL HIGH (ref 70–99)
Glucose-Capillary: 154 mg/dL — ABNORMAL HIGH (ref 70–99)
Glucose-Capillary: 94 mg/dL (ref 70–99)

## 2021-11-14 LAB — CBC
HCT: 41.2 % (ref 39.0–52.0)
Hemoglobin: 13.3 g/dL (ref 13.0–17.0)
MCH: 32 pg (ref 26.0–34.0)
MCHC: 32.3 g/dL (ref 30.0–36.0)
MCV: 99 fL (ref 80.0–100.0)
Platelets: 200 10*3/uL (ref 150–400)
RBC: 4.16 MIL/uL — ABNORMAL LOW (ref 4.22–5.81)
RDW: 14.5 % (ref 11.5–15.5)
WBC: 15.2 10*3/uL — ABNORMAL HIGH (ref 4.0–10.5)
nRBC: 0 % (ref 0.0–0.2)

## 2021-11-14 LAB — COMPREHENSIVE METABOLIC PANEL
ALT: 54 U/L — ABNORMAL HIGH (ref 0–44)
AST: 71 U/L — ABNORMAL HIGH (ref 15–41)
Albumin: 2.1 g/dL — ABNORMAL LOW (ref 3.5–5.0)
Alkaline Phosphatase: 48 U/L (ref 38–126)
Anion gap: 7 (ref 5–15)
BUN: 43 mg/dL — ABNORMAL HIGH (ref 8–23)
CO2: 24 mmol/L (ref 22–32)
Calcium: 8.1 mg/dL — ABNORMAL LOW (ref 8.9–10.3)
Chloride: 112 mmol/L — ABNORMAL HIGH (ref 98–111)
Creatinine, Ser: 1.51 mg/dL — ABNORMAL HIGH (ref 0.61–1.24)
GFR, Estimated: 51 mL/min — ABNORMAL LOW (ref >60)
Glucose, Bld: 112 mg/dL — ABNORMAL HIGH (ref 70–99)
Potassium: 3.6 mmol/L (ref 3.5–5.1)
Sodium: 143 mmol/L (ref 135–145)
Total Bilirubin: 1.1 mg/dL (ref 0.3–1.2)
Total Protein: 6.8 g/dL (ref 6.5–8.1)

## 2021-11-14 LAB — MAGNESIUM: Magnesium: 2.1 mg/dL (ref 1.7–2.4)

## 2021-11-14 LAB — HEPARIN LEVEL (UNFRACTIONATED)
Heparin Unfractionated: 0.48 IU/mL (ref 0.30–0.70)
Heparin Unfractionated: 0.35 IU/mL (ref 0.30–0.70)

## 2021-11-14 LAB — BRAIN NATRIURETIC PEPTIDE: B Natriuretic Peptide: 158.3 pg/mL — ABNORMAL HIGH (ref 0.0–100.0)

## 2021-11-14 MED ORDER — METOPROLOL SUCCINATE ER 100 MG PO TB24
100.0000 mg | ORAL_TABLET | Freq: Every day | ORAL | Status: DC
  Administered 2021-11-15 – 2021-11-18 (×3): 100 mg via ORAL
  Filled 2021-11-14 (×3): qty 1

## 2021-11-14 NOTE — Progress Notes (Addendum)
Assessment & Plan: Septic shock Multiple intraabdominal abscesses - ?diverticular etiology - s/p IR drain placement 6/5 - repeat CT 6/9 with resolution of large abscess, persistent interloop fluid collections - WBC 15K, afebrile, continue IV abx   FEN: CLD VTE: SCDs ID: [email or web address] 6/4>>   - per CCM and cardiology - A. Fib with RVR - on heparin gtts AKI - Cr 1.51 Acute respiratory failure with hypoxia - HFNC, per CCM OSA Morbid obesity - BMI 43.01        Armandina Gemma, MD [facility] [place] [facility] Office: [phone]        Chief Complaint: Intra-abdominal abscesses, sepsis  Subjective: Patient in bed, up to chair already this AM.  Tolerating clear liquid diet.  Denies pain.  Having BM's, passing flatus.  Objective: Vital signs in last 24 hours: Temp:  [98.3 F (36.8 C)-98.6 F (37 C)] 98.4 F (36.9 C) (06/11 0400) Pulse Rate:  [55-105] 60 (06/11 0700) Resp:  [18-31] 22 (06/11 0400) BP: (132-173)/(62-96) 146/63 (06/11 0700) SpO2:  [91 %-98 %] 91 % (06/11 0700) FiO2 (%):  [1 %-44 %] 1 % (06/11 0000) Last BM Date : 11/12/21  Intake/Output from previous day: 06/10 0701 - 06/11 0700 In: 1752.9 [I.V.:1604.2; IV Piggyback:148.7] Out: 2560 [Urine:2550; Drains:10] Intake/Output this shift: No intake/output data recorded.  Physical Exam: HEENT - sclerae clear, mucous membranes moist Neck - soft Cor - rate 60's Abdomen - soft, obese; BS present; non-tender Ext - no edema, non-tender Neuro - alert & oriented, no focal deficits  Lab Results:  Recent Labs    11/13/21 0404 11/14/21 0423  WBC 16.7* 15.2*  HGB 12.7* 13.3  HCT 40.0 41.2  PLT 181 200   BMET Recent Labs    11/13/21 0404 11/14/21 0423  NA 148* 143  K 3.7 3.6  CL 117* 112*  CO2 26 24  GLUCOSE 112* 112*  BUN 49* 43*  CREATININE 1.62* 1.51*  CALCIUM 8.1* 8.1*   PT/INR Recent Labs    11/12/21 1327  LABPROT 16.4*  INR 1.3*   Comprehensive Metabolic Panel:     Component Value Date/Time   NA 143 11/14/2021 0423   NA 148 (H) 11/13/2021 0404   NA 133 (A) 06/24/2021 0000   K 3.6 11/14/2021 0423   K 3.7 11/13/2021 0404   CL 112 (H) 11/14/2021 0423   CL 117 (H) 11/13/2021 0404   CO2 24 11/14/2021 0423   CO2 26 11/13/2021 0404   BUN 43 (H) 11/14/2021 0423   BUN 49 (H) 11/13/2021 0404   BUN 18 06/24/2021 0000   CREATININE 1.51 (H) 11/14/2021 0423   CREATININE 1.62 (H) 11/13/2021 0404   GLUCOSE 112 (H) 11/14/2021 0423   GLUCOSE 112 (H) 11/13/2021 0404   CALCIUM 8.1 (L) 11/14/2021 0423   CALCIUM 8.1 (L) 11/13/2021 0404   AST 71 (H) 11/14/2021 0423   AST 68 (H) 11/13/2021 0404   ALT 54 (H) 11/14/2021 0423   ALT 48 (H) 11/13/2021 0404   ALKPHOS 48 11/14/2021 0423   ALKPHOS 45 11/13/2021 0404   BILITOT 1.1 11/14/2021 0423   BILITOT 1.1 11/13/2021 0404   PROT 6.8 11/14/2021 0423   PROT 6.1 (L) 11/13/2021 0404   ALBUMIN 2.1 (L) 11/14/2021 0423   ALBUMIN 1.9 (L) 11/13/2021 0404    Studies/Results: CT ABDOMEN PELVIS WO CONTRAST  Result Date: 11/12/2021 CLINICAL DATA:  Inpatient. Septic shock. Multiple intra-abdominal abscesses, possible perforated diverticulitis. Percutaneous CT-guided transgluteal pelvic drainage catheter placed 4 days prior.  EXAM: CT ABDOMEN AND PELVIS WITHOUT CONTRAST TECHNIQUE: Multidetector CT imaging of the abdomen and pelvis was performed following the standard protocol without IV contrast. RADIATION DOSE REDUCTION: This exam was performed according to the departmental dose-optimization program which includes automated exposure control, adjustment of the mA and/or kV according to patient size and/or use of iterative reconstruction technique. COMPARISON:  11/07/2021 CT abdomen/pelvis. FINDINGS: Lower chest: New complete left lower lobe atelectasis and increased moderate lingular atelectasis. Mild-to-moderate patchy ground-glass opacity throughout right middle lobe. Coronary atherosclerosis. Hepatobiliary: Diffuse hepatic  steatosis. Diffusely irregular liver surface with relative hypertrophy of the lateral segment left liver lobe, compatible with cirrhosis. No liver masses. Normal gallbladder with no radiopaque cholelithiasis. No biliary ductal dilatation. Pancreas: Normal, with no mass or duct dilation. Spleen: Normal size. No mass. Adrenals/Urinary Tract: Left adrenal 2.5 cm nodule with density 29 HU, stable size. Right adrenal 1.5 cm nodule with density 21 HU, stable size. No hydronephrosis. No renal stones. Multiple simple bilateral renal cysts, largest 5.7 cm in anterior upper left kidney and subcentimeter hyperdense left renal cortical foci that are too small to characterize, unchanged, for which no follow-up is recommended. Small amount of gas in the non dependent bladder, which is mildly distended and otherwise normal. Stomach/Bowel: Enteric tube terminates in the body of the stomach. Stomach is nondistended and otherwise normal. Normal caliber small bowel with no small bowel wall thickening. Appendix appears normal. Oral contrast transits to the rectum. Scattered diverticulosis throughout the colon, most prominent in the sigmoid colon. Mild wall thickening and pericolonic fat stranding in the proximal sigmoid colon, similar. Vascular/Lymphatic: Atherosclerotic nonaneurysmal abdominal aorta. No pathologically enlarged lymph nodes in the abdomen or pelvis. Reproductive: Mild prostatomegaly. Other: Right transgluteal percutaneous pigtail drain terminates between the rectum and bladder to the left of midline, with no residual fluid collection in this location. Gas containing 6.4 x 4.8 x 5.0 cm (volume = 80 cm^3) right lower quadrant collection (series 2/image 60), previously 7.0 x 3.5 x 3.9 cm (volume = 50 cm^3) using similar measurement technique, overall mildly increased. Irregular gas containing left lower quadrant mesenteric 8.0 x 5.7 cm collection (series 2/image 60), increased from 5.9 x 4.0 cm. Multiple new interloop  fluid collections in the left greater than right small bowel mesentery, largest 5.8 x 3.5 cm in the left small bowel mesentery (series 2/image 45). Musculoskeletal: No aggressive appearing focal osseous lesions. Left total hip arthroplasty. Marked thoracolumbar spondylosis. IMPRESSION: 1. Multifocal intraperitoneal abscesses with mixed interval changes. 2. Right transgluteal percutaneous pigtail drain terminates between the rectum and bladder to the left of midline, with no residual fluid collection in this location. 3. Gas-containing right lower quadrant collection is mildly increased. Irregular gas-containing left lower quadrant mesenteric collection, increased. Multiple new interloop fluid collections in the small bowel mesentery. 4. New complete left lower lobe atelectasis and increased moderate lingular atelectasis. Mild-to-moderate patchy ground-glass opacity throughout the right middle lobe, nonspecific, probably inflammatory. 5. Cirrhosis. No liver masses. 6. Stable bilateral adrenal nodules with indeterminate density. Follow-up CT abdomen without and with IV contrast suggested in 12 months. 7. Small amount of gas in the non dependent bladder, nonspecific, correlate for history of recent bladder instrumentation. 8. Aortic Atherosclerosis (ICD10-I70.0). Electronically Signed   By: Ilona Sorrel M.D.   On: 11/12/2021 11:50      Armandina Gemma 11/14/2021   Patient ID: Robert Larson, male   DOB: 1955-07-03, 66 y.o.   MRN: [id number]

## 2021-11-14 NOTE — Progress Notes (Signed)
Occupational Therapy Treatment Patient Details Name: Robert Larson MRN: [id number] DOB: September 05, 1955 Today's Date: 11/14/2021   History of present illness 66 yo male admited with abd abscess s/p transgluteal drain, sepsis, GI bleed. Hx of DM, CKD, OA, LBP with sciatica.  On 11/10/21 pt with new onset A-Fib with RVR.   OT comments  Patient was noted to have increased functional activity tolerance during session on this date. Patient was noted to be able to maintain O2 saturations on RA with one noted drop to 87% on RA with deep breathing in standing to recover. Daughter was present and educated on bed rails for home. Patient would continue to benefit from skilled OT services at this time while admitted and after d/c to address noted deficits in order to improve overall safety and independence in ADLs.     Recommendations for follow up therapy are one component of a multi-disciplinary discharge planning process, led by the attending physician.  Recommendations may be updated based on patient status, additional functional criteria and insurance authorization.    Follow Up Recommendations  Home health OT    Assistance Recommended at Discharge Frequent or constant Supervision/Assistance  Patient can return home with the following  A lot of help with bathing/dressing/bathroom;A little help with walking and/or transfers   Equipment Recommendations  BSC/3in1    Recommendations for Other Services      Precautions / Restrictions Precautions Precautions: Fall Precaution Comments: drain R gluteal, NG tube, Aline....lots of lines Restrictions Weight Bearing Restrictions: No       Mobility Bed Mobility   Bed Mobility: Supine to Sit     Supine to sit: Min guard, HOB elevated     General bed mobility comments: Modified roll with HOB elevated and use of bed rail. patient reported " let me show you what i can do    Transfers                         Balance Overall balance  assessment: Needs assistance Sitting-balance support: Feet supported, No upper extremity supported Sitting balance-Leahy Scale: Good     Standing balance support: Single extremity supported Standing balance-Leahy Scale: Poor                             ADL either performed or assessed with clinical judgement   ADL Overall ADL's : Needs assistance/impaired                         Toilet Transfer: +2 for safety/equipment;Minimal assistance;Rolling walker (2 wheels);Cueing for sequencing;Cueing for safety   Toileting- Clothing Manipulation and Hygiene: Maximal assistance Toileting - Clothing Manipulation Details (indicate cue type and reason): standing at [facility] with daughter support. patient was educated on standing up as tall as possible to keep balance. eduaction on proper hand positioning for transfers as well.     Functional mobility during ADLs: Minimal assistance;Rolling walker (2 wheels);Cueing for safety;Cueing for sequencing;+2 for safety/equipment General ADL Comments: with increased assist for line management. daughter inquired about bed rails for home. patients daughter was educated on various locations to purchase one. she reported she would look into it    Extremity/Trunk Assessment              Vision       Perception     Praxis      Cognition Arousal/Alertness: Awake/alert Behavior During Therapy: [facility] for  tasks assessed/performed, Flat affect Overall Cognitive Status: Within Functional Limits for tasks assessed                                          Exercises      Shoulder Instructions       General Comments      Pertinent Vitals/ Pain       Pain Assessment Pain Assessment: Faces Faces Pain Scale: Hurts a little bit Pain Location: abdomen, gluteal area (drain site) Pain Descriptors / Indicators: Discomfort, Grimacing, Guarding Pain Intervention(s): Limited activity within patient's tolerance, Monitored  during session  Home Living                                          Prior Functioning/Environment              Frequency  Min 2X/week        Progress Toward Goals  OT Goals(current goals can now be found in the care plan section)  Progress towards OT goals: Progressing toward goals     Plan Discharge plan remains appropriate    Co-evaluation    PT/OT/SLP Co-Evaluation/Treatment: Yes Reason for Co-Treatment: For patient/therapist safety;To address functional/ADL transfers PT goals addressed during session: Mobility/safety with mobility OT goals addressed during session: ADL's and self-care      AM-PAC OT "6 Clicks" Daily Activity     Outcome Measure   Help from another person eating meals?: Total (NPO) Help from another person taking care of personal grooming?: A Little Help from another person toileting, which includes using toliet, bedpan, or urinal?: A Lot Help from another person bathing (including washing, rinsing, drying)?: A Lot Help from another person to put on and taking off regular upper body clothing?: A Little Help from another person to put on and taking off regular lower body clothing?: Total 6 Click Score: 12    End of Session Equipment Utilized During Treatment: Gait belt;Oxygen;Rolling walker (2 wheels)  OT Visit Diagnosis: Unsteadiness on feet (R26.81);Pain   Activity Tolerance Patient tolerated treatment well   Patient Left in bed;with nursing/sitter in room;with family/visitor present   Nurse Communication Mobility status        Time: 1345-1411 OT Time Calculation (min): 26 min  Charges: OT General Charges $OT Visit: 1 Visit OT Treatments $Self Care/Home Management : 8-22 mins  [name] OTR/L, MS Acute Rehabilitation Department Office# [phone] Pager# [phone]   Marcellina Millin 11/14/2021, 4:09 PM

## 2021-11-14 NOTE — Progress Notes (Signed)
Brief ANTICOAGULATION CONSULT NOTE  Pharmacy Consult for IV heparin Indication: atrial fibrillation  No Known Allergies  Patient Measurements: Height: 5' 6.93" (170 cm) Weight: 119 kg (262 lb 5.6 oz) IBW/kg (Calculated) : 65.94 Heparin Dosing Weight: 95 kg  Medications: Infusions:   sodium chloride 50 mL/hr at 11/13/21 1800   sodium chloride 250 mL (11/08/21 0653)   sodium chloride Stopped (11/09/21 1144)   sodium chloride 10 mL/hr at 11/13/21 1800   diltiazem (CARDIZEM) infusion Stopped (11/13/21 1301)   heparin 2,600 Units/hr (11/13/21 2204)   ondansetron (ZOFRAN) IV     piperacillin-tazobactam (ZOSYN)  IV Stopped (11/14/21 0025)     Assessment: 67 yoM with PMH DM2, HTN, CKD, admitted for intra-abdominal abscess; IR drain placed but otherwise conservative management per Surgery. Developed new Afib overnight 6/7, and Pharmacy consulted to dose IV heparin  Heparin level = 0.48 (therapeutic) with heparin gtt @ 2600 units/hr  Hgb wnl, PLTC wnl No complications of therapy noted  Goal of Therapy: Heparin level 0.3-0.7 units/ml Monitor platelets by anticoagulation protocol: Yes  Plan: Continue Heparin gtt @ 2600 units/hr  Recheck heparin level in 6 hrs to confirm therapeutic dose Daily CBC, daily heparin level once stable Monitor for signs of bleeding or thrombosis  Leone Haven, PharmD 11/14/2021 4:58 AM

## 2021-11-14 NOTE — Progress Notes (Signed)
Brief ANTICOAGULATION CONSULT NOTE  Pharmacy Consult for IV heparin Indication: atrial fibrillation  No Known Allergies  Patient Measurements: Height: 5' 6.93" (170 cm) Weight: 119 kg (262 lb 5.6 oz) IBW/kg (Calculated) : 65.94 Heparin Dosing Weight: 95 kg  Medications: Infusions:   sodium chloride 50 mL/hr at 11/14/21 0925   sodium chloride 250 mL (11/08/21 0653)   sodium chloride Stopped (11/09/21 1144)   sodium chloride 10 mL/hr at 11/14/21 0925   heparin 2,600 Units/hr (11/14/21 0925)   ondansetron (ZOFRAN) IV     piperacillin-tazobactam (ZOSYN)  IV Stopped (11/14/21 0919)     Assessment: 28 yoM with PMH DM2, HTN, CKD, admitted for intra-abdominal abscess; IR drain placed but otherwise conservative management per Surgery. Developed new Afib overnight 6/7, and Pharmacy consulted to dose IV heparin  Today, 11/14/2021: Heparin level remains therapeutic on 2600 units/hr  CBC stable WNL No complications of therapy noted  Goal of Therapy: Heparin level 0.3-0.7 units/ml Monitor platelets by anticoagulation protocol: Yes  Plan: Continue Heparin gtt at 2600 units/hr  Daily CBC and heparin level Monitor for signs of bleeding or thrombosis F/u plans for transition to [place], PharmD, BCPS [phone] 11/14/2021, 11:15 AM

## 2021-11-14 NOTE — Progress Notes (Signed)
PROGRESS NOTE    Robert Larson  XBJ:[id number] DOB: January 27, 1956 DOA: 11/07/2021 PCP: Robert Sewer, NP   Brief Narrative:  66 year old with history of DM2, HTN, CKD admitted to the hospital for severe sepsis due to intra-abdominal abscess.  CT abdomen pelvis showed multiple loculated fluid collection in the lower abdomen with a large cavity in the pelvis measuring 11.5 X6X 11.7 cm.  Due to septic shock he was transferred to the ICU on 6/5.  Patient was seen by IR and drain was placed.  Hospital course was also complicated by acute kidney injury, hypoxia and atrial fibrillation with RVR.  Repeat CT showed resolution of abscess or the drain is in place but does have other pockets of fluid collection.  Patient is also on Cardizem drip, slowly uptitrating beta-blocker and heparin drip.   Assessment & Plan:  Principal Problem:   Intra-abdominal abscesses s/p perc drainage 11/08/2021 Active Problems:   Essential hypertension   Type 2 diabetes mellitus with morbid obesity (HCC)   Diverticulosis   Hyponatremia   Acute renal failure superimposed on chronic kidney disease (HCC)   Sleep apnea   Osteoarthritis   CKD (chronic kidney disease) stage 3, GFR 30-59 ml/min (HCC)   Adrenal nodule (HCC)   History of adenomatous polyp of colon   Spinal stenosis of lumbar region   Renal cyst   Cholelithiasis   Coffee ground emesis   Septic shock (HCC)   Tobacco abuse    Septic shock secondary to pulm intra-abdominal abscess - Shock physiology has improved with.  Status post drain placed by IR. - General surgery following.  Continue medical management but may require surgery - Antibiotic-Zosyn - Liquid diet, advance as tolerated by general surgery - Repeat CT 6/9 shows persistent abscess with resolution of collection where drain is in place.  IR is following.  Acute hypoxic respiratory distress History of obstructive sleep apnea - Combination of pulmonary edema, possible underlying infection -  Nocturnal CPAP, often refusing this at night.  Supplemental oxygen as needed  Atrial fibrillation with RVR, improved - No prior history.  Echocardiogram within normal limits.  Seen by cardiology team - TSH- Normal - Cardizem drip.  Continue metoprolol 50 mg twice daily.  We can consolidate to 100 mg p.o. starting tomorrow - Currently on heparin drip  Acute kidney injury on CKD stage IIIa, resolved - Suspect ATN.  Baseline creatinine 1.6.  Creatinine peaked at 3.7.  Today 1.51 - Gentle fluids. Monitor vol status.   Hypernatremia with elevated chloride, improved - Likely from fluid effusion.  Half-normal saline.  Monitor volume status.  Transaminitis - Suspect from septic shock.  Appears to be slowly improving  Acute anemia - PPI.  Hemoglobin now stable.  Hemoglobin stable at 12.9 - NG tube removed.  Essential hypertension - Holding medication due to septic shock  Hyperglycemia - Sliding scale and Accu-Cheks    We will transfer him to progressive  DVT prophylaxis: Heparin drip Code Status: Full code Family Communication: Family at bedside  Status is: Inpatient Remains inpatient appropriate because: Multiple ongoing issues, not stable for dc. Will be here for atleast 3-5 days     Subjective: Sitting up in the chair.  Doing better.  Converted to normal sinus rhythm yesterday. Some diarrhea in last 24 hours  Examination: Constitutional: Not in acute distress Respiratory: Clear to auscultation bilaterally Cardiovascular: Normal sinus rhythm, no rubs Abdomen: Nontender nondistended good bowel sounds Musculoskeletal: No edema noted Skin: No rashes seen Neurologic: CN 2-12 grossly intact.  And nonfocal Psychiatric: Normal judgment and insight. Alert and oriented x 3. Normal mood. Drain in place.  Left IJ central line in place  Objective: Vitals:   11/14/21 0400 11/14/21 0500 11/14/21 0600 11/14/21 0700  BP: (!) 162/81 (!) 160/84 (!) 173/94 (!) 146/63  Pulse: 73 60  63 60  Resp: (!) 22     Temp: 98.4 F (36.9 C)     TempSrc: Oral     SpO2: 98% 94% 93% 91%  Weight:      Height:        Intake/Output Summary (Last 24 hours) at 11/14/2021 0728 Last data filed at 11/14/2021 0100 Gross per 24 hour  Intake 1752.89 ml  Output 1960 ml  Net -207.11 ml   Filed Weights   11/09/21 0000 11/11/21 0000 11/13/21 0000  Weight: 124.3 kg 124 kg 119 kg     Data Reviewed:   CBC: Recent Labs  Lab 11/09/21 0110 11/09/21 0400 11/10/21 0250 11/11/21 0704 11/12/21 0407 11/13/21 0404 11/14/21 0423  WBC 22.8*   < > 15.4* 14.1* 14.6* 16.7* 15.2*  NEUTROABS 20.8*  --   --   --   --   --   --   HGB 14.6   < > 11.7* 13.0 12.9* 12.7* 13.3  HCT 44.3   < > 35.0* 39.6 40.4 40.0 41.2  MCV 96.5   < > 96.7 98.5 99.0 99.3 99.0  PLT 222   < > 168 172 177 181 200   < > = values in this interval not displayed.   Basic Metabolic Panel: Recent Labs  Lab 11/10/21 1205 11/11/21 0000 11/11/21 0704 11/12/21 0407 11/13/21 0404 11/14/21 0423  NA  --  146* 146* 149* 148* 143  K  --  3.6 4.0 3.7 3.7 3.6  CL  --  113* 112* 116* 117* 112*  CO2  --  '24 26 25 26 24  '$ GLUCOSE  --  112* 111* 124* 112* 112*  BUN  --  79* 73* 56* 49* 43*  CREATININE  --  2.10* 1.98* 1.54* 1.62* 1.51*  CALCIUM  --  8.3* 8.6* 8.4* 8.1* 8.1*  MG 3.0* 2.9*  --  2.3 1.9 2.1  PHOS  --   --   --  2.8  --   --    GFR: Estimated Creatinine Clearance: 60.9 mL/min (A) (by C-G formula based on SCr of 1.51 mg/dL (H)). Liver Function Tests: Recent Labs  Lab 11/09/21 0110 11/09/21 0400 11/11/21 0704 11/13/21 0404 11/14/21 0423  AST 99* 102* 88* 68* 71*  ALT 76* 76* 57* 48* 54*  ALKPHOS 42 40 52 45 48  BILITOT 1.5* 1.2 1.0 1.1 1.1  PROT 6.2* 5.9* 6.0* 6.1* 6.8  ALBUMIN 2.2* 2.1* 1.9* 1.9* 2.1*   Recent Labs  Lab 11/07/21 1029  LIPASE 40   No results for input(s): "AMMONIA" in the last 168 hours. Coagulation Profile: Recent Labs  Lab 11/08/21 0822 11/09/21 0616 11/12/21 1327  INR 1.6*  2.1* 1.3*   Cardiac Enzymes: No results for input(s): "CKTOTAL", "CKMB", "CKMBINDEX", "TROPONINI" in the last 168 hours. BNP (last 3 results) No results for input(s): "PROBNP" in the last 8760 hours. HbA1C: No results for input(s): "HGBA1C" in the last 72 hours. CBG: Recent Labs  Lab 11/13/21 1113 11/13/21 1515 11/13/21 2013 11/13/21 2348 11/14/21 0343  GLUCAP 111* 113* 102* 120* 109*   Lipid Profile: No results for input(s): "CHOL", "HDL", "LDLCALC", "TRIG", "CHOLHDL", "LDLDIRECT" in the last 72 hours. Thyroid Function Tests:  Recent Labs    11/12/21 0500  TSH 3.480   Anemia Panel: No results for input(s): "VITAMINB12", "FOLATE", "FERRITIN", "TIBC", "IRON", "RETICCTPCT" in the last 72 hours. Sepsis Labs: Recent Labs  Lab 11/08/21 0501 11/08/21 2345 11/09/21 0246 11/09/21 0730  LATICACIDVEN 5.2* 5.0* 4.9* 3.5*    Recent Results (from the past 240 hour(s))  Culture, blood (routine x 2)     Status: None   Collection Time: 11/07/21  3:10 PM   Specimen: BLOOD RIGHT FOREARM  Result Value Ref Range Status   Specimen Description   Final    BLOOD RIGHT FOREARM Performed at [facility], [address], [place], [place] [address]    Special Requests   Final    BOTTLES DRAWN AEROBIC AND ANAEROBIC Blood Culture adequate volume Performed at [facility], [address], [place], [place] [address]    Culture   Final    NO GROWTH 5 DAYS Performed at [facility], [place] [address], [place], [place] [address]    Report Status 11/12/2021 FINAL  Final  Culture, blood (routine x 2)     Status: None   Collection Time: 11/07/21  3:12 PM   Specimen: BLOOD LEFT FOREARM  Result Value Ref Range Status   Specimen Description   Final    BLOOD LEFT FOREARM Performed at [facility], [address] [place], [place] [address]    Special Requests   Final    BOTTLES DRAWN AEROBIC AND ANAEROBIC Blood Culture adequate  volume Performed at [facility], [address] [place], [place] [address]    Culture   Final    NO GROWTH 5 DAYS Performed at [facility], [place] [address], [place], [place] [address]    Report Status 11/12/2021 FINAL  Final  Surgical PCR screen     Status: None   Collection Time: 11/08/21  2:06 AM   Specimen: Nasal Mucosa; Nasal Swab  Result Value Ref Range Status   MRSA, PCR NEGATIVE NEGATIVE Final   Staphylococcus aureus NEGATIVE NEGATIVE Final    Comment: (NOTE) The Xpert SA Assay (FDA approved for NASAL specimens in patients 63 years of age and older), is one component of a comprehensive surveillance program. It is not intended to diagnose infection nor to guide or monitor treatment. Performed at [facility], [place] [address] [place], [place] [address]   Aerobic/Anaerobic Culture w Gram Stain (surgical/deep wound)     Status: None   Collection Time: 11/08/21  1:25 PM   Specimen: Abscess  Result Value Ref Range Status   Specimen Description   Final    ABSCESS DEEP PELVIC DRAIN Performed at [facility] [address] [place], [place] [address]    Special Requests   Final    NONE Performed at [facility], [place] [address], [place], [place] [address]    Gram Stain   Final    RARE WBC PRESENT,BOTH PMN AND MONONUCLEAR ABUNDANT GRAM POSITIVE COCCI IN PAIRS MODERATE GRAM NEGATIVE RODS RARE GRAM POSITIVE RODS Performed at [facility], [place] [address], [place], [place] [address]    Culture   Final    MODERATE ESCHERICHIA COLI MIXED ANAEROBIC FLORA PRESENT.  CALL LAB IF FURTHER IID REQUIRED.    Report Status 11/11/2021 FINAL  Final   Organism ID, Bacteria ESCHERICHIA COLI  Final      Susceptibility   Escherichia coli - MIC*    AMPICILLIN 4 SENSITIVE  Sensitive     CEFAZOLIN <=4 SENSITIVE Sensitive     CEFEPIME <=0.12 SENSITIVE Sensitive     CEFTAZIDIME <=1 SENSITIVE Sensitive      CEFTRIAXONE <=0.25 SENSITIVE Sensitive     CIPROFLOXACIN <=0.25 SENSITIVE Sensitive     GENTAMICIN <=1 SENSITIVE Sensitive     IMIPENEM <=0.25 SENSITIVE Sensitive     TRIMETH/SULFA <=20 SENSITIVE Sensitive     AMPICILLIN/SULBACTAM <=2 SENSITIVE Sensitive     PIP/TAZO <=4 SENSITIVE Sensitive     * MODERATE ESCHERICHIA COLI         Radiology Studies: CT ABDOMEN PELVIS WO CONTRAST  Result Date: 11/12/2021 CLINICAL DATA:  Inpatient. Septic shock. Multiple intra-abdominal abscesses, possible perforated diverticulitis. Percutaneous CT-guided transgluteal pelvic drainage catheter placed 4 days prior. EXAM: CT ABDOMEN AND PELVIS WITHOUT CONTRAST TECHNIQUE: Multidetector CT imaging of the abdomen and pelvis was performed following the standard protocol without IV contrast. RADIATION DOSE REDUCTION: This exam was performed according to the departmental dose-optimization program which includes automated exposure control, adjustment of the mA and/or kV according to patient size and/or use of iterative reconstruction technique. COMPARISON:  11/07/2021 CT abdomen/pelvis. FINDINGS: Lower chest: New complete left lower lobe atelectasis and increased moderate lingular atelectasis. Mild-to-moderate patchy ground-glass opacity throughout right middle lobe. Coronary atherosclerosis. Hepatobiliary: Diffuse hepatic steatosis. Diffusely irregular liver surface with relative hypertrophy of the lateral segment left liver lobe, compatible with cirrhosis. No liver masses. Normal gallbladder with no radiopaque cholelithiasis. No biliary ductal dilatation. Pancreas: Normal, with no mass or duct dilation. Spleen: Normal size. No mass. Adrenals/Urinary Tract: Left adrenal 2.5 cm nodule with density 29 HU, stable size. Right adrenal 1.5 cm nodule with density 21 HU, stable size. No hydronephrosis. No renal stones. Multiple simple bilateral renal cysts, largest 5.7 cm in anterior upper left kidney and subcentimeter hyperdense left  renal cortical foci that are too small to characterize, unchanged, for which no follow-up is recommended. Small amount of gas in the non dependent bladder, which is mildly distended and otherwise normal. Stomach/Bowel: Enteric tube terminates in the body of the stomach. Stomach is nondistended and otherwise normal. Normal caliber small bowel with no small bowel wall thickening. Appendix appears normal. Oral contrast transits to the rectum. Scattered diverticulosis throughout the colon, most prominent in the sigmoid colon. Mild wall thickening and pericolonic fat stranding in the proximal sigmoid colon, similar. Vascular/Lymphatic: Atherosclerotic nonaneurysmal abdominal aorta. No pathologically enlarged lymph nodes in the abdomen or pelvis. Reproductive: Mild prostatomegaly. Other: Right transgluteal percutaneous pigtail drain terminates between the rectum and bladder to the left of midline, with no residual fluid collection in this location. Gas containing 6.4 x 4.8 x 5.0 cm (volume = 80 cm^3) right lower quadrant collection (series 2/image 60), previously 7.0 x 3.5 x 3.9 cm (volume = 50 cm^3) using similar measurement technique, overall mildly increased. Irregular gas containing left lower quadrant mesenteric 8.0 x 5.7 cm collection (series 2/image 60), increased from 5.9 x 4.0 cm. Multiple new interloop fluid collections in the left greater than right small bowel mesentery, largest 5.8 x 3.5 cm in the left small bowel mesentery (series 2/image 45). Musculoskeletal: No aggressive appearing focal osseous lesions. Left total hip arthroplasty. Marked thoracolumbar spondylosis. IMPRESSION: 1. Multifocal intraperitoneal abscesses with mixed interval changes. 2. Right transgluteal percutaneous pigtail drain terminates between the rectum and bladder to the left of midline, with no residual fluid collection in this location. 3. Gas-containing right lower quadrant collection is mildly increased. Irregular gas-containing  left lower quadrant mesenteric collection, increased. Multiple  new interloop fluid collections in the small bowel mesentery. 4. New complete left lower lobe atelectasis and increased moderate lingular atelectasis. Mild-to-moderate patchy ground-glass opacity throughout the right middle lobe, nonspecific, probably inflammatory. 5. Cirrhosis. No liver masses. 6. Stable bilateral adrenal nodules with indeterminate density. Follow-up CT abdomen without and with IV contrast suggested in 12 months. 7. Small amount of gas in the non dependent bladder, nonspecific, correlate for history of recent bladder instrumentation. 8. Aortic Atherosclerosis (ICD10-I70.0). Electronically Signed   By: Ilona Sorrel M.D.   On: 11/12/2021 11:50        Scheduled Meds:  alteplase  2 mg Intracatheter Once   chlorhexidine  15 mL Mouth Rinse BID   Chlorhexidine Gluconate Cloth  6 each Topical Daily   fentaNYL   Intravenous Q4H   insulin aspart  0-15 Units Subcutaneous TID WC   ipratropium-albuterol  3 mL Nebulization TID   lip balm  1 application  Topical BID   mouth rinse  15 mL Mouth Rinse q12n4p   metoprolol tartrate  50 mg Oral BID   pantoprazole (PROTONIX) IV  40 mg Intravenous Q12H   sodium chloride flush  10-40 mL Intracatheter Q12H   sodium chloride flush  3 mL Intravenous Q12H   Continuous Infusions:  sodium chloride 50 mL/hr at 11/14/21 0520   sodium chloride 250 mL (11/08/21 0653)   sodium chloride Stopped (11/09/21 1144)   sodium chloride 10 mL/hr at 11/13/21 1800   diltiazem (CARDIZEM) infusion Stopped (11/13/21 1301)   heparin 2,600 Units/hr (11/13/21 2204)   ondansetron (ZOFRAN) IV     piperacillin-tazobactam (ZOSYN)  IV 3.375 g (11/14/21 0519)     LOS: 7 days   Time spent= 35 mins    Sharde Gover Arsenio Loader, MD [facility]  If 7PM-7AM, please contact night-coverage  11/14/2021, 7:28 AM

## 2021-11-14 NOTE — Progress Notes (Signed)
Pt has CPAP from home but states that he isn't going to wear it tonight.

## 2021-11-14 NOTE — Progress Notes (Signed)
Progress Note  Patient Name: Robert Larson Date of Encounter: 11/14/2021  [facility] Cardiologist: Janina Mayo, MD   Subjective   Doing better today, more energy since last night. No chest pain or shortness of breath.  Inpatient Medications    Scheduled Meds:  alteplase  2 mg Intracatheter Once   chlorhexidine  15 mL Mouth Rinse BID   Chlorhexidine Gluconate Cloth  6 each Topical Daily   insulin aspart  0-15 Units Subcutaneous TID WC   ipratropium-albuterol  3 mL Nebulization TID   lip balm  1 application  Topical BID   mouth rinse  15 mL Mouth Rinse q12n4p   metoprolol tartrate  50 mg Oral BID   pantoprazole (PROTONIX) IV  40 mg Intravenous Q12H   sodium chloride flush  10-40 mL Intracatheter Q12H   sodium chloride flush  3 mL Intravenous Q12H   Continuous Infusions:  sodium chloride 50 mL/hr at 11/14/21 0925   sodium chloride 250 mL (11/08/21 0653)   sodium chloride Stopped (11/09/21 1144)   sodium chloride 10 mL/hr at 11/14/21 0925   heparin 2,600 Units/hr (11/14/21 0925)   ondansetron (ZOFRAN) IV     piperacillin-tazobactam (ZOSYN)  IV Stopped (11/14/21 0919)   PRN Meds: Place/Maintain arterial line **AND** sodium chloride, sodium chloride, acetaminophen **OR** [DISCONTINUED] acetaminophen, alum & mag hydroxide-simeth, dextromethorphan-guaiFENesin, diphenhydrAMINE, hydrALAZINE, ipratropium-albuterol, magic mouthwash, menthol-cetylpyridinium, metoprolol tartrate, ondansetron (ZOFRAN) IV **OR** ondansetron (ZOFRAN) IV, phenol, prochlorperazine, senna-docusate, simethicone, sodium chloride flush, traZODone   Vital Signs    Vitals:   11/14/21 0700 11/14/21 0730 11/14/21 0800 11/14/21 0939  BP: (!) 146/63  (!) 160/89   Pulse: 60  63   Resp:   19   Temp:  97.6 F (36.4 C)    TempSrc:  Oral    SpO2: 91%  93% 94%  Weight:      Height:        Intake/Output Summary (Last 24 hours) at 11/14/2021 1014 Last data filed at 11/14/2021 0925 Gross per 24 hour   Intake 2279.45 ml  Output 1960 ml  Net 319.45 ml      11/13/2021   12:00 AM 11/11/2021   12:00 AM 11/09/2021   12:00 AM  Last 3 Weights  Weight (lbs) 262 lb 5.6 oz 273 lb 5.9 oz 274 lb 0.5 oz  Weight (kg) 119 kg 124 kg 124.3 kg      Telemetry    Converted to sinus rhythm last evening - Personally Reviewed  ECG    No new since 6/7 - Personally Reviewed  Physical Exam   GEN: Well nourished, well developed in no acute distress NECK: No JVD CARDIAC: regular rhythm, normal S1 and S2, no rubs or gallops. No murmur. VASCULAR: Radial pulses 2+ bilaterally.  RESPIRATORY:  Clear to auscultation without rales, wheezing or rhonchi  ABDOMEN: Soft, non-tender, non-distended MUSCULOSKELETAL:  Moves all 4 limbs independently SKIN: Warm and dry, no edema NEUROLOGIC:  No focal neuro deficits noted. PSYCHIATRIC:  Normal affect    Labs    High Sensitivity Troponin:   Recent Labs  Lab 11/11/21 0000 11/11/21 0230  TROPONINIHS 116* 101*     Chemistry Recent Labs  Lab 11/11/21 0704 11/12/21 0407 11/13/21 0404 11/14/21 0423  NA 146* 149* 148* 143  K 4.0 3.7 3.7 3.6  CL 112* 116* 117* 112*  CO2 '26 25 26 24  '$ GLUCOSE 111* 124* 112* 112*  BUN 73* 56* 49* 43*  CREATININE 1.98* 1.54* 1.62* 1.51*  CALCIUM 8.6* 8.4*  8.1* 8.1*  MG  --  2.3 1.9 2.1  PROT 6.0*  --  6.1* 6.8  ALBUMIN 1.9*  --  1.9* 2.1*  AST 88*  --  68* 71*  ALT 57*  --  48* 54*  ALKPHOS 52  --  45 48  BILITOT 1.0  --  1.1 1.1  GFRNONAA 37* 50* 47* 51*  ANIONGAP '8 8 5 7    '$ Lipids No results for input(s): "CHOL", "TRIG", "HDL", "LABVLDL", "LDLCALC", "CHOLHDL" in the last 168 hours.  Hematology Recent Labs  Lab 11/12/21 0407 11/13/21 0404 11/14/21 0423  WBC 14.6* 16.7* 15.2*  RBC 4.08* 4.03* 4.16*  HGB 12.9* 12.7* 13.3  HCT 40.4 40.0 41.2  MCV 99.0 99.3 99.0  MCH 31.6 31.5 32.0  MCHC 31.9 31.8 32.3  RDW 14.5 14.5 14.5  PLT 177 181 200   Thyroid  Recent Labs  Lab 11/12/21 0500  TSH 3.480    BNPNo  results for input(s): "BNP", "PROBNP" in the last 168 hours.  DDimer No results for input(s): "DDIMER" in the last 168 hours.   Radiology    CT ABDOMEN PELVIS WO CONTRAST  Result Date: 11/12/2021 CLINICAL DATA:  Inpatient. Septic shock. Multiple intra-abdominal abscesses, possible perforated diverticulitis. Percutaneous CT-guided transgluteal pelvic drainage catheter placed 4 days prior. EXAM: CT ABDOMEN AND PELVIS WITHOUT CONTRAST TECHNIQUE: Multidetector CT imaging of the abdomen and pelvis was performed following the standard protocol without IV contrast. RADIATION DOSE REDUCTION: This exam was performed according to the departmental dose-optimization program which includes automated exposure control, adjustment of the mA and/or kV according to patient size and/or use of iterative reconstruction technique. COMPARISON:  11/07/2021 CT abdomen/pelvis. FINDINGS: Lower chest: New complete left lower lobe atelectasis and increased moderate lingular atelectasis. Mild-to-moderate patchy ground-glass opacity throughout right middle lobe. Coronary atherosclerosis. Hepatobiliary: Diffuse hepatic steatosis. Diffusely irregular liver surface with relative hypertrophy of the lateral segment left liver lobe, compatible with cirrhosis. No liver masses. Normal gallbladder with no radiopaque cholelithiasis. No biliary ductal dilatation. Pancreas: Normal, with no mass or duct dilation. Spleen: Normal size. No mass. Adrenals/Urinary Tract: Left adrenal 2.5 cm nodule with density 29 HU, stable size. Right adrenal 1.5 cm nodule with density 21 HU, stable size. No hydronephrosis. No renal stones. Multiple simple bilateral renal cysts, largest 5.7 cm in anterior upper left kidney and subcentimeter hyperdense left renal cortical foci that are too small to characterize, unchanged, for which no follow-up is recommended. Small amount of gas in the non dependent bladder, which is mildly distended and otherwise normal. Stomach/Bowel:  Enteric tube terminates in the body of the stomach. Stomach is nondistended and otherwise normal. Normal caliber small bowel with no small bowel wall thickening. Appendix appears normal. Oral contrast transits to the rectum. Scattered diverticulosis throughout the colon, most prominent in the sigmoid colon. Mild wall thickening and pericolonic fat stranding in the proximal sigmoid colon, similar. Vascular/Lymphatic: Atherosclerotic nonaneurysmal abdominal aorta. No pathologically enlarged lymph nodes in the abdomen or pelvis. Reproductive: Mild prostatomegaly. Other: Right transgluteal percutaneous pigtail drain terminates between the rectum and bladder to the left of midline, with no residual fluid collection in this location. Gas containing 6.4 x 4.8 x 5.0 cm (volume = 80 cm^3) right lower quadrant collection (series 2/image 60), previously 7.0 x 3.5 x 3.9 cm (volume = 50 cm^3) using similar measurement technique, overall mildly increased. Irregular gas containing left lower quadrant mesenteric 8.0 x 5.7 cm collection (series 2/image 60), increased from 5.9 x 4.0 cm. Multiple new interloop  fluid collections in the left greater than right small bowel mesentery, largest 5.8 x 3.5 cm in the left small bowel mesentery (series 2/image 45). Musculoskeletal: No aggressive appearing focal osseous lesions. Left total hip arthroplasty. Marked thoracolumbar spondylosis. IMPRESSION: 1. Multifocal intraperitoneal abscesses with mixed interval changes. 2. Right transgluteal percutaneous pigtail drain terminates between the rectum and bladder to the left of midline, with no residual fluid collection in this location. 3. Gas-containing right lower quadrant collection is mildly increased. Irregular gas-containing left lower quadrant mesenteric collection, increased. Multiple new interloop fluid collections in the small bowel mesentery. 4. New complete left lower lobe atelectasis and increased moderate lingular atelectasis.  Mild-to-moderate patchy ground-glass opacity throughout the right middle lobe, nonspecific, probably inflammatory. 5. Cirrhosis. No liver masses. 6. Stable bilateral adrenal nodules with indeterminate density. Follow-up CT abdomen without and with IV contrast suggested in 12 months. 7. Small amount of gas in the non dependent bladder, nonspecific, correlate for history of recent bladder instrumentation. 8. Aortic Atherosclerosis (ICD10-I70.0). Electronically Signed   By: Ilona Sorrel M.D.   On: 11/12/2021 11:50    Cardiac Studies   TTE 11/09/2021    1. Left ventricular ejection fraction, by estimation, is 60 to 65%. The  left ventricle has normal function. The left ventricle has no regional  wall motion abnormalities. There is mild concentric left ventricular  hypertrophy. Left ventricular diastolic  parameters were normal.   2. Right ventricular systolic function is normal. The right ventricular  size is mildly enlarged. Tricuspid regurgitation signal is inadequate for  assessing PA pressure.   3. The mitral valve is grossly normal. Trivial mitral valve  regurgitation. No evidence of mitral stenosis.   4. The aortic valve is tricuspid. There is mild calcification of the  aortic valve. Aortic valve regurgitation is not visualized. Aortic valve  sclerosis/calcification is present, without any evidence of aortic  stenosis.   5. There is mild dilatation of the ascending aorta, measuring 42 mm.   6. The inferior vena cava is dilated in size with >50% respiratory  variability, suggesting right atrial pressure of 8 mmHg.    Patient Profile     66 y.o. male with a hx of HTN, type 2 DM, CKD II, osteoarthritis,  obesity, tobacco abuse, who is being seen for the evaluation of A fib RVR at the request of Dr Lamonte Sakai  Assessment & Plan    Atrial fibrillation with RVR, new diagnosis in the setting of septic shock -converted to SR PM 6/10 -chadsvasc=2.  -on heparin drip, convert to DOAC when  acceptable from surgical/medicine perspective -continue metoprolol tartrate 50 mg BID -now off diltiazem drip -Echo with normal EF -keep K>4, Mg >2  Elevated troponin: flat, likely 2/2 RVR -not ACS, with start of heparin and likely plans for anticoagulation will stop aspirin  Septic shock, resolved, 2/2 intra-abdominal abscesses and s/p IR drain placement -complicated by AKI and transaminitis, improving -antibiotics, drain per surgery/medicine teams  [facility] will sign off.   Medication Recommendations:   Continue metoprolol, will consolidate to 100 mg daily starting tomorrow Start DOAC when clear from surgery/medicine perspective Other recommendations (labs, testing, etc):  none Follow up as an outpatient:  We will arrange for outpatient follow up with Dr. Harl Bowie or a member of her team.   For questions or updates, please contact [facility] Please consult [email or web address] for contact info under     Signed, Buford Dresser, MD  11/14/2021, 10:14 AM

## 2021-11-14 NOTE — Progress Notes (Signed)
Physical Therapy Treatment Patient Details Name: Robert Larson MRN: [id number] DOB: [birth year] Today's Date: 11/14/2021   History of Present Illness 66 yo male admited with abd abscess s/p transgluteal drain, sepsis, GI bleed. Hx of DM, CKD, OA, LBP with sciatica.  On 11/10/21 pt with new onset A-Fib with RVR.    PT Comments    Pt very cooperative and up to [facility] for toileting before ambulating into hall for first time.  Increased time and distance ltd by fatigue but improvement noted in activity tolerance and level of assist required for all tasks. Pt hopeful for transfer up from ICU this date.  Recommendations for follow up therapy are one component of a multi-disciplinary discharge planning process, led by the attending physician.  Recommendations may be updated based on patient status, additional functional criteria and insurance authorization.  Follow Up Recommendations  Home health PT     Assistance Recommended at Discharge Frequent or constant Supervision/Assistance  Patient can return home with the following A lot of help with bathing/dressing/bathroom;A lot of help with walking and/or transfers;Assist for transportation;Help with stairs or ramp for entrance;Assistance with cooking/housework   Equipment Recommendations  Rolling walker (2 wheels)    Recommendations for Other Services OT consult     Precautions / Restrictions Precautions Precautions: Fall Precaution Comments: drain R gluteal, NG tube, Aline....lots of lines Restrictions Weight Bearing Restrictions: No     Mobility  Bed Mobility Overal bed mobility: Needs Assistance Bed Mobility: Supine to Sit     Supine to sit: Min guard, HOB elevated     General bed mobility comments: Modified roll with HOB elevated and use of bed rail    Transfers Overall transfer level: Needs assistance Equipment used: Rolling walker (2 wheels) Transfers: Sit to/from Stand, Bed to chair/wheelchair/BSC Sit to Stand: Min  assist, +2 safety/equipment   Step pivot transfers: Min assist       General transfer comment: Assist to power up, stabilize, control descent. Cues for safety, technique.  Step pvt bed to [facility]    Ambulation/Gait Ambulation/Gait assistance: Min assist, +2 safety/equipment Gait Distance (Feet): 36 Feet Assistive device: Rolling walker (2 wheels) Gait Pattern/deviations: Step-to pattern, Step-through pattern, Decreased step length - right, Decreased step length - left, Shuffle, Trunk flexed Gait velocity: decr     General Gait Details: cues for posture and position from [employer]             Wheelchair Mobility    Modified Rankin (Stroke Patients Only)       Balance Overall balance assessment: Needs assistance Sitting-balance support: Feet supported, No upper extremity supported Sitting balance-Leahy Scale: Good     Standing balance support: Single extremity supported Standing balance-Leahy Scale: Poor                              Cognition Arousal/Alertness: Awake/alert Behavior During Therapy: WFL for tasks assessed/performed, Flat affect Overall Cognitive Status: Within Functional Limits for tasks assessed                                          Exercises      General Comments        Pertinent Vitals/Pain Pain Assessment Pain Assessment: Faces Faces Pain Scale: Hurts a little bit Pain Location: abdomen, gluteal area (drain site) Pain Descriptors / Indicators: Discomfort, Grimacing,  Guarding Pain Intervention(s): Limited activity within patient's tolerance, Monitored during session    Home Living                          Prior Function            PT Goals (current goals can now be found in the care plan section) Acute Rehab PT Goals Patient Stated Goal: Regain IND PT Goal Formulation: With patient/family Time For Goal Achievement: 11/24/21 Potential to Achieve Goals: Good Progress towards PT goals:  Progressing toward goals    Frequency    Min 3X/week      PT Plan Current plan remains appropriate    Co-evaluation PT/OT/SLP Co-Evaluation/Treatment: Yes Reason for Co-Treatment: For patient/therapist safety;To address functional/ADL transfers PT goals addressed during session: Mobility/safety with mobility OT goals addressed during session: ADL's and self-care      AM-PAC PT "6 Clicks" Mobility   Outcome Measure  Help needed turning from your back to your side while in a flat bed without using bedrails?: A Little Help needed moving from lying on your back to sitting on the side of a flat bed without using bedrails?: A Little Help needed moving to and from a bed to a chair (including a wheelchair)?: A Little Help needed standing up from a chair using your arms (e.g., wheelchair or bedside chair)?: A Little Help needed to walk in hospital room?: A Little Help needed climbing 3-5 steps with a railing? : A Lot 6 Click Score: 17    End of Session   Activity Tolerance: Patient limited by fatigue Patient left: in chair;with call bell/phone within reach;with family/visitor present Nurse Communication: Mobility status PT Visit Diagnosis: Muscle weakness (generalized) (M62.81);Difficulty in walking, not elsewhere classified (R26.2)     Time: 3086-5784 PT Time Calculation (min) (ACUTE ONLY): 28 min  Charges:  $Gait Training: 8-22 mins                     [place] Pager [phone] Office [phone]    Robert Larson 11/14/2021, 3:58 PM

## 2021-11-15 DIAGNOSIS — K651 Peritoneal abscess: Secondary | ICD-10-CM | POA: Diagnosis not present

## 2021-11-15 LAB — CBC
HCT: 38 % — ABNORMAL LOW (ref 39.0–52.0)
Hemoglobin: 12.1 g/dL — ABNORMAL LOW (ref 13.0–17.0)
MCH: 31.4 pg (ref 26.0–34.0)
MCHC: 31.8 g/dL (ref 30.0–36.0)
MCV: 98.7 fL (ref 80.0–100.0)
Platelets: 206 10*3/uL (ref 150–400)
RBC: 3.85 MIL/uL — ABNORMAL LOW (ref 4.22–5.81)
RDW: 14.4 % (ref 11.5–15.5)
WBC: 13 10*3/uL — ABNORMAL HIGH (ref 4.0–10.5)
nRBC: 0 % (ref 0.0–0.2)

## 2021-11-15 LAB — COMPREHENSIVE METABOLIC PANEL
ALT: 53 U/L — ABNORMAL HIGH (ref 0–44)
AST: 66 U/L — ABNORMAL HIGH (ref 15–41)
Albumin: 2 g/dL — ABNORMAL LOW (ref 3.5–5.0)
Alkaline Phosphatase: 43 U/L (ref 38–126)
Anion gap: 9 (ref 5–15)
BUN: 35 mg/dL — ABNORMAL HIGH (ref 8–23)
CO2: 22 mmol/L (ref 22–32)
Calcium: 8 mg/dL — ABNORMAL LOW (ref 8.9–10.3)
Chloride: 110 mmol/L (ref 98–111)
Creatinine, Ser: 1.32 mg/dL — ABNORMAL HIGH (ref 0.61–1.24)
GFR, Estimated: >60 mL/min (ref >60)
Glucose, Bld: 96 mg/dL (ref 70–99)
Potassium: 3.4 mmol/L — ABNORMAL LOW (ref 3.5–5.1)
Sodium: 141 mmol/L (ref 135–145)
Total Bilirubin: 1.1 mg/dL (ref 0.3–1.2)
Total Protein: 6.3 g/dL — ABNORMAL LOW (ref 6.5–8.1)

## 2021-11-15 LAB — GLUCOSE, CAPILLARY
Glucose-Capillary: 107 mg/dL — ABNORMAL HIGH (ref 70–99)
Glucose-Capillary: 115 mg/dL — ABNORMAL HIGH (ref 70–99)
Glucose-Capillary: 81 mg/dL (ref 70–99)
Glucose-Capillary: 103 mg/dL — ABNORMAL HIGH (ref 70–99)
Glucose-Capillary: 91 mg/dL (ref 70–99)

## 2021-11-15 LAB — MAGNESIUM: Magnesium: 1.8 mg/dL (ref 1.7–2.4)

## 2021-11-15 LAB — HEPARIN LEVEL (UNFRACTIONATED): Heparin Unfractionated: 0.35 IU/mL (ref 0.30–0.70)

## 2021-11-15 MED ORDER — ENSURE ENLIVE PO LIQD
237.0000 mL | Freq: Two times a day (BID) | ORAL | Status: DC
  Administered 2021-11-15 – 2021-11-24 (×3): 237 mL via ORAL

## 2021-11-15 MED ORDER — OXYCODONE HCL 5 MG PO TABS
5.0000 mg | ORAL_TABLET | ORAL | Status: DC | PRN
  Administered 2021-11-15 – 2021-11-20 (×13): 5 mg via ORAL
  Filled 2021-11-15 (×13): qty 1

## 2021-11-15 MED ORDER — PANTOPRAZOLE SODIUM 40 MG PO TBEC
40.0000 mg | DELAYED_RELEASE_TABLET | Freq: Two times a day (BID) | ORAL | Status: DC
  Administered 2021-11-15 – 2021-11-26 (×22): 40 mg via ORAL
  Filled 2021-11-15 (×22): qty 1

## 2021-11-15 MED ORDER — POTASSIUM CHLORIDE 20 MEQ PO PACK
40.0000 meq | PACK | Freq: Once | ORAL | Status: AC
  Administered 2021-11-15: 40 meq via ORAL
  Filled 2021-11-15: qty 2

## 2021-11-15 MED ORDER — IPRATROPIUM-ALBUTEROL 0.5-2.5 (3) MG/3ML IN SOLN
3.0000 mL | Freq: Two times a day (BID) | RESPIRATORY_TRACT | Status: DC
  Administered 2021-11-15 – 2021-11-17 (×4): 3 mL via RESPIRATORY_TRACT
  Filled 2021-11-15 (×4): qty 3

## 2021-11-15 NOTE — Telephone Encounter (Signed)
Pt is still in hospital, awaiting call back from daughter to see if a PCP at the hospital is able to fill out FMLA paper work due to Remy not being able to do a hospital follow up.

## 2021-11-15 NOTE — Telephone Encounter (Signed)
so I'm guessing this is because he's been in the hospital? But he needs to schedule a hospital f/u visit, have not seen him for [facility] before.  thx

## 2021-11-15 NOTE — Progress Notes (Addendum)
[facility] for IV heparin Indication: atrial fibrillation  No Known Allergies  Patient Measurements: Height: 5' 6.93" (170 cm) Weight: 123.5 kg (272 lb 4.3 oz) IBW/kg (Calculated) : 65.94 Heparin Dosing Weight: 95 kg  Assessment: 24 yoM with PMH DM2, HTN, CKD, admitted for intra-abdominal abscess; IR drain placed but otherwise conservative management per Surgery. Developed new Afib overnight 6/7, and Pharmacy consulted to dose IV heparin  Today, 11/15/2021: Heparin level remains therapeutic on 2600 units/hr  CBC stable No complications of therapy noted  Goal of Therapy: Heparin level 0.3-0.7 units/ml Monitor platelets by anticoagulation protocol: Yes  Plan: Continue Heparin gtt at 2600 units/hr  Daily CBC and heparin level Monitor for signs of bleeding or thrombosis F/u plans for transition to DOAC IV PPI to [place], Pharm.D 11/15/2021 7:29 AM

## 2021-11-15 NOTE — Progress Notes (Signed)
PROGRESS NOTE    Robert Larson  QIW:[id number] DOB: 1955/12/10 DOA: 11/07/2021 PCP: Jeanie Sewer, NP   Brief Narrative:  66 year old with history of DM2, HTN, CKD admitted to the hospital for severe sepsis due to intra-abdominal abscess.  CT abdomen pelvis showed multiple loculated fluid collection in the lower abdomen with a large cavity in the pelvis measuring 11.5 X6X 11.7 cm.  Due to septic shock he was transferred to the ICU on 6/5.  Patient was seen by IR and drain was placed.  Hospital course was also complicated by acute kidney injury, hypoxia and atrial fibrillation with RVR.  Repeat CT showed resolution of abscess or the drain is in place but does have other pockets of fluid collection.  Patient is also on Cardizem drip, slowly uptitrating beta-blocker and heparin drip.   Assessment & Plan:  Principal Problem:   Intra-abdominal abscesses s/p perc drainage 11/08/2021 Active Problems:   Essential hypertension   Type 2 diabetes mellitus with morbid obesity (HCC)   Diverticulosis   Hyponatremia   Acute renal failure superimposed on chronic kidney disease (HCC)   Sleep apnea   Osteoarthritis   CKD (chronic kidney disease) stage 3, GFR 30-59 ml/min (HCC)   Adrenal nodule (HCC)   History of adenomatous polyp of colon   Spinal stenosis of lumbar region   Renal cyst   Cholelithiasis   Coffee ground emesis   Septic shock (HCC)   Tobacco abuse    Septic shock secondary to pulm intra-abdominal abscess - Shock physiology has improved with.  Status post drain placed by IR. - General surgery to decide if patient will need further surgical intervention - Antibiotic-Zosyn - On clear liquid diet, advance per general surgery - Repeat CT 6/9 shows persistent abscess with resolution of collection where drain is in place.  IR is following.  Acute hypoxic respiratory distress History of obstructive sleep apnea - Combination of pulmonary edema, possible underlying infection -  Nocturnal CPAP, often refusing this at night.  Supplemental oxygen as needed  Atrial fibrillation with RVR, improved - No prior history.  Echocardiogram within normal limits.  Seen by cardiology team - TSH- Normal - Off Cardizem drip, now on Toprol XL 100 mg daily. - Currently on heparin drip, transition to p.o. once cleared by general surgery  Acute kidney injury on CKD stage IIIa, resolved - Suspect ATN.  Baseline creatinine 1.6.  Creatinine peaked at 3.7.  Today 1.32 - Gentle fluids. Monitor vol status.   Hypernatremia with elevated chloride, improved Hypokalemia - Likely from fluid effusion.  Half-normal saline.  Monitor volume status. -Replete as needed  Transaminitis - Suspect from septic shock.  Appears to be slowly improving  Acute anemia - PPI.  Hemoglobin now stable.  Hemoglobin stable at 12.9  Essential hypertension - Holding medication due to septic shock  Hyperglycemia - Sliding scale and Accu-Cheks      DVT prophylaxis: Heparin drip Code Status: Full code Family Communication: Daughter was at the bedside  Status is: Inpatient Remains inpatient appropriate because: Multiple ongoing issues, not stable for dc. Will be here for atleast 2-4 days     Subjective: Patient sitting, on recliner, does have any complaints this morning besides some lower abdominal discomfort.  Heart rate remains in normal sinus rhythm in 60s.  Examination: Constitutional: Not in acute distress Respiratory: Minimal bibasilar rhonchi Cardiovascular: Normal sinus rhythm, no rubs Abdomen: Nontender nondistended good bowel sounds Musculoskeletal: No edema noted Skin: No rashes seen Neurologic: CN 2-12 grossly intact.  And  nonfocal Psychiatric: Normal judgment and insight. Alert and oriented x 3. Normal mood. Drain in place.  Left IJ central line in place  Objective: Vitals:   11/14/21 1946 11/14/21 2004 11/14/21 2334 11/15/21 0422  BP:  (!) 160/73  (!) 150/89  Pulse:  73  63   Resp:  20  18  Temp:  98.2 F (36.8 C)  98.2 F (36.8 C)  TempSrc:  Oral  Oral  SpO2: 94% 94%  96%  Weight:   123.5 kg   Height:        Intake/Output Summary (Last 24 hours) at 11/15/2021 0826 Last data filed at 11/15/2021 0423 Gross per 24 hour  Intake 2710.58 ml  Output 2000 ml  Net 710.58 ml   Filed Weights   11/11/21 0000 11/13/21 0000 11/14/21 2334  Weight: 124 kg 119 kg 123.5 kg     Data Reviewed:   CBC: Recent Labs  Lab 11/09/21 0110 11/09/21 0400 11/11/21 0704 11/12/21 0407 11/13/21 0404 11/14/21 0423 11/15/21 0333  WBC 22.8*   < > 14.1* 14.6* 16.7* 15.2* 13.0*  NEUTROABS 20.8*  --   --   --   --   --   --   HGB 14.6   < > 13.0 12.9* 12.7* 13.3 12.1*  HCT 44.3   < > 39.6 40.4 40.0 41.2 38.0*  MCV 96.5   < > 98.5 99.0 99.3 99.0 98.7  PLT 222   < > 172 177 181 200 206   < > = values in this interval not displayed.   Basic Metabolic Panel: Recent Labs  Lab 11/11/21 0000 11/11/21 0704 11/12/21 0407 11/13/21 0404 11/14/21 0423 11/15/21 0333  NA 146* 146* 149* 148* 143 141  K 3.6 4.0 3.7 3.7 3.6 3.4*  CL 113* 112* 116* 117* 112* 110  CO2 '24 26 25 26 24 22  '$ GLUCOSE 112* 111* 124* 112* 112* 96  BUN 79* 73* 56* 49* 43* 35*  CREATININE 2.10* 1.98* 1.54* 1.62* 1.51* 1.32*  CALCIUM 8.3* 8.6* 8.4* 8.1* 8.1* 8.0*  MG 2.9*  --  2.3 1.9 2.1 1.8  PHOS  --   --  2.8  --   --   --    GFR: Estimated Creatinine Clearance: 71.1 mL/min (A) (by C-G formula based on SCr of 1.32 mg/dL (H)). Liver Function Tests: Recent Labs  Lab 11/09/21 0400 11/11/21 0704 11/13/21 0404 11/14/21 0423 11/15/21 0333  AST 102* 88* 68* 71* 66*  ALT 76* 57* 48* 54* 53*  ALKPHOS 40 52 45 48 43  BILITOT 1.2 1.0 1.1 1.1 1.1  PROT 5.9* 6.0* 6.1* 6.8 6.3*  ALBUMIN 2.1* 1.9* 1.9* 2.1* 2.0*   No results for input(s): "LIPASE", "AMYLASE" in the last 168 hours.  No results for input(s): "AMMONIA" in the last 168 hours. Coagulation Profile: Recent Labs  Lab 11/09/21 0616  11/12/21 1327  INR 2.1* 1.3*   Cardiac Enzymes: No results for input(s): "CKTOTAL", "CKMB", "CKMBINDEX", "TROPONINI" in the last 168 hours. BNP (last 3 results) No results for input(s): "PROBNP" in the last 8760 hours. HbA1C: No results for input(s): "HGBA1C" in the last 72 hours. CBG: Recent Labs  Lab 11/14/21 1519 11/14/21 2005 11/14/21 2332 11/15/21 0422 11/15/21 0714  GLUCAP 94 107* 104* 107* 115*   Lipid Profile: No results for input(s): "CHOL", "HDL", "LDLCALC", "TRIG", "CHOLHDL", "LDLDIRECT" in the last 72 hours. Thyroid Function Tests: No results for input(s): "TSH", "T4TOTAL", "FREET4", "T3FREE", "THYROIDAB" in the last 72 hours.  Anemia Panel: No  results for input(s): "VITAMINB12", "FOLATE", "FERRITIN", "TIBC", "IRON", "RETICCTPCT" in the last 72 hours. Sepsis Labs: Recent Labs  Lab 11/08/21 2345 11/09/21 0246 11/09/21 0730  LATICACIDVEN 5.0* 4.9* 3.5*    Recent Results (from the past 240 hour(s))  Culture, blood (routine x 2)     Status: None   Collection Time: 11/07/21  3:10 PM   Specimen: BLOOD RIGHT FOREARM  Result Value Ref Range Status   Specimen Description   Final    BLOOD RIGHT FOREARM Performed at [facility], [address] [place], [place] [address]    Special Requests   Final    BOTTLES DRAWN AEROBIC AND ANAEROBIC Blood Culture adequate volume Performed at [facility], [address] [place], [place] [address]    Culture   Final    NO GROWTH 5 DAYS Performed at [facility], [place] [address] [place], [place] [address]    Report Status 11/12/2021 FINAL  Final  Culture, blood (routine x 2)     Status: None   Collection Time: 11/07/21  3:12 PM   Specimen: BLOOD LEFT FOREARM  Result Value Ref Range Status   Specimen Description   Final    BLOOD LEFT FOREARM Performed at [facility], [address] [place], [place] [address]    Special Requests   Final    BOTTLES  DRAWN AEROBIC AND ANAEROBIC Blood Culture adequate volume Performed at [facility], [address], [place], [place] [address]    Culture   Final    NO GROWTH 5 DAYS Performed at [facility], [place] [address] [place], [place] [address]    Report Status 11/12/2021 FINAL  Final  Surgical PCR screen     Status: None   Collection Time: 11/08/21  2:06 AM   Specimen: Nasal Mucosa; Nasal Swab  Result Value Ref Range Status   MRSA, PCR NEGATIVE NEGATIVE Final   Staphylococcus aureus NEGATIVE NEGATIVE Final    Comment: (NOTE) The Xpert SA Assay (FDA approved for NASAL specimens in patients 73 years of age and older), is one component of a comprehensive surveillance program. It is not intended to diagnose infection nor to guide or monitor treatment. Performed at [facility], [place] [address], [place], [place] [address]   Aerobic/Anaerobic Culture w Gram Stain (surgical/deep wound)     Status: None   Collection Time: 11/08/21  1:25 PM   Specimen: Abscess  Result Value Ref Range Status   Specimen Description   Final    ABSCESS DEEP PELVIC DRAIN Performed at [place] [address] [place], [place] [address]    Special Requests   Final    NONE Performed at [facility], [place] [address], [place], [place] [address]    Gram Stain   Final    RARE WBC PRESENT,BOTH PMN AND MONONUCLEAR ABUNDANT GRAM POSITIVE COCCI IN PAIRS MODERATE GRAM NEGATIVE RODS RARE GRAM POSITIVE RODS Performed at [facility], [place] [address] [place],  [address]    Culture   Final    MODERATE ESCHERICHIA COLI MIXED ANAEROBIC FLORA PRESENT.  CALL LAB IF FURTHER IID REQUIRED.    Report Status 11/11/2021 FINAL  Final   Organism ID, Bacteria ESCHERICHIA COLI  Final      Susceptibility   Escherichia coli - MIC*    AMPICILLIN 4 SENSITIVE Sensitive     CEFAZOLIN <=4 SENSITIVE Sensitive     CEFEPIME <=0.12 SENSITIVE  Sensitive  CEFTAZIDIME <=1 SENSITIVE Sensitive     CEFTRIAXONE <=0.25 SENSITIVE Sensitive     CIPROFLOXACIN <=0.25 SENSITIVE Sensitive     GENTAMICIN <=1 SENSITIVE Sensitive     IMIPENEM <=0.25 SENSITIVE Sensitive     TRIMETH/SULFA <=20 SENSITIVE Sensitive     AMPICILLIN/SULBACTAM <=2 SENSITIVE Sensitive     PIP/TAZO <=4 SENSITIVE Sensitive     * MODERATE ESCHERICHIA COLI         Radiology Studies: No results found.      Scheduled Meds:  chlorhexidine  15 mL Mouth Rinse BID   Chlorhexidine Gluconate Cloth  6 each Topical Daily   insulin aspart  0-15 Units Subcutaneous TID WC   ipratropium-albuterol  3 mL Nebulization TID   lip balm  1 application  Topical BID   mouth rinse  15 mL Mouth Rinse q12n4p   metoprolol succinate  100 mg Oral Daily   pantoprazole  40 mg Oral BID   potassium chloride  40 mEq Oral Once   sodium chloride flush  10-40 mL Intracatheter Q12H   sodium chloride flush  3 mL Intravenous Q12H   Continuous Infusions:  sodium chloride 50 mL/hr at 11/15/21 0208   sodium chloride 250 mL (11/08/21 0653)   sodium chloride Stopped (11/09/21 1144)   sodium chloride 10 mL/hr at 11/14/21 1505   heparin 2,600 Units/hr (11/15/21 0358)   ondansetron (ZOFRAN) IV     piperacillin-tazobactam (ZOSYN)  IV 3.375 g (11/15/21 0353)     LOS: 8 days   Time spent= 35 mins    Mahamed Zalewski Arsenio Loader, MD [facility]  If 7PM-7AM, please contact night-coverage  11/15/2021, 8:26 AM

## 2021-11-15 NOTE — Progress Notes (Signed)
PT Cancellation Note  Patient Details Name: Robert Larson MRN: [id number] DOB: 1956-03-22   Cancelled Treatment:    Reason Eval/Treat Not Completed: Fatigue/lethargy limiting ability to participate; tired from being up to [place] 11/15/2021, 2:56 PM

## 2021-11-15 NOTE — Telephone Encounter (Signed)
Robert Larson is requesting call back with update at [phone].  States matrix has faxed FMLA several times.

## 2021-11-15 NOTE — Progress Notes (Signed)
Progress Note     Subjective: Pt still having some abdominal pain but better than last week. A lot of cramping gas-like pains. He is having some small loose BMs. Tolerating CLD and denies nausea or vomiting. Daughter at bedside.   Objective: Vital signs in last 24 hours: Temp:  [98 F (36.7 C)-98.6 F (37 C)] 98.2 F (36.8 C) (06/12 0422) Pulse Rate:  [62-73] 63 (06/12 0422) Resp:  [11-27] 18 (06/12 0422) BP: (143-174)/(73-111) 150/89 (06/12 0422) SpO2:  [91 %-98 %] 98 % (06/12 0904) Weight:  [123.5 kg] 123.5 kg (06/11 2334) Last BM Date : 11/14/21  Intake/Output from previous day: 06/11 0701 - 06/12 0700 In: 2710.6 [P.O.:880; I.V.:1691.8; IV Piggyback:138.7] Out: 2000 [Urine:2000] Intake/Output this shift: Total I/O In: 10 [I.V.:10] Out: -   PE: General: pleasant, WD, obese male Heart: regular, rate, and rhythm.   Lungs: normal effort  Abd: soft, less TTP along left flank and lower abdomen, no peritonitis or guarding, ND, TG drain with scant serous fluid  Psych: A&Ox3 with an appropriate affect.    Lab Results:  Recent Labs    11/14/21 0423 11/15/21 0333  WBC 15.2* 13.0*  HGB 13.3 12.1*  HCT 41.2 38.0*  PLT 200 206   BMET Recent Labs    11/14/21 0423 11/15/21 0333  NA 143 141  K 3.6 3.4*  CL 112* 110  CO2 24 22  GLUCOSE 112* 96  BUN 43* 35*  CREATININE 1.51* 1.32*  CALCIUM 8.1* 8.0*   PT/INR Recent Labs    11/12/21 1327  LABPROT 16.4*  INR 1.3*   CMP     Component Value Date/Time   NA 141 11/15/2021 0333   NA 133 (A) 06/24/2021 0000   K 3.4 (L) 11/15/2021 0333   CL 110 11/15/2021 0333   CO2 22 11/15/2021 0333   GLUCOSE 96 11/15/2021 0333   BUN 35 (H) 11/15/2021 0333   BUN 18 06/24/2021 0000   CREATININE 1.32 (H) 11/15/2021 0333   CALCIUM 8.0 (L) 11/15/2021 0333   PROT 6.3 (L) 11/15/2021 0333   ALBUMIN 2.0 (L) 11/15/2021 0333   AST 66 (H) 11/15/2021 0333   ALT 53 (H) 11/15/2021 0333   ALKPHOS 43 11/15/2021 0333   BILITOT 1.1  11/15/2021 0333   GFRNONAA >60 11/15/2021 0333   GFRAA >60 02/19/2018 1123   Lipase     Component Value Date/Time   LIPASE 40 11/07/2021 1029       Studies/Results: No results found.  Anti-infectives: Anti-infectives (From admission, onward)    Start     Dose/Rate Route Frequency Ordered Stop   11/08/21 0730  vancomycin (VANCOREADY) IVPB 1750 mg/350 mL        1,750 mg 175 mL/hr over 120 Minutes Intravenous  Once 11/08/21 0641 11/08/21 0908   11/08/21 0641  vancomycin variable dose per unstable renal function (pharmacist dosing)  Status:  Discontinued         Does not apply See admin instructions 11/08/21 0641 11/08/21 1138   11/07/21 2000  piperacillin-tazobactam (ZOSYN) IVPB 3.375 g        3.375 g 12.5 mL/hr over 240 Minutes Intravenous Every 8 hours 11/07/21 1921     11/07/21 1430  piperacillin-tazobactam (ZOSYN) IVPB 3.375 g        3.375 g 100 mL/hr over 30 Minutes Intravenous  Once 11/07/21 1418 11/07/21 1535        Assessment/Plan Multiple intraabdominal abscesses - ?likely diverticular - s/p IR drain placement 6/5 - drainage purulent,  Cx with pan-sensitive E.coli  - repeat CT AP 6/9 - with resolution of large abscess but some persistent interloop collections  - WBC 13, afebrile, continue IV abx - ttp along left and lower abdomen, no peritonitis and abdomen is soft - ileus seems to be resolving - ok to advance to FLD today  - no indication for emergent surgical intervention    FEN: FLD, IVF per TRH VTE: SCDs, hep gtt ID: Zosyn 6/4>>   - per CCM -  A. Fib with RVR - improved, cards following  AKI - Cr 1.32, improving  Acute respiratory failure with hypoxia - improved OSA Morbid obesity - BMI 43.01   LOS: 8 days   I reviewed Consultant cardiology notes, hospitalist notes, last 24 h vitals and pain scores, last 48 h intake and output, last 24 h labs and trends, and last 24 h imaging results.    Norm Parcel, [facility] 11/15/2021,  12:41 PM Please see Amion for pager number during day hours 7:00am-4:30pm

## 2021-11-15 NOTE — Telephone Encounter (Signed)
Pt's daughter called back stating hospital told her that Colletta [place] will have to complete this at pt's hospital follow up. States that her FMLA deadline in 12/02/21.

## 2021-11-15 NOTE — TOC Progression Note (Signed)
Transition of Care [facility]) - Progression Note    Patient Details  Name: Robert Larson MRN: [id number] Date of Birth: [birth year]  [facility] CM/SW Contact  Leeroy Cha, RN Phone Number: 11/15/2021, 8:31 AM  Clinical Narrative:    Following for hhc and toc needs.     Barriers to Discharge: Continued Medical Work up  Expected Discharge Plan and Services                                                 Social Determinants of Health (SDOH) Interventions    Readmission Risk Interventions     No data to display

## 2021-11-15 NOTE — Progress Notes (Signed)
Pt continues to decline use of CPAP QHS.

## 2021-11-15 NOTE — Telephone Encounter (Signed)
..  Type of form received: FMLA  Additional comments: requested by Francis Gaines  Received by: Sunnie Nielsen should be Faxed to: [phone]  Form should be mailed to:    Is patient requesting call for pickup:   Form placed:  In Provider's box  Attach charge sheet. Yes  Individual made aware of 3-5 business day turn around (Y/N)? Yes

## 2021-11-16 DIAGNOSIS — K651 Peritoneal abscess: Secondary | ICD-10-CM | POA: Diagnosis not present

## 2021-11-16 LAB — CBC
HCT: 37.4 % — ABNORMAL LOW (ref 39.0–52.0)
Hemoglobin: 12.3 g/dL — ABNORMAL LOW (ref 13.0–17.0)
MCH: 32.4 pg (ref 26.0–34.0)
MCHC: 32.9 g/dL (ref 30.0–36.0)
MCV: 98.4 fL (ref 80.0–100.0)
Platelets: 243 10*3/uL (ref 150–400)
RBC: 3.8 MIL/uL — ABNORMAL LOW (ref 4.22–5.81)
RDW: 14.4 % (ref 11.5–15.5)
WBC: 14.6 10*3/uL — ABNORMAL HIGH (ref 4.0–10.5)
nRBC: 0 % (ref 0.0–0.2)

## 2021-11-16 LAB — GLUCOSE, CAPILLARY
Glucose-Capillary: 102 mg/dL — ABNORMAL HIGH (ref 70–99)
Glucose-Capillary: 104 mg/dL — ABNORMAL HIGH (ref 70–99)
Glucose-Capillary: 126 mg/dL — ABNORMAL HIGH (ref 70–99)
Glucose-Capillary: 91 mg/dL (ref 70–99)
Glucose-Capillary: 92 mg/dL (ref 70–99)
Glucose-Capillary: 98 mg/dL (ref 70–99)
Glucose-Capillary: 98 mg/dL (ref 70–99)

## 2021-11-16 LAB — COMPREHENSIVE METABOLIC PANEL
ALT: 45 U/L — ABNORMAL HIGH (ref 0–44)
AST: 46 U/L — ABNORMAL HIGH (ref 15–41)
Albumin: 2 g/dL — ABNORMAL LOW (ref 3.5–5.0)
Alkaline Phosphatase: 48 U/L (ref 38–126)
Anion gap: 7 (ref 5–15)
BUN: 34 mg/dL — ABNORMAL HIGH (ref 8–23)
CO2: 23 mmol/L (ref 22–32)
Calcium: 8 mg/dL — ABNORMAL LOW (ref 8.9–10.3)
Chloride: 110 mmol/L (ref 98–111)
Creatinine, Ser: 1.36 mg/dL — ABNORMAL HIGH (ref 0.61–1.24)
GFR, Estimated: 58 mL/min — ABNORMAL LOW (ref >60)
Glucose, Bld: 94 mg/dL (ref 70–99)
Potassium: 3.5 mmol/L (ref 3.5–5.1)
Sodium: 140 mmol/L (ref 135–145)
Total Bilirubin: 0.9 mg/dL (ref 0.3–1.2)
Total Protein: 6.4 g/dL — ABNORMAL LOW (ref 6.5–8.1)

## 2021-11-16 LAB — MAGNESIUM: Magnesium: 1.8 mg/dL (ref 1.7–2.4)

## 2021-11-16 LAB — HEPARIN LEVEL (UNFRACTIONATED): Heparin Unfractionated: 0.41 IU/mL (ref 0.30–0.70)

## 2021-11-16 NOTE — Progress Notes (Signed)
[place] for IV heparin Indication: atrial fibrillation  No Known Allergies  Patient Measurements: Height: 5' 6.93" (170 cm) Weight: 123.5 kg (272 lb 4.3 oz) IBW/kg (Calculated) : 65.94 Heparin Dosing Weight: 95 kg  Assessment: 28 yoM with PMH DM2, HTN, CKD, admitted for intra-abdominal abscess; IR drain placed but otherwise conservative management per Surgery. Developed new Afib overnight 6/7, and Pharmacy consulted to dose IV heparin  Today, 11/16/2021: Heparin level remains therapeutic on 2600 units/hr  CBC stable No complications of therapy noted  Goal of Therapy: Heparin level 0.3-0.7 units/ml Monitor platelets by anticoagulation protocol: Yes  Plan: Continue Heparin gtt at 2600 units/hr  Daily CBC and heparin level Monitor for signs of bleeding or thrombosis CCS plans to repeat CT scan later this week to evaluate remaining fluid collections F/u plans for transition to [place] once no more invasive procedures planned  Eudelia Bunch, Pharm.D 11/16/2021 9:22 AM

## 2021-11-16 NOTE — Progress Notes (Signed)
PROGRESS NOTE    Robert Larson  ZOX:[id number] DOB: [birth year] DOA: 11/07/2021 PCP: Jeanie Sewer, NP   Brief Narrative:  66 year old with history of DM2, HTN, CKD admitted to the hospital for severe sepsis due to intra-abdominal abscess.  CT abdomen pelvis showed multiple loculated fluid collection in the lower abdomen with a large cavity in the pelvis measuring 11.5 X6X 11.7 cm.  Due to septic shock he was transferred to the ICU on 6/5.  Patient was seen by IR and drain was placed.  Hospital course was also complicated by acute kidney injury, hypoxia and atrial fibrillation with RVR.  Repeat CT showed resolution of abscess or the drain is in place but does have other pockets of fluid collection.  Patient is also on Cardizem drip, slowly uptitrating beta-blocker and heparin drip.  Patient has slowly been transitioned to p.o.  Per general surgery continue IV antibiotics, IR to remove drain today and may be repeat CT later in the week.   Assessment & Plan:  Principal Problem:   Intra-abdominal abscesses s/p perc drainage 11/08/2021 Active Problems:   Essential hypertension   Type 2 diabetes mellitus with morbid obesity (HCC)   Diverticulosis   Hyponatremia   Acute renal failure superimposed on chronic kidney disease (HCC)   Sleep apnea   Osteoarthritis   CKD (chronic kidney disease) stage 3, GFR 30-59 ml/min (HCC)   Adrenal nodule (HCC)   History of adenomatous polyp of colon   Spinal stenosis of lumbar region   Renal cyst   Cholelithiasis   Coffee ground emesis   Septic shock (HCC)   Tobacco abuse    Septic shock secondary to pulm intra-abdominal abscess - Shock physiology has improved with.  Status post drain placed by IR.  IR is consulted to remove the drain today. - General surgery to decide if patient will need further surgical intervention - Antibiotic-Zosyn - Diet per general surgery. - Repeat CT 6/9 shows persistent abscess with resolution of collection where drain is  in place.  IR is following.  Plans to repeat another scan later in the week.  Acute hypoxic respiratory distress History of obstructive sleep apnea - Combination of pulmonary edema, possible underlying infection - Nocturnal CPAP, often refusing this at night.  Supplemental oxygen as needed  Atrial fibrillation with RVR, improved - No prior history.  Echocardiogram within normal limits.  Seen by cardiology team - TSH- Normal.  Doing better on Toprol-XL - Currently running on heparin drip, if no further interventions are planned, would prefer this to be transition to p.o.  Acute kidney injury on CKD stage IIIa, resolved - Suspect ATN.  Baseline creatinine 1.6.  Creatinine peaked at 3.7.  Creatinine today 1.36 - Gentle fluids. Monitor vol status.   Hypernatremia with elevated chloride, improved Hypokalemia - This has improved with half-normal saline.  Transaminitis - Suspect from septic shock.  Appears to be slowly improving  Acute anemia - PPI.  Hemoglobin now stable.  Hemoglobin stable at 12.9  Essential hypertension - Holding medication due to septic shock  Hyperglycemia - Sliding scale and Accu-Cheks   If nurses are able to secure stable IV line, his central line from left IJ can be pulled out. I will discuss this with RN   DVT prophylaxis: Heparin drip Code Status: Full code Family Communication: Daughter present at bedside  Status is: Inpatient Remains inpatient appropriate because: Multiple ongoing issues, not stable for dc. Will be here for atleast 2-4 days     Subjective: Patient states  she feels little better this morning.  Needs less and less assistance with mobility.  Heart rate remains better controlled.  No acute events overnight.  Examination: Constitutional: Not in acute distress Respiratory: Clear to auscultation bilaterally Cardiovascular: Normal sinus rhythm, no rubs Abdomen: Nontender nondistended good bowel sounds Musculoskeletal: No edema  noted Skin: No rashes seen Neurologic: CN 2-12 grossly intact.  And nonfocal Psychiatric: Normal judgment and insight. Alert and oriented x 3. Normal mood. Drain in place.  Left IJ central line in place  Objective: Vitals:   11/15/21 2025 11/15/21 2040 11/16/21 0542 11/16/21 0815  BP: (!) 153/96  (!) 155/77   Pulse: 61  63   Resp: 17  17   Temp: 98.3 F (36.8 C)  98.3 F (36.8 C)   TempSrc: Oral  Oral   SpO2: 97% 94% 95% 98%  Weight:      Height:        Intake/Output Summary (Last 24 hours) at 11/16/2021 0834 Last data filed at 11/16/2021 3267 Gross per 24 hour  Intake 2458.71 ml  Output 1550 ml  Net 908.71 ml   Filed Weights   11/11/21 0000 11/13/21 0000 11/14/21 2334  Weight: 124 kg 119 kg 123.5 kg     Data Reviewed:   CBC: Recent Labs  Lab 11/12/21 0407 11/13/21 0404 11/14/21 0423 11/15/21 0333 11/16/21 0610  WBC 14.6* 16.7* 15.2* 13.0* 14.6*  HGB 12.9* 12.7* 13.3 12.1* 12.3*  HCT 40.4 40.0 41.2 38.0* 37.4*  MCV 99.0 99.3 99.0 98.7 98.4  PLT 177 181 200 206 124   Basic Metabolic Panel: Recent Labs  Lab 11/11/21 0000 11/11/21 0704 11/12/21 0407 11/13/21 0404 11/14/21 0423 11/15/21 0333  NA 146* 146* 149* 148* 143 141  K 3.6 4.0 3.7 3.7 3.6 3.4*  CL 113* 112* 116* 117* 112* 110  CO2 '24 26 25 26 24 22  '$ GLUCOSE 112* 111* 124* 112* 112* 96  BUN 79* 73* 56* 49* 43* 35*  CREATININE 2.10* 1.98* 1.54* 1.62* 1.51* 1.32*  CALCIUM 8.3* 8.6* 8.4* 8.1* 8.1* 8.0*  MG 2.9*  --  2.3 1.9 2.1 1.8  PHOS  --   --  2.8  --   --   --    GFR: Estimated Creatinine Clearance: 71.1 mL/min (A) (by C-G formula based on SCr of 1.32 mg/dL (H)). Liver Function Tests: Recent Labs  Lab 11/11/21 0704 11/13/21 0404 11/14/21 0423 11/15/21 0333  AST 88* 68* 71* 66*  ALT 57* 48* 54* 53*  ALKPHOS 52 45 48 43  BILITOT 1.0 1.1 1.1 1.1  PROT 6.0* 6.1* 6.8 6.3*  ALBUMIN 1.9* 1.9* 2.1* 2.0*   No results for input(s): "LIPASE", "AMYLASE" in the last 168 hours.  No results  for input(s): "AMMONIA" in the last 168 hours. Coagulation Profile: Recent Labs  Lab 11/12/21 1327  INR 1.3*   Cardiac Enzymes: No results for input(s): "CKTOTAL", "CKMB", "CKMBINDEX", "TROPONINI" in the last 168 hours. BNP (last 3 results) No results for input(s): "PROBNP" in the last 8760 hours. HbA1C: No results for input(s): "HGBA1C" in the last 72 hours. CBG: Recent Labs  Lab 11/15/21 1609 11/15/21 2029 11/16/21 0021 11/16/21 0405 11/16/21 0749  GLUCAP 103* 91 104* 91 126*   Lipid Profile: No results for input(s): "CHOL", "HDL", "LDLCALC", "TRIG", "CHOLHDL", "LDLDIRECT" in the last 72 hours. Thyroid Function Tests: No results for input(s): "TSH", "T4TOTAL", "FREET4", "T3FREE", "THYROIDAB" in the last 72 hours.  Anemia Panel: No results for input(s): "VITAMINB12", "FOLATE", "FERRITIN", "TIBC", "IRON", "RETICCTPCT"  in the last 72 hours. Sepsis Labs: No results for input(s): "PROCALCITON", "LATICACIDVEN" in the last 168 hours.   Recent Results (from the past 240 hour(s))  Culture, blood (routine x 2)     Status: None   Collection Time: 11/07/21  3:10 PM   Specimen: BLOOD RIGHT FOREARM  Result Value Ref Range Status   Specimen Description   Final    BLOOD RIGHT FOREARM Performed at [facility], [address], [place], [place] [address]    Special Requests   Final    BOTTLES DRAWN AEROBIC AND ANAEROBIC Blood Culture adequate volume Performed at [facility], [address] [place], [place] [address]    Culture   Final    NO GROWTH 5 DAYS Performed at [facility], [place] [address], [place], [place] [address]    Report Status 11/12/2021 FINAL  Final  Culture, blood (routine x 2)     Status: None   Collection Time: 11/07/21  3:12 PM   Specimen: BLOOD LEFT FOREARM  Result Value Ref Range Status   Specimen Description   Final    BLOOD LEFT FOREARM Performed at [facility], [address],  [place], [place] [address]    Special Requests   Final    BOTTLES DRAWN AEROBIC AND ANAEROBIC Blood Culture adequate volume Performed at [facility], [address], [place], [place] [address]    Culture   Final    NO GROWTH 5 DAYS Performed at [facility], [place] [address], [place], [place] [address]    Report Status 11/12/2021 FINAL  Final  Surgical PCR screen     Status: None   Collection Time: 11/08/21  2:06 AM   Specimen: Nasal Mucosa; Nasal Swab  Result Value Ref Range Status   MRSA, PCR NEGATIVE NEGATIVE Final   Staphylococcus aureus NEGATIVE NEGATIVE Final    Comment: (NOTE) The Xpert SA Assay (FDA approved for NASAL specimens in patients 31 years of age and older), is one component of a comprehensive surveillance program. It is not intended to diagnose infection nor to guide or monitor treatment. Performed at [facility], [place] [address], [place], [place] [address]   Aerobic/Anaerobic Culture w Gram Stain (surgical/deep wound)     Status: None   Collection Time: 11/08/21  1:25 PM   Specimen: Abscess  Result Value Ref Range Status   Specimen Description   Final    ABSCESS DEEP PELVIC DRAIN Performed at [place] [address], [place], [place] [address]    Special Requests   Final    NONE Performed at [facility], [place] [address] [place], [place] [address]    Gram Stain   Final    RARE WBC PRESENT,BOTH PMN AND MONONUCLEAR ABUNDANT GRAM POSITIVE COCCI IN PAIRS MODERATE GRAM NEGATIVE RODS RARE GRAM POSITIVE RODS Performed at [facility], [place] [address] [place],  [address]    Culture   Final    MODERATE ESCHERICHIA COLI MIXED ANAEROBIC FLORA PRESENT.  CALL LAB IF FURTHER IID REQUIRED.    Report Status 11/11/2021 FINAL  Final   Organism ID, Bacteria ESCHERICHIA COLI  Final      Susceptibility   Escherichia coli - MIC*    AMPICILLIN 4 SENSITIVE Sensitive      CEFAZOLIN <=4 SENSITIVE Sensitive     CEFEPIME <=0.12 SENSITIVE Sensitive     CEFTAZIDIME <=1 SENSITIVE Sensitive     CEFTRIAXONE <=0.25  SENSITIVE Sensitive     CIPROFLOXACIN <=0.25 SENSITIVE Sensitive     GENTAMICIN <=1 SENSITIVE Sensitive     IMIPENEM <=0.25 SENSITIVE Sensitive     TRIMETH/SULFA <=20 SENSITIVE Sensitive     AMPICILLIN/SULBACTAM <=2 SENSITIVE Sensitive     PIP/TAZO <=4 SENSITIVE Sensitive     * MODERATE ESCHERICHIA COLI         Radiology Studies: No results found.      Scheduled Meds:  chlorhexidine  15 mL Mouth Rinse BID   Chlorhexidine Gluconate Cloth  6 each Topical Daily   feeding supplement  237 mL Oral BID BM   insulin aspart  0-15 Units Subcutaneous TID WC   ipratropium-albuterol  3 mL Nebulization BID   lip balm  1 application  Topical BID   mouth rinse  15 mL Mouth Rinse q12n4p   metoprolol succinate  100 mg Oral Daily   pantoprazole  40 mg Oral BID   sodium chloride flush  10-40 mL Intracatheter Q12H   sodium chloride flush  3 mL Intravenous Q12H   Continuous Infusions:  sodium chloride 50 mL/hr at 11/15/21 1925   sodium chloride 250 mL (11/08/21 0653)   sodium chloride Stopped (11/09/21 1144)   sodium chloride 10 mL/hr at 11/15/21 1300   heparin 2,600 Units/hr (11/16/21 0000)   ondansetron (ZOFRAN) IV     piperacillin-tazobactam (ZOSYN)  IV 3.375 g (11/16/21 0343)     LOS: 9 days   Time spent= 35 mins    Robert Mabe Arsenio Loader, MD [facility]  If 7PM-7AM, please contact night-coverage  11/16/2021, 8:34 AM

## 2021-11-16 NOTE — Progress Notes (Signed)
   Subjective/Chief Complaint: Patient examined during breathing treatment - no new complaints Tolerating full liquids Had a couple of small bowel movements yesterday No nausea or vomiting   Objective: Vital signs in last 24 hours: Temp:  [97.7 F (36.5 C)-98.3 F (36.8 C)] 98.3 F (36.8 C) (06/13 0542) Pulse Rate:  [61-66] 63 (06/13 0542) Resp:  [17-19] 17 (06/13 0542) BP: (153-155)/(69-96) 155/77 (06/13 0542) SpO2:  [94 %-98 %] 95 % (06/13 0542) Last BM Date : 11/15/21  Intake/Output from previous day: 06/12 0701 - 06/13 0700 In: 2458.7 [P.O.:480; I.V.:1876.7; IV Piggyback:102.1] Out: 1550 [Urine:1550] Intake/Output this shift: No intake/output data recorded.  General: pleasant, WD, obese male Heart: regular, rate, and rhythm.   Lungs: normal effort  Abd: soft, less TTP along left flank and lower abdomen, no peritonitis or guarding, ND, TG drain with scant serous fluid  Psych: A&Ox3 with an appropriate affect.  Lab Results:  Recent Labs    11/15/21 0333 11/16/21 0610  WBC 13.0* 14.6*  HGB 12.1* 12.3*  HCT 38.0* 37.4*  PLT 206 243   BMET Recent Labs    11/14/21 0423 11/15/21 0333  NA 143 141  K 3.6 3.4*  CL 112* 110  CO2 24 22  GLUCOSE 112* 96  BUN 43* 35*  CREATININE 1.51* 1.32*  CALCIUM 8.1* 8.0*   PT/INR No results for input(s): "LABPROT", "INR" in the last 72 hours. ABG No results for input(s): "PHART", "HCO3" in the last 72 hours.  Invalid input(s): "PCO2", "PO2"  Studies/Results: No results found.  Anti-infectives: Anti-infectives (From admission, onward)    Start     Dose/Rate Route Frequency Ordered Stop   11/08/21 0730  vancomycin (VANCOREADY) IVPB 1750 mg/350 mL        1,750 mg 175 mL/hr over 120 Minutes Intravenous  Once 11/08/21 0641 11/08/21 0908   11/08/21 0641  vancomycin variable dose per unstable renal function (pharmacist dosing)  Status:  Discontinued         Does not apply See admin instructions 11/08/21 0641 11/08/21  1138   11/07/21 2000  piperacillin-tazobactam (ZOSYN) IVPB 3.375 g        3.375 g 12.5 mL/hr over 240 Minutes Intravenous Every 8 hours 11/07/21 1921     11/07/21 1430  piperacillin-tazobactam (ZOSYN) IVPB 3.375 g        3.375 g 100 mL/hr over 30 Minutes Intravenous  Once 11/07/21 1418 11/07/21 1535       Assessment/Plan: Multiple intraabdominal abscesses - likely diverticular - s/p IR drain placement 6/5 - drainage purulent, Cx with pan-sensitive E.coli  - repeat CT AP 6/9 - with resolution of large abscess but some persistent interloop collections  - have asked IR to remove transgluteal drain today - WBC slightly elevated to 14.6, afebrile, continue IV abx - Mild tenderness along left and lower abdomen, no peritonitis and abdomen is soft - soft diet - no indication for emergent surgical intervention  - will plan to repeat CT scan later this week to evaluate remaining fluid collections   FEN: soft, IVF per TRH VTE: SCDs, hep gtt ID: Zosyn 6/4>>   - per CCM -  A. Fib with RVR - improved, cards following  AKI - Cr 1.32, improving  Acute respiratory failure with hypoxia - improved OSA Morbid obesity - BMI 43.01   LOS: 9 days    Maia Petties 11/16/2021

## 2021-11-16 NOTE — Progress Notes (Signed)
Plan per surgery to remove transgluteal drain today if no OP. Pt had no OP in 24 hours. Right transgluteal drain removed intact, with no complications. Mild redness to exit site.  <5 cc EBL.  Dressing applied to exit site.   Unit secretary to notify RN that drain has been removed.   Post-removal instructions: - Okay  to shower/sponge bath 24 hours post-removal. - No submerging (swimming, bathing) for 7 days post-removal. - Keep the dressing/bandage on to take shower, take dressing/bandage off and pat dry  the area completely before placing a new dressing/bandage  - Look for signs and symptoms of infection such as reddening of skin, pus like drainage,     fever and/or chills - Change dressing PRN until site fully healed.    Patient verbalized understanding.    Please call IR with questions/concerns    Narda Rutherford, AGNP-BC 11/16/2021, 4:59 PM

## 2021-11-16 NOTE — Progress Notes (Signed)
PT declined cpap for the night.

## 2021-11-17 ENCOUNTER — Inpatient Hospital Stay (HOSPITAL_COMMUNITY): Payer: Commercial Managed Care - HMO

## 2021-11-17 DIAGNOSIS — K651 Peritoneal abscess: Secondary | ICD-10-CM | POA: Diagnosis not present

## 2021-11-17 LAB — CBC
HCT: 40.3 % (ref 39.0–52.0)
Hemoglobin: 13.3 g/dL (ref 13.0–17.0)
MCH: 32.3 pg (ref 26.0–34.0)
MCHC: 33 g/dL (ref 30.0–36.0)
MCV: 97.8 fL (ref 80.0–100.0)
Platelets: 183 10*3/uL (ref 150–400)
RBC: 4.12 MIL/uL — ABNORMAL LOW (ref 4.22–5.81)
RDW: 14.3 % (ref 11.5–15.5)
WBC: 13 10*3/uL — ABNORMAL HIGH (ref 4.0–10.5)
nRBC: 0 % (ref 0.0–0.2)

## 2021-11-17 LAB — COMPREHENSIVE METABOLIC PANEL
ALT: 37 U/L (ref 0–44)
AST: 42 U/L — ABNORMAL HIGH (ref 15–41)
Albumin: 2.1 g/dL — ABNORMAL LOW (ref 3.5–5.0)
Alkaline Phosphatase: 51 U/L (ref 38–126)
Anion gap: 8 (ref 5–15)
BUN: 28 mg/dL — ABNORMAL HIGH (ref 8–23)
CO2: 19 mmol/L — ABNORMAL LOW (ref 22–32)
Calcium: 8 mg/dL — ABNORMAL LOW (ref 8.9–10.3)
Chloride: 108 mmol/L (ref 98–111)
Creatinine, Ser: 1.34 mg/dL — ABNORMAL HIGH (ref 0.61–1.24)
GFR, Estimated: 59 mL/min — ABNORMAL LOW (ref >60)
Glucose, Bld: 87 mg/dL (ref 70–99)
Potassium: 4.2 mmol/L (ref 3.5–5.1)
Sodium: 135 mmol/L (ref 135–145)
Total Bilirubin: 1.2 mg/dL (ref 0.3–1.2)
Total Protein: 6.5 g/dL (ref 6.5–8.1)

## 2021-11-17 LAB — GLUCOSE, CAPILLARY
Glucose-Capillary: 102 mg/dL — ABNORMAL HIGH (ref 70–99)
Glucose-Capillary: 103 mg/dL — ABNORMAL HIGH (ref 70–99)
Glucose-Capillary: 71 mg/dL (ref 70–99)
Glucose-Capillary: 83 mg/dL (ref 70–99)
Glucose-Capillary: 86 mg/dL (ref 70–99)
Glucose-Capillary: 95 mg/dL (ref 70–99)

## 2021-11-17 LAB — MAGNESIUM: Magnesium: 1.9 mg/dL (ref 1.7–2.4)

## 2021-11-17 LAB — HEPARIN LEVEL (UNFRACTIONATED): Heparin Unfractionated: 0.56 IU/mL (ref 0.30–0.70)

## 2021-11-17 MED ORDER — IOHEXOL 9 MG/ML PO SOLN
ORAL | Status: AC
  Filled 2021-11-17: qty 1000

## 2021-11-17 MED ORDER — IOHEXOL 9 MG/ML PO SOLN
500.0000 mL | ORAL | Status: AC
  Administered 2021-11-17 (×2): 500 mL via ORAL

## 2021-11-17 NOTE — Progress Notes (Signed)
PT Cancellation Note  Patient Details Name: Robert Larson MRN: [id number] DOB: November 06, 1955   Cancelled Treatment:     Pt unable to tolerate after drinking "all that contrast" for ABD CT.  Pt asked that we "come back tomorrow".    Rica Koyanagi  PTA Acute  Rehabilitation Services Pager      [phone] Office      [phone]

## 2021-11-17 NOTE — Progress Notes (Signed)
Referring Physician(s): Tsuei,M  Supervising Physician: Markus Daft  Patient Status:  [facility] - In-pt  Chief Complaint:  Abdominal fluid collections  Subjective: Pt still with intermittent abd discomfort, occ loose stool; denies fever, N/V   Allergies: Patient has no known allergies.  Medications: Prior to Admission medications   Medication Sig Start Date End Date Taking? Authorizing Provider  FARXIGA 10 MG TABS tablet Take 10 mg by mouth daily.   Yes [provider]  metoprolol succinate (TOPROL-XL) 25 MG 24 hr tablet Take 1 tablet (25 mg total) by mouth at bedtime. START with 1/2 pill for one week, then increase to 1 full pill. Patient taking differently: Take 25 mg by mouth at bedtime. 10/18/21  Yes Jeanie Sewer, NP  dapagliflozin propanediol (FARXIGA) 5 MG TABS tablet Take 1 tablet (5 mg total) by mouth daily. Patient not taking: Reported on 11/07/2021 10/14/21   Jeanie Sewer, NP  Dulaglutide (TRULICITY) 1.5 FW/2.6VZ SOPN Inject 1.5 mg into the skin once a week. Start after you run out of the 0.'75mg'$  dose x 2 each week. Patient not taking: Reported on 11/07/2021 10/14/21   Jeanie Sewer, NP  metFORMIN (GLUCOPHAGE-XR) 500 MG 24 hr tablet TAKE 1 TABLET BY MOUTH EVERY DAY WITH BREAKFAST Patient not taking: Reported on 11/07/2021 10/14/21   Jeanie Sewer, NP  Omega-3 Fatty Acids (FISH OIL PO) Take 2 capsules by mouth daily. Patient not taking: Reported on 11/07/2021    [provider]  telmisartan-hydrochlorothiazide (MICARDIS HCT) 80-25 MG tablet Take 1 tablet by mouth daily. Patient not taking: Reported on 11/07/2021    [provider]  varenicline (CHANTIX) 0.5 MG tablet INCREASE TO 2 TABS IN AM, 1 TAB IN PM FOR 1-2 WEEKS, THEN 2 TABS AM AND 2 TABS PM. Patient not taking: Reported on 11/07/2021 10/08/21   Jeanie Sewer, NP     Vital Signs: BP (!) 153/69 (BP Location: Right Arm)   Pulse (!) 55   Temp 98.6 F (37 C) (Oral)   Resp 20    Ht 5' 6.93" (1.7 m)   Wt 279 lb 8.7 oz (126.8 kg)   SpO2 98%   BMI 43.88 kg/m   Physical Exam awake/alert; chest- sl dim BS bases; heart- RRR; abd- soft, mild dist, +BS, currently NT; no sig LE edema  Imaging: CT ABDOMEN PELVIS WO CONTRAST  Result Date: 11/17/2021 CLINICAL DATA:  Follow-up complicated diverticular abscesses. Recently patient had pelvic catheter removed. EXAM: CT ABDOMEN AND PELVIS WITHOUT CONTRAST TECHNIQUE: Multidetector CT imaging of the abdomen and pelvis was performed following the standard protocol without IV contrast. RADIATION DOSE REDUCTION: This exam was performed according to the departmental dose-optimization program which includes automated exposure control, adjustment of the mA and/or kV according to patient size and/or use of iterative reconstruction technique. COMPARISON:  Prior CT scan 11/12/2021 FINDINGS: Lower chest: Persistent small pleural effusions and bibasilar atelectasis. Hepatobiliary: Stable cirrhotic changes involving liver no hepatic lesions are identified. Evidence of portal venous hypertension and portal venous collaterals. Layering high attenuation material in the gallbladder is likely sludge. No findings suspicious for acute cholecystitis. No common bile duct dilatation. Pancreas: No mass, inflammation or ductal dilatation. Spleen: Stable splenomegaly. Adrenals/Urinary Tract: Stable bilateral adrenal gland adenomas. Stable simple and hemorrhagic renal cysts. The bladder is unremarkable. Stomach/Bowel: The stomach duodenum, small and colon are grossly normal. There is contrast in the distal ileum and also throughout the colon. I do not see any leaking contrast. Vascular/Lymphatic: Stable vascular calcifications and scattered mesenteric and retroperitoneal  lymph nodes. Reproductive: The prostate gland is mildly enlarged but stable. Other: There is a recurrent abscess in the central low pelvis between the bladder and rectum. It measures approximately 9.4 x 6.6  cm. There is a loculated fluid collection in the left pericolic gutter region lateral to the descending colon which is slightly larger when compared to the prior CT scan. It does not contain any gas. It measures approximately 7.8 x 3.2 cm on coronal image number 60. Difficult to exclude other central pelvic abscesses versus dilated small bowel loops with air-fluid levels. No free air or leaking oral contrast is demonstrated. Musculoskeletal: No significant bony findings. IMPRESSION: 1. Recurrent central low pelvic abscess measuring approximately 9.4 x 6.6 cm. 2. Slightly larger loculated fluid collection in the left paracolic gutter region lateral to the descending colon. Difficult to exclude other central pelvic abscesses versus dilated small bowel loops with air-fluid levels. 3. No free air or leaking oral contrast is demonstrated. 4. Stable cirrhotic changes involving liver with portal venous hypertension, portal venous collaterals and splenomegaly. 5. Stable bilateral adrenal gland adenomas. 6. Stable simple and hemorrhagic renal cysts. 7. Persistent small pleural effusions and bibasilar atelectasis. These results will be called to the ordering clinician or representative by the Radiologist Assistant, and communication documented in the PACS or [employer]. Electronically Signed   By: Marijo Sanes M.D.   On: 11/17/2021 15:26    Labs:  CBC: Recent Labs    11/14/21 0423 11/15/21 0333 11/16/21 0610 11/17/21 0427  WBC 15.2* 13.0* 14.6* 13.0*  HGB 13.3 12.1* 12.3* 13.3  HCT 41.2 38.0* 37.4* 40.3  PLT 200 206 243 183    COAGS: Recent Labs    11/08/21 0822 11/09/21 0616 11/12/21 1327  INR 1.6* 2.1* 1.3*    BMP: Recent Labs    11/14/21 0423 11/15/21 0333 11/16/21 0610 11/17/21 0427  NA 143 141 140 135  K 3.6 3.4* 3.5 4.2  CL 112* 110 110 108  CO2 '24 22 23 '$ 19*  GLUCOSE 112* 96 94 87  BUN 43* 35* 34* 28*  CALCIUM 8.1* 8.0* 8.0* 8.0*  CREATININE 1.51* 1.32* 1.36* 1.34*   GFRNONAA 51* >60 58* 59*    LIVER FUNCTION TESTS: Recent Labs    11/14/21 0423 11/15/21 0333 11/16/21 0610 11/17/21 0427  BILITOT 1.1 1.1 0.9 1.2  AST 71* 66* 46* 42*  ALT 54* 53* 45* 37  ALKPHOS 48 43 48 51  PROT 6.8 6.3* 6.4* 6.5  ALBUMIN 2.1* 2.0* 2.0* 2.1*    Assessment and Plan: Pt with hx of septic shock, multiple intraabdominal/pelvic abscesses, DM,HTN, cirrhosis by imaging, PVH, CKD; s/p right TG/pelvic drain placed on 6/5 (12 fr to bag); prior drain fluid culture with E coli;  drain removed yesterday due to resolution of collection/minimal output; afebrile; WBC 13, hgb 13.3, creat 1.34; on IV heparin for afib; f/u CT A/P today revealed:  1. Recurrent central low pelvic abscess measuring approximately 9.4 x 6.6 cm. 2. Slightly larger loculated fluid collection in the left paracolic gutter region lateral to the descending colon. Difficult to exclude other central pelvic abscesses versus dilated small bowel loops with air-fluid levels. 3. No free air or leaking oral contrast is demonstrated. 4. Stable cirrhotic changes involving liver with portal venous hypertension, portal venous collaterals and splenomegaly. 5. Stable bilateral adrenal gland adenomas. 6. Stable simple and hemorrhagic renal cysts. 7. Persistent small pleural effusions and bibasilar atelectasis  Request received for image guided abd/pelvic fluid collection drains.imaging reviewed by Drs. Henn/McCullough;  Risks and benefits discussed with the patient/daughter including bleeding, infection, damage to adjacent structures, bowel perforation/fistula connection, and sepsis.  All of the patient's questions were answered, patient is agreeable to proceed. Consent signed and in chart.Procedure scheduled for 6/15.    Electronically Signed: D. Rowe Robert, PA-C 11/17/2021, 8:30 PM   I spent a total of 20 minutes  at the the patient's bedside AND on the patient's hospital floor or unit, greater than 50% of  which was counseling/coordinating care for image guided abdominal/pelvic fluid collection drains    Patient ID: Robert Larson, male   DOB: 24-Jul-1955, 66 y.o.   MRN: [id number]

## 2021-11-17 NOTE — Progress Notes (Signed)
[place] for IV heparin Indication: atrial fibrillation  No Known Allergies  Patient Measurements: Height: 5' 6.93" (170 cm) Weight: 126.8 kg (279 lb 8.7 oz) IBW/kg (Calculated) : 65.94 Heparin Dosing Weight: 95 kg  Assessment: 37 yoM with PMH DM2, HTN, CKD, admitted for intra-abdominal abscess; IR drain placed but otherwise conservative management per Surgery. Developed new Afib overnight 6/7, and Pharmacy consulted to dose IV heparin  Today, 11/17/2021: Heparin level remains therapeutic on 2600 units/hr  CBC stable No complications of therapy noted  Goal of Therapy: Heparin level 0.3-0.7 units/ml Monitor platelets by anticoagulation protocol: Yes  Plan: Continue Heparin gtt at 2600 units/hr  Daily CBC and heparin level Monitor for signs of bleeding or thrombosis CCS plans to repeat CT scan later this week to evaluate remaining fluid collections F/u plans for transition to [place] once no more invasive procedures planned  Eudelia Bunch, Pharm.D 11/17/2021 8:36 AM

## 2021-11-17 NOTE — Progress Notes (Signed)
Progress Note     Subjective: Pt reports abdominal pain is improving. Having small loose BMs still. Re-confirmed last colonoscopy was ~3 years ago. He has not had any known episodes of diverticulitis prior to this.   Objective: Vital signs in last 24 hours: Temp:  [98.3 F (36.8 C)-98.6 F (37 C)] 98.6 F (37 C) (06/14 0411) Pulse Rate:  [57-58] 58 (06/14 0411) Resp:  [16-18] 18 (06/14 0411) BP: (136-152)/(62-81) 136/77 (06/14 0411) SpO2:  [95 %-98 %] 98 % (06/14 0411) Weight:  [126.8 kg] 126.8 kg (06/13 2341) Last BM Date : 11/15/21  Intake/Output from previous day: 06/13 0701 - 06/14 0700 In: 1832 [P.O.:720; I.V.:912; IV Piggyback:200] Out: 1000 [Urine:1000] Intake/Output this shift: No intake/output data recorded.  PE: General: pleasant, WD, obese male Heart: regular, rate, and rhythm.   Lungs: normal effort  Abd: soft, less TTP along left flank and lower abdomen, no peritonitis or guarding, ND Psych: A&Ox3 with an appropriate affect.    Lab Results:  Recent Labs    11/16/21 0610 11/17/21 0427  WBC 14.6* 13.0*  HGB 12.3* 13.3  HCT 37.4* 40.3  PLT 243 183   BMET Recent Labs    11/16/21 0610 11/17/21 0427  NA 140 135  K 3.5 4.2  CL 110 108  CO2 23 19*  GLUCOSE 94 87  BUN 34* 28*  CREATININE 1.36* 1.34*  CALCIUM 8.0* 8.0*   PT/INR No results for input(s): "LABPROT", "INR" in the last 72 hours. CMP     Component Value Date/Time   NA 135 11/17/2021 0427   NA 133 (A) 06/24/2021 0000   K 4.2 11/17/2021 0427   CL 108 11/17/2021 0427   CO2 19 (L) 11/17/2021 0427   GLUCOSE 87 11/17/2021 0427   BUN 28 (H) 11/17/2021 0427   BUN 18 06/24/2021 0000   CREATININE 1.34 (H) 11/17/2021 0427   CALCIUM 8.0 (L) 11/17/2021 0427   PROT 6.5 11/17/2021 0427   ALBUMIN 2.1 (L) 11/17/2021 0427   AST 42 (H) 11/17/2021 0427   ALT 37 11/17/2021 0427   ALKPHOS 51 11/17/2021 0427   BILITOT 1.2 11/17/2021 0427   GFRNONAA 59 (L) 11/17/2021 0427   GFRAA >60  02/19/2018 1123   Lipase     Component Value Date/Time   LIPASE 40 11/07/2021 1029       Studies/Results: No results found.  Anti-infectives: Anti-infectives (From admission, onward)    Start     Dose/Rate Route Frequency Ordered Stop   11/08/21 0730  vancomycin (VANCOREADY) IVPB 1750 mg/350 mL        1,750 mg 175 mL/hr over 120 Minutes Intravenous  Once 11/08/21 0641 11/08/21 0908   11/08/21 0641  vancomycin variable dose per unstable renal function (pharmacist dosing)  Status:  Discontinued         Does not apply See admin instructions 11/08/21 0641 11/08/21 1138   11/07/21 2000  piperacillin-tazobactam (ZOSYN) IVPB 3.375 g        3.375 g 12.5 mL/hr over 240 Minutes Intravenous Every 8 hours 11/07/21 1921     11/07/21 1430  piperacillin-tazobactam (ZOSYN) IVPB 3.375 g        3.375 g 100 mL/hr over 30 Minutes Intravenous  Once 11/07/21 1418 11/07/21 1535        Assessment/Plan Multiple intraabdominal abscesses - likely diverticular - s/p IR drain placement 6/5 - drainage purulent, Cx with pan-sensitive E.coli  - repeat CT AP 6/9 - with resolution of large abscess but some persistent interloop  collections  - IR removed TG drain yesterday  - WBC 13 today from 14 yesterday, afebrile, pt clinically reports pain is improving  - repeat CT today to re-evaluate other collections, keep NPO for this    FEN: NPO, IVF '@50'$  cc/h VTE: SCDs, hep gtt ID: Zosyn 6/4>>   - per CCM -  A. Fib with RVR - resolved, lopressor  AKI - Cr 1.34, stable  Acute respiratory failure with hypoxia - improved OSA Morbid obesity - BMI 43.01    LOS: 10 days   I reviewed Consultant IR notes, hospitalist notes, last 24 h vitals and pain scores, last 48 h intake and output, and last 24 h labs and trends.     Norm Parcel, [facility] 11/17/2021, 8:39 AM Please see Amion for pager number during day hours 7:00am-4:30pm

## 2021-11-17 NOTE — Progress Notes (Signed)
Patient continues to decline nocturnal CPAP.  

## 2021-11-17 NOTE — TOC Progression Note (Signed)
Transition of Care [facility]) - Progression Note    Patient Details  Name: Robert Larson MRN: [id number] Date of Birth: 09/07/1955  [facility] CM/SW Contact  Leeroy Cha, RN Phone Number: 11/17/2021, 7:31 AM  Clinical Narrative:    Following for toc needs.  Chart reviewed none present.     Barriers to Discharge: Continued Medical Work up  Expected Discharge Plan and Services                                                 Social Determinants of Health (SDOH) Interventions    Readmission Risk Interventions     No data to display

## 2021-11-17 NOTE — Progress Notes (Signed)
PROGRESS NOTE    Robert Larson  YVO:[id number] DOB: [birth year] DOA: 11/07/2021 PCP: Jeanie Sewer, NP  Brief Narrative: 66 year old with history of DM2, HTN, CKD admitted to the hospital for severe sepsis due to intra-abdominal abscess.  CT abdomen pelvis showed multiple loculated fluid collection in the lower abdomen with a large cavity in the pelvis measuring 11.5 X6X 11.7 cm.  Due to septic shock he was transferred to the ICU on 6/5.  Patient was seen by IR and drain was placed.  Hospital course was also complicated by acute kidney injury, hypoxia and atrial fibrillation with RVR.  Repeat CT showed resolution of abscess or the drain is in place but does have other pockets of fluid collection.  Patient is also on Cardizem drip, slowly uptitrating beta-blocker and heparin drip.  Patient has slowly been transitioned to p.o.  Per general surgery continue IV antibiotics, IR to remove drain today and may be repeat CT later in the week.  Assessment & Plan:   Principal Problem:   Intra-abdominal abscesses s/p perc drainage 11/08/2021 Active Problems:   Essential hypertension   Type 2 diabetes mellitus with morbid obesity (HCC)   Diverticulosis   Hyponatremia   Acute renal failure superimposed on chronic kidney disease (HCC)   Sleep apnea   Osteoarthritis   CKD (chronic kidney disease) stage 3, GFR 30-59 ml/min (HCC)   Adrenal nodule (HCC)   History of adenomatous polyp of colon   Spinal stenosis of lumbar region   Renal cyst   Cholelithiasis   Coffee ground emesis   Septic shock (HCC)   Tobacco abuse   #1 multiple intra-abdominal abscess likely from Septic shock secondary to above Patient had IR drain placed which was removed on 11/16/2021. Repeat CT scan 11/17/2021 shows recurrent large abscess 6 x 9 cm. On Zosyn. Surgery following appreciate their input  #2 new onset A-fib with RVR normal TSH normal echo on metoprolol and heparin drip. We will start p.o. anticoagulation once  all surgical procedures are over.  #3 AKI on CKD stage IIIa resolved.  #4 transaminitis improving  #5 essential hypertension holding outpatient medications  #6 obstructive sleep apnea nocturnal CPAP ordered but he refuses most of the nights  #7 acute hypoxic respiratory distress resolved   Estimated body mass index is 43.88 kg/m as calculated from the following:   Height as of this encounter: 5' 6.93" (1.7 m).   Weight as of this encounter: 126.8 kg.  DVT prophylaxis: Heparin  code Status: Full code Family Communication: None  disposition Plan:  Status is: Inpatient Remains inpatient appropriate because: Recurrent intra-abdominal abscess   Consultants:  General surgery  Procedures: IR placement of intra-abdominal drain Antimicrobials: Zosyn  Subjective: He is resting in bed Repeat CT scan today shows recurrent abscess Still with leukocytosis  Objective: Vitals:   11/16/21 2341 11/17/21 0411 11/17/21 0950 11/17/21 1059  BP:  136/77  (!) 153/69  Pulse:  (!) 58  (!) 55  Resp:  18  20  Temp:  98.6 F (37 C)  98.6 F (37 C)  TempSrc:  Oral  Oral  SpO2:  98% 97% 98%  Weight: 126.8 kg     Height:        Intake/Output Summary (Last 24 hours) at 11/17/2021 1612 Last data filed at 11/17/2021 0500 Gross per 24 hour  Intake 1352 ml  Output 1000 ml  Net 352 ml   Filed Weights   11/13/21 0000 11/14/21 2334 11/16/21 2341  Weight: 119 kg 123.5 kg  126.8 kg    Examination:  General exam: Appears calm and comfortable  Respiratory system: Clear to auscultation. Respiratory effort normal. Cardiovascular system: S1 & S2 heard, RRR. No JVD, murmurs, rubs, gallops or clicks. No pedal edema. Gastrointestinal system: Abdomen is distended, soft and nontender. No organomegaly or masses felt. Normal bowel sounds heard. Central nervous system: Alert and oriented. No focal neurological deficits. Extremities: Symmetric 5 x 5 power. Skin: No rashes, lesions or ulcers Psychiatry:  Judgement and insight appear normal. Mood & affect appropriate.     Data Reviewed: I have personally reviewed following labs and imaging studies  CBC: Recent Labs  Lab 11/13/21 0404 11/14/21 0423 11/15/21 0333 11/16/21 0610 11/17/21 0427  WBC 16.7* 15.2* 13.0* 14.6* 13.0*  HGB 12.7* 13.3 12.1* 12.3* 13.3  HCT 40.0 41.2 38.0* 37.4* 40.3  MCV 99.3 99.0 98.7 98.4 97.8  PLT 181 200 206 243 751   Basic Metabolic Panel: Recent Labs  Lab 11/12/21 0407 11/13/21 0404 11/14/21 0423 11/15/21 0333 11/16/21 0610 11/17/21 0427  NA 149* 148* 143 141 140 135  K 3.7 3.7 3.6 3.4* 3.5 4.2  CL 116* 117* 112* 110 110 108  CO2 '25 26 24 22 23 '$ 19*  GLUCOSE 124* 112* 112* 96 94 87  BUN 56* 49* 43* 35* 34* 28*  CREATININE 1.54* 1.62* 1.51* 1.32* 1.36* 1.34*  CALCIUM 8.4* 8.1* 8.1* 8.0* 8.0* 8.0*  MG 2.3 1.9 2.1 1.8 1.8 1.9  PHOS 2.8  --   --   --   --   --    GFR: Estimated Creatinine Clearance: 71.1 mL/min (A) (by C-G formula based on SCr of 1.34 mg/dL (H)). Liver Function Tests: Recent Labs  Lab 11/13/21 0404 11/14/21 0423 11/15/21 0333 11/16/21 0610 11/17/21 0427  AST 68* 71* 66* 46* 42*  ALT 48* 54* 53* 45* 37  ALKPHOS 45 48 43 48 51  BILITOT 1.1 1.1 1.1 0.9 1.2  PROT 6.1* 6.8 6.3* 6.4* 6.5  ALBUMIN 1.9* 2.1* 2.0* 2.0* 2.1*   No results for input(s): "LIPASE", "AMYLASE" in the last 168 hours. No results for input(s): "AMMONIA" in the last 168 hours. Coagulation Profile: Recent Labs  Lab 11/12/21 1327  INR 1.3*   Cardiac Enzymes: No results for input(s): "CKTOTAL", "CKMB", "CKMBINDEX", "TROPONINI" in the last 168 hours. BNP (last 3 results) No results for input(s): "PROBNP" in the last 8760 hours. HbA1C: No results for input(s): "HGBA1C" in the last 72 hours. CBG: Recent Labs  Lab 11/16/21 2339 11/17/21 0404 11/17/21 0743 11/17/21 1139 11/17/21 1540  GLUCAP 98 83 95 86 71   Lipid Profile: No results for input(s): "CHOL", "HDL", "LDLCALC", "TRIG", "CHOLHDL",  "LDLDIRECT" in the last 72 hours. Thyroid Function Tests: No results for input(s): "TSH", "T4TOTAL", "FREET4", "T3FREE", "THYROIDAB" in the last 72 hours. Anemia Panel: No results for input(s): "VITAMINB12", "FOLATE", "FERRITIN", "TIBC", "IRON", "RETICCTPCT" in the last 72 hours. Sepsis Labs: No results for input(s): "PROCALCITON", "LATICACIDVEN" in the last 168 hours.  Recent Results (from the past 240 hour(s))  Surgical PCR screen     Status: None   Collection Time: 11/08/21  2:06 AM   Specimen: Nasal Mucosa; Nasal Swab  Result Value Ref Range Status   MRSA, PCR NEGATIVE NEGATIVE Final   Staphylococcus aureus NEGATIVE NEGATIVE Final    Comment: (NOTE) The Xpert SA Assay (FDA approved for NASAL specimens in patients 65 years of age and older), is one component of a comprehensive surveillance program. It is not intended to diagnose infection nor  to guide or monitor treatment. Performed at [facility], [place] [address] [place], [place] [address]   Aerobic/Anaerobic Culture w Gram Stain (surgical/deep wound)     Status: None   Collection Time: 11/08/21  1:25 PM   Specimen: Abscess  Result Value Ref Range Status   Specimen Description   Final    ABSCESS DEEP PELVIC DRAIN Performed at [facility] [address], [place], [place] [address]    Special Requests   Final    NONE Performed at [facility], [place] [address] [place], [place] [address]    Gram Stain   Final    RARE WBC PRESENT,BOTH PMN AND MONONUCLEAR ABUNDANT GRAM POSITIVE COCCI IN PAIRS MODERATE GRAM NEGATIVE RODS RARE GRAM POSITIVE RODS Performed at [facility], [place] [address], [place], [place] [address]    Culture   Final    MODERATE ESCHERICHIA COLI MIXED ANAEROBIC FLORA PRESENT.  CALL LAB IF FURTHER IID REQUIRED.    Report Status 11/11/2021 FINAL  Final   Organism ID, Bacteria ESCHERICHIA COLI  Final      Susceptibility   Escherichia coli -  MIC*    AMPICILLIN 4 SENSITIVE Sensitive     CEFAZOLIN <=4 SENSITIVE Sensitive     CEFEPIME <=0.12 SENSITIVE Sensitive     CEFTAZIDIME <=1 SENSITIVE Sensitive     CEFTRIAXONE <=0.25 SENSITIVE Sensitive     CIPROFLOXACIN <=0.25 SENSITIVE Sensitive     GENTAMICIN <=1 SENSITIVE Sensitive     IMIPENEM <=0.25 SENSITIVE Sensitive     TRIMETH/SULFA <=20 SENSITIVE Sensitive     AMPICILLIN/SULBACTAM <=2 SENSITIVE Sensitive     PIP/TAZO <=4 SENSITIVE Sensitive     * MODERATE ESCHERICHIA COLI         Radiology Studies: No results found.      Scheduled Meds:  chlorhexidine  15 mL Mouth Rinse BID   Chlorhexidine Gluconate Cloth  6 each Topical Daily   feeding supplement  237 mL Oral BID BM   insulin aspart  0-15 Units Subcutaneous TID WC   iohexol       lip balm  1 application  Topical BID   mouth rinse  15 mL Mouth Rinse q12n4p   metoprolol succinate  100 mg Oral Daily   pantoprazole  40 mg Oral BID   sodium chloride flush  10-40 mL Intracatheter Q12H   sodium chloride flush  3 mL Intravenous Q12H   Continuous Infusions:  sodium chloride 50 mL/hr at 11/16/21 1459   sodium chloride 250 mL (11/08/21 0653)   sodium chloride Stopped (11/09/21 1144)   sodium chloride 10 mL/hr at 11/15/21 1300   heparin 2,600 Units/hr (11/17/21 1448)   ondansetron (ZOFRAN) IV     piperacillin-tazobactam (ZOSYN)  IV 3.375 g (11/17/21 1219)     LOS: 10 days    Time spent: 35 minutes  Georgette Shell, MD  11/17/2021, 4:12 PM

## 2021-11-17 NOTE — Progress Notes (Addendum)
[facility] called with results of CT Abd/Pelvis showing recurrent central low pelvic abscess measuring 9X6 cm. MD made aware.

## 2021-11-18 ENCOUNTER — Inpatient Hospital Stay (HOSPITAL_COMMUNITY): Payer: Commercial Managed Care - HMO

## 2021-11-18 ENCOUNTER — Encounter (HOSPITAL_COMMUNITY): Payer: Self-pay | Admitting: Internal Medicine

## 2021-11-18 DIAGNOSIS — K651 Peritoneal abscess: Secondary | ICD-10-CM | POA: Diagnosis not present

## 2021-11-18 LAB — GLUCOSE, CAPILLARY
Glucose-Capillary: 100 mg/dL — ABNORMAL HIGH (ref 70–99)
Glucose-Capillary: 90 mg/dL (ref 70–99)
Glucose-Capillary: 101 mg/dL — ABNORMAL HIGH (ref 70–99)
Glucose-Capillary: 84 mg/dL (ref 70–99)
Glucose-Capillary: 131 mg/dL — ABNORMAL HIGH (ref 70–99)
Glucose-Capillary: 96 mg/dL (ref 70–99)

## 2021-11-18 LAB — CBC
HCT: 36.4 % — ABNORMAL LOW (ref 39.0–52.0)
Hemoglobin: 11.7 g/dL — ABNORMAL LOW (ref 13.0–17.0)
MCH: 31.8 pg (ref 26.0–34.0)
MCHC: 32.1 g/dL (ref 30.0–36.0)
MCV: 98.9 fL (ref 80.0–100.0)
Platelets: 268 10*3/uL (ref 150–400)
RBC: 3.68 MIL/uL — ABNORMAL LOW (ref 4.22–5.81)
RDW: 14.4 % (ref 11.5–15.5)
WBC: 14.2 10*3/uL — ABNORMAL HIGH (ref 4.0–10.5)
nRBC: 0 % (ref 0.0–0.2)

## 2021-11-18 LAB — BASIC METABOLIC PANEL
Anion gap: 6 (ref 5–15)
BUN: 22 mg/dL (ref 8–23)
CO2: 21 mmol/L — ABNORMAL LOW (ref 22–32)
Calcium: 7.9 mg/dL — ABNORMAL LOW (ref 8.9–10.3)
Chloride: 108 mmol/L (ref 98–111)
Creatinine, Ser: 1.4 mg/dL — ABNORMAL HIGH (ref 0.61–1.24)
GFR, Estimated: 56 mL/min — ABNORMAL LOW (ref >60)
Glucose, Bld: 92 mg/dL (ref 70–99)
Potassium: 3.6 mmol/L (ref 3.5–5.1)
Sodium: 135 mmol/L (ref 135–145)

## 2021-11-18 LAB — MAGNESIUM: Magnesium: 1.6 mg/dL — ABNORMAL LOW (ref 1.7–2.4)

## 2021-11-18 LAB — HEPARIN LEVEL (UNFRACTIONATED): Heparin Unfractionated: 0.39 IU/mL (ref 0.30–0.70)

## 2021-11-18 MED ORDER — FENTANYL CITRATE (PF) 100 MCG/2ML IJ SOLN
INTRAMUSCULAR | Status: AC | PRN
  Administered 2021-11-18: 50 ug via INTRAVENOUS

## 2021-11-18 MED ORDER — HEPARIN (PORCINE) 25000 UT/250ML-% IV SOLN
2650.0000 [IU]/h | INTRAVENOUS | Status: AC
  Administered 2021-11-18 – 2021-11-19 (×2): 2600 [IU]/h via INTRAVENOUS
  Administered 2021-11-19: 2650 [IU]/h via INTRAVENOUS
  Administered 2021-11-20: 2850 [IU]/h via INTRAVENOUS
  Administered 2021-11-20: 2650 [IU]/h via INTRAVENOUS
  Administered 2021-11-20: 2850 [IU]/h via INTRAVENOUS
  Administered 2021-11-21: 2800 [IU]/h via INTRAVENOUS
  Administered 2021-11-21: 2850 [IU]/h via INTRAVENOUS
  Administered 2021-11-22 (×2): 2750 [IU]/h via INTRAVENOUS
  Administered 2021-11-22: 2800 [IU]/h via INTRAVENOUS
  Administered 2021-11-23: 2750 [IU]/h via INTRAVENOUS
  Filled 2021-11-18 (×15): qty 250

## 2021-11-18 MED ORDER — MIDAZOLAM HCL 2 MG/2ML IJ SOLN
INTRAMUSCULAR | Status: AC | PRN
  Administered 2021-11-18: 1 mg via INTRAVENOUS

## 2021-11-18 MED ORDER — MIDAZOLAM HCL 2 MG/2ML IJ SOLN
INTRAMUSCULAR | Status: AC
  Filled 2021-11-18: qty 4

## 2021-11-18 MED ORDER — MAGNESIUM SULFATE 2 GM/50ML IV SOLN
2.0000 g | Freq: Once | INTRAVENOUS | Status: AC
  Administered 2021-11-18: 2 g via INTRAVENOUS
  Filled 2021-11-18: qty 50

## 2021-11-18 MED ORDER — FENTANYL CITRATE (PF) 100 MCG/2ML IJ SOLN
INTRAMUSCULAR | Status: AC
  Filled 2021-11-18: qty 4

## 2021-11-18 MED ORDER — AMLODIPINE BESYLATE 5 MG PO TABS
5.0000 mg | ORAL_TABLET | Freq: Every day | ORAL | Status: DC
  Administered 2021-11-18 – 2021-11-26 (×8): 5 mg via ORAL
  Filled 2021-11-18 (×8): qty 1

## 2021-11-18 MED ORDER — METOPROLOL SUCCINATE ER 25 MG PO TB24
50.0000 mg | ORAL_TABLET | Freq: Every day | ORAL | Status: DC
  Administered 2021-11-21 – 2021-11-26 (×6): 50 mg via ORAL
  Filled 2021-11-18 (×6): qty 1

## 2021-11-18 MED ORDER — SODIUM CHLORIDE 0.9% FLUSH
5.0000 mL | Freq: Three times a day (TID) | INTRAVENOUS | Status: DC
  Administered 2021-11-18 – 2021-11-23 (×13): 5 mL

## 2021-11-18 NOTE — Progress Notes (Signed)
Continues to decline nocturnal CPAP. Order changed to prn 

## 2021-11-18 NOTE — Progress Notes (Signed)
PT Cancellation Note  Patient Details Name: Robert Larson MRN: [id number] DOB: February 25, 1956   Cancelled Treatment:     pt out of room for placement of ABD drains.     Nathanial Rancher 11/18/2021, 3:44 PM

## 2021-11-18 NOTE — Progress Notes (Signed)
PROGRESS NOTE    Robert Larson  KPT:[id number] DOB: 02-10-1956 DOA: 11/07/2021 PCP: Jeanie Sewer, NP  Brief Narrative: 66 year old with history of DM2, HTN, CKD admitted to the hospital for severe sepsis due to intra-abdominal abscess.  CT abdomen pelvis showed multiple loculated fluid collection in the lower abdomen with a large cavity in the pelvis measuring 11.5 X6X 11.7 cm.  Due to septic shock he was transferred to the ICU on 6/5.  Patient was seen by IR and drain was placed.  Hospital course was also complicated by acute kidney injury, hypoxia and atrial fibrillation with RVR.  Repeat CT showed resolution of abscess or the drain is in place but does have other pockets of fluid collection.  Patient is also on Cardizem drip, slowly uptitrating beta-blocker and heparin drip.  Patient has slowly been transitioned to p.o.  Per general surgery continue IV antibiotics, IR to remove drain today and may be repeat CT later in the week.  Assessment & Plan:   Principal Problem:   Intra-abdominal abscesses s/p perc drainage 11/08/2021 Active Problems:   Essential hypertension   Type 2 diabetes mellitus with morbid obesity (HCC)   Diverticulosis   Hyponatremia   Acute renal failure superimposed on chronic kidney disease (HCC)   Sleep apnea   Osteoarthritis   CKD (chronic kidney disease) stage 3, GFR 30-59 ml/min (HCC)   Adrenal nodule (HCC)   History of adenomatous polyp of colon   Spinal stenosis of lumbar region   Renal cyst   Cholelithiasis   Coffee ground emesis   Septic shock (HCC)   Tobacco abuse   #1 multiple intra-abdominal abscess likely from diverticular micro perforation Septic shock secondary to above Patient had IR drain placed which was removed on 11/16/2021. Repeat CT scan 11/17/2021 shows recurrent large abscess 6 x 9 cm. On Zosyn. Patient to have IR drain placed today 11/18/2021 Surgery following appreciate their input  #2 new onset A-fib with RVR normal TSH normal  echo on metoprolol and heparin drip.  Decrease the dose of metoprolol due to bradycardia We will start p.o. anticoagulation once all surgical procedures are over.  #3 AKI on CKD stage IIIa resolved.  #4 transaminitis improving  #5 essential hypertension holding outpatient medications  #6 obstructive sleep apnea nocturnal CPAP ordered but he refuses most of the nights  #7 acute hypoxic respiratory distress resolved   Estimated body mass index is 43.88 kg/m as calculated from the following:   Height as of this encounter: 5' 6.93" (1.7 m).   Weight as of this encounter: 126.8 kg.  DVT prophylaxis: Heparin  code Status: Full code Family Communication: None  disposition Plan:  Status is: Inpatient Remains inpatient appropriate because: Recurrent intra-abdominal abscess   Consultants:  General surgery  Procedures: IR placement of intra-abdominal drain Antimicrobials: Zosyn  Subjective:   No events overnight patient resting in bed in no acute distress he had soft diet yesterday denies any increasing abdominal pain or nausea Objective: Vitals:   11/17/21 1059 11/17/21 2104 11/18/21 0440 11/18/21 1239  BP: (!) 153/69 (!) 149/82 (!) 156/77 (!) 157/68  Pulse: (!) 55 64 64 (!) 46  Resp: '20 18 18 18  '$ Temp: 98.6 F (37 C) 98.4 F (36.9 C) 97.8 F (36.6 C) 98.5 F (36.9 C)  TempSrc: Oral Oral  Oral  SpO2: 98% 96% 95% 97%  Weight:      Height:        Intake/Output Summary (Last 24 hours) at 11/18/2021 1303 Last data filed  at 11/18/2021 0900 Gross per 24 hour  Intake 1472 ml  Output 1150 ml  Net 322 ml    Filed Weights   11/13/21 0000 11/14/21 2334 11/16/21 2341  Weight: 119 kg 123.5 kg 126.8 kg    Examination:  General exam: Appears calm and comfortable  Respiratory system: Clear to auscultation. Respiratory effort normal. Cardiovascular system: S1 & S2 heard, RRR. No JVD, murmurs, rubs, gallops or clicks. No pedal edema. Gastrointestinal system: Abdomen is  distended, soft and nontender. No organomegaly or masses felt. Normal bowel sounds heard. Central nervous system: Alert and oriented. No focal neurological deficits. Extremities: Symmetric 5 x 5 power. Skin: No rashes, lesions or ulcers Psychiatry: Judgement and insight appear normal. Mood & affect appropriate.     Data Reviewed: I have personally reviewed following labs and imaging studies  CBC: Recent Labs  Lab 11/14/21 0423 11/15/21 0333 11/16/21 0610 11/17/21 0427 11/18/21 0438  WBC 15.2* 13.0* 14.6* 13.0* 14.2*  HGB 13.3 12.1* 12.3* 13.3 11.7*  HCT 41.2 38.0* 37.4* 40.3 36.4*  MCV 99.0 98.7 98.4 97.8 98.9  PLT 200 206 243 183 151    Basic Metabolic Panel: Recent Labs  Lab 11/12/21 0407 11/13/21 0404 11/14/21 0423 11/15/21 0333 11/16/21 0610 11/17/21 0427 11/18/21 0438 11/18/21 0832  NA 149*   < > 143 141 140 135  --  135  K 3.7   < > 3.6 3.4* 3.5 4.2  --  3.6  CL 116*   < > 112* 110 110 108  --  108  CO2 25   < > '24 22 23 '$ 19*  --  21*  GLUCOSE 124*   < > 112* 96 94 87  --  92  BUN 56*   < > 43* 35* 34* 28*  --  22  CREATININE 1.54*   < > 1.51* 1.32* 1.36* 1.34*  --  1.40*  CALCIUM 8.4*   < > 8.1* 8.0* 8.0* 8.0*  --  7.9*  MG 2.3   < > 2.1 1.8 1.8 1.9 1.6*  --   PHOS 2.8  --   --   --   --   --   --   --    < > = values in this interval not displayed.    GFR: Estimated Creatinine Clearance: 68.1 mL/min (A) (by C-G formula based on SCr of 1.4 mg/dL (H)). Liver Function Tests: Recent Labs  Lab 11/13/21 0404 11/14/21 0423 11/15/21 0333 11/16/21 0610 11/17/21 0427  AST 68* 71* 66* 46* 42*  ALT 48* 54* 53* 45* 37  ALKPHOS 45 48 43 48 51  BILITOT 1.1 1.1 1.1 0.9 1.2  PROT 6.1* 6.8 6.3* 6.4* 6.5  ALBUMIN 1.9* 2.1* 2.0* 2.0* 2.1*    No results for input(s): "LIPASE", "AMYLASE" in the last 168 hours. No results for input(s): "AMMONIA" in the last 168 hours. Coagulation Profile: Recent Labs  Lab 11/12/21 1327  INR 1.3*    Cardiac Enzymes: No  results for input(s): "CKTOTAL", "CKMB", "CKMBINDEX", "TROPONINI" in the last 168 hours. BNP (last 3 results) No results for input(s): "PROBNP" in the last 8760 hours. HbA1C: No results for input(s): "HGBA1C" in the last 72 hours. CBG: Recent Labs  Lab 11/17/21 1940 11/17/21 2344 11/18/21 0435 11/18/21 0744 11/18/21 1136  GLUCAP 102* 103* 100* 90 101*    Lipid Profile: No results for input(s): "CHOL", "HDL", "LDLCALC", "TRIG", "CHOLHDL", "LDLDIRECT" in the last 72 hours. Thyroid Function Tests: No results for input(s): "TSH", "T4TOTAL", "FREET4", "T3FREE", "  THYROIDAB" in the last 72 hours. Anemia Panel: No results for input(s): "VITAMINB12", "FOLATE", "FERRITIN", "TIBC", "IRON", "RETICCTPCT" in the last 72 hours. Sepsis Labs: No results for input(s): "PROCALCITON", "LATICACIDVEN" in the last 168 hours.  Recent Results (from the past 240 hour(s))  Aerobic/Anaerobic Culture w Gram Stain (surgical/deep wound)     Status: None   Collection Time: 11/08/21  1:25 PM   Specimen: Abscess  Result Value Ref Range Status   Specimen Description   Final    ABSCESS DEEP PELVIC DRAIN Performed at [facility] [address] [place], [place] [address]    Special Requests   Final    NONE Performed at [facility], [place] [address], [place], [place] [address]    Gram Stain   Final    RARE WBC PRESENT,BOTH PMN AND MONONUCLEAR ABUNDANT GRAM POSITIVE COCCI IN PAIRS MODERATE GRAM NEGATIVE RODS RARE GRAM POSITIVE RODS Performed at [facility], [place] [address], [place], [place] [address]    Culture   Final    MODERATE ESCHERICHIA COLI MIXED ANAEROBIC FLORA PRESENT.  CALL LAB IF FURTHER IID REQUIRED.    Report Status 11/11/2021 FINAL  Final   Organism ID, Bacteria ESCHERICHIA COLI  Final      Susceptibility   Escherichia coli - MIC*    AMPICILLIN 4 SENSITIVE Sensitive     CEFAZOLIN <=4 SENSITIVE Sensitive     CEFEPIME <=0.12 SENSITIVE Sensitive      CEFTAZIDIME <=1 SENSITIVE Sensitive     CEFTRIAXONE <=0.25 SENSITIVE Sensitive     CIPROFLOXACIN <=0.25 SENSITIVE Sensitive     GENTAMICIN <=1 SENSITIVE Sensitive     IMIPENEM <=0.25 SENSITIVE Sensitive     TRIMETH/SULFA <=20 SENSITIVE Sensitive     AMPICILLIN/SULBACTAM <=2 SENSITIVE Sensitive     PIP/TAZO <=4 SENSITIVE Sensitive     * MODERATE ESCHERICHIA COLI         Radiology Studies: CT ABDOMEN PELVIS WO CONTRAST  Result Date: 11/17/2021 CLINICAL DATA:  Follow-up complicated diverticular abscesses. Recently patient had pelvic catheter removed. EXAM: CT ABDOMEN AND PELVIS WITHOUT CONTRAST TECHNIQUE: Multidetector CT imaging of the abdomen and pelvis was performed following the standard protocol without IV contrast. RADIATION DOSE REDUCTION: This exam was performed according to the departmental dose-optimization program which includes automated exposure control, adjustment of the mA and/or kV according to patient size and/or use of iterative reconstruction technique. COMPARISON:  Prior CT scan 11/12/2021 FINDINGS: Lower chest: Persistent small pleural effusions and bibasilar atelectasis. Hepatobiliary: Stable cirrhotic changes involving liver no hepatic lesions are identified. Evidence of portal venous hypertension and portal venous collaterals. Layering high attenuation material in the gallbladder is likely sludge. No findings suspicious for acute cholecystitis. No common bile duct dilatation. Pancreas: No mass, inflammation or ductal dilatation. Spleen: Stable splenomegaly. Adrenals/Urinary Tract: Stable bilateral adrenal gland adenomas. Stable simple and hemorrhagic renal cysts. The bladder is unremarkable. Stomach/Bowel: The stomach duodenum, small and colon are grossly normal. There is contrast in the distal ileum and also throughout the colon. I do not see any leaking contrast. Vascular/Lymphatic: Stable vascular calcifications and scattered mesenteric and retroperitoneal lymph nodes.  Reproductive: The prostate gland is mildly enlarged but stable. Other: There is a recurrent abscess in the central low pelvis between the bladder and rectum. It measures approximately 9.4 x 6.6 cm. There is a loculated fluid collection in the left pericolic gutter region lateral to the descending colon which is slightly larger when compared to the prior CT scan. It does  not contain any gas. It measures approximately 7.8 x 3.2 cm on coronal image number 60. Difficult to exclude other central pelvic abscesses versus dilated small bowel loops with air-fluid levels. No free air or leaking oral contrast is demonstrated. Musculoskeletal: No significant bony findings. IMPRESSION: 1. Recurrent central low pelvic abscess measuring approximately 9.4 x 6.6 cm. 2. Slightly larger loculated fluid collection in the left paracolic gutter region lateral to the descending colon. Difficult to exclude other central pelvic abscesses versus dilated small bowel loops with air-fluid levels. 3. No free air or leaking oral contrast is demonstrated. 4. Stable cirrhotic changes involving liver with portal venous hypertension, portal venous collaterals and splenomegaly. 5. Stable bilateral adrenal gland adenomas. 6. Stable simple and hemorrhagic renal cysts. 7. Persistent small pleural effusions and bibasilar atelectasis. These results will be called to the ordering clinician or representative by the Radiologist Assistant, and communication documented in the PACS or [employer]. Electronically Signed   By: Marijo Sanes M.D.   On: 11/17/2021 15:26        Scheduled Meds:  amLODipine  5 mg Oral Daily   chlorhexidine  15 mL Mouth Rinse BID   Chlorhexidine Gluconate Cloth  6 each Topical Daily   feeding supplement  237 mL Oral BID BM   insulin aspart  0-15 Units Subcutaneous TID WC   lip balm  1 application  Topical BID   mouth rinse  15 mL Mouth Rinse q12n4p   [START ON 11/19/2021] metoprolol succinate  50 mg Oral Daily    pantoprazole  40 mg Oral BID   sodium chloride flush  10-40 mL Intracatheter Q12H   sodium chloride flush  3 mL Intravenous Q12H   Continuous Infusions:  sodium chloride 50 mL/hr at 11/18/21 0507   sodium chloride 250 mL (11/08/21 0653)   sodium chloride Stopped (11/09/21 1144)   sodium chloride 10 mL/hr at 11/15/21 1300   heparin Stopped (11/18/21 1010)   ondansetron (ZOFRAN) IV     piperacillin-tazobactam (ZOSYN)  IV 3.375 g (11/18/21 1203)     LOS: 11 days    Time spent: 35 minutes  Georgette Shell, MD  11/18/2021, 1:03 PM

## 2021-11-18 NOTE — Procedures (Signed)
Interventional Radiology Procedure:   Indications: Abdominal abscesses, likely secondary to diverticulitis  Procedure: CT guided placement of drain in RLQ abscess and CT guided placement of drain in left lateral abdominal abscess  Findings: 12 Fr drain placed in right lower abdominal abscess.  Removed 80 ml of brown purulent fluid.  This drain appeared to be decompressing the pelvic collection as well.  Therefore, a new transgluteal drain was not placed.    10 Fr drain placed in left lateral abdominal abscess and removed 30 ml of cloudy yellow fluid.   Complications: No immediate complications noted.     EBL: Minimal  Plan: Send fluid for culture and follow drain outputs.  Patient will need follow up CT imaging to evaluate drains/abscesses.   Stanlee Roehrig R. Anselm Pancoast, MD  Pager: [phone]

## 2021-11-18 NOTE — Progress Notes (Signed)
ANTICOAGULATION CONSULT NOTE - Follow Up Consult  Pharmacy Consult for Heparin Indication: atrial fibrillation  No Known Allergies  Patient Measurements: Height: 5' 6.93" (170 cm) Weight: 126.8 kg (279 lb 8.7 oz) IBW/kg (Calculated) : 65.94 Heparin Dosing Weight:    Vital Signs: Temp: 97.8 F (36.6 C) (06/15 0440) Temp Source: Oral (06/14 2104) BP: 156/77 (06/15 0440) Pulse Rate: 64 (06/15 0440)  Labs: Recent Labs    11/16/21 0610 11/17/21 0427 11/17/21 0435 11/18/21 0438  HGB 12.3* 13.3  --  11.7*  HCT 37.4* 40.3  --  36.4*  PLT 243 183  --  268  HEPARINUNFRC 0.41  --  0.56 0.39  CREATININE 1.36* 1.34*  --   --     Estimated Creatinine Clearance: 71.1 mL/min (A) (by C-G formula based on SCr of 1.34 mg/dL (H)).   Assessment: AC/Heme: Heparin for new Afib - Hep level 0.39, Hgb 13.3>11.7 down, Plts WNL  Goal of Therapy:  Heparin level 0.3-0.7 units/ml Monitor platelets by anticoagulation protocol: Yes   Plan:  Heparin 2600 units/hr Daily HL and CBC GI problems limiting transition to [place]. Alford Highland, PharmD, BCPS Clinical Staff Pharmacist [email or web address]  Alford Highland, [facility]ccambridge Stillinger 11/18/2021,7:53 AM

## 2021-11-18 NOTE — Progress Notes (Addendum)
Progress Note     Subjective: Pt reports abdominal pain is stable. We reviewed images this AM and discussed plan for potential IR drain replacement today. He is still very hopeful to get through this without a surgery. We discussed both yesterday evening and today that he may still require acute surgery for this but we'll see how he does with repeat drainage first.   Objective: Vital signs in last 24 hours: Temp:  [97.8 F (36.6 C)-98.6 F (37 C)] 97.8 F (36.6 C) (06/15 0440) Pulse Rate:  [55-64] 64 (06/15 0440) Resp:  [18-20] 18 (06/15 0440) BP: (149-156)/(69-82) 156/77 (06/15 0440) SpO2:  [95 %-98 %] 95 % (06/15 0440) Last BM Date : 11/17/21  Intake/Output from previous day: 06/14 0701 - 06/15 0700 In: 3254 [P.O.:360; I.V.:912; IV Piggyback:200] Out: 1150 [Urine:1150] Intake/Output this shift: No intake/output data recorded.  PE: General: pleasant, WD, obese male Heart: regular, rate, and rhythm.   Lungs: normal effort  Abd: soft, less TTP along left flank and lower abdomen, no peritonitis or guarding, ND Psych: A&Ox3 with an appropriate affect.    Lab Results:  Recent Labs    11/17/21 0427 11/18/21 0438  WBC 13.0* 14.2*  HGB 13.3 11.7*  HCT 40.3 36.4*  PLT 183 268    BMET Recent Labs    11/17/21 0427 11/18/21 0832  NA 135 135  K 4.2 3.6  CL 108 108  CO2 19* 21*  GLUCOSE 87 92  BUN 28* 22  CREATININE 1.34* 1.40*  CALCIUM 8.0* 7.9*    PT/INR No results for input(s): "LABPROT", "INR" in the last 72 hours. CMP     Component Value Date/Time   NA 135 11/18/2021 0832   NA 133 (A) 06/24/2021 0000   K 3.6 11/18/2021 0832   CL 108 11/18/2021 0832   CO2 21 (L) 11/18/2021 0832   GLUCOSE 92 11/18/2021 0832   BUN 22 11/18/2021 0832   BUN 18 06/24/2021 0000   CREATININE 1.40 (H) 11/18/2021 0832   CALCIUM 7.9 (L) 11/18/2021 0832   PROT 6.5 11/17/2021 0427   ALBUMIN 2.1 (L) 11/17/2021 0427   AST 42 (H) 11/17/2021 0427   ALT 37 11/17/2021 0427    ALKPHOS 51 11/17/2021 0427   BILITOT 1.2 11/17/2021 0427   GFRNONAA 56 (L) 11/18/2021 0832   GFRAA >60 02/19/2018 1123   Lipase     Component Value Date/Time   LIPASE 40 11/07/2021 1029       Studies/Results: CT ABDOMEN PELVIS WO CONTRAST  Result Date: 11/17/2021 CLINICAL DATA:  Follow-up complicated diverticular abscesses. Recently patient had pelvic catheter removed. EXAM: CT ABDOMEN AND PELVIS WITHOUT CONTRAST TECHNIQUE: Multidetector CT imaging of the abdomen and pelvis was performed following the standard protocol without IV contrast. RADIATION DOSE REDUCTION: This exam was performed according to the departmental dose-optimization program which includes automated exposure control, adjustment of the mA and/or kV according to patient size and/or use of iterative reconstruction technique. COMPARISON:  Prior CT scan 11/12/2021 FINDINGS: Lower chest: Persistent small pleural effusions and bibasilar atelectasis. Hepatobiliary: Stable cirrhotic changes involving liver no hepatic lesions are identified. Evidence of portal venous hypertension and portal venous collaterals. Layering high attenuation material in the gallbladder is likely sludge. No findings suspicious for acute cholecystitis. No common bile duct dilatation. Pancreas: No mass, inflammation or ductal dilatation. Spleen: Stable splenomegaly. Adrenals/Urinary Tract: Stable bilateral adrenal gland adenomas. Stable simple and hemorrhagic renal cysts. The bladder is unremarkable. Stomach/Bowel: The stomach duodenum, small and colon are grossly normal.  There is contrast in the distal ileum and also throughout the colon. I do not see any leaking contrast. Vascular/Lymphatic: Stable vascular calcifications and scattered mesenteric and retroperitoneal lymph nodes. Reproductive: The prostate gland is mildly enlarged but stable. Other: There is a recurrent abscess in the central low pelvis between the bladder and rectum. It measures approximately 9.4  x 6.6 cm. There is a loculated fluid collection in the left pericolic gutter region lateral to the descending colon which is slightly larger when compared to the prior CT scan. It does not contain any gas. It measures approximately 7.8 x 3.2 cm on coronal image number 60. Difficult to exclude other central pelvic abscesses versus dilated small bowel loops with air-fluid levels. No free air or leaking oral contrast is demonstrated. Musculoskeletal: No significant bony findings. IMPRESSION: 1. Recurrent central low pelvic abscess measuring approximately 9.4 x 6.6 cm. 2. Slightly larger loculated fluid collection in the left paracolic gutter region lateral to the descending colon. Difficult to exclude other central pelvic abscesses versus dilated small bowel loops with air-fluid levels. 3. No free air or leaking oral contrast is demonstrated. 4. Stable cirrhotic changes involving liver with portal venous hypertension, portal venous collaterals and splenomegaly. 5. Stable bilateral adrenal gland adenomas. 6. Stable simple and hemorrhagic renal cysts. 7. Persistent small pleural effusions and bibasilar atelectasis. These results will be called to the ordering clinician or representative by the Radiologist Assistant, and communication documented in the PACS or [employer]. Electronically Signed   By: Marijo Sanes M.D.   On: 11/17/2021 15:26    Anti-infectives: Anti-infectives (From admission, onward)    Start     Dose/Rate Route Frequency Ordered Stop   11/08/21 0730  vancomycin (VANCOREADY) IVPB 1750 mg/350 mL        1,750 mg 175 mL/hr over 120 Minutes Intravenous  Once 11/08/21 0641 11/08/21 0908   11/08/21 0641  vancomycin variable dose per unstable renal function (pharmacist dosing)  Status:  Discontinued         Does not apply See admin instructions 11/08/21 0641 11/08/21 1138   11/07/21 2000  piperacillin-tazobactam (ZOSYN) IVPB 3.375 g        3.375 g 12.5 mL/hr over 240 Minutes Intravenous Every  8 hours 11/07/21 1921     11/07/21 1430  piperacillin-tazobactam (ZOSYN) IVPB 3.375 g        3.375 g 100 mL/hr over 30 Minutes Intravenous  Once 11/07/21 1418 11/07/21 1535        Assessment/Plan 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  - s/p IR drain placement 6/5 - drainage purulent, Cx with pan-sensitive E.coli  - repeat CT AP 6/9 - with resolution of large abscess but some persistent interloop collections  - IR removed TG drain 6/13 - WBC stable around 14 - repeat CT 6/14 with recurrent pelvic abscess, larger collection in the left paracolic gutter - IR consulted for drainage of recurrent collections, concerned that conservative management has failed at this point - reasonable to try to drain collections again and see how patient does, if not improving then likely will need to just proceed with Hartmann's. If able to improve and get better source control of infection may be able to try to get patient to a one step procedure with colorectal surgeon. We will continue to follow closely    FEN: NPO, IVF '@50'$  cc/h VTE: SCDs, hep gtt ID: Zosyn 6/4>>   - per CCM -  A. 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     Norm Parcel, [facility] Surgery 11/18/2021, 9:21 AM Please see Amion for pager number during day hours 7:00am-4:30pm

## 2021-11-19 DIAGNOSIS — K651 Peritoneal abscess: Secondary | ICD-10-CM | POA: Diagnosis not present

## 2021-11-19 LAB — BASIC METABOLIC PANEL
Anion gap: 7 (ref 5–15)
BUN: 21 mg/dL (ref 8–23)
CO2: 23 mmol/L (ref 22–32)
Calcium: 8 mg/dL — ABNORMAL LOW (ref 8.9–10.3)
Chloride: 104 mmol/L (ref 98–111)
Creatinine, Ser: 1.42 mg/dL — ABNORMAL HIGH (ref 0.61–1.24)
GFR, Estimated: 55 mL/min — ABNORMAL LOW (ref >60)
Glucose, Bld: 97 mg/dL (ref 70–99)
Potassium: 3.8 mmol/L (ref 3.5–5.1)
Sodium: 134 mmol/L — ABNORMAL LOW (ref 135–145)

## 2021-11-19 LAB — GLUCOSE, CAPILLARY
Glucose-Capillary: 100 mg/dL — ABNORMAL HIGH (ref 70–99)
Glucose-Capillary: 105 mg/dL — ABNORMAL HIGH (ref 70–99)
Glucose-Capillary: 111 mg/dL — ABNORMAL HIGH (ref 70–99)
Glucose-Capillary: 114 mg/dL — ABNORMAL HIGH (ref 70–99)
Glucose-Capillary: 84 mg/dL (ref 70–99)
Glucose-Capillary: 96 mg/dL (ref 70–99)

## 2021-11-19 LAB — CBC
HCT: 35.3 % — ABNORMAL LOW (ref 39.0–52.0)
Hemoglobin: 11.4 g/dL — ABNORMAL LOW (ref 13.0–17.0)
MCH: 31.8 pg (ref 26.0–34.0)
MCHC: 32.3 g/dL (ref 30.0–36.0)
MCV: 98.3 fL (ref 80.0–100.0)
Platelets: 313 10*3/uL (ref 150–400)
RBC: 3.59 MIL/uL — ABNORMAL LOW (ref 4.22–5.81)
RDW: 14.5 % (ref 11.5–15.5)
WBC: 13.2 10*3/uL — ABNORMAL HIGH (ref 4.0–10.5)
nRBC: 0 % (ref 0.0–0.2)

## 2021-11-19 LAB — HEPARIN LEVEL (UNFRACTIONATED): Heparin Unfractionated: 0.31 IU/mL (ref 0.30–0.70)

## 2021-11-19 LAB — MAGNESIUM: Magnesium: 2 mg/dL (ref 1.7–2.4)

## 2021-11-19 NOTE — Progress Notes (Signed)
PROGRESS NOTE    DRAYVEN MARCHENA  EQA:[id number] DOB: December 28, 1955 DOA: 11/07/2021 PCP: Jeanie Sewer, NP  Brief Narrative: 66 year old with history of DM2, HTN, CKD admitted to the hospital for severe sepsis due to intra-abdominal abscess.  CT abdomen pelvis showed multiple loculated fluid collection in the lower abdomen with a large cavity in the pelvis measuring 11.5 X6X 11.7 cm.  Due to septic shock he was transferred to the ICU on 6/5.  Patient was seen by IR and drain was placed.  Hospital course was also complicated by acute kidney injury, hypoxia and atrial fibrillation with RVR.  Repeat CT showed resolution of abscess or the drain is in place but does have other pockets of fluid collection.  Patient is also on Cardizem drip, slowly uptitrating beta-blocker and heparin drip.  Repeat CT showed persistent abscess patient had drain placed 11/18/2021 by interventional radiology.   Assessment & Plan:   Principal Problem:   Intra-abdominal abscesses s/p perc drainage 11/08/2021 Active Problems:   Essential hypertension   Type 2 diabetes mellitus with morbid obesity (HCC)   Diverticulosis   Hyponatremia   Acute renal failure superimposed on chronic kidney disease (HCC)   Sleep apnea   Osteoarthritis   CKD (chronic kidney disease) stage 3, GFR 30-59 ml/min (HCC)   Adrenal nodule (HCC)   History of adenomatous polyp of colon   Spinal stenosis of lumbar region   Renal cyst   Cholelithiasis   Coffee ground emesis   Septic shock (HCC)   Tobacco abuse   #1 multiple intra-abdominal abscess likely from diverticular micro perforation Septic shock secondary to above Patient had IR drain placed which was removed on 11/16/2021. Repeat CT scan 11/17/2021 shows recurrent large abscess 6 x 9 cm. On Zosyn. Patient had IR drain placed  11/18/2021 Follow-up cultures from the drain  #2 new onset A-fib with RVR normal TSH normal echo on metoprolol and heparin drip.  Decrease the dose of metoprolol  due to bradycardia We will start p.o. anticoagulation once all surgical procedures are over.  #3 AKI on CKD stage IIIa resolved.  #4 transaminitis improving  #5 essential hypertension continue metoprolol dose was decreased yesterday due to bradycardia Added Norvasc 5 mg daily Will monitor closely.  #6 obstructive sleep apnea nocturnal CPAP ordered but he refuses most of the nights  #7 acute hypoxic respiratory distress resolved  #8 mild hyponatremia DC half-normal saline patient is able to tolerate some p.o. intake.   Estimated body mass index is 43.67 kg/m as calculated from the following:   Height as of this encounter: 5' 6.93" (1.7 m).   Weight as of this encounter: 126.2 kg.  DVT prophylaxis: Heparin  code Status: Full code Family Communication: None  disposition Plan:  Status is: Inpatient Remains inpatient appropriate because: Recurrent intra-abdominal abscess   Consultants:  General surgery  Procedures: IR placement of intra-abdominal drain 6/ 15 /2023 Antimicrobials: Zosyn  Subjective:  Patient had 2 drains placed yesterday he denies any abdominal pain Objective: Vitals:   11/18/21 1900 11/18/21 2114 11/18/21 2310 11/19/21 0457  BP:  139/71  131/73  Pulse:  (!) 58  (!) 51  Resp: '19 18  17  '$ Temp:  98.7 F (37.1 C)  98.3 F (36.8 C)  TempSrc:  Oral    SpO2:  95%  97%  Weight:   126.2 kg   Height:        Intake/Output Summary (Last 24 hours) at 11/19/2021 1110 Last data filed at 11/19/2021 0900 Gross per  24 hour  Intake 2583.57 ml  Output 1040 ml  Net 1543.57 ml    Filed Weights   11/14/21 2334 11/16/21 2341 11/18/21 2310  Weight: 123.5 kg 126.8 kg 126.2 kg    Examination:  General exam: Appears calm and comfortable  Respiratory system: Clear to auscultation. Respiratory effort normal. Cardiovascular system: S1 & S2 heard, RRR. No JVD, murmurs, rubs, gallops or clicks. No pedal edema. Gastrointestinal system: Abdomen is distended, soft and  nontender. No organomegaly or masses felt. Normal bowel sounds heard. 2 drains in place left lower quadrant and right lower quadrant. Central nervous system: Alert and oriented. No focal neurological deficits. Extremities: Symmetric 5 x 5 power. Skin: No rashes, lesions or ulcers Psychiatry: Judgement and insight appear normal. Mood & affect appropriate.     Data Reviewed: I have personally reviewed following labs and imaging studies  CBC: Recent Labs  Lab 11/15/21 0333 11/16/21 0610 11/17/21 0427 11/18/21 0438 11/19/21 0342  WBC 13.0* 14.6* 13.0* 14.2* 13.2*  HGB 12.1* 12.3* 13.3 11.7* 11.4*  HCT 38.0* 37.4* 40.3 36.4* 35.3*  MCV 98.7 98.4 97.8 98.9 98.3  PLT 206 243 183 268 976    Basic Metabolic Panel: Recent Labs  Lab 11/15/21 0333 11/16/21 0610 11/17/21 0427 11/18/21 0438 11/18/21 0832 11/19/21 0342  NA 141 140 135  --  135 134*  K 3.4* 3.5 4.2  --  3.6 3.8  CL 110 110 108  --  108 104  CO2 22 23 19*  --  21* 23  GLUCOSE 96 94 87  --  92 97  BUN 35* 34* 28*  --  22 21  CREATININE 1.32* 1.36* 1.34*  --  1.40* 1.42*  CALCIUM 8.0* 8.0* 8.0*  --  7.9* 8.0*  MG 1.8 1.8 1.9 1.6*  --  2.0    GFR: Estimated Creatinine Clearance: 66.9 mL/min (A) (by C-G formula based on SCr of 1.42 mg/dL (H)). Liver Function Tests: Recent Labs  Lab 11/13/21 0404 11/14/21 0423 11/15/21 0333 11/16/21 0610 11/17/21 0427  AST 68* 71* 66* 46* 42*  ALT 48* 54* 53* 45* 37  ALKPHOS 45 48 43 48 51  BILITOT 1.1 1.1 1.1 0.9 1.2  PROT 6.1* 6.8 6.3* 6.4* 6.5  ALBUMIN 1.9* 2.1* 2.0* 2.0* 2.1*    No results for input(s): "LIPASE", "AMYLASE" in the last 168 hours. No results for input(s): "AMMONIA" in the last 168 hours. Coagulation Profile: Recent Labs  Lab 11/12/21 1327  INR 1.3*    Cardiac Enzymes: No results for input(s): "CKTOTAL", "CKMB", "CKMBINDEX", "TROPONINI" in the last 168 hours. BNP (last 3 results) No results for input(s): "PROBNP" in the last 8760  hours. HbA1C: No results for input(s): "HGBA1C" in the last 72 hours. CBG: Recent Labs  Lab 11/18/21 1625 11/18/21 2012 11/18/21 2308 11/19/21 0316 11/19/21 0728  GLUCAP 84 131* 96 100* 84    Lipid Profile: No results for input(s): "CHOL", "HDL", "LDLCALC", "TRIG", "CHOLHDL", "LDLDIRECT" in the last 72 hours. Thyroid Function Tests: No results for input(s): "TSH", "T4TOTAL", "FREET4", "T3FREE", "THYROIDAB" in the last 72 hours. Anemia Panel: No results for input(s): "VITAMINB12", "FOLATE", "FERRITIN", "TIBC", "IRON", "RETICCTPCT" in the last 72 hours. Sepsis Labs: No results for input(s): "PROCALCITON", "LATICACIDVEN" in the last 168 hours.  Recent Results (from the past 240 hour(s))  Aerobic/Anaerobic Culture w Gram Stain (surgical/deep wound)     Status: None (Preliminary result)   Collection Time: 11/18/21  4:08 PM   Specimen: Abscess  Result Value Ref Range Status  Specimen Description ABSCESS ABDOMEN  Final   Special Requests LEFT LATERAL ABDOMEN DRAIN NO 2  Final   Gram Stain   Final    NO SQUAMOUS EPITHELIAL CELLS SEEN ABUNDANT WBC SEEN NO ORGANISMS SEEN Performed at [facility], [address] [place], [place] [address]    Culture PENDING  Incomplete   Report Status PENDING  Incomplete  Aerobic/Anaerobic Culture w Gram Stain (surgical/deep wound)     Status: None (Preliminary result)   Collection Time: 11/18/21  4:08 PM   Specimen: Abscess  Result Value Ref Range Status   Specimen Description ABSCESS ABDOMEN  Final   Special Requests RT LOWER QUAD DRAIN NO 1  Final   Gram Stain   Final    NO SQUAMOUS EPITHELIAL CELLS SEEN MODERATE WBC SEEN NO ORGANISMS SEEN Performed at [facility], [place] [address] [place], [place] [address]    Culture PENDING  Incomplete   Report Status PENDING  Incomplete         Radiology Studies: CT IMAGE GUIDED DRAINAGE BY PERCUTANEOUS CATHETER  Result Date: 11/18/2021 INDICATION: 66 year old with  intra-abdominal abscesses, suspect diverticular origin but etiology is unclear. Previous right transgluteal drain was removed. Re-accumulation of an air-fluid collection in the pelvis along with additional collections throughout the abdomen. EXAM: 1. CT-guided placement of a drain in the right lower abdominal abscess 2. CT-guided placement of a drain in left lateral abdominal abscess MEDICATIONS: Moderate sedation ANESTHESIA/SEDATION: Moderate (conscious) sedation was employed during this procedure. A total of Versed 4.'0mg'$  and fentanyl 150 mcg was administered intravenously at the order of the provider performing the procedure. Total intra-service moderate sedation time: 66 minutes. Patient's level of consciousness and vital signs were monitored continuously by radiology nurse throughout the procedure under the supervision of the provider performing the procedure. COMPLICATIONS: None immediate. PROCEDURE: Informed written consent was obtained from the patient after a thorough discussion of the procedural risks, benefits and alternatives. All questions were addressed. Maximal Sterile Barrier Technique was utilized including caps, mask, sterile gowns, sterile gloves, sterile drape, hand hygiene and skin antiseptic. A timeout was performed prior to the initiation of the procedure. Patient was placed in left lateral decubitus position. Images through the abdomen and pelvis were obtained. The large air-fluid collection the right lower quadrant of the abdomen was targeted. The right side of the abdomen was prepped with chlorhexidine and sterile field was created. Skin was anesthetized with 1% lidocaine. Small incision was made. Using CT guidance, 18 gauge trocar needle was directed into the collection and superstiff Amplatz wire was advanced into the collection. The tract was dilated to accommodate 12 [place] drain. Approximately 80 mL of brown purulent fluid was removed from this collection and follow up CT images were  obtained. This drain appeared to be decompressing not only the right lower quadrant abscess but also the pelvic abscess. Therefore, another transgluteal drain was not placed. The catheter was sutured to skin and attached to a suction bulb. Fluid was collected for culture. The patient was rolled into the onto his back and images were obtained in the supine position. The fluid collection along the lateral left abdomen was targeted. The left side of the abdomen was prepped chlorhexidine and sterile field was created. Maximal barrier sterile technique was utilized including caps, mask, sterile gowns, sterile gloves, sterile drape, hand hygiene and skin antiseptic. Skin was anesthetized with 1% lidocaine. Small incision was made. Using CT guidance, an 18 gauge trocar needle was directed into the collection  and cloudy yellow fluid was aspirated. Superstiff Amplatz wire was advanced and the tract was dilated to accommodate a 10 [place] multipurpose drain. Approximately 30 mL of cloudy yellow purulent fluid was removed. Follow up CT images were obtained. Drain was sutured to skin and attached to a suction bulb. Fluid was sent for culture. RADIATION DOSE REDUCTION: This exam was performed according to the departmental dose-optimization program which includes automated exposure control, adjustment of the mA and/or kV according to patient size and/or use of iterative reconstruction technique. FINDINGS: 101 French drain placed in the large gas-filled collection in the right lower quadrant. 80 mL of purulent fluid was removed from this drain. 57 French drain was decompressing the right lower quadrant abscess as well as the pelvic abscess. 10 French drain placed in left lateral abdominal abscess. 30 mL of cloudy yellow fluid was removed from this drain. Enlargement of the right piriformis muscle compatible with a hematoma formation from the previous transgluteal drain. IMPRESSION: 1. CT-guided placement of abscess drain in the  right lower quadrant abscess. This drain appears to be decompressing the right lower quadrant abscess and the pelvic abscess. 2. CT-guided placement of a drainage catheter in the left lateral abdominal abscess. Electronically Signed   By: Markus Daft M.D.   On: 11/18/2021 17:37   CT IMAGE GUIDED DRAINAGE BY PERCUTANEOUS CATHETER  Result Date: 11/18/2021 INDICATION: 66 year old with intra-abdominal abscesses, suspect diverticular origin but etiology is unclear. Previous right transgluteal drain was removed. Re-accumulation of an air-fluid collection in the pelvis along with additional collections throughout the abdomen. EXAM: 1. CT-guided placement of a drain in the right lower abdominal abscess 2. CT-guided placement of a drain in left lateral abdominal abscess MEDICATIONS: Moderate sedation ANESTHESIA/SEDATION: Moderate (conscious) sedation was employed during this procedure. A total of Versed 4.'0mg'$  and fentanyl 150 mcg was administered intravenously at the order of the provider performing the procedure. Total intra-service moderate sedation time: 66 minutes. Patient's level of consciousness and vital signs were monitored continuously by radiology nurse throughout the procedure under the supervision of the provider performing the procedure. COMPLICATIONS: None immediate. PROCEDURE: Informed written consent was obtained from the patient after a thorough discussion of the procedural risks, benefits and alternatives. All questions were addressed. Maximal Sterile Barrier Technique was utilized including caps, mask, sterile gowns, sterile gloves, sterile drape, hand hygiene and skin antiseptic. A timeout was performed prior to the initiation of the procedure. Patient was placed in left lateral decubitus position. Images through the abdomen and pelvis were obtained. The large air-fluid collection the right lower quadrant of the abdomen was targeted. The right side of the abdomen was prepped with chlorhexidine and  sterile field was created. Skin was anesthetized with 1% lidocaine. Small incision was made. Using CT guidance, 18 gauge trocar needle was directed into the collection and superstiff Amplatz wire was advanced into the collection. The tract was dilated to accommodate 12 [place] drain. Approximately 80 mL of brown purulent fluid was removed from this collection and follow up CT images were obtained. This drain appeared to be decompressing not only the right lower quadrant abscess but also the pelvic abscess. Therefore, another transgluteal drain was not placed. The catheter was sutured to skin and attached to a suction bulb. Fluid was collected for culture. The patient was rolled into the onto his back and images were obtained in the supine position. The fluid collection along the lateral left abdomen was targeted. The left side of the abdomen was prepped chlorhexidine and sterile field  was created. Maximal barrier sterile technique was utilized including caps, mask, sterile gowns, sterile gloves, sterile drape, hand hygiene and skin antiseptic. Skin was anesthetized with 1% lidocaine. Small incision was made. Using CT guidance, an 18 gauge trocar needle was directed into the collection and cloudy yellow fluid was aspirated. Superstiff Amplatz wire was advanced and the tract was dilated to accommodate a 10 [place] multipurpose drain. Approximately 30 mL of cloudy yellow purulent fluid was removed. Follow up CT images were obtained. Drain was sutured to skin and attached to a suction bulb. Fluid was sent for culture. RADIATION DOSE REDUCTION: This exam was performed according to the departmental dose-optimization program which includes automated exposure control, adjustment of the mA and/or kV according to patient size and/or use of iterative reconstruction technique. FINDINGS: 81 French drain placed in the large gas-filled collection in the right lower quadrant. 80 mL of purulent fluid was removed from this drain. 8  French drain was decompressing the right lower quadrant abscess as well as the pelvic abscess. 10 French drain placed in left lateral abdominal abscess. 30 mL of cloudy yellow fluid was removed from this drain. Enlargement of the right piriformis muscle compatible with a hematoma formation from the previous transgluteal drain. IMPRESSION: 1. CT-guided placement of abscess drain in the right lower quadrant abscess. This drain appears to be decompressing the right lower quadrant abscess and the pelvic abscess. 2. CT-guided placement of a drainage catheter in the left lateral abdominal abscess. Electronically Signed   By: Markus Daft M.D.   On: 11/18/2021 17:37   CT ABDOMEN PELVIS WO CONTRAST  Result Date: 11/17/2021 CLINICAL DATA:  Follow-up complicated diverticular abscesses. Recently patient had pelvic catheter removed. EXAM: CT ABDOMEN AND PELVIS WITHOUT CONTRAST TECHNIQUE: Multidetector CT imaging of the abdomen and pelvis was performed following the standard protocol without IV contrast. RADIATION DOSE REDUCTION: This exam was performed according to the departmental dose-optimization program which includes automated exposure control, adjustment of the mA and/or kV according to patient size and/or use of iterative reconstruction technique. COMPARISON:  Prior CT scan 11/12/2021 FINDINGS: Lower chest: Persistent small pleural effusions and bibasilar atelectasis. Hepatobiliary: Stable cirrhotic changes involving liver no hepatic lesions are identified. Evidence of portal venous hypertension and portal venous collaterals. Layering high attenuation material in the gallbladder is likely sludge. No findings suspicious for acute cholecystitis. No common bile duct dilatation. Pancreas: No mass, inflammation or ductal dilatation. Spleen: Stable splenomegaly. Adrenals/Urinary Tract: Stable bilateral adrenal gland adenomas. Stable simple and hemorrhagic renal cysts. The bladder is unremarkable. Stomach/Bowel: The stomach  duodenum, small and colon are grossly normal. There is contrast in the distal ileum and also throughout the colon. I do not see any leaking contrast. Vascular/Lymphatic: Stable vascular calcifications and scattered mesenteric and retroperitoneal lymph nodes. Reproductive: The prostate gland is mildly enlarged but stable. Other: There is a recurrent abscess in the central low pelvis between the bladder and rectum. It measures approximately 9.4 x 6.6 cm. There is a loculated fluid collection in the left pericolic gutter region lateral to the descending colon which is slightly larger when compared to the prior CT scan. It does not contain any gas. It measures approximately 7.8 x 3.2 cm on coronal image number 60. Difficult to exclude other central pelvic abscesses versus dilated small bowel loops with air-fluid levels. No free air or leaking oral contrast is demonstrated. Musculoskeletal: No significant bony findings. IMPRESSION: 1. Recurrent central low pelvic abscess measuring approximately 9.4 x 6.6 cm. 2. Slightly larger loculated fluid  collection in the left paracolic gutter region lateral to the descending colon. Difficult to exclude other central pelvic abscesses versus dilated small bowel loops with air-fluid levels. 3. No free air or leaking oral contrast is demonstrated. 4. Stable cirrhotic changes involving liver with portal venous hypertension, portal venous collaterals and splenomegaly. 5. Stable bilateral adrenal gland adenomas. 6. Stable simple and hemorrhagic renal cysts. 7. Persistent small pleural effusions and bibasilar atelectasis. These results will be called to the ordering clinician or representative by the Radiologist Assistant, and communication documented in the PACS or [employer]. Electronically Signed   By: Marijo Sanes M.D.   On: 11/17/2021 15:26        Scheduled Meds:  amLODipine  5 mg Oral Daily   chlorhexidine  15 mL Mouth Rinse BID   Chlorhexidine Gluconate Cloth  6 each  Topical Daily   feeding supplement  237 mL Oral BID BM   insulin aspart  0-15 Units Subcutaneous TID WC   lip balm  1 application  Topical BID   mouth rinse  15 mL Mouth Rinse q12n4p   metoprolol succinate  50 mg Oral Daily   pantoprazole  40 mg Oral BID   sodium chloride flush  10-40 mL Intracatheter Q12H   sodium chloride flush  3 mL Intravenous Q12H   sodium chloride flush  5 mL Intracatheter Q8H   Continuous Infusions:  sodium chloride 50 mL/hr at 11/19/21 0600   sodium chloride 250 mL (11/08/21 0653)   sodium chloride Stopped (11/09/21 1144)   sodium chloride 10 mL/hr at 11/19/21 0600   heparin 2,650 Units/hr (11/19/21 0757)   ondansetron (ZOFRAN) IV     piperacillin-tazobactam (ZOSYN)  IV 12.5 mL/hr at 11/19/21 0600     LOS: 12 days    Time spent: 35 minutes  Georgette Shell, MD  11/19/2021, 11:10 AM

## 2021-11-19 NOTE — TOC Progression Note (Signed)
Transition of Care [facility]) - Progression Note    Patient Details  Name: Robert Larson MRN: [id number] Date of Birth: [birth year]  [facility]) CM/SW Contact  Leeroy Cha, RN Phone Number: 11/19/2021, 8:35 AM  Clinical Narrative:     Chart Reviewed.  Following for toc needs.    Barriers to Discharge: Continued Medical Work up  Expected Discharge Plan and Services                                                 Social Determinants of Health (SDOH) Interventions    Readmission Risk Interventions     No data to display

## 2021-11-19 NOTE — Progress Notes (Addendum)
Physical Therapy Treatment Patient Details Name: HRISTOPHER MISSILDINE MRN: [id number] DOB: 1956-01-09 Today's Date: 11/19/2021   History of Present Illness 66 year old with history of DM2, HTN, CKD admitted to the hospital for severe sepsis due to intra-abdominal abscess.  CT abdomen pelvis showed multiple loculated fluid collection in the lower abdomen with a large cavity in the pelvis measuring 11.5 X6X 11.7 cm.  Due to septic shock he was transferred to the ICU on 6/5.  Patient was seen by IR and drain was placed.  Hospital course was also complicated by acute kidney injury, hypoxia and atrial fibrillation with RVR.  Repeat CT showed resolution of abscess or the drain is in place but does have other pockets of fluid collection.  Patient is also on Cardizem drip, slowly uptitrating beta-blocker and heparin drip.   Repeat CT showed persistent abscess patient had drain placed 11/18/2021 by interventional radiology.    PT Comments    General Comments: AxO x 3 very pleasant man but getting frustrated that his condition is not progressing fast enough. Daughter in room and she is very helpful.  She is an Therapist, sports who also use to work here at [employer].  Assisted OOB to amb.  General bed mobility comments: required MAX asisst upper body and increased time due to ABD pain 8/10 with this activity. General transfer comment: Assist to power up, stabilize, control descent. Cues for safety, technique.  Pt unable to push self up so tends to forward lean and rise while holding to walker.General Gait Details: 25% VC's on proper walker to self distance and safety with turns.  Pt tolerated amb twice 30 feet x 2 with one seated rest break. Pt plans to D/C back home with family support. Advised daughter she CAN amb pt this weekend in hallway with walker. She is a Marine scientist.   Recommendations for follow up therapy are one component of a multi-disciplinary discharge planning process, led by the attending physician.  Recommendations may be  updated based on patient status, additional functional criteria and insurance authorization.  Follow Up Recommendations  Home health PT     Assistance Recommended at Discharge Frequent or constant Supervision/Assistance  Patient can return home with the following A lot of help with bathing/dressing/bathroom;A lot of help with walking and/or transfers;Assist for transportation;Help with stairs or ramp for entrance;Assistance with cooking/housework   Equipment Recommendations  Rolling walker (2 wheels) (Bariatric)    Recommendations for Other Services       Precautions / Restrictions Precautions Precautions: Fall Precaution Comments: B ABD JP drains (bulbs) + L CVC triple Lumen Restrictions Weight Bearing Restrictions: No     Mobility  Bed Mobility Overal bed mobility: Needs Assistance Bed Mobility: Supine to Sit     Supine to sit: Max assist     General bed mobility comments: required MAX asisst upper body and increased time due to ABD pain 8/10 with this activity.    Transfers Overall transfer level: Needs assistance Equipment used: Rolling walker (2 wheels) Judie Petit) Transfers: Sit to/from Stand, Bed to chair/wheelchair/BSC Sit to Stand: Min assist, +2 safety/equipment   Step pivot transfers: Min assist       General transfer comment: Assist to power up, stabilize, control descent. Cues for safety, technique.  Pt unable to push self up so tends to forward lean and rise while holding to walker.    Ambulation/Gait Ambulation/Gait assistance: Min assist, +2 safety/equipment Gait Distance (Feet): 60 Feet (30 feet x 2) Assistive device: Rolling walker (2 wheels) Judie Petit) Gait Pattern/deviations:  Step-to pattern, Step-through pattern, Decreased step length - right, Decreased step length - left, Shuffle, Trunk flexed Gait velocity: decreased     General Gait Details: 25% VC's on proper walker to self distance and safety with turns.   Stairs              Wheelchair Mobility    Modified Rankin (Stroke Patients Only)       Balance                                            Cognition Arousal/Alertness: Awake/alert Behavior During Therapy: WFL for tasks assessed/performed, Flat affect Overall Cognitive Status: Within Functional Limits for tasks assessed                                 General Comments: AxO x 3 very pleasant man but getting frustrated that his condition is not progressing fast enough.        Exercises      General Comments        Pertinent Vitals/Pain Pain Assessment Pain Score: 6  Faces Pain Scale: Hurts a little bit Pain Location: ABD "all across" Pain Descriptors / Indicators: Discomfort, Grimacing, Guarding Pain Intervention(s): Monitored during session    Home Living                          Prior Function            PT Goals (current goals can now be found in the care plan section) Progress towards PT goals: Progressing toward goals    Frequency    Min 3X/week      PT Plan Current plan remains appropriate    Co-evaluation              AM-PAC PT "6 Clicks" Mobility   Outcome Measure  Help needed turning from your back to your side while in a flat bed without using bedrails?: A Lot Help needed moving from lying on your back to sitting on the side of a flat bed without using bedrails?: A Lot Help needed moving to and from a bed to a chair (including a wheelchair)?: A Little Help needed standing up from a chair using your arms (e.g., wheelchair or bedside chair)?: A Little Help needed to walk in hospital room?: A Little Help needed climbing 3-5 steps with a railing? : A Little 6 Click Score: 16    End of Session Equipment Utilized During Treatment: Gait belt Activity Tolerance: Patient limited by fatigue;Patient limited by pain Patient left: in chair;with call bell/phone within reach;with family/visitor present Nurse  Communication: Mobility status PT Visit Diagnosis: Muscle weakness (generalized) (M62.81);Difficulty in walking, not elsewhere classified (R26.2)     Time: 0938-1829 PT Time Calculation (min) (ACUTE ONLY): 21 min  Charges:  $Gait Training: 8-22 mins                     {Yarissa Reining  PTA Acute  Rehabilitation Services Pager      [phone] Office      [phone]

## 2021-11-19 NOTE — Progress Notes (Signed)
Progress Note     Subjective: Pt denies significant abdominal pain this AM. He is tolerating liquids. Still wants to try to avoid a colostomy if possible but starting to be more open to this if not improving.   Objective: Vital signs in last 24 hours: Temp:  [98.3 F (36.8 C)-98.7 F (37.1 C)] 98.3 F (36.8 C) (06/16 0457) Pulse Rate:  [46-62] 51 (06/16 0457) Resp:  [15-22] 17 (06/16 0457) BP: (104-157)/(58-73) 131/73 (06/16 0457) SpO2:  [91 %-98 %] 97 % (06/16 0457) Weight:  [126.2 kg] 126.2 kg (06/15 2310) Last BM Date : 11/18/21  Intake/Output from previous day: 06/15 0701 - 06/16 0700 In: 1983.6 [I.V.:1735.1; IV Piggyback:228.4] Out: 1040 [Urine:750; Drains:290] Intake/Output this shift: Total I/O In: 600 [P.O.:600] Out: -   PE: General: pleasant, WD, obese male Heart: regular, rate, and rhythm.   Lungs: normal effort  Abd: soft, NT, ND, LLQ drain with serous looking fluid, RLQ drain with bloody purulent appearing fluid in bulb but more feculent looking material in tubing Psych: A&Ox3 with an appropriate affect.    Lab Results:  Recent Labs    11/18/21 0438 11/19/21 0342  WBC 14.2* 13.2*  HGB 11.7* 11.4*  HCT 36.4* 35.3*  PLT 268 313   BMET Recent Labs    11/18/21 0832 11/19/21 0342  NA 135 134*  K 3.6 3.8  CL 108 104  CO2 21* 23  GLUCOSE 92 97  BUN 22 21  CREATININE 1.40* 1.42*  CALCIUM 7.9* 8.0*   PT/INR No results for input(s): "LABPROT", "INR" in the last 72 hours. CMP     Component Value Date/Time   NA 134 (L) 11/19/2021 0342   NA 133 (A) 06/24/2021 0000   K 3.8 11/19/2021 0342   CL 104 11/19/2021 0342   CO2 23 11/19/2021 0342   GLUCOSE 97 11/19/2021 0342   BUN 21 11/19/2021 0342   BUN 18 06/24/2021 0000   CREATININE 1.42 (H) 11/19/2021 0342   CALCIUM 8.0 (L) 11/19/2021 0342   PROT 6.5 11/17/2021 0427   ALBUMIN 2.1 (L) 11/17/2021 0427   AST 42 (H) 11/17/2021 0427   ALT 37 11/17/2021 0427   ALKPHOS 51 11/17/2021 0427    BILITOT 1.2 11/17/2021 0427   GFRNONAA 55 (L) 11/19/2021 0342   GFRAA >60 02/19/2018 1123   Lipase     Component Value Date/Time   LIPASE 40 11/07/2021 1029       Studies/Results: CT IMAGE GUIDED DRAINAGE BY PERCUTANEOUS CATHETER  Result Date: 11/18/2021 INDICATION: 66 year old with intra-abdominal abscesses, suspect diverticular origin but etiology is unclear. Previous right transgluteal drain was removed. Re-accumulation of an air-fluid collection in the pelvis along with additional collections throughout the abdomen. EXAM: 1. CT-guided placement of a drain in the right lower abdominal abscess 2. CT-guided placement of a drain in left lateral abdominal abscess MEDICATIONS: Moderate sedation ANESTHESIA/SEDATION: Moderate (conscious) sedation was employed during this procedure. A total of Versed 4.'0mg'$  and fentanyl 150 mcg was administered intravenously at the order of the provider performing the procedure. Total intra-service moderate sedation time: 66 minutes. Patient's level of consciousness and vital signs were monitored continuously by radiology nurse throughout the procedure under the supervision of the provider performing the procedure. COMPLICATIONS: None immediate. PROCEDURE: Informed written consent was obtained from the patient after a thorough discussion of the procedural risks, benefits and alternatives. All questions were addressed. Maximal Sterile Barrier Technique was utilized including caps, mask, sterile gowns, sterile gloves, sterile drape, hand hygiene and skin antiseptic.  A timeout was performed prior to the initiation of the procedure. Patient was placed in left lateral decubitus position. Images through the abdomen and pelvis were obtained. The large air-fluid collection the right lower quadrant of the abdomen was targeted. The right side of the abdomen was prepped with chlorhexidine and sterile field was created. Skin was anesthetized with 1% lidocaine. Small incision was made.  Using CT guidance, 18 gauge trocar needle was directed into the collection and superstiff Amplatz wire was advanced into the collection. The tract was dilated to accommodate 12 [place] drain. Approximately 80 mL of brown purulent fluid was removed from this collection and follow up CT images were obtained. This drain appeared to be decompressing not only the right lower quadrant abscess but also the pelvic abscess. Therefore, another transgluteal drain was not placed. The catheter was sutured to skin and attached to a suction bulb. Fluid was collected for culture. The patient was rolled into the onto his back and images were obtained in the supine position. The fluid collection along the lateral left abdomen was targeted. The left side of the abdomen was prepped chlorhexidine and sterile field was created. Maximal barrier sterile technique was utilized including caps, mask, sterile gowns, sterile gloves, sterile drape, hand hygiene and skin antiseptic. Skin was anesthetized with 1% lidocaine. Small incision was made. Using CT guidance, an 18 gauge trocar needle was directed into the collection and cloudy yellow fluid was aspirated. Superstiff Amplatz wire was advanced and the tract was dilated to accommodate a 10 [place] multipurpose drain. Approximately 30 mL of cloudy yellow purulent fluid was removed. Follow up CT images were obtained. Drain was sutured to skin and attached to a suction bulb. Fluid was sent for culture. RADIATION DOSE REDUCTION: This exam was performed according to the departmental dose-optimization program which includes automated exposure control, adjustment of the mA and/or kV according to patient size and/or use of iterative reconstruction technique. FINDINGS: 4 French drain placed in the large gas-filled collection in the right lower quadrant. 80 mL of purulent fluid was removed from this drain. 62 French drain was decompressing the right lower quadrant abscess as well as the pelvic abscess.  10 French drain placed in left lateral abdominal abscess. 30 mL of cloudy yellow fluid was removed from this drain. Enlargement of the right piriformis muscle compatible with a hematoma formation from the previous transgluteal drain. IMPRESSION: 1. CT-guided placement of abscess drain in the right lower quadrant abscess. This drain appears to be decompressing the right lower quadrant abscess and the pelvic abscess. 2. CT-guided placement of a drainage catheter in the left lateral abdominal abscess. Electronically Signed   By: Markus Daft M.D.   On: 11/18/2021 17:37   CT IMAGE GUIDED DRAINAGE BY PERCUTANEOUS CATHETER  Result Date: 11/18/2021 INDICATION: 66 year old with intra-abdominal abscesses, suspect diverticular origin but etiology is unclear. Previous right transgluteal drain was removed. Re-accumulation of an air-fluid collection in the pelvis along with additional collections throughout the abdomen. EXAM: 1. CT-guided placement of a drain in the right lower abdominal abscess 2. CT-guided placement of a drain in left lateral abdominal abscess MEDICATIONS: Moderate sedation ANESTHESIA/SEDATION: Moderate (conscious) sedation was employed during this procedure. A total of Versed 4.'0mg'$  and fentanyl 150 mcg was administered intravenously at the order of the provider performing the procedure. Total intra-service moderate sedation time: 66 minutes. Patient's level of consciousness and vital signs were monitored continuously by radiology nurse throughout the procedure under the supervision of the provider performing the procedure. COMPLICATIONS: None  immediate. PROCEDURE: Informed written consent was obtained from the patient after a thorough discussion of the procedural risks, benefits and alternatives. All questions were addressed. Maximal Sterile Barrier Technique was utilized including caps, mask, sterile gowns, sterile gloves, sterile drape, hand hygiene and skin antiseptic. A timeout was performed prior to  the initiation of the procedure. Patient was placed in left lateral decubitus position. Images through the abdomen and pelvis were obtained. The large air-fluid collection the right lower quadrant of the abdomen was targeted. The right side of the abdomen was prepped with chlorhexidine and sterile field was created. Skin was anesthetized with 1% lidocaine. Small incision was made. Using CT guidance, 18 gauge trocar needle was directed into the collection and superstiff Amplatz wire was advanced into the collection. The tract was dilated to accommodate 12 [place] drain. Approximately 80 mL of brown purulent fluid was removed from this collection and follow up CT images were obtained. This drain appeared to be decompressing not only the right lower quadrant abscess but also the pelvic abscess. Therefore, another transgluteal drain was not placed. The catheter was sutured to skin and attached to a suction bulb. Fluid was collected for culture. The patient was rolled into the onto his back and images were obtained in the supine position. The fluid collection along the lateral left abdomen was targeted. The left side of the abdomen was prepped chlorhexidine and sterile field was created. Maximal barrier sterile technique was utilized including caps, mask, sterile gowns, sterile gloves, sterile drape, hand hygiene and skin antiseptic. Skin was anesthetized with 1% lidocaine. Small incision was made. Using CT guidance, an 18 gauge trocar needle was directed into the collection and cloudy yellow fluid was aspirated. Superstiff Amplatz wire was advanced and the tract was dilated to accommodate a 10 [place] multipurpose drain. Approximately 30 mL of cloudy yellow purulent fluid was removed. Follow up CT images were obtained. Drain was sutured to skin and attached to a suction bulb. Fluid was sent for culture. RADIATION DOSE REDUCTION: This exam was performed according to the departmental dose-optimization program which includes  automated exposure control, adjustment of the mA and/or kV according to patient size and/or use of iterative reconstruction technique. FINDINGS: 65 French drain placed in the large gas-filled collection in the right lower quadrant. 80 mL of purulent fluid was removed from this drain. 30 French drain was decompressing the right lower quadrant abscess as well as the pelvic abscess. 10 French drain placed in left lateral abdominal abscess. 30 mL of cloudy yellow fluid was removed from this drain. Enlargement of the right piriformis muscle compatible with a hematoma formation from the previous transgluteal drain. IMPRESSION: 1. CT-guided placement of abscess drain in the right lower quadrant abscess. This drain appears to be decompressing the right lower quadrant abscess and the pelvic abscess. 2. CT-guided placement of a drainage catheter in the left lateral abdominal abscess. Electronically Signed   By: Markus Daft M.D.   On: 11/18/2021 17:37   CT ABDOMEN PELVIS WO CONTRAST  Result Date: 11/17/2021 CLINICAL DATA:  Follow-up complicated diverticular abscesses. Recently patient had pelvic catheter removed. EXAM: CT ABDOMEN AND PELVIS WITHOUT CONTRAST TECHNIQUE: Multidetector CT imaging of the abdomen and pelvis was performed following the standard protocol without IV contrast. RADIATION DOSE REDUCTION: This exam was performed according to the departmental dose-optimization program which includes automated exposure control, adjustment of the mA and/or kV according to patient size and/or use of iterative reconstruction technique. COMPARISON:  Prior CT scan 11/12/2021 FINDINGS: Lower chest:  Persistent small pleural effusions and bibasilar atelectasis. Hepatobiliary: Stable cirrhotic changes involving liver no hepatic lesions are identified. Evidence of portal venous hypertension and portal venous collaterals. Layering high attenuation material in the gallbladder is likely sludge. No findings suspicious for acute  cholecystitis. No common bile duct dilatation. Pancreas: No mass, inflammation or ductal dilatation. Spleen: Stable splenomegaly. Adrenals/Urinary Tract: Stable bilateral adrenal gland adenomas. Stable simple and hemorrhagic renal cysts. The bladder is unremarkable. Stomach/Bowel: The stomach duodenum, small and colon are grossly normal. There is contrast in the distal ileum and also throughout the colon. I do not see any leaking contrast. Vascular/Lymphatic: Stable vascular calcifications and scattered mesenteric and retroperitoneal lymph nodes. Reproductive: The prostate gland is mildly enlarged but stable. Other: There is a recurrent abscess in the central low pelvis between the bladder and rectum. It measures approximately 9.4 x 6.6 cm. There is a loculated fluid collection in the left pericolic gutter region lateral to the descending colon which is slightly larger when compared to the prior CT scan. It does not contain any gas. It measures approximately 7.8 x 3.2 cm on coronal image number 60. Difficult to exclude other central pelvic abscesses versus dilated small bowel loops with air-fluid levels. No free air or leaking oral contrast is demonstrated. Musculoskeletal: No significant bony findings. IMPRESSION: 1. Recurrent central low pelvic abscess measuring approximately 9.4 x 6.6 cm. 2. Slightly larger loculated fluid collection in the left paracolic gutter region lateral to the descending colon. Difficult to exclude other central pelvic abscesses versus dilated small bowel loops with air-fluid levels. 3. No free air or leaking oral contrast is demonstrated. 4. Stable cirrhotic changes involving liver with portal venous hypertension, portal venous collaterals and splenomegaly. 5. Stable bilateral adrenal gland adenomas. 6. Stable simple and hemorrhagic renal cysts. 7. Persistent small pleural effusions and bibasilar atelectasis. These results will be called to the ordering clinician or representative by the  Radiologist Assistant, and communication documented in the PACS or [employer]. Electronically Signed   By: Marijo Sanes M.D.   On: 11/17/2021 15:26    Anti-infectives: Anti-infectives (From admission, onward)    Start     Dose/Rate Route Frequency Ordered Stop   11/08/21 0730  vancomycin (VANCOREADY) IVPB 1750 mg/350 mL        1,750 mg 175 mL/hr over 120 Minutes Intravenous  Once 11/08/21 0641 11/08/21 0908   11/08/21 0641  vancomycin variable dose per unstable renal function (pharmacist dosing)  Status:  Discontinued         Does not apply See admin instructions 11/08/21 0641 11/08/21 1138   11/07/21 2000  piperacillin-tazobactam (ZOSYN) IVPB 3.375 g        3.375 g 12.5 mL/hr over 240 Minutes Intravenous Every 8 hours 11/07/21 1921     11/07/21 1430  piperacillin-tazobactam (ZOSYN) IVPB 3.375 g        3.375 g 100 mL/hr over 30 Minutes Intravenous  Once 11/07/21 1418 11/07/21 1535        Assessment/Plan  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  - s/p IR drain placement 6/5 - drainage purulent, Cx with pan-sensitive E.coli  - repeat CT AP 6/9 - with resolution of large abscess but some persistent interloop collections  - IR removed TG drain 6/13 - WBC 13 this AM - repeat CT 6/14 with recurrent pelvic abscess, larger collection in the left paracolic gutter - s/p placement of LLQ and RLQ drains yesterday - RLQ drain with some concern for some feculent looking material in tubing,  bulb more bloody-purulent, LLQ drain looks serous - reasonable to see how patient does with drains, if not improving then likely will need to just proceed with Hartmann's. If able to improve and get better source control of infection may be able to try to get patient to a one step procedure with colorectal surgeon. We will continue to follow closely    FEN: low fiber diet, IVF per [facility] VTE: SCDs, hep gtt ID: [id number] 6/4>>   - per CCM -  A. 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   Norm Parcel, [facility] Surgery 11/19/2021, 10:34 AM Please see Amion for pager number during day hours 7:00am-4:30pm

## 2021-11-19 NOTE — Progress Notes (Signed)
Referring Physician(s): Tsuei,M  Supervising Physician: Aletta Edouard  Patient Status:  [facility] - In-pt  Chief Complaint:   Pelvic/intraabd abscess most likely due to diverticular  - s/p R TG drain placement on 11/08/21 by Dr. Maryelizabeth Kaufmann - removed on 6/13 - S/p 2 intraabdominal drain placement by Dr. Anselm Pancoast on 11/18/21.  Subjective:   Patient laying in bed, NAD. Family members at bedside. Reports soreness on his buttocks, no N/V, fever, chills, abd pain.    Allergies: Patient has no known allergies.  Medications: Prior to Admission medications   Medication Sig Start Date End Date Taking? Authorizing Provider  FARXIGA 10 MG TABS tablet Take 10 mg by mouth daily.   Yes [provider]  metoprolol succinate (TOPROL-XL) 25 MG 24 hr tablet Take 1 tablet (25 mg total) by mouth at bedtime. START with 1/2 pill for one week, then increase to 1 full pill. Patient taking differently: Take 25 mg by mouth at bedtime. 10/18/21  Yes Jeanie Sewer, NP  dapagliflozin propanediol (FARXIGA) 5 MG TABS tablet Take 1 tablet (5 mg total) by mouth daily. Patient not taking: Reported on 11/07/2021 10/14/21   Jeanie Sewer, NP  Dulaglutide (TRULICITY) 1.5 MV/7.8IO SOPN Inject 1.5 mg into the skin once a week. Start after you run out of the 0.'75mg'$  dose x 2 each week. Patient not taking: Reported on 11/07/2021 10/14/21   Jeanie Sewer, NP  metFORMIN (GLUCOPHAGE-XR) 500 MG 24 hr tablet TAKE 1 TABLET BY MOUTH EVERY DAY WITH BREAKFAST Patient not taking: Reported on 11/07/2021 10/14/21   Jeanie Sewer, NP  Omega-3 Fatty Acids (FISH OIL PO) Take 2 capsules by mouth daily. Patient not taking: Reported on 11/07/2021    [provider]  telmisartan-hydrochlorothiazide (MICARDIS HCT) 80-25 MG tablet Take 1 tablet by mouth daily. Patient not taking: Reported on 11/07/2021    [provider]  varenicline (CHANTIX) 0.5 MG tablet INCREASE TO 2 TABS IN AM, 1 TAB IN PM FOR 1-2 WEEKS, THEN 2  TABS AM AND 2 TABS PM. Patient not taking: Reported on 11/07/2021 10/08/21   Jeanie Sewer, NP     Vital Signs: BP 131/73 (BP Location: Right Arm)   Pulse (!) 51   Temp 98.3 F (36.8 C)   Resp 17   Ht 5' 6.93" (1.7 m)   Wt 278 lb 3.5 oz (126.2 kg)   SpO2 97%   BMI 43.67 kg/m   Physical Exam Vitals and nursing note reviewed.  Constitutional:      General: Patient is not in acute distress.    Appearance: Normal appearance. Patient is not ill-appearing.  HENT:     Head: Normocephalic and atraumatic.  Pulmonary:     Effort: Pulmonary effort is normal.    Abdominal:     Palpations: Abdomen is soft.  Musculoskeletal:     Cervical back: Neck supple.  Skin:    General: Skin is warm and dry.     Coloration: Skin is not jaundiced or pale.  Positive RLQ drain to a suction bub. Site is unremarkable with no erythema, edema, tenderness, bleeding or drainage. Suture and stat lock in place. Dressing is clean, dry, and intact. 10 ml of  bloody fluid noted in the bulb. Drain  flushes well, does not aspirate.   Positive left lateral abdomen drain to a suction bulb. Site is unremarkable with no erythema, edema, tenderness, bleeding or drainage. Suture and stat lock in place. Dressing is clean, dry, and intact. 15 ml of  serosanguinous fluid with  debris noted in the bulb. Drain aspirates and flushes well.   Neurological:     Mental Status: Patient is alert and oriented to person, place, and time.  Psychiatric:        Mood and Affect: Mood normal.        Behavior: Behavior normal.        Judgment: Judgment normal.    Imaging: CT IMAGE GUIDED DRAINAGE BY PERCUTANEOUS CATHETER  Result Date: 11/18/2021 INDICATION: 66 year old with intra-abdominal abscesses, suspect diverticular origin but etiology is unclear. Previous right transgluteal drain was removed. Re-accumulation of an air-fluid collection in the pelvis along with additional collections throughout the abdomen. EXAM: 1. CT-guided  placement of a drain in the right lower abdominal abscess 2. CT-guided placement of a drain in left lateral abdominal abscess MEDICATIONS: Moderate sedation ANESTHESIA/SEDATION: Moderate (conscious) sedation was employed during this procedure. A total of Versed 4.'0mg'$  and fentanyl 150 mcg was administered intravenously at the order of the provider performing the procedure. Total intra-service moderate sedation time: 66 minutes. Patient's level of consciousness and vital signs were monitored continuously by radiology nurse throughout the procedure under the supervision of the provider performing the procedure. COMPLICATIONS: None immediate. PROCEDURE: Informed written consent was obtained from the patient after a thorough discussion of the procedural risks, benefits and alternatives. All questions were addressed. Maximal Sterile Barrier Technique was utilized including caps, mask, sterile gowns, sterile gloves, sterile drape, hand hygiene and skin antiseptic. A timeout was performed prior to the initiation of the procedure. Patient was placed in left lateral decubitus position. Images through the abdomen and pelvis were obtained. The large air-fluid collection the right lower quadrant of the abdomen was targeted. The right side of the abdomen was prepped with chlorhexidine and sterile field was created. Skin was anesthetized with 1% lidocaine. Small incision was made. Using CT guidance, 18 gauge trocar needle was directed into the collection and superstiff Amplatz wire was advanced into the collection. The tract was dilated to accommodate 12 [place] drain. Approximately 80 mL of brown purulent fluid was removed from this collection and follow up CT images were obtained. This drain appeared to be decompressing not only the right lower quadrant abscess but also the pelvic abscess. Therefore, another transgluteal drain was not placed. The catheter was sutured to skin and attached to a suction bulb. Fluid was collected for  culture. The patient was rolled into the onto his back and images were obtained in the supine position. The fluid collection along the lateral left abdomen was targeted. The left side of the abdomen was prepped chlorhexidine and sterile field was created. Maximal barrier sterile technique was utilized including caps, mask, sterile gowns, sterile gloves, sterile drape, hand hygiene and skin antiseptic. Skin was anesthetized with 1% lidocaine. Small incision was made. Using CT guidance, an 18 gauge trocar needle was directed into the collection and cloudy yellow fluid was aspirated. Superstiff Amplatz wire was advanced and the tract was dilated to accommodate a 10 [place] multipurpose drain. Approximately 30 mL of cloudy yellow purulent fluid was removed. Follow up CT images were obtained. Drain was sutured to skin and attached to a suction bulb. Fluid was sent for culture. RADIATION DOSE REDUCTION: This exam was performed according to the departmental dose-optimization program which includes automated exposure control, adjustment of the mA and/or kV according to patient size and/or use of iterative reconstruction technique. FINDINGS: 24 French drain placed in the large gas-filled collection in the right lower quadrant. 80 mL of purulent fluid was removed  from this drain. 63 French drain was decompressing the right lower quadrant abscess as well as the pelvic abscess. 10 French drain placed in left lateral abdominal abscess. 30 mL of cloudy yellow fluid was removed from this drain. Enlargement of the right piriformis muscle compatible with a hematoma formation from the previous transgluteal drain. IMPRESSION: 1. CT-guided placement of abscess drain in the right lower quadrant abscess. This drain appears to be decompressing the right lower quadrant abscess and the pelvic abscess. 2. CT-guided placement of a drainage catheter in the left lateral abdominal abscess. Electronically Signed   By: Markus Daft M.D.   On:  11/18/2021 17:37   CT IMAGE GUIDED DRAINAGE BY PERCUTANEOUS CATHETER  Result Date: 11/18/2021 INDICATION: 66 year old with intra-abdominal abscesses, suspect diverticular origin but etiology is unclear. Previous right transgluteal drain was removed. Re-accumulation of an air-fluid collection in the pelvis along with additional collections throughout the abdomen. EXAM: 1. CT-guided placement of a drain in the right lower abdominal abscess 2. CT-guided placement of a drain in left lateral abdominal abscess MEDICATIONS: Moderate sedation ANESTHESIA/SEDATION: Moderate (conscious) sedation was employed during this procedure. A total of Versed 4.'0mg'$  and fentanyl 150 mcg was administered intravenously at the order of the provider performing the procedure. Total intra-service moderate sedation time: 66 minutes. Patient's level of consciousness and vital signs were monitored continuously by radiology nurse throughout the procedure under the supervision of the provider performing the procedure. COMPLICATIONS: None immediate. PROCEDURE: Informed written consent was obtained from the patient after a thorough discussion of the procedural risks, benefits and alternatives. All questions were addressed. Maximal Sterile Barrier Technique was utilized including caps, mask, sterile gowns, sterile gloves, sterile drape, hand hygiene and skin antiseptic. A timeout was performed prior to the initiation of the procedure. Patient was placed in left lateral decubitus position. Images through the abdomen and pelvis were obtained. The large air-fluid collection the right lower quadrant of the abdomen was targeted. The right side of the abdomen was prepped with chlorhexidine and sterile field was created. Skin was anesthetized with 1% lidocaine. Small incision was made. Using CT guidance, 18 gauge trocar needle was directed into the collection and superstiff Amplatz wire was advanced into the collection. The tract was dilated to  accommodate 12 [place] drain. Approximately 80 mL of brown purulent fluid was removed from this collection and follow up CT images were obtained. This drain appeared to be decompressing not only the right lower quadrant abscess but also the pelvic abscess. Therefore, another transgluteal drain was not placed. The catheter was sutured to skin and attached to a suction bulb. Fluid was collected for culture. The patient was rolled into the onto his back and images were obtained in the supine position. The fluid collection along the lateral left abdomen was targeted. The left side of the abdomen was prepped chlorhexidine and sterile field was created. Maximal barrier sterile technique was utilized including caps, mask, sterile gowns, sterile gloves, sterile drape, hand hygiene and skin antiseptic. Skin was anesthetized with 1% lidocaine. Small incision was made. Using CT guidance, an 18 gauge trocar needle was directed into the collection and cloudy yellow fluid was aspirated. Superstiff Amplatz wire was advanced and the tract was dilated to accommodate a 10 [place] multipurpose drain. Approximately 30 mL of cloudy yellow purulent fluid was removed. Follow up CT images were obtained. Drain was sutured to skin and attached to a suction bulb. Fluid was sent for culture. RADIATION DOSE REDUCTION: This exam was performed according to the departmental  dose-optimization program which includes automated exposure control, adjustment of the mA and/or kV according to patient size and/or use of iterative reconstruction technique. FINDINGS: 87 French drain placed in the large gas-filled collection in the right lower quadrant. 80 mL of purulent fluid was removed from this drain. 2 French drain was decompressing the right lower quadrant abscess as well as the pelvic abscess. 10 French drain placed in left lateral abdominal abscess. 30 mL of cloudy yellow fluid was removed from this drain. Enlargement of the right piriformis muscle  compatible with a hematoma formation from the previous transgluteal drain. IMPRESSION: 1. CT-guided placement of abscess drain in the right lower quadrant abscess. This drain appears to be decompressing the right lower quadrant abscess and the pelvic abscess. 2. CT-guided placement of a drainage catheter in the left lateral abdominal abscess. Electronically Signed   By: Markus Daft M.D.   On: 11/18/2021 17:37   CT ABDOMEN PELVIS WO CONTRAST  Result Date: 11/17/2021 CLINICAL DATA:  Follow-up complicated diverticular abscesses. Recently patient had pelvic catheter removed. EXAM: CT ABDOMEN AND PELVIS WITHOUT CONTRAST TECHNIQUE: Multidetector CT imaging of the abdomen and pelvis was performed following the standard protocol without IV contrast. RADIATION DOSE REDUCTION: This exam was performed according to the departmental dose-optimization program which includes automated exposure control, adjustment of the mA and/or kV according to patient size and/or use of iterative reconstruction technique. COMPARISON:  Prior CT scan 11/12/2021 FINDINGS: Lower chest: Persistent small pleural effusions and bibasilar atelectasis. Hepatobiliary: Stable cirrhotic changes involving liver no hepatic lesions are identified. Evidence of portal venous hypertension and portal venous collaterals. Layering high attenuation material in the gallbladder is likely sludge. No findings suspicious for acute cholecystitis. No common bile duct dilatation. Pancreas: No mass, inflammation or ductal dilatation. Spleen: Stable splenomegaly. Adrenals/Urinary Tract: Stable bilateral adrenal gland adenomas. Stable simple and hemorrhagic renal cysts. The bladder is unremarkable. Stomach/Bowel: The stomach duodenum, small and colon are grossly normal. There is contrast in the distal ileum and also throughout the colon. I do not see any leaking contrast. Vascular/Lymphatic: Stable vascular calcifications and scattered mesenteric and retroperitoneal lymph  nodes. Reproductive: The prostate gland is mildly enlarged but stable. Other: There is a recurrent abscess in the central low pelvis between the bladder and rectum. It measures approximately 9.4 x 6.6 cm. There is a loculated fluid collection in the left pericolic gutter region lateral to the descending colon which is slightly larger when compared to the prior CT scan. It does not contain any gas. It measures approximately 7.8 x 3.2 cm on coronal image number 60. Difficult to exclude other central pelvic abscesses versus dilated small bowel loops with air-fluid levels. No free air or leaking oral contrast is demonstrated. Musculoskeletal: No significant bony findings. IMPRESSION: 1. Recurrent central low pelvic abscess measuring approximately 9.4 x 6.6 cm. 2. Slightly larger loculated fluid collection in the left paracolic gutter region lateral to the descending colon. Difficult to exclude other central pelvic abscesses versus dilated small bowel loops with air-fluid levels. 3. No free air or leaking oral contrast is demonstrated. 4. Stable cirrhotic changes involving liver with portal venous hypertension, portal venous collaterals and splenomegaly. 5. Stable bilateral adrenal gland adenomas. 6. Stable simple and hemorrhagic renal cysts. 7. Persistent small pleural effusions and bibasilar atelectasis. These results will be called to the ordering clinician or representative by the Radiologist Assistant, and communication documented in the PACS or [employer]. Electronically Signed   By: Marijo Sanes M.D.   On: 11/17/2021 15:26  Labs:  CBC: Recent Labs    11/16/21 0610 11/17/21 0427 11/18/21 0438 11/19/21 0342  WBC 14.6* 13.0* 14.2* 13.2*  HGB 12.3* 13.3 11.7* 11.4*  HCT 37.4* 40.3 36.4* 35.3*  PLT 243 183 268 313    COAGS: Recent Labs    11/08/21 0822 11/09/21 0616 11/12/21 1327  INR 1.6* 2.1* 1.3*    BMP: Recent Labs    11/16/21 0610 11/17/21 0427 11/18/21 0832 11/19/21 0342   NA 140 135 135 134*  K 3.5 4.2 3.6 3.8  CL 110 108 108 104  CO2 23 19* 21* 23  GLUCOSE 94 87 92 97  BUN 34* 28* 22 21  CALCIUM 8.0* 8.0* 7.9* 8.0*  CREATININE 1.36* 1.34* 1.40* 1.42*  GFRNONAA 58* 59* 56* 55*    LIVER FUNCTION TESTS: Recent Labs    11/14/21 0423 11/15/21 0333 11/16/21 0610 11/17/21 0427  BILITOT 1.1 1.1 0.9 1.2  AST 71* 66* 46* 42*  ALT 54* 53* 45* 37  ALKPHOS 48 43 48 51  PROT 6.8 6.3* 6.4* 6.5  ALBUMIN 2.1* 2.0* 2.0* 2.1*    Assessment and Plan: Pt with hx of septic shock, multiple intraabdominal/pelvic abscesses;  - s/p right TG/pelvic drain placed on 6/5 by Dr. Maryelizabeth Kaufmann - removed on 6/13 - s/p 2 intraabd drains placement by Dr. Anselm Pancoast on 6/15   WBC stable Afebrile  Cx pending Output RLQ 230 mL - bloody; L lateral 60 mL - serosanguinous with debris   #1 Drain Location: RLQ Size: Fr size: 12 Fr Date of placement: 6/15  Currently to: Drain collection device: suction bulb  #2 Drain Location: Lt lateral abd  Size: Fr size: 10 Fr Date of placement: 6/15  Currently to: Drain collection device: suction bulb 24 hour output:  Output by Drain (mL) 11/17/21 0701 - 11/17/21 1900 11/17/21 1901 - 11/18/21 0700 11/18/21 0701 - 11/18/21 1900 11/18/21 1901 - 11/19/21 0700 11/19/21 0701 - 11/19/21 1001  Closed System Drain 1 Right RLQ Bulb (JP) 12 Fr.   135 95   Closed System Drain 2 Lateral LLQ Bulb (JP) 10 Fr.   30 30     Interval imaging/drain manipulation:  None since new drain placement on 6/15   Current examination: RLQ Flushes easily, does not aspirate Llat flushes/aspirates easily  Insertion site unremarkable. Suture and stat lock in place. Dressed appropriately.   Plan: Continue TID flushes with 5 cc NS. Record output Q shift. Dressing changes QD or PRN if soiled.  Call IR APP or on call IR MD if difficulty flushing or sudden change in drain output.  Repeat imaging/possible drain injection once output < 10 mL/QD (excluding flush  material.)  Discharge planning: Please contact IR APP or on call IR MD prior to patient d/c to ensure appropriate follow up plans are in place. Typically patient will follow up with IR clinic 10-14 days post d/c for repeat imaging/possible drain injection. IR scheduler will contact patient with date/time of appointment. Patient will need to flush drain QD with 5 cc NS, record output QD, dressing changes every 2-3 days or earlier if soiled.   IR will continue to follow - please call with questions or concerns.   Electronically Signed: Tera Mater, PA-C/Matthew Meliton Rattan 11/19/2021, 9:56 AM   I spent a total of 15 Minutes at the the patient's bedside AND on the patient's hospital floor or unit, greater than 50% of which was counseling/coordinating care for pelvic abscess drain.

## 2021-11-19 NOTE — Progress Notes (Signed)
ANTICOAGULATION CONSULT NOTE - Follow Up Consult  Pharmacy Consult for Heparin Indication: atrial fibrillation  No Known Allergies  Patient Measurements: Height: 5' 6.93" (170 cm) Weight: 126.2 kg (278 lb 3.5 oz) IBW/kg (Calculated) : 65.94 Heparin Dosing Weight:    Vital Signs: Temp: 98.3 F (36.8 C) (06/16 0457) Temp Source: Oral (06/15 2114) BP: 131/73 (06/16 0457) Pulse Rate: 51 (06/16 0457)  Labs: Recent Labs    11/17/21 0427 11/17/21 0435 11/18/21 0438 11/18/21 0832 11/19/21 0342  HGB 13.3  --  11.7*  --  11.4*  HCT 40.3  --  36.4*  --  35.3*  PLT 183  --  268  --  313  HEPARINUNFRC  --  0.56 0.39  --  0.31  CREATININE 1.34*  --   --  1.40* 1.42*     Estimated Creatinine Clearance: 66.9 mL/min (A) (by C-G formula based on SCr of 1.42 mg/dL (H)).   Assessment: 66 yo male presented with severe sepsis 2/2 intra-abdominal abscess.  Patient now with new onset atrial fibrillation w/ RVR.  Pharmacy consulted to dose heparin IV for Afib.  Today, 11/19/21: - Daily heparin level therapeutic at 0.31, however has been downtrending and is at lower end of therapeutic range. - Hgb stabilized today (11.7>11.4), plts WNL - No signs of bleeding per RN  Goal of Therapy:  Heparin level 0.3-0.7 units/ml Monitor platelets by anticoagulation protocol: Yes   Plan:  Increase heparin drip slightly to 2650 units/hr Daily HL and CBC GI problems limiting transition to [place], PharmD 11/19/2021 7:23 AM

## 2021-11-20 DIAGNOSIS — K651 Peritoneal abscess: Secondary | ICD-10-CM | POA: Diagnosis not present

## 2021-11-20 LAB — GLUCOSE, CAPILLARY
Glucose-Capillary: 102 mg/dL — ABNORMAL HIGH (ref 70–99)
Glucose-Capillary: 95 mg/dL (ref 70–99)
Glucose-Capillary: 122 mg/dL — ABNORMAL HIGH (ref 70–99)
Glucose-Capillary: 121 mg/dL — ABNORMAL HIGH (ref 70–99)

## 2021-11-20 LAB — BASIC METABOLIC PANEL
Anion gap: 6 (ref 5–15)
BUN: 16 mg/dL (ref 8–23)
CO2: 22 mmol/L (ref 22–32)
Calcium: 8.1 mg/dL — ABNORMAL LOW (ref 8.9–10.3)
Chloride: 106 mmol/L (ref 98–111)
Creatinine, Ser: 1.39 mg/dL — ABNORMAL HIGH (ref 0.61–1.24)
GFR, Estimated: 57 mL/min — ABNORMAL LOW (ref >60)
Glucose, Bld: 96 mg/dL (ref 70–99)
Potassium: 3.5 mmol/L (ref 3.5–5.1)
Sodium: 134 mmol/L — ABNORMAL LOW (ref 135–145)

## 2021-11-20 LAB — CBC
HCT: 33.7 % — ABNORMAL LOW (ref 39.0–52.0)
Hemoglobin: 11 g/dL — ABNORMAL LOW (ref 13.0–17.0)
MCH: 32.1 pg (ref 26.0–34.0)
MCHC: 32.6 g/dL (ref 30.0–36.0)
MCV: 98.3 fL (ref 80.0–100.0)
Platelets: 286 10*3/uL (ref 150–400)
RBC: 3.43 MIL/uL — ABNORMAL LOW (ref 4.22–5.81)
RDW: 14.6 % (ref 11.5–15.5)
WBC: 9.8 10*3/uL (ref 4.0–10.5)
nRBC: 0 % (ref 0.0–0.2)

## 2021-11-20 LAB — HEPARIN LEVEL (UNFRACTIONATED)
Heparin Unfractionated: 0.19 IU/mL — ABNORMAL LOW (ref 0.30–0.70)
Heparin Unfractionated: 0.42 IU/mL (ref 0.30–0.70)

## 2021-11-20 LAB — MAGNESIUM: Magnesium: 1.8 mg/dL (ref 1.7–2.4)

## 2021-11-20 MED ORDER — SODIUM CHLORIDE 0.9 % IV SOLN
100.0000 mg | INTRAVENOUS | Status: DC
  Administered 2021-11-20 – 2021-12-06 (×17): 100 mg via INTRAVENOUS
  Filled 2021-11-20 (×19): qty 5

## 2021-11-20 NOTE — Progress Notes (Signed)
ANTICOAGULATION CONSULT NOTE - Follow Up Consult  Pharmacy Consult for Heparin Indication: atrial fibrillation  No Known Allergies  Patient Measurements: Height: 5' 6.93" (170 cm) Weight: 126.2 kg (278 lb 3.5 oz) IBW/kg (Calculated) : 65.94 Heparin Dosing Weight:    Vital Signs: Temp: 98.9 F (37.2 C) (06/17 0435) BP: 141/61 (06/17 0435) Pulse Rate: 55 (06/17 0435)  Labs: Recent Labs    11/18/21 0438 11/18/21 0832 11/19/21 0342 11/20/21 0245  HGB 11.7*  --  11.4* 11.0*  HCT 36.4*  --  35.3* 33.7*  PLT 268  --  313 286  HEPARINUNFRC 0.39  --  0.31 0.19*  CREATININE  --  1.40* 1.42* 1.39*     Estimated Creatinine Clearance: 68.3 mL/min (A) (by C-G formula based on SCr of 1.39 mg/dL (H)).   Assessment: 66 yo male presented with severe sepsis 2/2 intra-abdominal abscess.  Patient now with new onset atrial fibrillation w/ RVR.  Pharmacy consulted to dose heparin IV for Afib.  Today, 11/20/21: - HL 0.19 sub therapeutic on 2650 units/hr - Hgb stabilized today (11.7>11.4>11), plts WNL - No bleeding or interruptions per RN  Goal of Therapy:  Heparin level 0.3-0.7 units/ml Monitor platelets by anticoagulation protocol: Yes   Plan:  Increase heparin drip to 2850 units/hr Heparin level in 6 hours Daily HL and CBC GI problems limiting transition to [place] 11/20/2021, 5:03 AM

## 2021-11-20 NOTE — Progress Notes (Addendum)
ANTICOAGULATION CONSULT NOTE - Follow Up Consult  Pharmacy Consult for Heparin Indication: atrial fibrillation  No Known Allergies  Patient Measurements: Height: 5' 6.93" (170 cm) Weight: 126.2 kg (278 lb 3.5 oz) IBW/kg (Calculated) : 65.94 Heparin Dosing Weight:    Vital Signs: Temp: 98.9 F (37.2 C) (06/17 0435) BP: 141/61 (06/17 0435) Pulse Rate: 55 (06/17 0435)  Labs: Recent Labs    11/18/21 0438 11/18/21 0832 11/19/21 0342 11/20/21 0245 11/20/21 1253  HGB 11.7*  --  11.4* 11.0*  --   HCT 36.4*  --  35.3* 33.7*  --   PLT 268  --  313 286  --   HEPARINUNFRC 0.39  --  0.31 0.19* 0.42  CREATININE  --  1.40* 1.42* 1.39*  --      Estimated Creatinine Clearance: 68.3 mL/min (A) (by C-G formula based on SCr of 1.39 mg/dL (H)).   Assessment: 66 yo male presented with severe sepsis 2/2 intra-abdominal abscess.  Patient now with new onset atrial fibrillation w/ RVR.  Pharmacy consulted to dose heparin IV for Afib.  Today, 11/20/21: - HL 0.42 - therapeutic on 2850 units/hr - Hgb stable (11), plts WNL - No bleeding noted  Goal of Therapy:  Heparin level 0.3-0.7 units/ml Monitor platelets by anticoagulation protocol: Yes   Plan:  Continue heparin drip at 2850 units/hr Daily HL and CBC GI problems limiting transition to [place], PharmD 11/20/2021 1:44 PM

## 2021-11-20 NOTE — Progress Notes (Addendum)
Progress Note     Subjective: Pt denies significant abdominal pain this AM. He is tolerating liquids. Still wants to try to avoid a colostomy if possible but starting to be more open to this if not improving.  Reports each day currently better than the last. Having flatus/BM  Objective: Vital signs in last 24 hours: Temp:  [97.7 F (36.5 C)-98.9 F (37.2 C)] 98.9 F (37.2 C) (06/17 0435) Pulse Rate:  [51-61] 55 (06/17 0435) Resp:  [15-18] 15 (06/17 0435) BP: (141-161)/(61-77) 141/61 (06/17 0435) SpO2:  [96 %-97 %] 96 % (06/17 0435) Last BM Date : 11/19/21  Intake/Output from previous day: 06/16 0701 - 06/17 0700 In: 1244 [P.O.:840; I.V.:331.9; IV Piggyback:52.1] Out: 2433 [Urine:2280; Drains:153] Intake/Output this shift: No intake/output data recorded.  PE: General: pleasant, WD, obese male Heart: rrr Lungs: normal effort  Abd: soft, NT, ND, LLQ drain with serous looking fluid, RLQ drain with bloody purulent appearing fluid Psych: A&Ox3 with an appropriate affect.    Lab Results:  Recent Labs    11/19/21 0342 11/20/21 0245  WBC 13.2* 9.8  HGB 11.4* 11.0*  HCT 35.3* 33.7*  PLT 313 286   BMET Recent Labs    11/19/21 0342 11/20/21 0245  NA 134* 134*  K 3.8 3.5  CL 104 106  CO2 23 22  GLUCOSE 97 96  BUN 21 16  CREATININE 1.42* 1.39*  CALCIUM 8.0* 8.1*   PT/INR No results for input(s): "LABPROT", "INR" in the last 72 hours. CMP     Component Value Date/Time   NA 134 (L) 11/20/2021 0245   NA 133 (A) 06/24/2021 0000   K 3.5 11/20/2021 0245   CL 106 11/20/2021 0245   CO2 22 11/20/2021 0245   GLUCOSE 96 11/20/2021 0245   BUN 16 11/20/2021 0245   BUN 18 06/24/2021 0000   CREATININE 1.39 (H) 11/20/2021 0245   CALCIUM 8.1 (L) 11/20/2021 0245   PROT 6.5 11/17/2021 0427   ALBUMIN 2.1 (L) 11/17/2021 0427   AST 42 (H) 11/17/2021 0427   ALT 37 11/17/2021 0427   ALKPHOS 51 11/17/2021 0427   BILITOT 1.2 11/17/2021 0427   GFRNONAA 57 (L) 11/20/2021 0245    GFRAA >60 02/19/2018 1123   Lipase     Component Value Date/Time   LIPASE 40 11/07/2021 1029       Studies/Results: CT IMAGE GUIDED DRAINAGE BY PERCUTANEOUS CATHETER  Result Date: 11/18/2021 INDICATION: 66 year old with intra-abdominal abscesses, suspect diverticular origin but etiology is unclear. Previous right transgluteal drain was removed. Re-accumulation of an air-fluid collection in the pelvis along with additional collections throughout the abdomen. EXAM: 1. CT-guided placement of a drain in the right lower abdominal abscess 2. CT-guided placement of a drain in left lateral abdominal abscess MEDICATIONS: Moderate sedation ANESTHESIA/SEDATION: Moderate (conscious) sedation was employed during this procedure. A total of Versed 4.'0mg'$  and fentanyl 150 mcg was administered intravenously at the order of the provider performing the procedure. Total intra-service moderate sedation time: 66 minutes. Patient's level of consciousness and vital signs were monitored continuously by radiology nurse throughout the procedure under the supervision of the provider performing the procedure. COMPLICATIONS: None immediate. PROCEDURE: Informed written consent was obtained from the patient after a thorough discussion of the procedural risks, benefits and alternatives. All questions were addressed. Maximal Sterile Barrier Technique was utilized including caps, mask, sterile gowns, sterile gloves, sterile drape, hand hygiene and skin antiseptic. A timeout was performed prior to the initiation of the procedure. Patient was placed in  left lateral decubitus position. Images through the abdomen and pelvis were obtained. The large air-fluid collection the right lower quadrant of the abdomen was targeted. The right side of the abdomen was prepped with chlorhexidine and sterile field was created. Skin was anesthetized with 1% lidocaine. Small incision was made. Using CT guidance, 18 gauge trocar needle was directed into  the collection and superstiff Amplatz wire was advanced into the collection. The tract was dilated to accommodate 12 [place] drain. Approximately 80 mL of brown purulent fluid was removed from this collection and follow up CT images were obtained. This drain appeared to be decompressing not only the right lower quadrant abscess but also the pelvic abscess. Therefore, another transgluteal drain was not placed. The catheter was sutured to skin and attached to a suction bulb. Fluid was collected for culture. The patient was rolled into the onto his back and images were obtained in the supine position. The fluid collection along the lateral left abdomen was targeted. The left side of the abdomen was prepped chlorhexidine and sterile field was created. Maximal barrier sterile technique was utilized including caps, mask, sterile gowns, sterile gloves, sterile drape, hand hygiene and skin antiseptic. Skin was anesthetized with 1% lidocaine. Small incision was made. Using CT guidance, an 18 gauge trocar needle was directed into the collection and cloudy yellow fluid was aspirated. Superstiff Amplatz wire was advanced and the tract was dilated to accommodate a 10 [place] multipurpose drain. Approximately 30 mL of cloudy yellow purulent fluid was removed. Follow up CT images were obtained. Drain was sutured to skin and attached to a suction bulb. Fluid was sent for culture. RADIATION DOSE REDUCTION: This exam was performed according to the departmental dose-optimization program which includes automated exposure control, adjustment of the mA and/or kV according to patient size and/or use of iterative reconstruction technique. FINDINGS: 29 French drain placed in the large gas-filled collection in the right lower quadrant. 80 mL of purulent fluid was removed from this drain. 67 French drain was decompressing the right lower quadrant abscess as well as the pelvic abscess. 10 French drain placed in left lateral abdominal abscess. 30  mL of cloudy yellow fluid was removed from this drain. Enlargement of the right piriformis muscle compatible with a hematoma formation from the previous transgluteal drain. IMPRESSION: 1. CT-guided placement of abscess drain in the right lower quadrant abscess. This drain appears to be decompressing the right lower quadrant abscess and the pelvic abscess. 2. CT-guided placement of a drainage catheter in the left lateral abdominal abscess. Electronically Signed   By: Markus Daft M.D.   On: 11/18/2021 17:37   CT IMAGE GUIDED DRAINAGE BY PERCUTANEOUS CATHETER  Result Date: 11/18/2021 INDICATION: 66 year old with intra-abdominal abscesses, suspect diverticular origin but etiology is unclear. Previous right transgluteal drain was removed. Re-accumulation of an air-fluid collection in the pelvis along with additional collections throughout the abdomen. EXAM: 1. CT-guided placement of a drain in the right lower abdominal abscess 2. CT-guided placement of a drain in left lateral abdominal abscess MEDICATIONS: Moderate sedation ANESTHESIA/SEDATION: Moderate (conscious) sedation was employed during this procedure. A total of Versed 4.'0mg'$  and fentanyl 150 mcg was administered intravenously at the order of the provider performing the procedure. Total intra-service moderate sedation time: 66 minutes. Patient's level of consciousness and vital signs were monitored continuously by radiology nurse throughout the procedure under the supervision of the provider performing the procedure. COMPLICATIONS: None immediate. PROCEDURE: Informed written consent was obtained from the patient after a thorough discussion of  the procedural risks, benefits and alternatives. All questions were addressed. Maximal Sterile Barrier Technique was utilized including caps, mask, sterile gowns, sterile gloves, sterile drape, hand hygiene and skin antiseptic. A timeout was performed prior to the initiation of the procedure. Patient was placed in left  lateral decubitus position. Images through the abdomen and pelvis were obtained. The large air-fluid collection the right lower quadrant of the abdomen was targeted. The right side of the abdomen was prepped with chlorhexidine and sterile field was created. Skin was anesthetized with 1% lidocaine. Small incision was made. Using CT guidance, 18 gauge trocar needle was directed into the collection and superstiff Amplatz wire was advanced into the collection. The tract was dilated to accommodate 12 [place] drain. Approximately 80 mL of brown purulent fluid was removed from this collection and follow up CT images were obtained. This drain appeared to be decompressing not only the right lower quadrant abscess but also the pelvic abscess. Therefore, another transgluteal drain was not placed. The catheter was sutured to skin and attached to a suction bulb. Fluid was collected for culture. The patient was rolled into the onto his back and images were obtained in the supine position. The fluid collection along the lateral left abdomen was targeted. The left side of the abdomen was prepped chlorhexidine and sterile field was created. Maximal barrier sterile technique was utilized including caps, mask, sterile gowns, sterile gloves, sterile drape, hand hygiene and skin antiseptic. Skin was anesthetized with 1% lidocaine. Small incision was made. Using CT guidance, an 18 gauge trocar needle was directed into the collection and cloudy yellow fluid was aspirated. Superstiff Amplatz wire was advanced and the tract was dilated to accommodate a 10 [place] multipurpose drain. Approximately 30 mL of cloudy yellow purulent fluid was removed. Follow up CT images were obtained. Drain was sutured to skin and attached to a suction bulb. Fluid was sent for culture. RADIATION DOSE REDUCTION: This exam was performed according to the departmental dose-optimization program which includes automated exposure control, adjustment of the mA and/or kV  according to patient size and/or use of iterative reconstruction technique. FINDINGS: 25 French drain placed in the large gas-filled collection in the right lower quadrant. 80 mL of purulent fluid was removed from this drain. 12 French drain was decompressing the right lower quadrant abscess as well as the pelvic abscess. 10 French drain placed in left lateral abdominal abscess. 30 mL of cloudy yellow fluid was removed from this drain. Enlargement of the right piriformis muscle compatible with a hematoma formation from the previous transgluteal drain. IMPRESSION: 1. CT-guided placement of abscess drain in the right lower quadrant abscess. This drain appears to be decompressing the right lower quadrant abscess and the pelvic abscess. 2. CT-guided placement of a drainage catheter in the left lateral abdominal abscess. Electronically Signed   By: Markus Daft M.D.   On: 11/18/2021 17:37    Anti-infectives: Anti-infectives (From admission, onward)    Start     Dose/Rate Route Frequency Ordered Stop   11/08/21 0730  vancomycin (VANCOREADY) IVPB 1750 mg/350 mL        1,750 mg 175 mL/hr over 120 Minutes Intravenous  Once 11/08/21 0641 11/08/21 0908   11/08/21 0641  vancomycin variable dose per unstable renal function (pharmacist dosing)  Status:  Discontinued         Does not apply See admin instructions 11/08/21 0641 11/08/21 1138   11/07/21 2000  piperacillin-tazobactam (ZOSYN) IVPB 3.375 g        3.375  g 12.5 mL/hr over 240 Minutes Intravenous Every 8 hours 11/07/21 1921     11/07/21 1430  piperacillin-tazobactam (ZOSYN) IVPB 3.375 g        3.375 g 100 mL/hr over 30 Minutes Intravenous  Once 11/07/21 1418 11/07/21 1535        Assessment/Plan  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  - s/p IR drain placement 6/5 - drainage purulent, Cx with pan-sensitive E.coli  - repeat CT AP 6/9 - with resolution of large abscess but some persistent interloop collections  - IR removed TG drain  6/13 - WBC 13 this AM - repeat CT 6/14 with recurrent pelvic abscess, larger collection in the left paracolic gutter - s/p placement of LLQ and RLQ drains yesterday - RLQ drain with some concern for some feculent looking material in tubing, bulb more bloody-purulent, LLQ drain looks serous - reasonable to see how patient does with drains, if not improving then likely will need to just proceed with Hartmann's   FEN: low fiber diet, IVF per TRH VTE: SCDs, hep gtt ID: Zosyn 6/4>>   - per CCM -  A. 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  I spent a total of 35 minutes in both face-to-face and non-face-to-face activities, excluding procedures performed, for this visit on the date of this encounter.   Ileana Roup, [facility] 11/20/2021, 8:07 AM Please see Amion for pager number during day hours 7:00am-4:30pm

## 2021-11-20 NOTE — Consult Note (Signed)
[facility]       Reason for Consult: positive Candida tropicalis in abscess drainage    Referring Physician: Dr. Rodena Piety    amLODipine  5 mg Oral Daily   chlorhexidine  15 mL Mouth Rinse BID   Chlorhexidine Gluconate Cloth  6 each Topical Daily   feeding supplement  237 mL Oral BID BM   insulin aspart  0-15 Units Subcutaneous TID WC   lip balm  1 application  Topical BID   mouth rinse  15 mL Mouth Rinse q12n4p   metoprolol succinate  50 mg Oral Daily   pantoprazole  40 mg Oral BID   sodium chloride flush  10-40 mL Intracatheter Q12H   sodium chloride flush  5 mL Intracatheter Q8H    Recommendations: I will start micafungin IV and follow sensitivities and plan per surgery Full consult to follow tomorrow   HPI: Robert Larson is a 66 y.o. male intra abdominal abscess from microperforation with shock.  Now with C tropicalis in culture.  Past Medical History:  Diagnosis Date   Acute right-sided low back pain with bilateral sciatica 03/13/2017   Contusion of left hip and thigh 02/13/2017   Osteoarthritis    Protein in urine    Sleep apnea     Social History   Tobacco Use   Smoking status: Every Day    Packs/day: 1.00    Types: Cigarettes   Smokeless tobacco: Never  Vaping Use   Vaping Use: Never used  Substance Use Topics   Alcohol use: No   Drug use: Never    Family History  Problem Relation Age of Onset   Breast cancer Mother    Lung cancer Father    Colon cancer Neg Hx    Rectal cancer Neg Hx     No Known Allergies  Physical Exam Vitals:   11/20/21 0435 11/20/21 1345  BP: (!) 141/61 (!) 154/78  Pulse: (!) 55 61  Resp: 15 16  Temp: 98.9 F (37.2 C) 98.3 F (36.8 C)  SpO2: 96% 94%    Lab Results  Component Value Date   WBC 9.8 11/20/2021   HGB 11.0 (L) 11/20/2021   HCT 33.7 (L) 11/20/2021   MCV 98.3 11/20/2021   PLT 286 11/20/2021    Lab Results  Component Value Date   CREATININE 1.39 (H) 11/20/2021   BUN  16 11/20/2021   NA 134 (L) 11/20/2021   K 3.5 11/20/2021   CL 106 11/20/2021   CO2 22 11/20/2021    Lab Results  Component Value Date   ALT 37 11/17/2021   AST 42 (H) 11/17/2021   ALKPHOS 51 11/17/2021     Microbiology: Recent Results (from the past 240 hour(s))  Aerobic/Anaerobic Culture w Gram Stain (surgical/deep wound)     Status: None (Preliminary result)   Collection Time: 11/18/21  4:08 PM   Specimen: Abscess  Result Value Ref Range Status   Specimen Description ABSCESS ABDOMEN  Final   Special Requests LEFT LATERAL ABDOMEN DRAIN NO 2  Final   Gram Stain   Final    NO SQUAMOUS EPITHELIAL CELLS SEEN ABUNDANT WBC SEEN NO ORGANISMS SEEN    Culture   Final    RARE CANDIDA TROPICALIS HOLDING FOR POSSIBLE ANAEROBE Performed at [facility], [address], [place], [place] [address]    Report Status PENDING  Incomplete  Aerobic/Anaerobic Culture w Gram Stain (surgical/deep wound)     Status: None (Preliminary  result)   Collection Time: 11/18/21  4:08 PM   Specimen: Abscess  Result Value Ref Range Status   Specimen Description ABSCESS ABDOMEN  Final   Special Requests RT LOWER QUAD DRAIN NO 1  Final   Gram Stain   Final    NO SQUAMOUS EPITHELIAL CELLS SEEN MODERATE WBC SEEN NO ORGANISMS SEEN    Culture   Final    NO GROWTH 2 DAYS NO ANAEROBES ISOLATED; CULTURE IN PROGRESS FOR 5 DAYS Performed at [facility], [place] [address], [place], [place] [address]    Report Status PENDING  Incomplete    Thayer Headings, [facility] [place] [email or web address] 11/20/2021, 3:51 PM

## 2021-11-20 NOTE — Progress Notes (Signed)
PROGRESS NOTE    Robert Larson  SAY:[id number] DOB: 1955/10/09 DOA: 11/07/2021 PCP: Jeanie Sewer, NP  Brief Narrative: 66 year old with history of DM2, HTN, CKD admitted to the hospital for severe sepsis due to intra-abdominal abscess.  CT abdomen pelvis showed multiple loculated fluid collection in the lower abdomen with a large cavity in the pelvis measuring 11.5 X6X 11.7 cm.  Due to septic shock he was transferred to the ICU on 6/5.  Patient was seen by IR and drain was placed.  Hospital course was also complicated by acute kidney injury, hypoxia and atrial fibrillation with RVR.  Repeat CT showed resolution of abscess or the drain is in place but does have other pockets of fluid collection.  Patient is also on Cardizem drip, slowly uptitrating beta-blocker and heparin drip.  Repeat CT showed persistent abscess patient had drain placed 11/18/2021 by interventional radiology.   Assessment & Plan:   Principal Problem:   Intra-abdominal abscesses s/p perc drainage 11/08/2021 Active Problems:   Essential hypertension   Type 2 diabetes mellitus with morbid obesity (HCC)   Diverticulosis   Hyponatremia   Acute renal failure superimposed on chronic kidney disease (HCC)   Sleep apnea   Osteoarthritis   CKD (chronic kidney disease) stage 3, GFR 30-59 ml/min (HCC)   Adrenal nodule (HCC)   History of adenomatous polyp of colon   Spinal stenosis of lumbar region   Renal cyst   Cholelithiasis   Coffee ground emesis   Septic shock (HCC)   Tobacco abuse   #1 multiple intra-abdominal abscess likely from diverticular micro perforation Septic shock secondary to above Patient had IR drain placed which was removed on 11/16/2021. Repeat CT scan 11/17/2021 shows recurrent large abscess 6 x 9 cm. On Zosyn. Patient had IR drain placed  11/18/2021 Follow-up cultures from the drain  #2 new onset A-fib with RVR normal TSH normal echo on metoprolol and heparin drip.  Decrease the dose of metoprolol  due to bradycardia We will start p.o. anticoagulation once all surgical procedures are over.  #3 AKI on CKD stage IIIa resolved.  #4 transaminitis improving  #5 essential hypertension continue metoprolol dose was decreased yesterday due to bradycardia Added Norvasc 5 mg daily Will monitor closely.  #6 obstructive sleep apnea nocturnal CPAP ordered but he refuses most of the nights  #7 acute hypoxic respiratory distress resolved  #8 mild hyponatremia DC half-normal saline patient is able to tolerate some p.o. intake.  #9 hyperglycemia A1c was 6.1 on April 2023.  Decrease fingersticks to twice a day and cover with SSI.   Estimated body mass index is 43.67 kg/m as calculated from the following:   Height as of this encounter: 5' 6.93" (1.7 m).   Weight as of this encounter: 126.2 kg.  DVT prophylaxis: Heparin  code Status: Full code Family Communication: None  disposition Plan:  Status is: Inpatient Remains inpatient appropriate because: Recurrent intra-abdominal abscess   Consultants:  General surgery  Procedures: IR placement of intra-abdominal drain 6/ 15 /2023 Antimicrobials: Zosyn  Subjective:  Patient had 2 drains placed yesterday he denies any abdominal pain He is upset for being pricked for fingersticks multiple times during the day  Objective: Vitals:   11/19/21 0457 11/19/21 1323 11/19/21 2145 11/20/21 0435  BP: 131/73 (!) 161/77 (!) 151/67 (!) 141/61  Pulse: (!) 51 (!) 51 61 (!) 55  Resp: '17 18 16 15  '$ Temp: 98.3 F (36.8 C) 97.7 F (36.5 C) 98.4 F (36.9 C) 98.9 F (37.2 C)  TempSrc:  Oral    SpO2: 97% 96% 97% 96%  Weight:      Height:        Intake/Output Summary (Last 24 hours) at 11/20/2021 1314 Last data filed at 11/20/2021 0900 Gross per 24 hour  Intake 643.98 ml  Output 1943 ml  Net -1299.02 ml    Filed Weights   11/14/21 2334 11/16/21 2341 11/18/21 2310  Weight: 123.5 kg 126.8 kg 126.2 kg    Examination:  General exam: Appears calm  and comfortable  Respiratory system: Clear to auscultation. Respiratory effort normal. Cardiovascular system: S1 & S2 heard, RRR. No JVD, murmurs, rubs, gallops or clicks. No pedal edema. Gastrointestinal system: Abdomen is distended, soft and nontender. No organomegaly or masses felt. Normal bowel sounds heard. 2 drains in place left lower quadrant and right lower quadrant. Central nervous system: Alert and oriented. No focal neurological deficits. Extremities: Symmetric 5 x 5 power. Skin: No rashes, lesions or ulcers Psychiatry: Judgement and insight appear normal. Mood & affect appropriate.     Data Reviewed: I have personally reviewed following labs and imaging studies  CBC: Recent Labs  Lab 11/16/21 0610 11/17/21 0427 11/18/21 0438 11/19/21 0342 11/20/21 0245  WBC 14.6* 13.0* 14.2* 13.2* 9.8  HGB 12.3* 13.3 11.7* 11.4* 11.0*  HCT 37.4* 40.3 36.4* 35.3* 33.7*  MCV 98.4 97.8 98.9 98.3 98.3  PLT 243 183 268 313 962    Basic Metabolic Panel: Recent Labs  Lab 11/16/21 0610 11/17/21 0427 11/18/21 0438 11/18/21 0832 11/19/21 0342 11/20/21 0245  NA 140 135  --  135 134* 134*  K 3.5 4.2  --  3.6 3.8 3.5  CL 110 108  --  108 104 106  CO2 23 19*  --  21* 23 22  GLUCOSE 94 87  --  92 97 96  BUN 34* 28*  --  '22 21 16  '$ CREATININE 1.36* 1.34*  --  1.40* 1.42* 1.39*  CALCIUM 8.0* 8.0*  --  7.9* 8.0* 8.1*  MG 1.8 1.9 1.6*  --  2.0 1.8    GFR: Estimated Creatinine Clearance: 68.3 mL/min (A) (by C-G formula based on SCr of 1.39 mg/dL (H)). Liver Function Tests: Recent Labs  Lab 11/14/21 0423 11/15/21 0333 11/16/21 0610 11/17/21 0427  AST 71* 66* 46* 42*  ALT 54* 53* 45* 37  ALKPHOS 48 43 48 51  BILITOT 1.1 1.1 0.9 1.2  PROT 6.8 6.3* 6.4* 6.5  ALBUMIN 2.1* 2.0* 2.0* 2.1*    No results for input(s): "LIPASE", "AMYLASE" in the last 168 hours. No results for input(s): "AMMONIA" in the last 168 hours. Coagulation Profile: No results for input(s): "INR", "PROTIME" in  the last 168 hours.  Cardiac Enzymes: No results for input(s): "CKTOTAL", "CKMB", "CKMBINDEX", "TROPONINI" in the last 168 hours. BNP (last 3 results) No results for input(s): "PROBNP" in the last 8760 hours. HbA1C: No results for input(s): "HGBA1C" in the last 72 hours. CBG: Recent Labs  Lab 11/19/21 2016 11/19/21 2348 11/20/21 0432 11/20/21 0725 11/20/21 1218  GLUCAP 111* 105* 102* 95 122*    Lipid Profile: No results for input(s): "CHOL", "HDL", "LDLCALC", "TRIG", "CHOLHDL", "LDLDIRECT" in the last 72 hours. Thyroid Function Tests: No results for input(s): "TSH", "T4TOTAL", "FREET4", "T3FREE", "THYROIDAB" in the last 72 hours. Anemia Panel: No results for input(s): "VITAMINB12", "FOLATE", "FERRITIN", "TIBC", "IRON", "RETICCTPCT" in the last 72 hours. Sepsis Labs: No results for input(s): "PROCALCITON", "LATICACIDVEN" in the last 168 hours.  Recent Results (from the past 240 hour(s))  Aerobic/Anaerobic Culture w Gram Stain (surgical/deep wound)     Status: None (Preliminary result)   Collection Time: 11/18/21  4:08 PM   Specimen: Abscess  Result Value Ref Range Status   Specimen Description ABSCESS ABDOMEN  Final   Special Requests LEFT LATERAL ABDOMEN DRAIN NO 2  Final   Gram Stain   Final    NO SQUAMOUS EPITHELIAL CELLS SEEN ABUNDANT WBC SEEN NO ORGANISMS SEEN Performed at [facility], [place] [address], [place], [place] [address]    Culture RARE CANDIDA TROPICALIS  Final   Report Status PENDING  Incomplete  Aerobic/Anaerobic Culture w Gram Stain (surgical/deep wound)     Status: None (Preliminary result)   Collection Time: 11/18/21  4:08 PM   Specimen: Abscess  Result Value Ref Range Status   Specimen Description ABSCESS ABDOMEN  Final   Special Requests RT LOWER QUAD DRAIN NO 1  Final   Gram Stain   Final    NO SQUAMOUS EPITHELIAL CELLS SEEN MODERATE WBC SEEN NO ORGANISMS SEEN    Culture   Final    NO GROWTH 2 DAYS NO ANAEROBES ISOLATED; CULTURE IN  PROGRESS FOR 5 DAYS Performed at [facility], [place] [address] [place], [place] [address]    Report Status PENDING  Incomplete         Radiology Studies: CT IMAGE GUIDED DRAINAGE BY PERCUTANEOUS CATHETER  Result Date: 11/18/2021 INDICATION: 66 year old with intra-abdominal abscesses, suspect diverticular origin but etiology is unclear. Previous right transgluteal drain was removed. Re-accumulation of an air-fluid collection in the pelvis along with additional collections throughout the abdomen. EXAM: 1. CT-guided placement of a drain in the right lower abdominal abscess 2. CT-guided placement of a drain in left lateral abdominal abscess MEDICATIONS: Moderate sedation ANESTHESIA/SEDATION: Moderate (conscious) sedation was employed during this procedure. A total of Versed 4.'0mg'$  and fentanyl 150 mcg was administered intravenously at the order of the provider performing the procedure. Total intra-service moderate sedation time: 66 minutes. Patient's level of consciousness and vital signs were monitored continuously by radiology nurse throughout the procedure under the supervision of the provider performing the procedure. COMPLICATIONS: None immediate. PROCEDURE: Informed written consent was obtained from the patient after a thorough discussion of the procedural risks, benefits and alternatives. All questions were addressed. Maximal Sterile Barrier Technique was utilized including caps, mask, sterile gowns, sterile gloves, sterile drape, hand hygiene and skin antiseptic. A timeout was performed prior to the initiation of the procedure. Patient was placed in left lateral decubitus position. Images through the abdomen and pelvis were obtained. The large air-fluid collection the right lower quadrant of the abdomen was targeted. The right side of the abdomen was prepped with chlorhexidine and sterile field was created. Skin was anesthetized with 1% lidocaine. Small incision was made. Using CT guidance, 18  gauge trocar needle was directed into the collection and superstiff Amplatz wire was advanced into the collection. The tract was dilated to accommodate 12 [place] drain. Approximately 80 mL of brown purulent fluid was removed from this collection and follow up CT images were obtained. This drain appeared to be decompressing not only the right lower quadrant abscess but also the pelvic abscess. Therefore, another transgluteal drain was not placed. The catheter was sutured to skin and attached to a suction bulb. Fluid was collected for culture. The patient was rolled into the onto his back and images were obtained in the supine position. The fluid collection along the lateral left abdomen was targeted. The left side  of the abdomen was prepped chlorhexidine and sterile field was created. Maximal barrier sterile technique was utilized including caps, mask, sterile gowns, sterile gloves, sterile drape, hand hygiene and skin antiseptic. Skin was anesthetized with 1% lidocaine. Small incision was made. Using CT guidance, an 18 gauge trocar needle was directed into the collection and cloudy yellow fluid was aspirated. Superstiff Amplatz wire was advanced and the tract was dilated to accommodate a 10 [place] multipurpose drain. Approximately 30 mL of cloudy yellow purulent fluid was removed. Follow up CT images were obtained. Drain was sutured to skin and attached to a suction bulb. Fluid was sent for culture. RADIATION DOSE REDUCTION: This exam was performed according to the departmental dose-optimization program which includes automated exposure control, adjustment of the mA and/or kV according to patient size and/or use of iterative reconstruction technique. FINDINGS: 48 French drain placed in the large gas-filled collection in the right lower quadrant. 80 mL of purulent fluid was removed from this drain. 58 French drain was decompressing the right lower quadrant abscess as well as the pelvic abscess. 10 French drain placed  in left lateral abdominal abscess. 30 mL of cloudy yellow fluid was removed from this drain. Enlargement of the right piriformis muscle compatible with a hematoma formation from the previous transgluteal drain. IMPRESSION: 1. CT-guided placement of abscess drain in the right lower quadrant abscess. This drain appears to be decompressing the right lower quadrant abscess and the pelvic abscess. 2. CT-guided placement of a drainage catheter in the left lateral abdominal abscess. Electronically Signed   By: Markus Daft M.D.   On: 11/18/2021 17:37   CT IMAGE GUIDED DRAINAGE BY PERCUTANEOUS CATHETER  Result Date: 11/18/2021 INDICATION: 66 year old with intra-abdominal abscesses, suspect diverticular origin but etiology is unclear. Previous right transgluteal drain was removed. Re-accumulation of an air-fluid collection in the pelvis along with additional collections throughout the abdomen. EXAM: 1. CT-guided placement of a drain in the right lower abdominal abscess 2. CT-guided placement of a drain in left lateral abdominal abscess MEDICATIONS: Moderate sedation ANESTHESIA/SEDATION: Moderate (conscious) sedation was employed during this procedure. A total of Versed 4.'0mg'$  and fentanyl 150 mcg was administered intravenously at the order of the provider performing the procedure. Total intra-service moderate sedation time: 66 minutes. Patient's level of consciousness and vital signs were monitored continuously by radiology nurse throughout the procedure under the supervision of the provider performing the procedure. COMPLICATIONS: None immediate. PROCEDURE: Informed written consent was obtained from the patient after a thorough discussion of the procedural risks, benefits and alternatives. All questions were addressed. Maximal Sterile Barrier Technique was utilized including caps, mask, sterile gowns, sterile gloves, sterile drape, hand hygiene and skin antiseptic. A timeout was performed prior to the initiation of the  procedure. Patient was placed in left lateral decubitus position. Images through the abdomen and pelvis were obtained. The large air-fluid collection the right lower quadrant of the abdomen was targeted. The right side of the abdomen was prepped with chlorhexidine and sterile field was created. Skin was anesthetized with 1% lidocaine. Small incision was made. Using CT guidance, 18 gauge trocar needle was directed into the collection and superstiff Amplatz wire was advanced into the collection. The tract was dilated to accommodate 12 [place] drain. Approximately 80 mL of brown purulent fluid was removed from this collection and follow up CT images were obtained. This drain appeared to be decompressing not only the right lower quadrant abscess but also the pelvic abscess. Therefore, another transgluteal drain was not placed. The catheter  was sutured to skin and attached to a suction bulb. Fluid was collected for culture. The patient was rolled into the onto his back and images were obtained in the supine position. The fluid collection along the lateral left abdomen was targeted. The left side of the abdomen was prepped chlorhexidine and sterile field was created. Maximal barrier sterile technique was utilized including caps, mask, sterile gowns, sterile gloves, sterile drape, hand hygiene and skin antiseptic. Skin was anesthetized with 1% lidocaine. Small incision was made. Using CT guidance, an 18 gauge trocar needle was directed into the collection and cloudy yellow fluid was aspirated. Superstiff Amplatz wire was advanced and the tract was dilated to accommodate a 10 [place] multipurpose drain. Approximately 30 mL of cloudy yellow purulent fluid was removed. Follow up CT images were obtained. Drain was sutured to skin and attached to a suction bulb. Fluid was sent for culture. RADIATION DOSE REDUCTION: This exam was performed according to the departmental dose-optimization program which includes automated exposure  control, adjustment of the mA and/or kV according to patient size and/or use of iterative reconstruction technique. FINDINGS: 77 French drain placed in the large gas-filled collection in the right lower quadrant. 80 mL of purulent fluid was removed from this drain. 52 French drain was decompressing the right lower quadrant abscess as well as the pelvic abscess. 10 French drain placed in left lateral abdominal abscess. 30 mL of cloudy yellow fluid was removed from this drain. Enlargement of the right piriformis muscle compatible with a hematoma formation from the previous transgluteal drain. IMPRESSION: 1. CT-guided placement of abscess drain in the right lower quadrant abscess. This drain appears to be decompressing the right lower quadrant abscess and the pelvic abscess. 2. CT-guided placement of a drainage catheter in the left lateral abdominal abscess. Electronically Signed   By: Markus Daft M.D.   On: 11/18/2021 17:37        Scheduled Meds:  amLODipine  5 mg Oral Daily   chlorhexidine  15 mL Mouth Rinse BID   Chlorhexidine Gluconate Cloth  6 each Topical Daily   feeding supplement  237 mL Oral BID BM   insulin aspart  0-15 Units Subcutaneous TID WC   lip balm  1 application  Topical BID   mouth rinse  15 mL Mouth Rinse q12n4p   metoprolol succinate  50 mg Oral Daily   pantoprazole  40 mg Oral BID   sodium chloride flush  10-40 mL Intracatheter Q12H   sodium chloride flush  5 mL Intracatheter Q8H   Continuous Infusions:  sodium chloride 250 mL (11/08/21 0653)   sodium chloride Stopped (11/09/21 1144)   sodium chloride 10 mL/hr at 11/19/21 0600   heparin 2,850 Units/hr (11/20/21 1207)   ondansetron (ZOFRAN) IV     piperacillin-tazobactam (ZOSYN)  IV 3.375 g (11/20/21 1217)     LOS: 13 days    Time spent: 35 minutes  Georgette Shell, MD  11/20/2021, 1:14 PM

## 2021-11-21 DIAGNOSIS — K651 Peritoneal abscess: Secondary | ICD-10-CM | POA: Diagnosis not present

## 2021-11-21 DIAGNOSIS — B9620 Unspecified Escherichia coli [E. coli] as the cause of diseases classified elsewhere: Secondary | ICD-10-CM

## 2021-11-21 LAB — CBC
HCT: 35.1 % — ABNORMAL LOW (ref 39.0–52.0)
Hemoglobin: 11.4 g/dL — ABNORMAL LOW (ref 13.0–17.0)
MCH: 31.8 pg (ref 26.0–34.0)
MCHC: 32.5 g/dL (ref 30.0–36.0)
MCV: 97.8 fL (ref 80.0–100.0)
Platelets: 285 10*3/uL (ref 150–400)
RBC: 3.59 MIL/uL — ABNORMAL LOW (ref 4.22–5.81)
RDW: 14.6 % (ref 11.5–15.5)
WBC: 8.7 10*3/uL (ref 4.0–10.5)
nRBC: 0 % (ref 0.0–0.2)

## 2021-11-21 LAB — BASIC METABOLIC PANEL
Anion gap: 9 (ref 5–15)
BUN: 13 mg/dL (ref 8–23)
CO2: 22 mmol/L (ref 22–32)
Calcium: 8.2 mg/dL — ABNORMAL LOW (ref 8.9–10.3)
Chloride: 104 mmol/L (ref 98–111)
Creatinine, Ser: 1.35 mg/dL — ABNORMAL HIGH (ref 0.61–1.24)
GFR, Estimated: 59 mL/min — ABNORMAL LOW (ref >60)
Glucose, Bld: 106 mg/dL — ABNORMAL HIGH (ref 70–99)
Potassium: 3.6 mmol/L (ref 3.5–5.1)
Sodium: 135 mmol/L (ref 135–145)

## 2021-11-21 LAB — GLUCOSE, CAPILLARY
Glucose-Capillary: 128 mg/dL — ABNORMAL HIGH (ref 70–99)
Glucose-Capillary: 109 mg/dL — ABNORMAL HIGH (ref 70–99)
Glucose-Capillary: 127 mg/dL — ABNORMAL HIGH (ref 70–99)
Glucose-Capillary: 119 mg/dL — ABNORMAL HIGH (ref 70–99)

## 2021-11-21 LAB — HEPARIN LEVEL (UNFRACTIONATED): Heparin Unfractionated: 0.64 IU/mL (ref 0.30–0.70)

## 2021-11-21 LAB — MAGNESIUM: Magnesium: 1.7 mg/dL (ref 1.7–2.4)

## 2021-11-21 MED ORDER — FUROSEMIDE 10 MG/ML IJ SOLN
20.0000 mg | Freq: Every day | INTRAMUSCULAR | Status: DC
  Administered 2021-11-21 – 2021-11-22 (×2): 20 mg via INTRAVENOUS
  Filled 2021-11-21 (×2): qty 2

## 2021-11-21 MED ORDER — ORAL CARE MOUTH RINSE: 15.0000 mL | OROMUCOSAL | Status: DC | PRN

## 2021-11-21 NOTE — Progress Notes (Signed)
PROGRESS NOTE    Robert Larson  PXT:[id number] DOB: 1956-04-06 DOA: 11/07/2021 PCP: Jeanie Sewer, NP  Brief Narrative: 66 year old with history of DM2, HTN, CKD admitted to the hospital for severe sepsis due to intra-abdominal abscess.  CT abdomen pelvis showed multiple loculated fluid collection in the lower abdomen with a large cavity in the pelvis measuring 11.5 X6X 11.7 cm.  Due to septic shock he was transferred to the ICU on 6/5.  Patient was seen by IR and drain was placed.  Hospital course was also complicated by acute kidney injury, hypoxia and atrial fibrillation with RVR.  Repeat CT showed resolution of abscess or the drain is in place but does have other pockets of fluid collection.  Patient is also on Cardizem drip, slowly uptitrating beta-blocker and heparin drip.  Repeat CT showed persistent abscess patient had drain placed 11/18/2021 by interventional radiology.  11/21/2021 patient has Candida tropicalis growing in the abscess drainage.  ID consulted patient started on micafungin.  Patient has swelling in bilateral lower extremity and some erythema noted.  He was on a diuretic and an ARB at home.  Assessment & Plan:   Principal Problem:   Intra-abdominal abscesses s/p perc drainage 11/08/2021 Active Problems:   Essential hypertension   Type 2 diabetes mellitus with morbid obesity (HCC)   Diverticulosis   Hyponatremia   Acute renal failure superimposed on chronic kidney disease (HCC)   Sleep apnea   Osteoarthritis   CKD (chronic kidney disease) stage 3, GFR 30-59 ml/min (HCC)   Adrenal nodule (HCC)   History of adenomatous polyp of colon   Spinal stenosis of lumbar region   Renal cyst   Cholelithiasis   Coffee ground emesis   Septic shock (HCC)   Tobacco abuse   #1 multiple intra-abdominal abscess likely from diverticular micro perforation Septic shock secondary to above Patient had IR drain placed which was removed on 11/16/2021. Repeat CT scan 11/17/2021 shows  recurrent large abscess 6 x 9 cm. On Zosyn. Patient had IR drain placed  11/18/2021 Follow-up cultures from the drain shows Candida tropicalis.  ID consulted.  Micafungin started.  #2 new onset A-fib with RVR normal TSH normal echo on metoprolol and heparin drip.  Decrease the dose of metoprolol due to bradycardia We will start p.o. anticoagulation once all surgical procedures are over.  #3 AKI on CKD stage IIIa resolved.  #4 transaminitis improving  #5 essential hypertension continue metoprolol dose was decreased yesterday due to bradycardia Added Norvasc 5 mg daily Will monitor closely.  #6 obstructive sleep apnea nocturnal CPAP ordered but he refuses most of the nights  #7 acute hypoxic respiratory distress resolved  #8 mild hyponatremia DC half-normal saline patient is able to tolerate some p.o. intake.  #9 hyperglycemia A1c was 6.1 on April 2023.  Decrease fingersticks to twice a day and cover with SSI.  #10 bilateral lower extremity edema patient was on a mild diuretic at home I will start him on Lasix 20 mg daily this was not restarted due to sepsis and hypotension on admission.  Patient was on HCTZ and an ARB at home which we will continue to hold.   Estimated body mass index is 42.04 kg/m as calculated from the following:   Height as of this encounter: 5' 6.93" (1.7 m).   Weight as of this encounter: 121.5 kg.  DVT prophylaxis: Heparin  code Status: Full code Family Communication: None  disposition Plan:  Status is: Inpatient Remains inpatient appropriate because: Recurrent intra-abdominal abscess  Consultants:  General surgery  Procedures: IR placement of intra-abdominal drain 6/ 15 /2023 Antimicrobials: Zosyn  Subjective:  He denies any new complaints no nausea no vomiting denies abdominal pain shortness of breath encouraged to use incentive spirometer and to be sitting up in chair during the day and ambulate. Objective: Vitals:   11/20/21 2057 11/21/21 0000  11/21/21 0417 11/21/21 1224  BP: (!) 146/77  127/69 (!) 142/84  Pulse: 67  72 68  Resp: '19  18 16  '$ Temp: 98.9 F (37.2 C)  98.3 F (36.8 C) 98.4 F (36.9 C)  TempSrc: Oral     SpO2: 96%  95% 96%  Weight:  121.5 kg    Height:        Intake/Output Summary (Last 24 hours) at 11/21/2021 1334 Last data filed at 11/21/2021 1236 Gross per 24 hour  Intake 1555.37 ml  Output 2560 ml  Net -1004.63 ml    Filed Weights   11/16/21 2341 11/18/21 2310 11/21/21 0000  Weight: 126.8 kg 126.2 kg 121.5 kg    Examination:  General exam: Appears calm and comfortable  Respiratory system: Clear to auscultation. Respiratory effort normal. Cardiovascular system: S1 & S2 heard, RRR. No JVD, murmurs, rubs, gallops or clicks. No pedal edema. Gastrointestinal system: Abdomen is distended, soft and nontender. No organomegaly or masses felt. Normal bowel sounds heard. 2 drains in place left lower quadrant and right lower quadrant. Central nervous system: Alert and oriented. No focal neurological deficits. Extremities: Symmetric 5 x 5 power. Skin: No rashes, lesions or ulcers Psychiatry: Judgement and insight appear normal. Mood & affect appropriate.     Data Reviewed: I have personally reviewed following labs and imaging studies  CBC: Recent Labs  Lab 11/17/21 0427 11/18/21 0438 11/19/21 0342 11/20/21 0245 11/21/21 0527  WBC 13.0* 14.2* 13.2* 9.8 8.7  HGB 13.3 11.7* 11.4* 11.0* 11.4*  HCT 40.3 36.4* 35.3* 33.7* 35.1*  MCV 97.8 98.9 98.3 98.3 97.8  PLT 183 268 313 286 778    Basic Metabolic Panel: Recent Labs  Lab 11/17/21 0427 11/18/21 0438 11/18/21 0832 11/19/21 0342 11/20/21 0245 11/21/21 0527  NA 135  --  135 134* 134* 135  K 4.2  --  3.6 3.8 3.5 3.6  CL 108  --  108 104 106 104  CO2 19*  --  21* '23 22 22  '$ GLUCOSE 87  --  92 97 96 106*  BUN 28*  --  '22 21 16 13  '$ CREATININE 1.34*  --  1.40* 1.42* 1.39* 1.35*  CALCIUM 8.0*  --  7.9* 8.0* 8.1* 8.2*  MG 1.9 1.6*  --  2.0 1.8  1.7    GFR: Estimated Creatinine Clearance: 68.9 mL/min (A) (by C-G formula based on SCr of 1.35 mg/dL (H)). Liver Function Tests: Recent Labs  Lab 11/15/21 0333 11/16/21 0610 11/17/21 0427  AST 66* 46* 42*  ALT 53* 45* 37  ALKPHOS 43 48 51  BILITOT 1.1 0.9 1.2  PROT 6.3* 6.4* 6.5  ALBUMIN 2.0* 2.0* 2.1*    No results for input(s): "LIPASE", "AMYLASE" in the last 168 hours. No results for input(s): "AMMONIA" in the last 168 hours. Coagulation Profile: No results for input(s): "INR", "PROTIME" in the last 168 hours.  Cardiac Enzymes: No results for input(s): "CKTOTAL", "CKMB", "CKMBINDEX", "TROPONINI" in the last 168 hours. BNP (last 3 results) No results for input(s): "PROBNP" in the last 8760 hours. HbA1C: No results for input(s): "HGBA1C" in the last 72 hours. CBG: Recent Labs  Lab  11/20/21 1218 11/20/21 1602 11/21/21 0558 11/21/21 0724 11/21/21 1221  GLUCAP 122* 121* 128* 109* 127*    Lipid Profile: No results for input(s): "CHOL", "HDL", "LDLCALC", "TRIG", "CHOLHDL", "LDLDIRECT" in the last 72 hours. Thyroid Function Tests: No results for input(s): "TSH", "T4TOTAL", "FREET4", "T3FREE", "THYROIDAB" in the last 72 hours. Anemia Panel: No results for input(s): "VITAMINB12", "FOLATE", "FERRITIN", "TIBC", "IRON", "RETICCTPCT" in the last 72 hours. Sepsis Labs: No results for input(s): "PROCALCITON", "LATICACIDVEN" in the last 168 hours.  Recent Results (from the past 240 hour(s))  Aerobic/Anaerobic Culture w Gram Stain (surgical/deep wound)     Status: None (Preliminary result)   Collection Time: 11/18/21  4:08 PM   Specimen: Abscess  Result Value Ref Range Status   Specimen Description ABSCESS ABDOMEN  Final   Special Requests LEFT LATERAL ABDOMEN DRAIN NO 2  Final   Gram Stain   Final    NO SQUAMOUS EPITHELIAL CELLS SEEN ABUNDANT WBC SEEN NO ORGANISMS SEEN    Culture   Final    RARE CANDIDA TROPICALIS HOLDING FOR POSSIBLE ANAEROBE Performed at [facility], [place] [address] [place], [place] [address]    Report Status PENDING  Incomplete  Aerobic/Anaerobic Culture w Gram Stain (surgical/deep wound)     Status: None (Preliminary result)   Collection Time: 11/18/21  4:08 PM   Specimen: Abscess  Result Value Ref Range Status   Specimen Description ABSCESS ABDOMEN  Final   Special Requests RT LOWER QUAD DRAIN NO 1  Final   Gram Stain   Final    NO SQUAMOUS EPITHELIAL CELLS SEEN MODERATE WBC SEEN NO ORGANISMS SEEN    Culture   Final    NO GROWTH 3 DAYS NO ANAEROBES ISOLATED; CULTURE IN PROGRESS FOR 5 DAYS Performed at [facility], [place] [address] [place],  [address]    Report Status PENDING  Incomplete         Radiology Studies: No results found.      Scheduled Meds:  amLODipine  5 mg Oral Daily   Chlorhexidine Gluconate Cloth  6 each Topical Daily   feeding supplement  237 mL Oral BID BM   furosemide  20 mg Intravenous Daily   insulin aspart  0-15 Units Subcutaneous TID WC   lip balm  1 application  Topical BID   mouth rinse  15 mL Mouth Rinse q12n4p   metoprolol succinate  50 mg Oral Daily   pantoprazole  40 mg Oral BID   sodium chloride flush  10-40 mL Intracatheter Q12H   sodium chloride flush  5 mL Intracatheter Q8H   Continuous Infusions:  sodium chloride 250 mL (11/08/21 0653)   sodium chloride Stopped (11/09/21 1144)   sodium chloride 10 mL/hr at 11/19/21 0600   heparin 2,800 Units/hr (11/21/21 0937)   micafungin (MYCAMINE) 100 mg in sodium chloride 0.9 % 100 mL IVPB 100 mg (11/20/21 1717)   ondansetron (ZOFRAN) IV     piperacillin-tazobactam (ZOSYN)  IV 3.375 g (11/21/21 1103)     LOS: 14 days    Time spent: 35 minutes  Georgette Shell, MD  11/21/2021, 1:34 PM

## 2021-11-21 NOTE — Progress Notes (Signed)
ANTICOAGULATION CONSULT NOTE - Follow Up Consult  Pharmacy Consult for Heparin Indication: atrial fibrillation  No Known Allergies  Patient Measurements: Height: 5' 6.93" (170 cm) Weight: 121.5 kg (267 lb 13.7 oz) IBW/kg (Calculated) : 65.94 Heparin Dosing Weight:    Vital Signs: Temp: 98.3 F (36.8 C) (06/18 0417) Temp Source: Oral (06/17 2057) BP: 127/69 (06/18 0417) Pulse Rate: 72 (06/18 0417)  Labs: Recent Labs    11/19/21 0342 11/20/21 0245 11/20/21 1253 11/21/21 0527  HGB 11.4* 11.0*  --  11.4*  HCT 35.3* 33.7*  --  35.1*  PLT 313 286  --  285  HEPARINUNFRC 0.31 0.19* 0.42 0.64  CREATININE 1.42* 1.39*  --  1.35*     Estimated Creatinine Clearance: 68.9 mL/min (A) (by C-G formula based on SCr of 1.35 mg/dL (H)).   Assessment: 66 yo male presented with severe sepsis 2/2 intra-abdominal abscess.  Patient now with new onset atrial fibrillation w/ RVR.  Pharmacy consulted to dose heparin IV for Afib.  Today, 11/21/21: - HL 0.64 - therapeutic on 2850 units/hr, however is trending up towards upper end of goal - Hgb stable (11), plts WNL - No bleeding reported  Goal of Therapy:  Heparin level 0.3-0.7 units/ml Monitor platelets by anticoagulation protocol: Yes   Plan:  Reduce heparin drip slightly to 2800 units/hr Daily HL and CBC GI problems limiting transition to [place], PharmD 11/21/2021 7:58 AM

## 2021-11-21 NOTE — Progress Notes (Signed)
Subjective/Chief Complaint: No significant abdominal pain Tolerating soft diet Afebrile/ normal WBC Had a BM   Objective: Vital signs in last 24 hours: Temp:  [98.3 F (36.8 C)-98.9 F (37.2 C)] 98.3 F (36.8 C) (06/18 0417) Pulse Rate:  [61-72] 72 (06/18 0417) Resp:  [16-19] 18 (06/18 0417) BP: (127-154)/(69-78) 127/69 (06/18 0417) SpO2:  [94 %-96 %] 95 % (06/18 0417) Weight:  [121.5 kg] 121.5 kg (06/18 0000) Last BM Date : 11/19/21  Intake/Output from previous day: 06/17 0701 - 06/18 0700 In: 1523.1 [P.O.:360; I.V.:822.7; IV Piggyback:340.5] Out: 2260 [Urine:2200; Drains:60] Intake/Output this shift: No intake/output data recorded.  General: pleasant, WD, obese male Lungs: normal effort  Abd: soft, NT, ND, LLQ drain with serous looking fluid, RLQ drain with bloody purulent appearing fluid Psych: A&Ox3 with an appropriate affect.     Lab Results:  Recent Labs    11/20/21 0245 11/21/21 0527  WBC 9.8 8.7  HGB 11.0* 11.4*  HCT 33.7* 35.1*  PLT 286 285   BMET Recent Labs    11/20/21 0245 11/21/21 0527  NA 134* 135  K 3.5 3.6  CL 106 104  CO2 22 22  GLUCOSE 96 106*  BUN 16 13  CREATININE 1.39* 1.35*  CALCIUM 8.1* 8.2*   PT/INR No results for input(s): "LABPROT", "INR" in the last 72 hours. ABG No results for input(s): "PHART", "HCO3" in the last 72 hours.  Invalid input(s): "PCO2", "PO2"  Studies/Results: No results found.  Anti-infectives: Anti-infectives (From admission, onward)    Start     Dose/Rate Route Frequency Ordered Stop   11/20/21 1645  micafungin (MYCAMINE) 100 mg in sodium chloride 0.9 % 100 mL IVPB        100 mg 105 mL/hr over 1 Hours Intravenous Every 24 hours 11/20/21 1554     11/08/21 0730  vancomycin (VANCOREADY) IVPB 1750 mg/350 mL        1,750 mg 175 mL/hr over 120 Minutes Intravenous  Once 11/08/21 0641 11/08/21 0908   11/08/21 0641  vancomycin variable dose per unstable renal function (pharmacist dosing)  Status:   Discontinued         Does not apply See admin instructions 11/08/21 0641 11/08/21 1138   11/07/21 2000  piperacillin-tazobactam (ZOSYN) IVPB 3.375 g        3.375 g 12.5 mL/hr over 240 Minutes Intravenous Every 8 hours 11/07/21 1921     11/07/21 1430  piperacillin-tazobactam (ZOSYN) IVPB 3.375 g        3.375 g 100 mL/hr over 30 Minutes Intravenous  Once 11/07/21 1418 11/07/21 1535       Assessment/Plan: 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  - s/p IR drain placement 6/5 - drainage purulent, Cx with pan-sensitive E.coli  - repeat CT AP 6/9 - with resolution of large abscess but some persistent interloop collections  - IR removed TG drain 6/13 - WBC 13 this AM - repeat CT 6/14 with recurrent pelvic abscess, larger collection in the left paracolic gutter - s/p placement of LLQ and RLQ drains yesterday - RLQ drain with some concern for some feculent looking material in tubing, bulb more bloody-purulent, LLQ drain looks serous - reasonable to see how patient does with drains, if not improving then likely will need to just proceed with Hartmann's - will probably repeat CT scan in next couple of days   FEN: low fiber diet, IVF per TRH VTE: SCDs, hep gtt ID: Zosyn 6/4>>   - per CCM -  A. 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

## 2021-11-21 NOTE — Progress Notes (Signed)
Occupational Therapy Treatment Patient Details Name: Robert Larson MRN: [id number] DOB: August 02, 1955 Today's Date: 11/21/2021   History of present illness 66 year old with history of DM2, HTN, CKD admitted to the hospital for severe sepsis due to intra-abdominal abscess.  CT abdomen pelvis showed multiple loculated fluid collection in the lower abdomen with a large cavity in the pelvis measuring 11.5 X6X 11.7 cm.  Due to septic shock he was transferred to the ICU on 6/5.  Patient was seen by IR and drain was placed.  Hospital course was also complicated by acute kidney injury, hypoxia and atrial fibrillation with RVR.  Repeat CT showed resolution of abscess or the drain is in place but does have other pockets of fluid collection.  Patient is also on Cardizem drip, slowly uptitrating beta-blocker and heparin drip.   Repeat CT showed persistent abscess patient had drain placed 11/18/2021 by interventional radiology.   OT comments  Treatment focused on activity tolerance needed for ADLs and IADLs on return home. Patient able to ambulate in hall and then back to room with a seated rest break. He reports getting up with his daughter in the room and that he is able to perform toileting ADLs mostly by himself at times - lines/leads limit his independence. Limited by pain in feet from swelling in regards to ambulation. Patient will have assistance of daughter at baseline. Do not expect he will need OT at discharge. Will continue to follow acutely.   Recommendations for follow up therapy are one component of a multi-disciplinary discharge planning process, led by the attending physician.  Recommendations may be updated based on patient status, additional functional criteria and insurance authorization.    Follow Up Recommendations  No OT follow up    Assistance Recommended at Discharge Intermittent Supervision/Assistance  Patient can return home with the following  A little help with  bathing/dressing/bathroom;Assistance with cooking/housework;Help with stairs or ramp for entrance   Equipment Recommendations  None recommended by OT    Recommendations for Other Services      Precautions / Restrictions Precautions Precautions: Other (comment) Precaution Comments: B ABD JP drains (bulbs) + L CVC triple Lumen Restrictions Weight Bearing Restrictions: No       Mobility Bed Mobility               General bed mobility comments: OOB    Transfers Overall transfer level: Needs assistance Equipment used: Rolling walker (2 wheels)   Sit to Stand: Supervision     Step pivot transfers: Supervision, +2 safety/equipment     General transfer comment: Patient standing in room with walker when therapist entered. Patient ambulated 45 feet with walker before needing seated rest break and then able to ambulate back to room. Supervision to ambulate with +2 for chair follow. No physical assistance. Hand placement cue.     Balance Overall balance assessment: Mild deficits observed, not formally tested                                         ADL either performed or assessed with clinical judgement   ADL                                              Extremity/Trunk Assessment Upper Extremity Assessment Upper Extremity Assessment: Overall  WFL for tasks assessed   Lower Extremity Assessment Lower Extremity Assessment: Defer to PT evaluation   Cervical / Trunk Assessment Cervical / Trunk Assessment: Normal    Vision Patient Visual Report: No change from baseline     Perception     Praxis      Cognition Arousal/Alertness: Awake/alert Behavior During Therapy: WFL for tasks assessed/performed Overall Cognitive Status: Within Functional Limits for tasks assessed                                          Exercises      Shoulder Instructions       General Comments      Pertinent Vitals/ Pain        Pain Assessment Pain Assessment: Faces Faces Pain Scale: Hurts a little bit Pain Location: LEs/feet Pain Descriptors / Indicators: Grimacing Pain Intervention(s): Limited activity within patient's tolerance  Home Living                                          Prior Functioning/Environment              Frequency           Progress Toward Goals  OT Goals(current goals can now be found in the care plan section)  Progress towards OT goals: Progressing toward goals  Acute Rehab OT Goals Patient Stated Goal: go home soon OT Goal Formulation: With patient Time For Goal Achievement: 11/25/21 Potential to Achieve Goals: Good  Plan Discharge plan needs to be updated    Co-evaluation                 AM-PAC OT "6 Clicks" Daily Activity     Outcome Measure   Help from another person eating meals?: None Help from another person taking care of personal grooming?: None Help from another person toileting, which includes using toliet, bedpan, or urinal?: A Little Help from another person bathing (including washing, rinsing, drying)?: A Little Help from another person to put on and taking off regular upper body clothing?: A Little Help from another person to put on and taking off regular lower body clothing?: A Lot 6 Click Score: 19    End of Session Equipment Utilized During Treatment: Rolling walker (2 wheels)  OT Visit Diagnosis: Pain   Activity Tolerance Patient tolerated treatment well   Patient Left in chair;with family/visitor present   Nurse Communication Mobility status        Time: 1040-1055 OT Time Calculation (min): 15 min  Charges: OT General Charges $OT Visit: 1 Visit OT Treatments $Therapeutic Activity: 8-22 mins  Daiden Coltrane, OTR/L [place]  Office [phone] Pager: [phone]   Lenward Chancellor 11/21/2021, 12:45 PM

## 2021-11-21 NOTE — Consult Note (Signed)
[facility]       Reason for Consult: intra abdominal abscess    Referring Physician: Dr. Rodena Piety  Principal Problem:   Intra-abdominal abscesses s/p perc drainage 11/08/2021 Active Problems:   Essential hypertension   Type 2 diabetes mellitus with morbid obesity (HCC)   Diverticulosis   Hyponatremia   Acute renal failure superimposed on chronic kidney disease (HCC)   Sleep apnea   Osteoarthritis   CKD (chronic kidney disease) stage 3, GFR 30-59 ml/min (HCC)   Adrenal nodule (HCC)   History of adenomatous polyp of colon   Spinal stenosis of lumbar region   Renal cyst   Cholelithiasis   Coffee ground emesis   Septic shock (HCC)   Tobacco abuse    amLODipine  5 mg Oral Daily   Chlorhexidine Gluconate Cloth  6 each Topical Daily   feeding supplement  237 mL Oral BID BM   furosemide  20 mg Intravenous Daily   insulin aspart  0-15 Units Subcutaneous TID WC   lip balm  1 application  Topical BID   mouth rinse  15 mL Mouth Rinse q12n4p   metoprolol succinate  50 mg Oral Daily   pantoprazole  40 mg Oral BID   sodium chloride flush  10-40 mL Intracatheter Q12H   sodium chloride flush  5 mL Intracatheter Q8H    Recommendations: Micafungin added Continue piperacillin/tazobactam  Assessment: He has an intraabdominal abscess and cultures noted.  Plan noted for possible surgery vs continued treatment.  Will continue to follow   Antibiotics: Day 15 piperacillin/tazobactam  HPI: Robert Larson is a 66 y.o. male with CKD admitted initially to the ICU with septic shock and found to have a multi-loculated fluid collection from diverticular micro perforation and IR drain placement done on 6/13 then an additional drain on 6/15.  Culture with E coli, Candida tropicalis.  Recent WBC 8.7 and has been afebrile.  Tmax 100.8 on admission and WBC up to 26.1.   Review of Systems:  Constitutional: negative for fevers and chills All other systems reviewed and  are negative    Past Medical History:  Diagnosis Date   Acute right-sided low back pain with bilateral sciatica 03/13/2017   Contusion of left hip and thigh 02/13/2017   Osteoarthritis    Protein in urine    Sleep apnea     Social History   Tobacco Use   Smoking status: Every Day    Packs/day: 1.00    Types: Cigarettes   Smokeless tobacco: Never  Vaping Use   Vaping Use: Never used  Substance Use Topics   Alcohol use: No   Drug use: Never    Family History  Problem Relation Age of Onset   Breast cancer Mother    Lung cancer Father    Colon cancer Neg Hx    Rectal cancer Neg Hx     No Known Allergies  Physical Exam: Constitutional: in no apparent distress  Vitals:   11/20/21 2057 11/21/21 0417  BP: (!) 146/77 127/69  Pulse: 67 72  Resp: 19 18  Temp: 98.9 F (37.2 C) 98.3 F (36.8 C)  SpO2: 96% 95%   EYES: anicteric Respiratory: normal respiratory effort GI: two drains, right with bloody, thick material, left with serosanguinous drainage.  Lab Results  Component Value Date   WBC 8.7 11/21/2021   HGB 11.4 (L) 11/21/2021   HCT 35.1 (L) 11/21/2021   MCV 97.8 11/21/2021   PLT 285 11/21/2021  Lab Results  Component Value Date   CREATININE 1.35 (H) 11/21/2021   BUN 13 11/21/2021   NA 135 11/21/2021   K 3.6 11/21/2021   CL 104 11/21/2021   CO2 22 11/21/2021    Lab Results  Component Value Date   ALT 37 11/17/2021   AST 42 (H) 11/17/2021   ALKPHOS 51 11/17/2021     Microbiology: Recent Results (from the past 240 hour(s))  Aerobic/Anaerobic Culture w Gram Stain (surgical/deep wound)     Status: None (Preliminary result)   Collection Time: 11/18/21  4:08 PM   Specimen: Abscess  Result Value Ref Range Status   Specimen Description ABSCESS ABDOMEN  Final   Special Requests LEFT LATERAL ABDOMEN DRAIN NO 2  Final   Gram Stain   Final    NO SQUAMOUS EPITHELIAL CELLS SEEN ABUNDANT WBC SEEN NO ORGANISMS SEEN    Culture   Final    RARE CANDIDA  TROPICALIS HOLDING FOR POSSIBLE ANAEROBE Performed at [facility], [place] [address], [place], [place] [address]    Report Status PENDING  Incomplete  Aerobic/Anaerobic Culture w Gram Stain (surgical/deep wound)     Status: None (Preliminary result)   Collection Time: 11/18/21  4:08 PM   Specimen: Abscess  Result Value Ref Range Status   Specimen Description ABSCESS ABDOMEN  Final   Special Requests RT LOWER QUAD DRAIN NO 1  Final   Gram Stain   Final    NO SQUAMOUS EPITHELIAL CELLS SEEN MODERATE WBC SEEN NO ORGANISMS SEEN    Culture   Final    NO GROWTH 3 DAYS NO ANAEROBES ISOLATED; CULTURE IN PROGRESS FOR 5 DAYS Performed at [facility], [place] [address], [place], [place] [address]    Report Status PENDING  Incomplete    Thayer Headings, [facility] Infectious Disease [place] [email or web address] 11/21/2021, 12:10 PM

## 2021-11-22 ENCOUNTER — Ambulatory Visit: Payer: Commercial Managed Care - HMO | Admitting: Internal Medicine

## 2021-11-22 ENCOUNTER — Inpatient Hospital Stay (HOSPITAL_COMMUNITY): Payer: Commercial Managed Care - HMO

## 2021-11-22 DIAGNOSIS — R6521 Severe sepsis with septic shock: Secondary | ICD-10-CM | POA: Diagnosis not present

## 2021-11-22 DIAGNOSIS — K5790 Diverticulosis of intestine, part unspecified, without perforation or abscess without bleeding: Secondary | ICD-10-CM | POA: Diagnosis not present

## 2021-11-22 DIAGNOSIS — K651 Peritoneal abscess: Secondary | ICD-10-CM | POA: Diagnosis not present

## 2021-11-22 DIAGNOSIS — L0291 Cutaneous abscess, unspecified: Secondary | ICD-10-CM | POA: Insufficient documentation

## 2021-11-22 DIAGNOSIS — L02211 Cutaneous abscess of abdominal wall: Secondary | ICD-10-CM

## 2021-11-22 DIAGNOSIS — N183 Chronic kidney disease, stage 3 unspecified: Secondary | ICD-10-CM

## 2021-11-22 LAB — BASIC METABOLIC PANEL
Anion gap: 7 (ref 5–15)
BUN: 12 mg/dL (ref 8–23)
CO2: 24 mmol/L (ref 22–32)
Calcium: 8.3 mg/dL — ABNORMAL LOW (ref 8.9–10.3)
Chloride: 105 mmol/L (ref 98–111)
Creatinine, Ser: 1.39 mg/dL — ABNORMAL HIGH (ref 0.61–1.24)
GFR, Estimated: 57 mL/min — ABNORMAL LOW (ref >60)
Glucose, Bld: 113 mg/dL — ABNORMAL HIGH (ref 70–99)
Potassium: 3.7 mmol/L (ref 3.5–5.1)
Sodium: 136 mmol/L (ref 135–145)

## 2021-11-22 LAB — CBC
HCT: 34.6 % — ABNORMAL LOW (ref 39.0–52.0)
Hemoglobin: 11.4 g/dL — ABNORMAL LOW (ref 13.0–17.0)
MCH: 32.1 pg (ref 26.0–34.0)
MCHC: 32.9 g/dL (ref 30.0–36.0)
MCV: 97.5 fL (ref 80.0–100.0)
Platelets: 291 10*3/uL (ref 150–400)
RBC: 3.55 MIL/uL — ABNORMAL LOW (ref 4.22–5.81)
RDW: 14.7 % (ref 11.5–15.5)
WBC: 9.1 10*3/uL (ref 4.0–10.5)
nRBC: 0 % (ref 0.0–0.2)

## 2021-11-22 LAB — GLUCOSE, CAPILLARY
Glucose-Capillary: 113 mg/dL — ABNORMAL HIGH (ref 70–99)
Glucose-Capillary: 109 mg/dL — ABNORMAL HIGH (ref 70–99)
Glucose-Capillary: 128 mg/dL — ABNORMAL HIGH (ref 70–99)

## 2021-11-22 LAB — PREALBUMIN: Prealbumin: 8.5 mg/dL — ABNORMAL LOW (ref 18–38)

## 2021-11-22 LAB — HEPARIN LEVEL (UNFRACTIONATED): Heparin Unfractionated: 0.69 IU/mL (ref 0.30–0.70)

## 2021-11-22 LAB — MAGNESIUM: Magnesium: 1.7 mg/dL (ref 1.7–2.4)

## 2021-11-22 MED ORDER — IOHEXOL 300 MG/ML  SOLN
100.0000 mL | Freq: Once | INTRAMUSCULAR | Status: AC | PRN
  Administered 2021-11-22: 100 mL via INTRAVENOUS

## 2021-11-22 MED ORDER — FUROSEMIDE 10 MG/ML IJ SOLN
40.0000 mg | Freq: Every day | INTRAMUSCULAR | Status: DC
  Administered 2021-11-24: 40 mg via INTRAVENOUS
  Filled 2021-11-22: qty 4

## 2021-11-22 MED ORDER — SODIUM CHLORIDE 0.9 % IV SOLN
3.0000 g | Freq: Four times a day (QID) | INTRAVENOUS | Status: DC
  Administered 2021-11-22 – 2021-11-23 (×4): 3 g via INTRAVENOUS
  Filled 2021-11-22 (×4): qty 8

## 2021-11-22 NOTE — Progress Notes (Signed)
Subjective:  Frustration with the duration of his therapy and desire for definitive treatment   Antibiotics:  Anti-infectives (From admission, onward)    Start     Dose/Rate Route Frequency Ordered Stop   11/22/21 1200  Ampicillin-Sulbactam (UNASYN) 3 g in sodium chloride 0.9 % 100 mL IVPB        3 g 200 mL/hr over 30 Minutes Intravenous Every 6 hours 11/22/21 0923     11/20/21 1645  micafungin (MYCAMINE) 100 mg in sodium chloride 0.9 % 100 mL IVPB        100 mg 105 mL/hr over 1 Hours Intravenous Every 24 hours 11/20/21 1554     11/08/21 0730  vancomycin (VANCOREADY) IVPB 1750 mg/350 mL        1,750 mg 175 mL/hr over 120 Minutes Intravenous  Once 11/08/21 0641 11/08/21 0908   11/08/21 0641  vancomycin variable dose per unstable renal function (pharmacist dosing)  Status:  Discontinued         Does not apply See admin instructions 11/08/21 0641 11/08/21 1138   11/07/21 2000  piperacillin-tazobactam (ZOSYN) IVPB 3.375 g  Status:  Discontinued        3.375 g 12.5 mL/hr over 240 Minutes Intravenous Every 8 hours 11/07/21 1921 11/22/21 0923   11/07/21 1430  piperacillin-tazobactam (ZOSYN) IVPB 3.375 g        3.375 g 100 mL/hr over 30 Minutes Intravenous  Once 11/07/21 1418 11/07/21 1535       Medications: Scheduled Meds:  amLODipine  5 mg Oral Daily   Chlorhexidine Gluconate Cloth  6 each Topical Daily   feeding supplement  237 mL Oral BID BM   [START ON 11/23/2021] furosemide  40 mg Intravenous Daily   insulin aspart  0-15 Units Subcutaneous TID WC   lip balm  1 application  Topical BID   mouth rinse  15 mL Mouth Rinse q12n4p   metoprolol succinate  50 mg Oral Daily   pantoprazole  40 mg Oral BID   sodium chloride flush  10-40 mL Intracatheter Q12H   sodium chloride flush  5 mL Intracatheter Q8H   Continuous Infusions:  sodium chloride 250 mL (11/08/21 0653)   sodium chloride Stopped (11/09/21 1144)   sodium chloride 10 mL/hr at 11/19/21 0600    ampicillin-sulbactam (UNASYN) IV 3 g (11/22/21 1343)   heparin 2,750 Units/hr (11/22/21 1824)   micafungin (MYCAMINE) 100 mg in sodium chloride 0.9 % 100 mL IVPB 100 mg (11/22/21 1827)   ondansetron (ZOFRAN) IV     PRN Meds:.Place/Maintain arterial line **AND** sodium chloride, sodium chloride, acetaminophen **OR** [DISCONTINUED] acetaminophen, alum & mag hydroxide-simeth, dextromethorphan-guaiFENesin, hydrALAZINE, ipratropium-albuterol, magic mouthwash, menthol-cetylpyridinium, metoprolol tartrate, ondansetron (ZOFRAN) IV **OR** ondansetron (ZOFRAN) IV, mouth rinse, oxyCODONE, phenol, prochlorperazine, senna-docusate, simethicone, sodium chloride flush, traZODone    Objective: Weight change:   Intake/Output Summary (Last 24 hours) at 11/22/2021 1955 Last data filed at 11/22/2021 1905 Gross per 24 hour  Intake 1463.9 ml  Output 3450 ml  Net -1986.1 ml   Blood pressure (!) 156/71, pulse 66, temperature 98.6 F (37 C), temperature source Oral, resp. rate 20, height 5' 6.93" (1.7 m), weight 121.5 kg, SpO2 97 %. Temp:  [98.6 F (37 C)-99.1 F (37.3 C)] 98.6 F (37 C) (06/19 1311) Pulse Rate:  [63-69] 66 (06/19 1311) Resp:  [15-20] 20 (06/19 1311) BP: (151-156)/(71-81) 156/71 (06/19 1311) SpO2:  [95 %-97 %] 97 % (06/19 1311)  Physical Exam: Physical Exam Constitutional:  Appearance: He is well-developed. He is obese.  HENT:     Head: Normocephalic and atraumatic.  Eyes:     Conjunctiva/sclera: Conjunctivae normal.  Cardiovascular:     Rate and Rhythm: Normal rate and regular rhythm.     Heart sounds:     Gallop present.  Pulmonary:     Effort: Pulmonary effort is normal. No respiratory distress.     Breath sounds: No wheezing.  Abdominal:     General: There is distension.     Palpations: Abdomen is soft.     Tenderness: There is abdominal tenderness.  Musculoskeletal:        General: Normal range of motion.     Cervical back: Normal range of motion and neck supple.   Skin:    General: Skin is warm and dry.     Findings: No erythema or rash.  Neurological:     General: No focal deficit present.     Mental Status: He is alert and oriented to person, place, and time.  Psychiatric:        Mood and Affect: Mood normal.        Behavior: Behavior normal.        Thought Content: Thought content normal.        Judgment: Judgment normal.     Drains in place  CBC:    BMET Recent Labs    11/21/21 0527 11/22/21 0435  NA 135 136  K 3.6 3.7  CL 104 105  CO2 22 24  GLUCOSE 106* 113*  BUN 13 12  CREATININE 1.35* 1.39*  CALCIUM 8.2* 8.3*     Liver Panel  No results for input(s): "PROT", "ALBUMIN", "AST", "ALT", "ALKPHOS", "BILITOT", "BILIDIR", "IBILI" in the last 72 hours.     Sedimentation Rate No results for input(s): "ESRSEDRATE" in the last 72 hours. C-Reactive Protein No results for input(s): "CRP" in the last 72 hours.  Micro Results: Recent Results (from the past 720 hour(s))  Culture, blood (routine x 2)     Status: None   Collection Time: 11/07/21  3:10 PM   Specimen: BLOOD RIGHT FOREARM  Result Value Ref Range Status   Specimen Description   Final    BLOOD RIGHT FOREARM Performed at [facility], [address] [place], [place] [address]    Special Requests   Final    BOTTLES DRAWN AEROBIC AND ANAEROBIC Blood Culture adequate volume Performed at [facility], [address], [place], [place] [address]    Culture   Final    NO GROWTH 5 DAYS Performed at [facility], [place] [address] [place], [place] [address]    Report Status 11/12/2021 FINAL  Final  Culture, blood (routine x 2)     Status: None   Collection Time: 11/07/21  3:12 PM   Specimen: BLOOD LEFT FOREARM  Result Value Ref Range Status   Specimen Description   Final    BLOOD LEFT FOREARM Performed at [facility], [address], [place], [place] [address]    Special Requests   Final     BOTTLES DRAWN AEROBIC AND ANAEROBIC Blood Culture adequate volume Performed at [facility], [address], [place], [place] [address]    Culture   Final    NO GROWTH 5 DAYS Performed at [facility], [place] [address], [place], [place] [address]    Report Status 11/12/2021 FINAL  Final  Surgical PCR screen     Status: None  Collection Time: 11/08/21  2:06 AM   Specimen: Nasal Mucosa; Nasal Swab  Result Value Ref Range Status   MRSA, PCR NEGATIVE NEGATIVE Final   Staphylococcus aureus NEGATIVE NEGATIVE Final    Comment: (NOTE) The Xpert SA Assay (FDA approved for NASAL specimens in patients 36 years of age and older), is one component of a comprehensive surveillance program. It is not intended to diagnose infection nor to guide or monitor treatment. Performed at [facility], [place] [address] [place], [place] [address]   Aerobic/Anaerobic Culture w Gram Stain (surgical/deep wound)     Status: None   Collection Time: 11/08/21  1:25 PM   Specimen: Abscess  Result Value Ref Range Status   Specimen Description   Final    ABSCESS DEEP PELVIC DRAIN Performed at [facility] [address] [place], [place] [address]    Special Requests   Final    NONE Performed at [facility], [place] [address] [place], [place] [address]    Gram Stain   Final    RARE WBC PRESENT,BOTH PMN AND MONONUCLEAR ABUNDANT GRAM POSITIVE COCCI IN PAIRS MODERATE GRAM NEGATIVE RODS RARE GRAM POSITIVE RODS Performed at [facility], [place] [address], [place], [place] [address]    Culture   Final    MODERATE ESCHERICHIA COLI MIXED ANAEROBIC FLORA PRESENT.  CALL LAB IF FURTHER IID REQUIRED.    Report Status 11/11/2021 FINAL  Final   Organism ID, Bacteria ESCHERICHIA COLI  Final      Susceptibility   Escherichia coli - MIC*    AMPICILLIN 4 SENSITIVE Sensitive     CEFAZOLIN <=4 SENSITIVE Sensitive     CEFEPIME <=0.12  SENSITIVE Sensitive     CEFTAZIDIME <=1 SENSITIVE Sensitive     CEFTRIAXONE <=0.25 SENSITIVE Sensitive     CIPROFLOXACIN <=0.25 SENSITIVE Sensitive     GENTAMICIN <=1 SENSITIVE Sensitive     IMIPENEM <=0.25 SENSITIVE Sensitive     TRIMETH/SULFA <=20 SENSITIVE Sensitive     AMPICILLIN/SULBACTAM <=2 SENSITIVE Sensitive     PIP/TAZO <=4 SENSITIVE Sensitive     * MODERATE ESCHERICHIA COLI  Aerobic/Anaerobic Culture w Gram Stain (surgical/deep wound)     Status: None (Preliminary result)   Collection Time: 11/18/21  4:08 PM   Specimen: Abscess  Result Value Ref Range Status   Specimen Description ABSCESS ABDOMEN  Final   Special Requests LEFT LATERAL ABDOMEN DRAIN NO 2  Final   Gram Stain   Final    NO SQUAMOUS EPITHELIAL CELLS SEEN ABUNDANT WBC SEEN NO ORGANISMS SEEN    Culture   Final    RARE CANDIDA TROPICALIS NO ANAEROBES ISOLATED Performed at [facility], [address] [place], [place] [address]    Report Status PENDING  Incomplete  Aerobic/Anaerobic Culture w Gram Stain (surgical/deep wound)     Status: None (Preliminary result)   Collection Time: 11/18/21  4:08 PM   Specimen: Abscess  Result Value Ref Range Status   Specimen Description ABSCESS ABDOMEN  Final   Special Requests RT LOWER QUAD DRAIN NO 1  Final   Gram Stain   Final    NO SQUAMOUS EPITHELIAL CELLS SEEN MODERATE WBC SEEN NO ORGANISMS SEEN    Culture   Final    NO GROWTH 4 DAYS NO ANAEROBES ISOLATED; CULTURE IN PROGRESS FOR 5 DAYS Performed at [facility], [place] [address], [place], [place] [address]    Report Status PENDING  Incomplete  Studies/Results: CT ABDOMEN PELVIS W CONTRAST  Result Date: 11/22/2021 CLINICAL DATA:  Abdominal pain. EXAM: CT ABDOMEN AND PELVIS WITH CONTRAST TECHNIQUE: Multidetector CT imaging of the abdomen and pelvis was performed using the standard protocol following bolus administration of intravenous contrast. RADIATION DOSE REDUCTION: This exam was performed  according to the departmental dose-optimization program which includes automated exposure control, adjustment of the mA and/or kV according to patient size and/or use of iterative reconstruction technique. CONTRAST:  170m OMNIPAQUE IOHEXOL 300 MG/ML  SOLN COMPARISON:  11/18/2021 and earlier FINDINGS: Lower chest: There are bilateral pleural effusions. Bibasilar atelectasis and LEFT LOWER lobe consolidation are similar to prior study. Heart size is normal. Hepatobiliary: Cirrhotic contour of the liver. A 3.1 centimeter low-attenuation lesion is identified in the posterior segment of the RIGHT hepatic lobe, not seen on prior noncontrast exams. Further evaluation is indicated. Gallbladder is distended.  No radiopaque calculi. Pancreas: Unremarkable. No pancreatic ductal dilatation or surrounding inflammatory changes. Spleen: Normal in size without focal abnormality. Adrenals/Urinary Tract: A 1.1 centimeter nodule in the RIGHT adrenal gland is stable. A 2.5 centimeter nodule in the LEFT adrenal gland is stable. Numerous bilateral renal cysts are present. Largest is in the LEFT kidney, 5.9 centimeters. No hydronephrosis. Ureters are unremarkable. Urinary bladder is distended and otherwise normal. Stomach/Bowel: Stomach and small bowel loops are normal in appearance. Contrast is seen in the colon to the level of the anus. There are numerous colonic diverticula. There is focal wall thickening in the region of the redundant sigmoid colon. There is moderate mesenteric edema. Small amount of mesenteric fluid identified adjacent to the hepatic segment of the colon and in the RIGHT UPPER QUADRANT, without evidence for abscess. The largest fluid collection is 7.3 x 2.9 x 2.0 centimeters. RIGHT LOWER QUADRANT a drain is in place. No significant fluid collections around the drain. There has been almost complete resolution of air-fluid collection between the bladder in the rectum (image 69 of series 2 and 74 of series 6). This  collection now measures 6.2 by 2.2 centimeters. Previously, this measured 9.4 x 6.6 centimeters. Stable appearance of small air-fluid collection in the LEFT central pelvis measuring 6.4 x 3.5 centimeters (image 57 of series 2). Significantly smaller oval collection of fluid contains pigtail drain in the LEFT mid abdomen, now measuring 5.5 x 1.7 centimeters and previously 6.3 centimeters. (Image 46 of series 2). Vascular/Lymphatic: There is atherosclerotic calcification of the abdominal aorta. Small para aortic lymph nodes are likely reactive. Reproductive: Prostate is unremarkable. Other: There is edema of the LATERAL abdominal wall bilaterally, LEFT greater than RIGHT. Musculoskeletal: Degenerative changes in the LOWER lumbar spine. Prior LEFT hip arthroplasty. IMPRESSION: 1. Improving multiple fluid collections within the abdomen and pelvis. 2. No significant fluid around the RIGHT LOWER lobe drain. 3. Small fluid around the LEFT LOWER lobe drain, with significant improvement in this collection. 4. Near complete resolution of air fluid collection between the rectum and the bladder. 5. Stable small collection in the central pelvis. 6. Diverticulosis. 7. Indeterminate low-attenuation lesion in the RIGHT hepatic lobe measuring 3.1 centimeters. This has not been fully characterized. In the setting of cirrhosis, further characterization is needed. Recommend non emergent outpatient MRI of the liver. 8.  Aortic atherosclerosis.  (ICD10-I70.0) 9. Abdominal wall edema. 10. Bilateral pleural effusions, bibasilar atelectasis, and LEFT LOWER lobe consolidation are similar. 11. Stable bilateral adrenal nodules. Electronically Signed   By: ENolon NationsM.D.   On: 11/22/2021 12:02      Assessment/Plan:  INTERVAL  HISTORY: Patient had repeat CT today   Principal Problem:   Intra-abdominal abscesses s/p perc drainage 11/08/2021 Active Problems:   Essential hypertension   Type 2 diabetes mellitus with morbid obesity  (HCC)   Diverticulosis   Hyponatremia   Acute renal failure superimposed on chronic kidney disease (HCC)   Sleep apnea   Osteoarthritis   CKD (chronic kidney disease) stage 3, GFR 30-59 ml/min (HCC)   Adrenal nodule (HCC)   History of adenomatous polyp of colon   Spinal stenosis of lumbar region   Renal cyst   Cholelithiasis   Coffee ground emesis   Septic shock ([place])   Tobacco abuse     HA PLACERES is a 66 y.o. male with with history of chronic kidney disease admitted to the ICU with septic shock and found to have multiloculated fluid collections on the diverticular microperforation status post IR placement in June 13 and on June 15 with E. coli and Candida tropicalis isolated.  He continues to be followed close by general surgery and remains on effective antibiotics.  #1 intra-abdominal abscesses from ruptured diverticula:  We do not need Zosyn since his bacterial pathogens were sensitive to ampicillin sulbactam so we will narrow to this but to continue with Eraxis for covering his Candida tropicalis isolated on culture There have been ongoing discussions with regards to need to proceed to general surgery potential Hartman's procedure.  Repeat CT of the abdomen pelvis for today was planned   LOS: 15 days   Alcide Evener 11/22/2021, 7:55 PM

## 2021-11-22 NOTE — Progress Notes (Signed)
Subjective/Chief Complaint: No significant abdominal pain Tolerating soft diet Afebrile/ normal WBC Had a BM   Objective: Vital signs in last 24 hours: Temp:  [98.4 F (36.9 C)-99.1 F (37.3 C)] 98.9 F (37.2 C) (06/19 0535) Pulse Rate:  [66-69] 66 (06/19 0535) Resp:  [15-17] 15 (06/19 0535) BP: (142-152)/(74-84) 151/81 (06/19 0535) SpO2:  [95 %-97 %] 95 % (06/19 0535) Last BM Date : 11/19/21  Intake/Output from previous day: 06/18 0701 - 06/19 0700 In: 761.2 [P.O.:480; I.V.:156.7; IV Piggyback:114.5] Out: 3480 [Urine:3450; Drains:30] Intake/Output this shift: No intake/output data recorded.  General: pleasant, WD, obese male Lungs: normal effort  Abd: soft, NT, ND, LLQ drain with serous looking fluid, RLQ drain with bloody purulent appearing fluid Psych: A&Ox3 with an appropriate affect.     Lab Results:  Recent Labs    11/21/21 0527 11/22/21 0435  WBC 8.7 9.1  HGB 11.4* 11.4*  HCT 35.1* 34.6*  PLT 285 291   BMET Recent Labs    11/21/21 0527 11/22/21 0435  NA 135 136  K 3.6 3.7  CL 104 105  CO2 22 24  GLUCOSE 106* 113*  BUN 13 12  CREATININE 1.35* 1.39*  CALCIUM 8.2* 8.3*   PT/INR No results for input(s): "LABPROT", "INR" in the last 72 hours. ABG No results for input(s): "PHART", "HCO3" in the last 72 hours.  Invalid input(s): "PCO2", "PO2"  Studies/Results: No results found.  Anti-infectives: Anti-infectives (From admission, onward)    Start     Dose/Rate Route Frequency Ordered Stop   11/22/21 1200  Ampicillin-Sulbactam (UNASYN) 3 g in sodium chloride 0.9 % 100 mL IVPB        3 g 200 mL/hr over 30 Minutes Intravenous Every 6 hours 11/22/21 0923     11/20/21 1645  micafungin (MYCAMINE) 100 mg in sodium chloride 0.9 % 100 mL IVPB        100 mg 105 mL/hr over 1 Hours Intravenous Every 24 hours 11/20/21 1554     11/08/21 0730  vancomycin (VANCOREADY) IVPB 1750 mg/350 mL        1,750 mg 175 mL/hr over 120 Minutes Intravenous  Once  11/08/21 0641 11/08/21 0908   11/08/21 0641  vancomycin variable dose per unstable renal function (pharmacist dosing)  Status:  Discontinued         Does not apply See admin instructions 11/08/21 0641 11/08/21 1138   11/07/21 2000  piperacillin-tazobactam (ZOSYN) IVPB 3.375 g  Status:  Discontinued        3.375 g 12.5 mL/hr over 240 Minutes Intravenous Every 8 hours 11/07/21 1921 11/22/21 0923   11/07/21 1430  piperacillin-tazobactam (ZOSYN) IVPB 3.375 g        3.375 g 100 mL/hr over 30 Minutes Intravenous  Once 11/07/21 1418 11/07/21 1535       Assessment/Plan: Multiple intraabdominal abscesses likely secondary to diverticular disease - s/p IR drain placement 6/5 - drainage purulent, Cx with pan-sensitive E.coli  - repeat CT AP 6/9 - with resolution of large abscess but some persistent interloop collections  - IR removed TG drain 6/13 - WBC 9 - repeat CT 6/14 with recurrent pelvic abscess, larger collection in the left paracolic gutter - s/p placement of LLQ and RLQ drains yesterday - RLQ drain with some concern for some feculent looking material in tubing, bulb more bloody-purulent, LLQ drain looks serous - repeat CT today.  Patient is essentially failing to really improve at this point.  Will see what scan shows, but likely needs  Hartmann's procedure. -discussed at length with patient and daughter at bedside.   FEN: low fiber diet, IVF per [facility] VTE: SCDs, hep gtt ID: Zosyn 6/4>>   - per CCM -  A. 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

## 2021-11-22 NOTE — Consult Note (Signed)
[facility] Nurse ostomy consult note [facility] Nurse requested for preoperative stoma site marking by Dr. Donne Hazel.  Confirmed with [facility] PA-C  K. Maxwell Caul that [place] can be performed in the early am tomorrow (11/23/21).    Thank you for inviting [place] to participate in this patient's Plan of Care.  Maudie Flakes, MSN, RN, CNS, [place], Serita Grammes, [employer], [employer] phone:  [phone]

## 2021-11-22 NOTE — TOC Progression Note (Signed)
Transition of Care [facility]) - Progression Note    Patient Details  Name: Robert Larson MRN: [id number] Date of Birth: 01/28/1956  Transition of Care [facility]) CM/SW Contact  Leeroy Cha, RN Phone Number: 11/22/2021, 10:30 AM  Clinical Narrative:    Chart reviewed. Following for toc needs.     Barriers to Discharge: Continued Medical Work up  Expected Discharge Plan and Services                                                 Social Determinants of Health (SDOH) Interventions    Readmission Risk Interventions     No data to display

## 2021-11-22 NOTE — Progress Notes (Signed)
Referring Physician(s): Tsuei,M  Supervising Physician: Markus Daft  Patient Status:  [facility] - In-pt  Chief Complaint:   Pelvic/intraabd abscess most likely due to diverticular  - s/p R TG drain placement on 11/08/21 by Dr. Maryelizabeth Kaufmann - removed on 6/13 - S/p 2 intraabdominal drain placement by Dr. Anselm Pancoast on 11/18/21.  Subjective:  Pt is a pleasant 66 yo male, sitting upright in chair during exam. Nursing at bedside throughout exam. Pt states he feels better today than he did yesterday. He denies N/V/D, chills, fever, and abdominal pain. He does note that he was told by nursing staff overnight that his dressings over his RLQ drain and his L lateral abdomen drain were bloody, but he states he did not notice this.   Allergies: Patient has no known allergies.  Medications: Prior to Admission medications   Medication Sig Start Date End Date Taking? Authorizing Provider  FARXIGA 10 MG TABS tablet Take 10 mg by mouth daily.   Yes [provider]  metoprolol succinate (TOPROL-XL) 25 MG 24 hr tablet Take 1 tablet (25 mg total) by mouth at bedtime. START with 1/2 pill for one week, then increase to 1 full pill. Patient taking differently: Take 25 mg by mouth at bedtime. 10/18/21  Yes Jeanie Sewer, NP  dapagliflozin propanediol (FARXIGA) 5 MG TABS tablet Take 1 tablet (5 mg total) by mouth daily. Patient not taking: Reported on 11/07/2021 10/14/21   Jeanie Sewer, NP  Dulaglutide (TRULICITY) 1.5 DX/8.3JA SOPN Inject 1.5 mg into the skin once a week. Start after you run out of the 0.'75mg'$  dose x 2 each week. Patient not taking: Reported on 11/07/2021 10/14/21   Jeanie Sewer, NP  metFORMIN (GLUCOPHAGE-XR) 500 MG 24 hr tablet TAKE 1 TABLET BY MOUTH EVERY DAY WITH BREAKFAST Patient not taking: Reported on 11/07/2021 10/14/21   Jeanie Sewer, NP  Omega-3 Fatty Acids (FISH OIL PO) Take 2 capsules by mouth daily. Patient not taking: Reported on 11/07/2021    [provider]   telmisartan-hydrochlorothiazide (MICARDIS HCT) 80-25 MG tablet Take 1 tablet by mouth daily. Patient not taking: Reported on 11/07/2021    [provider]  varenicline (CHANTIX) 0.5 MG tablet INCREASE TO 2 TABS IN AM, 1 TAB IN PM FOR 1-2 WEEKS, THEN 2 TABS AM AND 2 TABS PM. Patient not taking: Reported on 11/07/2021 10/08/21   Jeanie Sewer, NP     Vital Signs: BP (!) 156/71 (BP Location: Right Arm)   Pulse 66   Temp 98.6 F (37 C) (Oral)   Resp 20   Ht 5' 6.93" (1.7 m)   Wt 267 lb 13.7 oz (121.5 kg)   SpO2 97%   BMI 42.04 kg/m   Physical Exam Vitals and nursing note reviewed.  Constitutional:      General: Patient is not in acute distress.    Appearance: Normal appearance. Patient is not ill-appearing.  HENT:     Head: Normocephalic and atraumatic.  Pulmonary:     Effort: Pulmonary effort is normal.    Abdominal:     Palpations: Abdomen is soft.  Musculoskeletal:     Cervical back: Neck supple.  Skin:    General: Skin is warm and dry.     Coloration: Skin is not jaundiced or pale.  Positive RLQ drain to a suction bub. Site is remarkable for blood clots at the site, but there is no underlying erythema, edema, or tenderness. Suture and stat lock in place. Dressing is clean, dry, and intact. 25 ml  of  bloody fluid noted in the bulb. Drain flushes and aspirates well.  Positive left lateral abdomen drain to a suction bulb. Site is unremarkable with no erythema, edema, tenderness, bleeding or drainage. Suture and stat lock in place. Dressing is clean, dry, and intact. 20 ml of  serosanguinous fluid with debris noted in the bulb. Drain aspirates and flushes well.   Neurological:     Mental Status: Patient is alert and oriented to person, place, and time.  Psychiatric:        Mood and Affect: Mood normal.        Behavior: Behavior normal.        Judgment: Judgment normal.   #1 Drain Location: RLQ Size: Fr size: 12 Fr Date of placement: 6/15  Currently to: Drain  collection device: suction bulb  #2 Drain Location: Lt lateral abd  Size: Fr size: 10 Fr Date of placement: 6/15  Currently to: Drain collection device: suction bulb 24 hour output:  Output by Drain (mL) 11/20/21 0701 - 11/20/21 1900 11/20/21 1901 - 11/21/21 0700 11/21/21 0701 - 11/21/21 1900 11/21/21 1901 - 11/22/21 0700 11/22/21 0701 - 11/22/21 1341  Closed System Drain 1 Right RLQ Bulb (JP) 12 Fr. '10 10 20    '$ Closed System Drain 2 Lateral LLQ Bulb (JP) 10 Fr. '30 10 10    '$ Current examination: RLQ Flushes/aspirates easily Llat flushes/aspirates easily RLQ insertion site has copious blood clots surrounding drain, but not currently actively bleeding.  L lateral insertion site unremarkable. Suture and stat lock in place. Dressed appropriately.   Interval imaging/drain manipulation:  CT Abdomen & Pelvis w Contrast performed 11/22/21 IMPRESSION: 1. Improving multiple fluid collections within the abdomen and pelvis. 2. No significant fluid around the RIGHT LOWER lobe drain. 3. Small fluid around the LEFT LOWER lobe drain, with significant improvement in this collection. 4. Near complete resolution of air fluid collection between the rectum and the bladder. 5. Stable small collection in the central pelvis. 6. Diverticulosis. 7. Indeterminate low-attenuation lesion in the RIGHT hepatic lobe measuring 3.1 centimeters. This has not been fully characterized. In the setting of cirrhosis, further characterization is needed. Recommend non emergent outpatient MRI of the liver. 8.  Aortic atherosclerosis.  (ICD10-I70.0) 9. Abdominal wall edema. 10. Bilateral pleural effusions, bibasilar atelectasis, and LEFT LOWER lobe consolidation are similar. 11. Stable bilateral adrenal nodules.  Imaging: CT ABDOMEN PELVIS W CONTRAST  Result Date: 11/22/2021 CLINICAL DATA:  Abdominal pain. EXAM: CT ABDOMEN AND PELVIS WITH CONTRAST TECHNIQUE: Multidetector CT imaging of the abdomen and pelvis was  performed using the standard protocol following bolus administration of intravenous contrast. RADIATION DOSE REDUCTION: This exam was performed according to the departmental dose-optimization program which includes automated exposure control, adjustment of the mA and/or kV according to patient size and/or use of iterative reconstruction technique. CONTRAST:  15m OMNIPAQUE IOHEXOL 300 MG/ML  SOLN COMPARISON:  11/18/2021 and earlier FINDINGS: Lower chest: There are bilateral pleural effusions. Bibasilar atelectasis and LEFT LOWER lobe consolidation are similar to prior study. Heart size is normal. Hepatobiliary: Cirrhotic contour of the liver. A 3.1 centimeter low-attenuation lesion is identified in the posterior segment of the RIGHT hepatic lobe, not seen on prior noncontrast exams. Further evaluation is indicated. Gallbladder is distended.  No radiopaque calculi. Pancreas: Unremarkable. No pancreatic ductal dilatation or surrounding inflammatory changes. Spleen: Normal in size without focal abnormality. Adrenals/Urinary Tract: A 1.1 centimeter nodule in the RIGHT adrenal gland is stable. A 2.5 centimeter nodule in the LEFT adrenal gland is  stable. Numerous bilateral renal cysts are present. Largest is in the LEFT kidney, 5.9 centimeters. No hydronephrosis. Ureters are unremarkable. Urinary bladder is distended and otherwise normal. Stomach/Bowel: Stomach and small bowel loops are normal in appearance. Contrast is seen in the colon to the level of the anus. There are numerous colonic diverticula. There is focal wall thickening in the region of the redundant sigmoid colon. There is moderate mesenteric edema. Small amount of mesenteric fluid identified adjacent to the hepatic segment of the colon and in the RIGHT UPPER QUADRANT, without evidence for abscess. The largest fluid collection is 7.3 x 2.9 x 2.0 centimeters. RIGHT LOWER QUADRANT a drain is in place. No significant fluid collections around the drain. There  has been almost complete resolution of air-fluid collection between the bladder in the rectum (image 69 of series 2 and 74 of series 6). This collection now measures 6.2 by 2.2 centimeters. Previously, this measured 9.4 x 6.6 centimeters. Stable appearance of small air-fluid collection in the LEFT central pelvis measuring 6.4 x 3.5 centimeters (image 57 of series 2). Significantly smaller oval collection of fluid contains pigtail drain in the LEFT mid abdomen, now measuring 5.5 x 1.7 centimeters and previously 6.3 centimeters. (Image 46 of series 2). Vascular/Lymphatic: There is atherosclerotic calcification of the abdominal aorta. Small para aortic lymph nodes are likely reactive. Reproductive: Prostate is unremarkable. Other: There is edema of the LATERAL abdominal wall bilaterally, LEFT greater than RIGHT. Musculoskeletal: Degenerative changes in the LOWER lumbar spine. Prior LEFT hip arthroplasty. IMPRESSION: 1. Improving multiple fluid collections within the abdomen and pelvis. 2. No significant fluid around the RIGHT LOWER lobe drain. 3. Small fluid around the LEFT LOWER lobe drain, with significant improvement in this collection. 4. Near complete resolution of air fluid collection between the rectum and the bladder. 5. Stable small collection in the central pelvis. 6. Diverticulosis. 7. Indeterminate low-attenuation lesion in the RIGHT hepatic lobe measuring 3.1 centimeters. This has not been fully characterized. In the setting of cirrhosis, further characterization is needed. Recommend non emergent outpatient MRI of the liver. 8.  Aortic atherosclerosis.  (ICD10-I70.0) 9. Abdominal wall edema. 10. Bilateral pleural effusions, bibasilar atelectasis, and LEFT LOWER lobe consolidation are similar. 11. Stable bilateral adrenal nodules. Electronically Signed   By: Nolon Nations M.D.   On: 11/22/2021 12:02   CT IMAGE GUIDED DRAINAGE BY PERCUTANEOUS CATHETER  Result Date: 11/18/2021 INDICATION: 66 year old  with intra-abdominal abscesses, suspect diverticular origin but etiology is unclear. Previous right transgluteal drain was removed. Re-accumulation of an air-fluid collection in the pelvis along with additional collections throughout the abdomen. EXAM: 1. CT-guided placement of a drain in the right lower abdominal abscess 2. CT-guided placement of a drain in left lateral abdominal abscess MEDICATIONS: Moderate sedation ANESTHESIA/SEDATION: Moderate (conscious) sedation was employed during this procedure. A total of Versed 4.'0mg'$  and fentanyl 150 mcg was administered intravenously at the order of the provider performing the procedure. Total intra-service moderate sedation time: 66 minutes. Patient's level of consciousness and vital signs were monitored continuously by radiology nurse throughout the procedure under the supervision of the provider performing the procedure. COMPLICATIONS: None immediate. PROCEDURE: Informed written consent was obtained from the patient after a thorough discussion of the procedural risks, benefits and alternatives. All questions were addressed. Maximal Sterile Barrier Technique was utilized including caps, mask, sterile gowns, sterile gloves, sterile drape, hand hygiene and skin antiseptic. A timeout was performed prior to the initiation of the procedure. Patient was placed in left lateral decubitus position. Images  through the abdomen and pelvis were obtained. The large air-fluid collection the right lower quadrant of the abdomen was targeted. The right side of the abdomen was prepped with chlorhexidine and sterile field was created. Skin was anesthetized with 1% lidocaine. Small incision was made. Using CT guidance, 18 gauge trocar needle was directed into the collection and superstiff Amplatz wire was advanced into the collection. The tract was dilated to accommodate 12 [place] drain. Approximately 80 mL of brown purulent fluid was removed from this collection and follow up CT images  were obtained. This drain appeared to be decompressing not only the right lower quadrant abscess but also the pelvic abscess. Therefore, another transgluteal drain was not placed. The catheter was sutured to skin and attached to a suction bulb. Fluid was collected for culture. The patient was rolled into the onto his back and images were obtained in the supine position. The fluid collection along the lateral left abdomen was targeted. The left side of the abdomen was prepped chlorhexidine and sterile field was created. Maximal barrier sterile technique was utilized including caps, mask, sterile gowns, sterile gloves, sterile drape, hand hygiene and skin antiseptic. Skin was anesthetized with 1% lidocaine. Small incision was made. Using CT guidance, an 18 gauge trocar needle was directed into the collection and cloudy yellow fluid was aspirated. Superstiff Amplatz wire was advanced and the tract was dilated to accommodate a 10 [place] multipurpose drain. Approximately 30 mL of cloudy yellow purulent fluid was removed. Follow up CT images were obtained. Drain was sutured to skin and attached to a suction bulb. Fluid was sent for culture. RADIATION DOSE REDUCTION: This exam was performed according to the departmental dose-optimization program which includes automated exposure control, adjustment of the mA and/or kV according to patient size and/or use of iterative reconstruction technique. FINDINGS: 64 French drain placed in the large gas-filled collection in the right lower quadrant. 80 mL of purulent fluid was removed from this drain. 55 French drain was decompressing the right lower quadrant abscess as well as the pelvic abscess. 10 French drain placed in left lateral abdominal abscess. 30 mL of cloudy yellow fluid was removed from this drain. Enlargement of the right piriformis muscle compatible with a hematoma formation from the previous transgluteal drain. IMPRESSION: 1. CT-guided placement of abscess drain in the  right lower quadrant abscess. This drain appears to be decompressing the right lower quadrant abscess and the pelvic abscess. 2. CT-guided placement of a drainage catheter in the left lateral abdominal abscess. Electronically Signed   By: Markus Daft M.D.   On: 11/18/2021 17:37   CT IMAGE GUIDED DRAINAGE BY PERCUTANEOUS CATHETER  Result Date: 11/18/2021 INDICATION: 66 year old with intra-abdominal abscesses, suspect diverticular origin but etiology is unclear. Previous right transgluteal drain was removed. Re-accumulation of an air-fluid collection in the pelvis along with additional collections throughout the abdomen. EXAM: 1. CT-guided placement of a drain in the right lower abdominal abscess 2. CT-guided placement of a drain in left lateral abdominal abscess MEDICATIONS: Moderate sedation ANESTHESIA/SEDATION: Moderate (conscious) sedation was employed during this procedure. A total of Versed 4.'0mg'$  and fentanyl 150 mcg was administered intravenously at the order of the provider performing the procedure. Total intra-service moderate sedation time: 66 minutes. Patient's level of consciousness and vital signs were monitored continuously by radiology nurse throughout the procedure under the supervision of the provider performing the procedure. COMPLICATIONS: None immediate. PROCEDURE: Informed written consent was obtained from the patient after a thorough discussion of the procedural risks, benefits and  alternatives. All questions were addressed. Maximal Sterile Barrier Technique was utilized including caps, mask, sterile gowns, sterile gloves, sterile drape, hand hygiene and skin antiseptic. A timeout was performed prior to the initiation of the procedure. Patient was placed in left lateral decubitus position. Images through the abdomen and pelvis were obtained. The large air-fluid collection the right lower quadrant of the abdomen was targeted. The right side of the abdomen was prepped with chlorhexidine and  sterile field was created. Skin was anesthetized with 1% lidocaine. Small incision was made. Using CT guidance, 18 gauge trocar needle was directed into the collection and superstiff Amplatz wire was advanced into the collection. The tract was dilated to accommodate 12 [place] drain. Approximately 80 mL of brown purulent fluid was removed from this collection and follow up CT images were obtained. This drain appeared to be decompressing not only the right lower quadrant abscess but also the pelvic abscess. Therefore, another transgluteal drain was not placed. The catheter was sutured to skin and attached to a suction bulb. Fluid was collected for culture. The patient was rolled into the onto his back and images were obtained in the supine position. The fluid collection along the lateral left abdomen was targeted. The left side of the abdomen was prepped chlorhexidine and sterile field was created. Maximal barrier sterile technique was utilized including caps, mask, sterile gowns, sterile gloves, sterile drape, hand hygiene and skin antiseptic. Skin was anesthetized with 1% lidocaine. Small incision was made. Using CT guidance, an 18 gauge trocar needle was directed into the collection and cloudy yellow fluid was aspirated. Superstiff Amplatz wire was advanced and the tract was dilated to accommodate a 10 [place] multipurpose drain. Approximately 30 mL of cloudy yellow purulent fluid was removed. Follow up CT images were obtained. Drain was sutured to skin and attached to a suction bulb. Fluid was sent for culture. RADIATION DOSE REDUCTION: This exam was performed according to the departmental dose-optimization program which includes automated exposure control, adjustment of the mA and/or kV according to patient size and/or use of iterative reconstruction technique. FINDINGS: 55 French drain placed in the large gas-filled collection in the right lower quadrant. 80 mL of purulent fluid was removed from this drain. 59  French drain was decompressing the right lower quadrant abscess as well as the pelvic abscess. 10 French drain placed in left lateral abdominal abscess. 30 mL of cloudy yellow fluid was removed from this drain. Enlargement of the right piriformis muscle compatible with a hematoma formation from the previous transgluteal drain. IMPRESSION: 1. CT-guided placement of abscess drain in the right lower quadrant abscess. This drain appears to be decompressing the right lower quadrant abscess and the pelvic abscess. 2. CT-guided placement of a drainage catheter in the left lateral abdominal abscess. Electronically Signed   By: Markus Daft M.D.   On: 11/18/2021 17:37    Labs:  CBC: Recent Labs    11/19/21 0342 11/20/21 0245 11/21/21 0527 11/22/21 0435  WBC 13.2* 9.8 8.7 9.1  HGB 11.4* 11.0* 11.4* 11.4*  HCT 35.3* 33.7* 35.1* 34.6*  PLT 313 286 285 291    COAGS: Recent Labs    11/08/21 0822 11/09/21 0616 11/12/21 1327  INR 1.6* 2.1* 1.3*    BMP: Recent Labs    11/19/21 0342 11/20/21 0245 11/21/21 0527 11/22/21 0435  NA 134* 134* 135 136  K 3.8 3.5 3.6 3.7  CL 104 106 104 105  CO2 '23 22 22 24  '$ GLUCOSE 97 96 106* 113*  BUN  $'21 16 13 12  'E$ CALCIUM 8.0* 8.1* 8.2* 8.3*  CREATININE 1.42* 1.39* 1.35* 1.39*  GFRNONAA 55* 57* 59* 57*    LIVER FUNCTION TESTS: Recent Labs    11/14/21 0423 11/15/21 0333 11/16/21 0610 11/17/21 0427  BILITOT 1.1 1.1 0.9 1.2  AST 71* 66* 46* 42*  ALT 54* 53* 45* 37  ALKPHOS 48 43 48 51  PROT 6.8 6.3* 6.4* 6.5  ALBUMIN 2.1* 2.0* 2.0* 2.1*    Assessment and Plan:  Mr Emig is a 66 yo male with hx of septic shock and multiple intra-abdominal/pelvic abscesses. S/p right transgluteal drain placed 6/5 by Dr. Maryelizabeth Kaufmann; removed 6/13. S/p RLQ and left lateral abdominal drains placed 6/15. WBC at 9.1, pt is afebrile, culture results are pending. Clots present at RLQ drain site, but no signs of active bleeding at this time. Pharmacy has agreed to lower heparin  dose and we will continue to monitor.   Plan: Continue TID flushes with 5 cc NS. Record output Q shift. Left lateral dressing changes QD or PRN if soiled.  RLQ dressing will be left on for 24 hours and changed tomorrow. Call IR APP or on call IR MD if difficulty flushing or sudden change in drain output.  Repeat imaging/possible drain injection once output < 10 mL/QD (excluding flush material.)  Discharge planning: Please contact IR APP or on call IR MD prior to patient d/c to ensure appropriate follow up plans are in place. Typically patient will follow up with IR clinic 10-14 days post d/c for repeat imaging/possible drain injection. IR scheduler will contact patient with date/time of appointment. Patient will need to flush drain QD with 5 cc NS, record output QD, dressing changes every 2-3 days or earlier if soiled.   IR will continue to follow - please call with questions or concerns.   Electronically Signed: Lura Em, PA-C 11/22/2021, 1:41 PM   I spent a total of 15 Minutes at the the patient's bedside AND on the patient's hospital floor or unit, greater than 50% of which was counseling/coordinating care for abdominal/pelvic abscess drains.

## 2021-11-22 NOTE — Progress Notes (Addendum)
PROGRESS NOTE    Robert Larson  XBL:[id number] DOB: 11/12/1955 DOA: 11/07/2021 PCP: Jeanie Sewer, NP  Brief Narrative: 66 year old with history of DM2, HTN, CKD admitted to the hospital for severe sepsis due to intra-abdominal abscess.    CT abdomen pelvis on admit -showed multiple loculated fluid collection in the lower abdomen with a large cavity in the pelvis measuring 11.5 X6X 11.7 cm.  Due to septic shock he was transferred to the ICU on 6/5.  Patient was seen by IR and drain was placed.   Hospital course was also complicated by acute kidney injury, hypoxia and atrial fibrillation with RVR.   Repeat CT 6/9 -showed resolution of abscess  The drain was removed by IR on 11/16/2021 CT abdomen 11/17/2021-recurrent pelvic abscess. IR placed left lower quadrant and right lower quadrant drains on 11/18/2021 On 11/22/2021 overnight staff reported bandage has become more bloody. Patient is also on Cardizem drip, slowly uptitrating beta-blocker and heparin drip.  Patient started on Lasix 40 mg daily. 11/21/2021 patient has Candida tropicalis growing in the abscess drainage.  ID consulted patient started on micafungin.  Patient has swelling in bilateral lower extremity and some erythema noted.  He was on a diuretic and an ARB at home.  Assessment & Plan:   Principal Problem:   Intra-abdominal abscesses s/p perc drainage 11/08/2021 Active Problems:   Essential hypertension   Type 2 diabetes mellitus with morbid obesity (HCC)   Diverticulosis   Hyponatremia   Acute renal failure superimposed on chronic kidney disease (HCC)   Sleep apnea   Osteoarthritis   CKD (chronic kidney disease) stage 3, GFR 30-59 ml/min (HCC)   Adrenal nodule (HCC)   History of adenomatous polyp of colon   Spinal stenosis of lumbar region   Renal cyst   Cholelithiasis   Coffee ground emesis   Septic shock (HCC)   Tobacco abuse   #1 multiple intra-abdominal abscess likely from diverticular micro  perforation Septic shock secondary to above Patient had IR drain placed which was removed on 11/16/2021. Repeat CT scan 11/17/2021 shows recurrent large abscess 6 x 9 cm. On Zosyn. Patient had IR drain placed  11/18/2021 Follow-up cultures from the drain shows Candida tropicalis.  ID consulted.  Micafungin started.  #2 new onset A-fib with RVR normal TSH normal echo on metoprolol and heparin drip.  Decrease the dose of metoprolol due to bradycardia We will start p.o. anticoagulation once all surgical procedures are over.  #3 AKI on CKD stage IIIa resolved.  #4 transaminitis improving  #5 essential hypertension continue metoprolol dose was decreased yesterday due to bradycardia Added Norvasc 5 mg daily Will monitor closely.  #6 obstructive sleep apnea nocturnal CPAP ordered but he refuses most of the nights  #7 acute hypoxic respiratory distress resolved  #8 mild hyponatremia DC half-normal saline patient is able to tolerate some p.o. intake.  Hyponatremia resolved.  #9 hyperglycemia A1c was 6.1 on April 2023.  Decrease fingersticks to twice a day and cover with SSI. CBG (last 3)  Recent Labs    11/21/21 1221 11/21/21 1605 11/22/21 0749  GLUCAP 127* 119* 113*     #10 bilateral lower extremity edema patient was on a mild diuretic at home I will start him on Lasix 20 mg daily this was not restarted due to sepsis and hypotension on admission.  Patient was on HCTZ and an ARB at home which we will continue to hold.  #11 hypoalbuminemia patient tolerating a soft diet however he was n.p.o. most of  the time.  Dietary consult.  He does have some third spacing due to low albumin.   Estimated body mass index is 42.04 kg/m as calculated from the following:   Height as of this encounter: 5' 6.93" (1.7 m).   Weight as of this encounter: 121.5 kg.  DVT prophylaxis: Heparin  code Status: Full code Family Communication: Discussed with daughter [name] disposition Plan:  Status is:  Inpatient Remains inpatient appropriate because: Recurrent intra-abdominal abscess   Consultants:  General surgery  Procedures: IR placement of intra-abdominal drain 6/ 15 /2023 Antimicrobials:unasyn micofungin  Subjective:  Had some bloody drainage from the drains overnight.  He denies any abdominal pain nausea vomiting or diarrhea.  He is tolerating a soft diet.  He has bilateral lower extremity edema. Objective: Vitals:   11/21/21 1224 11/21/21 2147 11/22/21 0535 11/22/21 1038  BP: (!) 142/84 (!) 152/74 (!) 151/81   Pulse: 68 69 66 63  Resp: '16 17 15   '$ Temp: 98.4 F (36.9 C) 99.1 F (37.3 C) 98.9 F (37.2 C)   TempSrc:      SpO2: 96% 97% 95%   Weight:      Height:        Intake/Output Summary (Last 24 hours) at 11/22/2021 1055 Last data filed at 11/22/2021 1038 Gross per 24 hour  Intake 368.97 ml  Output 3480 ml  Net -3111.03 ml    Filed Weights   11/16/21 2341 11/18/21 2310 11/21/21 0000  Weight: 126.8 kg 126.2 kg 121.5 kg    Examination:  General exam: Appears not in any distress.   Respiratory system: Diminished at the bases to auscultation. Respiratory effort normal. Cardiovascular system: S1 & S2 heard, RRR. No JVD, murmurs, rubs, gallops or clicks. No pedal edema. Gastrointestinal system: Abdomen is distended, soft and nontender. No organomegaly or masses felt. Normal bowel sounds heard. 2 drains in place left lower quadrant and right lower quadrant. Central nervous system: Alert and oriented. No focal neurological deficits. Extremities: 3+ pitting edema Skin: No rashes, lesions or ulcers Psychiatry: Judgement and insight appear normal. Mood & affect appropriate.     Data Reviewed: I have personally reviewed following labs and imaging studies  CBC: Recent Labs  Lab 11/18/21 0438 11/19/21 0342 11/20/21 0245 11/21/21 0527 11/22/21 0435  WBC 14.2* 13.2* 9.8 8.7 9.1  HGB 11.7* 11.4* 11.0* 11.4* 11.4*  HCT 36.4* 35.3* 33.7* 35.1* 34.6*  MCV 98.9  98.3 98.3 97.8 97.5  PLT 268 313 286 285 299    Basic Metabolic Panel: Recent Labs  Lab 11/18/21 0438 11/18/21 0832 11/19/21 0342 11/20/21 0245 11/21/21 0527 11/22/21 0435  NA  --  135 134* 134* 135 136  K  --  3.6 3.8 3.5 3.6 3.7  CL  --  108 104 106 104 105  CO2  --  21* '23 22 22 24  '$ GLUCOSE  --  92 97 96 106* 113*  BUN  --  '22 21 16 13 12  '$ CREATININE  --  1.40* 1.42* 1.39* 1.35* 1.39*  CALCIUM  --  7.9* 8.0* 8.1* 8.2* 8.3*  MG 1.6*  --  2.0 1.8 1.7 1.7    GFR: Estimated Creatinine Clearance: 66.9 mL/min (A) (by C-G formula based on SCr of 1.39 mg/dL (H)). Liver Function Tests: Recent Labs  Lab 11/16/21 0610 11/17/21 0427  AST 46* 42*  ALT 45* 37  ALKPHOS 48 51  BILITOT 0.9 1.2  PROT 6.4* 6.5  ALBUMIN 2.0* 2.1*    No results for input(s): "LIPASE", "  AMYLASE" in the last 168 hours. No results for input(s): "AMMONIA" in the last 168 hours. Coagulation Profile: No results for input(s): "INR", "PROTIME" in the last 168 hours.  Cardiac Enzymes: No results for input(s): "CKTOTAL", "CKMB", "CKMBINDEX", "TROPONINI" in the last 168 hours. BNP (last 3 results) No results for input(s): "PROBNP" in the last 8760 hours. HbA1C: No results for input(s): "HGBA1C" in the last 72 hours. CBG: Recent Labs  Lab 11/21/21 0558 11/21/21 0724 11/21/21 1221 11/21/21 1605 11/22/21 0749  GLUCAP 128* 109* 127* 119* 113*    Lipid Profile: No results for input(s): "CHOL", "HDL", "LDLCALC", "TRIG", "CHOLHDL", "LDLDIRECT" in the last 72 hours. Thyroid Function Tests: No results for input(s): "TSH", "T4TOTAL", "FREET4", "T3FREE", "THYROIDAB" in the last 72 hours. Anemia Panel: No results for input(s): "VITAMINB12", "FOLATE", "FERRITIN", "TIBC", "IRON", "RETICCTPCT" in the last 72 hours. Sepsis Labs: No results for input(s): "PROCALCITON", "LATICACIDVEN" in the last 168 hours.  Recent Results (from the past 240 hour(s))  Aerobic/Anaerobic Culture w Gram Stain (surgical/deep  wound)     Status: None (Preliminary result)   Collection Time: 11/18/21  4:08 PM   Specimen: Abscess  Result Value Ref Range Status   Specimen Description ABSCESS ABDOMEN  Final   Special Requests LEFT LATERAL ABDOMEN DRAIN NO 2  Final   Gram Stain   Final    NO SQUAMOUS EPITHELIAL CELLS SEEN ABUNDANT WBC SEEN NO ORGANISMS SEEN    Culture   Final    RARE CANDIDA TROPICALIS NO ANAEROBES ISOLATED Performed at [facility], [place] [address], [place], [place] [address]    Report Status PENDING  Incomplete  Aerobic/Anaerobic Culture w Gram Stain (surgical/deep wound)     Status: None (Preliminary result)   Collection Time: 11/18/21  4:08 PM   Specimen: Abscess  Result Value Ref Range Status   Specimen Description ABSCESS ABDOMEN  Final   Special Requests RT LOWER QUAD DRAIN NO 1  Final   Gram Stain   Final    NO SQUAMOUS EPITHELIAL CELLS SEEN MODERATE WBC SEEN NO ORGANISMS SEEN    Culture   Final    NO GROWTH 3 DAYS NO ANAEROBES ISOLATED; CULTURE IN PROGRESS FOR 5 DAYS Performed at [facility], [place] [address], [place], [place] [address]    Report Status PENDING  Incomplete         Radiology Studies: No results found.      Scheduled Meds:  amLODipine  5 mg Oral Daily   Chlorhexidine Gluconate Cloth  6 each Topical Daily   feeding supplement  237 mL Oral BID BM   furosemide  20 mg Intravenous Daily   insulin aspart  0-15 Units Subcutaneous TID WC   lip balm  1 application  Topical BID   mouth rinse  15 mL Mouth Rinse q12n4p   metoprolol succinate  50 mg Oral Daily   pantoprazole  40 mg Oral BID   sodium chloride flush  10-40 mL Intracatheter Q12H   sodium chloride flush  5 mL Intracatheter Q8H   Continuous Infusions:  sodium chloride 250 mL (11/08/21 0653)   sodium chloride Stopped (11/09/21 1144)   sodium chloride 10 mL/hr at 11/19/21 0600   ampicillin-sulbactam (UNASYN) IV     heparin 2,750 Units/hr (11/22/21 0900)   micafungin (MYCAMINE) 100 mg  in sodium chloride 0.9 % 100 mL IVPB 100 mg (11/21/21 1634)   ondansetron (ZOFRAN) IV       LOS: 15 days    Time spent:  35 minutes  Georgette Shell, MD  11/22/2021, 10:55 AM

## 2021-11-22 NOTE — Progress Notes (Signed)
PT Cancellation Note  Patient Details Name: Robert Larson MRN: [id number] DOB: Dec 15, 1955   Cancelled Treatment:     pt out of room for another ABD CT with Contrast.  Will attempt to see another day.   Rica Koyanagi  PTA Acute  Rehabilitation Services Office M-F          [phone] Weekend pager [phone]

## 2021-11-22 NOTE — Progress Notes (Signed)
ANTICOAGULATION CONSULT NOTE - Follow Up Consult  Pharmacy Consult for Heparin Indication: atrial fibrillation  No Known Allergies  Patient Measurements: Height: 5' 6.93" (170 cm) Weight: 121.5 kg (267 lb 13.7 oz) IBW/kg (Calculated) : 65.94 Heparin Dosing Weight:    Vital Signs: Temp: 99.1 F (37.3 C) (06/18 2147) BP: 152/74 (06/18 2147) Pulse Rate: 69 (06/18 2147)  Labs: Recent Labs    11/20/21 0245 11/20/21 1253 11/21/21 0527 11/22/21 0435  HGB 11.0*  --  11.4* 11.4*  HCT 33.7*  --  35.1* 34.6*  PLT 286  --  285 291  HEPARINUNFRC 0.19* 0.42 0.64 0.69  CREATININE 1.39*  --  1.35*  --      Estimated Creatinine Clearance: 68.9 mL/min (A) (by C-G formula based on SCr of 1.35 mg/dL (H)).   Assessment: 66 yo male presented with severe sepsis 2/2 intra-abdominal abscess.  Patient now with new onset atrial fibrillation w/ RVR.  Pharmacy consulted to dose heparin IV for Afib.  Today, 11/22/21: - HL 0.69 - therapeutic on 2800 units/hr, however is trending up towards upper end of goal - Hgb stable (11.4), plts WNL - No bleeding per RN  Goal of Therapy:  Heparin level 0.3-0.7 units/ml Monitor platelets by anticoagulation protocol: Yes   Plan:  Reduce heparin drip slightly to 2750 units/hr Daily HL and CBC GI problems limiting transition to [place] 11/22/2021, 5:19 AM

## 2021-11-23 ENCOUNTER — Inpatient Hospital Stay (HOSPITAL_COMMUNITY): Payer: Commercial Managed Care - HMO | Admitting: Anesthesiology

## 2021-11-23 ENCOUNTER — Encounter (HOSPITAL_COMMUNITY): Payer: Self-pay | Admitting: Internal Medicine

## 2021-11-23 ENCOUNTER — Other Ambulatory Visit: Payer: Self-pay

## 2021-11-23 ENCOUNTER — Inpatient Hospital Stay (HOSPITAL_COMMUNITY): Payer: Commercial Managed Care - HMO

## 2021-11-23 ENCOUNTER — Encounter (HOSPITAL_COMMUNITY)
Admission: EM | Disposition: A | Discharge status: Home Health | Payer: Self-pay | Source: Home / Self Care | Attending: Internal Medicine

## 2021-11-23 DIAGNOSIS — K5792 Diverticulitis of intestine, part unspecified, without perforation or abscess without bleeding: Secondary | ICD-10-CM | POA: Diagnosis not present

## 2021-11-23 DIAGNOSIS — N179 Acute kidney failure, unspecified: Secondary | ICD-10-CM | POA: Diagnosis not present

## 2021-11-23 DIAGNOSIS — I509 Heart failure, unspecified: Secondary | ICD-10-CM

## 2021-11-23 DIAGNOSIS — I130 Hypertensive heart and chronic kidney disease with heart failure and stage 1 through stage 4 chronic kidney disease, or unspecified chronic kidney disease: Secondary | ICD-10-CM | POA: Diagnosis not present

## 2021-11-23 DIAGNOSIS — N1832 Chronic kidney disease, stage 3b: Secondary | ICD-10-CM

## 2021-11-23 DIAGNOSIS — F17210 Nicotine dependence, cigarettes, uncomplicated: Secondary | ICD-10-CM

## 2021-11-23 DIAGNOSIS — K651 Peritoneal abscess: Secondary | ICD-10-CM | POA: Diagnosis not present

## 2021-11-23 DIAGNOSIS — N189 Chronic kidney disease, unspecified: Secondary | ICD-10-CM | POA: Diagnosis not present

## 2021-11-23 HISTORY — PX: CYSTOSCOPY WITH STENT PLACEMENT: SHX5790

## 2021-11-23 HISTORY — PX: LAPAROTOMY: SHX154

## 2021-11-23 LAB — COMPREHENSIVE METABOLIC PANEL
ALT: 15 U/L (ref 0–44)
AST: 19 U/L (ref 15–41)
Albumin: 1.9 g/dL — ABNORMAL LOW (ref 3.5–5.0)
Alkaline Phosphatase: 37 U/L — ABNORMAL LOW (ref 38–126)
Anion gap: 7 (ref 5–15)
BUN: 13 mg/dL (ref 8–23)
CO2: 25 mmol/L (ref 22–32)
Calcium: 8.3 mg/dL — ABNORMAL LOW (ref 8.9–10.3)
Chloride: 103 mmol/L (ref 98–111)
Creatinine, Ser: 1.29 mg/dL — ABNORMAL HIGH (ref 0.61–1.24)
GFR, Estimated: >60 mL/min (ref >60)
Glucose, Bld: 111 mg/dL — ABNORMAL HIGH (ref 70–99)
Potassium: 3.6 mmol/L (ref 3.5–5.1)
Sodium: 135 mmol/L (ref 135–145)
Total Bilirubin: 0.8 mg/dL (ref 0.3–1.2)
Total Protein: 6.8 g/dL (ref 6.5–8.1)

## 2021-11-23 LAB — CBC
HCT: 32.3 % — ABNORMAL LOW (ref 39.0–52.0)
HCT: 29.9 % — ABNORMAL LOW (ref 39.0–52.0)
Hemoglobin: 10.5 g/dL — ABNORMAL LOW (ref 13.0–17.0)
Hemoglobin: 9.6 g/dL — ABNORMAL LOW (ref 13.0–17.0)
MCH: 31.9 pg (ref 26.0–34.0)
MCH: 32.1 pg (ref 26.0–34.0)
MCHC: 32.5 g/dL (ref 30.0–36.0)
MCHC: 32.1 g/dL (ref 30.0–36.0)
MCV: 98.2 fL (ref 80.0–100.0)
MCV: 100 fL (ref 80.0–100.0)
Platelets: 286 10*3/uL (ref 150–400)
Platelets: 310 10*3/uL (ref 150–400)
RBC: 3.29 MIL/uL — ABNORMAL LOW (ref 4.22–5.81)
RBC: 2.99 MIL/uL — ABNORMAL LOW (ref 4.22–5.81)
RDW: 14.8 % (ref 11.5–15.5)
RDW: 15 % (ref 11.5–15.5)
WBC: 9.2 10*3/uL (ref 4.0–10.5)
WBC: 9.7 10*3/uL (ref 4.0–10.5)
nRBC: 0 % (ref 0.0–0.2)
nRBC: 0 % (ref 0.0–0.2)

## 2021-11-23 LAB — AEROBIC/ANAEROBIC CULTURE W GRAM STAIN (SURGICAL/DEEP WOUND)
Culture: NO GROWTH
Gram Stain: NONE SEEN

## 2021-11-23 LAB — GLUCOSE, CAPILLARY
Glucose-Capillary: 106 mg/dL — ABNORMAL HIGH (ref 70–99)
Glucose-Capillary: 101 mg/dL — ABNORMAL HIGH (ref 70–99)
Glucose-Capillary: 173 mg/dL — ABNORMAL HIGH (ref 70–99)

## 2021-11-23 LAB — HEPARIN LEVEL (UNFRACTIONATED): Heparin Unfractionated: 0.71 IU/mL — ABNORMAL HIGH (ref 0.30–0.70)

## 2021-11-23 SURGERY — LAPAROTOMY, EXPLORATORY
Anesthesia: General

## 2021-11-23 MED ORDER — ONDANSETRON HCL 4 MG/2ML IJ SOLN
4.0000 mg | Freq: Once | INTRAMUSCULAR | Status: DC | PRN
Start: 2021-11-23 — End: 2021-11-23

## 2021-11-23 MED ORDER — MORPHINE SULFATE (PF) 2 MG/ML IV SOLN
1.0000 mg | INTRAVENOUS | Status: DC | PRN
  Administered 2021-11-23 (×2): 2 mg via INTRAVENOUS
  Administered 2021-11-23: 1 mg via INTRAVENOUS
  Administered 2021-11-23 – 2021-11-25 (×7): 2 mg via INTRAVENOUS
  Administered 2021-11-26: 4 mg via INTRAVENOUS
  Administered 2021-11-26 – 2021-11-29 (×2): 2 mg via INTRAVENOUS
  Filled 2021-11-23: qty 1
  Filled 2021-11-23: qty 2
  Filled 2021-11-23 (×9): qty 1
  Filled 2021-11-23: qty 2
  Filled 2021-11-23 (×2): qty 1

## 2021-11-23 MED ORDER — ROCURONIUM BROMIDE 10 MG/ML (PF) SYRINGE
PREFILLED_SYRINGE | INTRAVENOUS | Status: AC
  Filled 2021-11-23: qty 10

## 2021-11-23 MED ORDER — PIPERACILLIN-TAZOBACTAM 3.375 G IVPB
3.3750 g | Freq: Three times a day (TID) | INTRAVENOUS | Status: DC
  Administered 2021-11-23 – 2021-11-24 (×3): 3.375 g via INTRAVENOUS
  Filled 2021-11-23 (×3): qty 50

## 2021-11-23 MED ORDER — FENTANYL CITRATE (PF) 100 MCG/2ML IJ SOLN
INTRAMUSCULAR | Status: AC
  Filled 2021-11-23: qty 2

## 2021-11-23 MED ORDER — MIDAZOLAM HCL 2 MG/2ML IJ SOLN
INTRAMUSCULAR | Status: AC
  Filled 2021-11-23: qty 2

## 2021-11-23 MED ORDER — FENTANYL CITRATE (PF) 100 MCG/2ML IJ SOLN
INTRAMUSCULAR | Status: DC | PRN
  Administered 2021-11-23: 50 ug via INTRAVENOUS
  Administered 2021-11-23: 100 ug via INTRAVENOUS

## 2021-11-23 MED ORDER — KETAMINE HCL-SODIUM CHLORIDE 100-0.9 MG/10ML-% IV SOSY
PREFILLED_SYRINGE | INTRAVENOUS | Status: DC | PRN
  Administered 2021-11-23: 20 mg via INTRAVENOUS
  Administered 2021-11-23: 30 mg via INTRAVENOUS

## 2021-11-23 MED ORDER — OXYCODONE HCL 5 MG PO TABS: 5.0000 mg | ORAL_TABLET | Freq: Once | ORAL | Status: DC | PRN

## 2021-11-23 MED ORDER — METHOCARBAMOL 1000 MG/10ML IJ SOLN
500.0000 mg | Freq: Four times a day (QID) | INTRAVENOUS | Status: DC
  Administered 2021-11-23 – 2021-11-24 (×4): 500 mg via INTRAVENOUS
  Filled 2021-11-23 (×4): qty 500

## 2021-11-23 MED ORDER — DEXAMETHASONE SODIUM PHOSPHATE 10 MG/ML IJ SOLN
INTRAMUSCULAR | Status: AC
  Filled 2021-11-23: qty 1

## 2021-11-23 MED ORDER — ACETAMINOPHEN 10 MG/ML IV SOLN: 1000.0000 mg | Freq: Once | INTRAVENOUS | Status: DC | PRN

## 2021-11-23 MED ORDER — AMISULPRIDE (ANTIEMETIC) 5 MG/2ML IV SOLN: 10.0000 mg | Freq: Once | INTRAVENOUS | Status: DC | PRN

## 2021-11-23 MED ORDER — LACTATED RINGERS IV SOLN: INTRAVENOUS | Status: DC

## 2021-11-23 MED ORDER — EPHEDRINE 5 MG/ML INJ
INTRAVENOUS | Status: AC
  Filled 2021-11-23: qty 5

## 2021-11-23 MED ORDER — IOHEXOL 300 MG/ML  SOLN
INTRAMUSCULAR | Status: DC | PRN
  Administered 2021-11-23 (×2): 10 mL via URETHRAL

## 2021-11-23 MED ORDER — HYDROMORPHONE HCL 1 MG/ML IJ SOLN: 0.2500 mg | INTRAMUSCULAR | Status: DC | PRN

## 2021-11-23 MED ORDER — ALBUMIN HUMAN 5 % IV SOLN
INTRAVENOUS | Status: AC
  Filled 2021-11-23: qty 250

## 2021-11-23 MED ORDER — ONDANSETRON HCL 4 MG/2ML IJ SOLN
INTRAMUSCULAR | Status: AC
  Filled 2021-11-23: qty 2

## 2021-11-23 MED ORDER — ALBUMIN HUMAN 5 % IV SOLN: INTRAVENOUS | Status: DC | PRN

## 2021-11-23 MED ORDER — KETAMINE HCL 50 MG/5ML IJ SOSY
PREFILLED_SYRINGE | INTRAMUSCULAR | Status: AC
  Filled 2021-11-23: qty 5

## 2021-11-23 MED ORDER — SUGAMMADEX SODIUM 500 MG/5ML IV SOLN
INTRAVENOUS | Status: AC
  Filled 2021-11-23: qty 5

## 2021-11-23 MED ORDER — 0.9 % SODIUM CHLORIDE (POUR BTL) OPTIME
TOPICAL | Status: DC | PRN
  Administered 2021-11-23: 2000 mL
  Administered 2021-11-23: 1000 mL

## 2021-11-23 MED ORDER — LACTATED RINGERS IV SOLN: INTRAVENOUS | Status: DC | PRN

## 2021-11-23 MED ORDER — LIDOCAINE 2% (20 MG/ML) 5 ML SYRINGE
INTRAMUSCULAR | Status: DC | PRN
  Administered 2021-11-23: 1.5 mg/kg/h via INTRAVENOUS

## 2021-11-23 MED ORDER — ROCURONIUM BROMIDE 10 MG/ML (PF) SYRINGE
PREFILLED_SYRINGE | INTRAVENOUS | Status: DC | PRN
  Administered 2021-11-23: 100 mg via INTRAVENOUS

## 2021-11-23 MED ORDER — PHENYLEPHRINE 80 MCG/ML (10ML) SYRINGE FOR IV PUSH (FOR BLOOD PRESSURE SUPPORT)
PREFILLED_SYRINGE | INTRAVENOUS | Status: AC
  Filled 2021-11-23: qty 10

## 2021-11-23 MED ORDER — OXYCODONE HCL 5 MG/5ML PO SOLN: 5.0000 mg | Freq: Once | ORAL | Status: DC | PRN

## 2021-11-23 MED ORDER — SUCCINYLCHOLINE CHLORIDE 200 MG/10ML IV SOSY
PREFILLED_SYRINGE | INTRAVENOUS | Status: DC | PRN
  Administered 2021-11-23: 120 mg via INTRAVENOUS

## 2021-11-23 MED ORDER — ONDANSETRON HCL 4 MG/2ML IJ SOLN
INTRAMUSCULAR | Status: DC | PRN
  Administered 2021-11-23: 4 mg via INTRAVENOUS

## 2021-11-23 MED ORDER — SUGAMMADEX SODIUM 500 MG/5ML IV SOLN
INTRAVENOUS | Status: DC | PRN
  Administered 2021-11-23: 400 mg via INTRAVENOUS

## 2021-11-23 MED ORDER — SUCCINYLCHOLINE CHLORIDE 200 MG/10ML IV SOSY
PREFILLED_SYRINGE | INTRAVENOUS | Status: AC
  Filled 2021-11-23: qty 10

## 2021-11-23 MED ORDER — OXYCODONE HCL 5 MG PO TABS
5.0000 mg | ORAL_TABLET | ORAL | Status: DC | PRN
  Administered 2021-11-24 – 2021-11-25 (×5): 10 mg via ORAL
  Administered 2021-11-26 – 2021-11-28 (×3): 5 mg via ORAL
  Administered 2021-11-29: 10 mg via ORAL
  Administered 2021-11-29: 5 mg via ORAL
  Administered 2021-11-30 – 2021-12-07 (×9): 10 mg via ORAL
  Filled 2021-11-23 (×9): qty 2
  Filled 2021-11-23 (×2): qty 1
  Filled 2021-11-23 (×6): qty 2
  Filled 2021-11-23: qty 1
  Filled 2021-11-23 (×2): qty 2
  Filled 2021-11-23: qty 1

## 2021-11-23 MED ORDER — EPHEDRINE SULFATE-NACL 50-0.9 MG/10ML-% IV SOSY
PREFILLED_SYRINGE | INTRAVENOUS | Status: DC | PRN
  Administered 2021-11-23: 10 mg via INTRAVENOUS
  Administered 2021-11-23 (×2): 5 mg via INTRAVENOUS

## 2021-11-23 MED ORDER — PHENYLEPHRINE 80 MCG/ML (10ML) SYRINGE FOR IV PUSH (FOR BLOOD PRESSURE SUPPORT)
PREFILLED_SYRINGE | INTRAVENOUS | Status: DC | PRN
  Administered 2021-11-23 (×3): 80 ug via INTRAVENOUS
  Administered 2021-11-23: 160 ug via INTRAVENOUS
  Administered 2021-11-23: 80 ug via INTRAVENOUS
  Administered 2021-11-23: 160 ug via INTRAVENOUS
  Administered 2021-11-23: 80 ug via INTRAVENOUS

## 2021-11-23 MED ORDER — CHLORHEXIDINE GLUCONATE 0.12 % MT SOLN: 15.0000 mL | Freq: Once | OROMUCOSAL | Status: DC

## 2021-11-23 MED ORDER — DEXAMETHASONE SODIUM PHOSPHATE 10 MG/ML IJ SOLN
INTRAMUSCULAR | Status: DC | PRN
  Administered 2021-11-23: 4 mg via INTRAVENOUS

## 2021-11-23 MED ORDER — LIDOCAINE HCL 2 % IJ SOLN
INTRAMUSCULAR | Status: AC
  Filled 2021-11-23: qty 20

## 2021-11-23 MED ORDER — LIDOCAINE HCL (CARDIAC) PF 100 MG/5ML IV SOSY
PREFILLED_SYRINGE | INTRAVENOUS | Status: DC | PRN
  Administered 2021-11-23: 100 mg via INTRAVENOUS

## 2021-11-23 MED ORDER — HEPARIN (PORCINE) 25000 UT/250ML-% IV SOLN
2650.0000 [IU]/h | INTRAVENOUS | Status: DC
Start: 2021-11-24 — End: 2021-11-23

## 2021-11-23 MED ORDER — MIDAZOLAM HCL 5 MG/5ML IJ SOLN
INTRAMUSCULAR | Status: DC | PRN
  Administered 2021-11-23: 2 mg via INTRAVENOUS

## 2021-11-23 MED ORDER — PROPOFOL 10 MG/ML IV BOLUS
INTRAVENOUS | Status: AC
  Filled 2021-11-23: qty 20

## 2021-11-23 MED ORDER — ORAL CARE MOUTH RINSE: 15.0000 mL | Freq: Once | OROMUCOSAL | Status: DC

## 2021-11-23 MED ORDER — PROPOFOL 10 MG/ML IV BOLUS
INTRAVENOUS | Status: DC | PRN
  Administered 2021-11-23: 160 mg via INTRAVENOUS

## 2021-11-23 MED ORDER — LIDOCAINE HCL (PF) 2 % IJ SOLN
INTRAMUSCULAR | Status: AC
  Filled 2021-11-23: qty 5

## 2021-11-23 SURGICAL SUPPLY — 58 items
ADAPTER GOLDBERG URETERAL (ADAPTER) ×1 IMPLANT
ADPR CATH 15X14FR FL DRN BG (ADAPTER) ×2
APL PRP STRL LF DISP 70% ISPRP (MISCELLANEOUS) ×2
BAG COUNTER SPONGE SURGICOUNT (BAG) ×1 IMPLANT
BAG SPNG CNTER NS LX DISP (BAG) ×2
BAG URO CATCHER STRL LF (MISCELLANEOUS) ×3 IMPLANT
BIOPATCH WHT 1IN DISK W/4.0 H (GAUZE/BANDAGES/DRESSINGS) ×1 IMPLANT
BLADE EXTENDED COATED 6.5IN (ELECTRODE) IMPLANT
CANISTER WOUNDNEG PRESSURE 500 (CANNISTER) ×1 IMPLANT
CATH URETL OPEN END 6FR 70 (CATHETERS) ×4 IMPLANT
CHLORAPREP W/TINT 26 (MISCELLANEOUS) ×3 IMPLANT
CLOTH BEACON ORANGE TIMEOUT ST (SAFETY) ×3 IMPLANT
COVER MAYO STAND STRL (DRAPES) ×9 IMPLANT
COVER SURGICAL LIGHT HANDLE (MISCELLANEOUS) ×3 IMPLANT
DRAIN CHANNEL 19F RND (DRAIN) ×1 IMPLANT
DRAPE LAPAROSCOPIC ABDOMINAL (DRAPES) ×3 IMPLANT
DRSG OPSITE POSTOP 4X10 (GAUZE/BANDAGES/DRESSINGS) IMPLANT
DRSG OPSITE POSTOP 4X6 (GAUZE/BANDAGES/DRESSINGS) IMPLANT
DRSG OPSITE POSTOP 4X8 (GAUZE/BANDAGES/DRESSINGS) IMPLANT
DRSG VAC ATS LRG SENSATRAC (GAUZE/BANDAGES/DRESSINGS) ×1 IMPLANT
DURAPREP 26ML APPLICATOR (WOUND CARE) ×1 IMPLANT
ELECT REM PT RETURN 15FT ADLT (MISCELLANEOUS) ×3 IMPLANT
EVACUATOR SILICONE 100CC (DRAIN) ×1 IMPLANT
GAUZE SPONGE 4X4 12PLY STRL (GAUZE/BANDAGES/DRESSINGS) IMPLANT
GLOVE BIO SURGEON STRL SZ7 (GLOVE) ×6 IMPLANT
GLOVE BIO SURGEON STRL SZ7.5 (GLOVE) ×3 IMPLANT
GLOVE BIOGEL PI IND STRL 7.5 (GLOVE) ×4 IMPLANT
GLOVE BIOGEL PI INDICATOR 7.5 (GLOVE) ×2
GOWN STRL REUS W/ TWL LRG LVL3 (GOWN DISPOSABLE) ×4 IMPLANT
GOWN STRL REUS W/ TWL XL LVL3 (GOWN DISPOSABLE) ×2 IMPLANT
GOWN STRL REUS W/TWL LRG LVL3 (GOWN DISPOSABLE) ×6
GOWN STRL REUS W/TWL XL LVL3 (GOWN DISPOSABLE) ×3
GUIDEWIRE STR DUAL SENSOR (WIRE) ×3 IMPLANT
GUIDEWIRE ZIPWRE .038 STRAIGHT (WIRE) ×2 IMPLANT
HANDLE SUCTION POOLE (INSTRUMENTS) IMPLANT
KIT TURNOVER KIT A (KITS) IMPLANT
MANIFOLD NEPTUNE II (INSTRUMENTS) ×3 IMPLANT
PACK COLON (CUSTOM PROCEDURE TRAY) ×3 IMPLANT
PACK CYSTO (CUSTOM PROCEDURE TRAY) ×3 IMPLANT
PENCIL SMOKE EVACUATOR (MISCELLANEOUS) ×1 IMPLANT
STAPLER VISISTAT 35W (STAPLE) ×3 IMPLANT
SUCTION POOLE HANDLE (INSTRUMENTS) ×3
SUT ETHILON 2 0 PS N (SUTURE) ×2 IMPLANT
SUT NOVA NAB GS-21 0 18 T12 DT (SUTURE) IMPLANT
SUT NOVA NAB GS-21 1 T12 (SUTURE) IMPLANT
SUT PDS AB 1 TP1 96 (SUTURE) ×2 IMPLANT
SUT SILK 2 0 (SUTURE) ×3
SUT SILK 2 0 SH CR/8 (SUTURE) ×3 IMPLANT
SUT SILK 2-0 18XBRD TIE 12 (SUTURE) ×2 IMPLANT
SUT SILK 3 0 (SUTURE) ×3
SUT SILK 3 0 SH CR/8 (SUTURE) ×3 IMPLANT
SUT SILK 3-0 18XBRD TIE 12 (SUTURE) ×2 IMPLANT
TOWEL OR 17X26 10 PK STRL BLUE (TOWEL DISPOSABLE) ×1 IMPLANT
TOWEL OR NON WOVEN STRL DISP B (DISPOSABLE) ×3 IMPLANT
TRAY FOLEY MTR SLVR 14FR STAT (SET/KITS/TRAYS/PACK) IMPLANT
TRAY FOLEY MTR SLVR 16FR STAT (SET/KITS/TRAYS/PACK) ×1 IMPLANT
TUBING CONNECTING 10 (TUBING) ×3 IMPLANT
TUBING UROLOGY SET (TUBING) ×1 IMPLANT

## 2021-11-23 NOTE — Anesthesia Postprocedure Evaluation (Signed)
Anesthesia Post Note  Patient: MONTREZ MARIETTA  Procedure(s) Performed: EXPLORATORY LAPAROTOMY, PARTIAL COLECTOMY AND COLOSTOMY CYSTOSCOPY WITH STENT PLACEMENT (Bilateral)     Patient location during evaluation: PACU Anesthesia Type: General Level of consciousness: awake and alert Pain management: pain level controlled Vital Signs Assessment: post-procedure vital signs reviewed and stable Respiratory status: spontaneous breathing, nonlabored ventilation, respiratory function stable and patient connected to nasal cannula oxygen Cardiovascular status: blood pressure returned to baseline and stable Postop Assessment: no apparent nausea or vomiting Anesthetic complications: yes   Encounter Notable Events  Notable Event Outcome Phase Comment  Difficult to intubate - expected  Intraprocedure Filed from anesthesia note documentation.    Last Vitals:  Vitals:   11/23/21 1415 11/23/21 1445  BP: 130/80   Pulse: 72 76  Resp: 13 13  Temp: 36.5 C   SpO2: 96% 98%    Last Pain:  Vitals:   11/23/21 1445  TempSrc:   PainSc: 0-No pain                 Dena Esperanza,W. EDMOND

## 2021-11-23 NOTE — Anesthesia Preprocedure Evaluation (Addendum)
Anesthesia Evaluation  Patient identified by MRN, date of birth, ID band Patient awake    Reviewed: Allergy & Precautions, NPO status , Patient's Chart, lab work & pertinent test results  Airway Mallampati: III  TM Distance: >3 FB Neck ROM: Full    Dental no notable dental hx.    Pulmonary sleep apnea and Continuous Positive Airway Pressure Ventilation , Current Smoker,    Pulmonary exam normal breath sounds clear to auscultation       Cardiovascular hypertension, +CHF  Normal cardiovascular exam+ dysrhythmias  Rhythm:Regular Rate:Normal  11/09/21 Echo 1. Left ventricular ejection fraction, by estimation, is 60 to 65%. The  left ventricle has normal function. The left ventricle has no regional  wall motion abnormalities. There is mild concentric left ventricular  hypertrophy. Left ventricular diastolic  parameters were normal.  2. Right ventricular systolic function is normal. The right ventricular  size is mildly enlarged. Tricuspid regurgitation signal is inadequate for  assessing PA pressure.  3. The mitral valve is grossly normal. Trivial mitral valve  regurgitation. No evidence of mitral stenosis.  4. The aortic valve is tricuspid. There is mild calcification of the  aortic valve. Aortic valve regurgitation is not visualized. Aortic valve  sclerosis/calcification is present, without any evidence of aortic  stenosis.  5. There is mild dilatation of the ascending aorta, measuring 42 mm.  6. The inferior vena cava is dilated in size with >50% respiratory  variability, suggesting right atrial pressure of 8 mmHg   Neuro/Psych    GI/Hepatic   Endo/Other  diabetes, Type 2Morbid obesity (BMI 42)  Renal/GU ARF and CRFRenal diseaseLab Results      Component                Value               Date                      CREATININE               1.29 (H)            11/23/2021               K                        3.6                  11/23/2021                    Musculoskeletal  (+) Arthritis ,   Abdominal   Peds  Hematology  (+) Blood dyscrasia, anemia , Lab Results      Component                Value               Date                      WBC                      9.2                 11/23/2021                HGB                      10.5 (L)  11/23/2021                HCT                      32.3 (L)            11/23/2021                MCV                      98.2                11/23/2021                PLT                      286                 11/23/2021              Anesthesia Other Findings   Reproductive/Obstetrics                            Anesthesia Physical Anesthesia Plan  ASA: 3  Anesthesia Plan: General   Post-op Pain Management: Ketamine IV* and Ofirmev IV (intra-op)*   Induction: Intravenous  PONV Risk Score and Plan: Treatment may vary due to age or medical condition, Midazolam and Ondansetron  Airway Management Planned: Oral ETT  Additional Equipment: None  Intra-op Plan:   Post-operative Plan: Extubation in OR  Informed Consent: I have reviewed the patients History and Physical, chart, labs and discussed the procedure including the risks, benefits and alternatives for the proposed anesthesia with the patient or authorized representative who has indicated his/her understanding and acceptance.     Dental advisory given  Plan Discussed with: CRNA  Anesthesia Plan Comments:        Anesthesia Quick Evaluation

## 2021-11-23 NOTE — Progress Notes (Signed)
Found patients right JP drain to to be oozing blood from saturated dressing. Gown, bed pad and bedding soiled with moderate amount of blood. Patient asymptomatic and has no complaints at this time. Reinforced dressing notified provider, pharmacist and paged IR on call. IR will assess in AM. No new orders at this time.

## 2021-11-23 NOTE — Anesthesia Procedure Notes (Signed)
Procedure Name: Intubation Date/Time: 11/23/2021 11:33 AM  Performed by: Lind Covert, CRNAPre-anesthesia Checklist: Patient identified, Emergency Drugs available, Suction available, Patient being monitored and Timeout performed Patient Re-evaluated:Patient Re-evaluated prior to induction Oxygen Delivery Method: Circle system utilized Preoxygenation: Pre-oxygenation with 100% oxygen Induction Type: IV induction, Rapid sequence and Cricoid Pressure applied Laryngoscope Size: Mac, 4 and Glidescope Grade View: Grade I Tube size: 7.5 mm Number of attempts: 2 (1st attempt with MAC 4 blade, Patient with large overbite only able to see base of cords, chose to go straight to MAC 4 glidescope without difficulty) Airway Equipment and Method: Stylet Placement Confirmation: ETT inserted through vocal cords under direct vision, positive ETCO2 and breath sounds checked- equal and bilateral Secured at: 23 cm Tube secured with: Tape Dental Injury: Teeth and Oropharynx as per pre-operative assessment  Difficulty Due To: Difficult Airway- due to dentition and Difficulty was anticipated Future Recommendations: Recommend- induction with short-acting agent, and alternative techniques readily available

## 2021-11-23 NOTE — Op Note (Signed)
Preoperative diagnosis: pelvic abscess, likely complicated diverticulitis not responding to medical therapy and drains Postoperative diagnosis: saa, pelvis and good portion of abdomen frozen Procedure: Exploratory laparotomy Drainage of pelvic abscess Surgeon Dr Serita Grammes Asst Saverio Danker PA-C EBL 500 cc Specimens none Drains 19 Fr Blake drain to pelvis Complications none Sponge and needle count correct  Dispo recovery stable.  Indications:  This is a 54 yom admitted 6/5 with septic shock secondary to multiple IAA.  These are presumable diverticular in nature. He underwent perc drainage with IR and improved. Eventually these drains were removed and then had them replaced when he didn't get better. He continued to not really progress after this second drainage. I saw him yesterday and we obtained another ct scan.  This shows some improvement and a pelvic collection not drained. Still presumably diverticulitis.  He clinically has not improved at all and was tender. I discussed that with this long course I think that we need to proceed with surgery and perform sigmoid colectomy, colostomy with drainage of abscess. We discussed the high risk nature of this.  Procedure: After informed consent was obtained he was taken to the operating room.  He was already on antibiotics.  SCDs were in place.  He was placed under general anesthesia without complication.  He first had ureteral stents placed by urology.  He was then prepped and draped in the standard sterile surgical fashion.  A surgical timeout was then performed.  I made a midline incision.  Upon entering into his abdomen his omentum was fused to his abdominal wall.  I took this down with blunt dissection.  He had a lot of hemorrhage from his omental adhesions as well as everything that I dissected.  This accounted for the blood loss.  Eventually I was able to identify the small bowel.  His small bowel for the most part in his mid abdomen and  pelvis was fused together.  This was really unable to be separated easily.  I attempted to go to his left gutter.  The small bowel in this location was fused to his abdominal wall.  I thought that further dissection of this would likely lead to holes in the small bowel requiring resection in an area that was already basically frozen.  I thought this would put him a very high risk of having enterocutaneous fistulas.  I really was unable to get over to his sigmoid colon safely due to the chronicity of this disease.  I was able to enter into his pelvis with blunt dissection.  I drained a fair amount of purulence very deep in his pelvis that corresponded to the CT scan finding.  I then was able to identify his right colon.  In entering his right upper quadrant of which there was not really anything abnormal.  I realize at this point that I was not going to be able to resect the sigmoid colon safely.  I think this would have just led to significant bout of blood loss as well as what would likely be multiple fistulas afterwards especially in his current nutritional state.  I considered doing a diversion.  There really is not a good place on his colon to divert him as it was difficult to bring up to his abdominal wall.  I then looked to his small bowel.  I was able to identify what I think is his terminal ileum.  But this was fused for a good portion.  There was no stool that was leaking  from his pelvic abscess so in the end I elected to just drain this pelvic abscess after irrigating it with a 19 [place] Blake drain.  I elected not to proceed with any more of the planned surgery as I think this would have just lead to significant complications.  Hopefully he will get better with the drain and prolonged antibiotics.  He was not bleeding when we closed.  I then closed him with #1 looped PDS.  I left his wound open due to the purulence during the case.  I then placed a VAC over his wound.  He tolerated this fine and was  transferred to the recovery room in stable condition.

## 2021-11-23 NOTE — Progress Notes (Signed)
During routine line care, noticed that the white lumen on pt. Left IJ is occluded. Unable to flush or draw back. The other 2 lumens are working appropriately. Recommend evaluating if it would be appropriate to d/c IJ and inserting a PICC if needed, depending on length of expected treatment. Notified RN.

## 2021-11-23 NOTE — Consult Note (Addendum)
[place] Nurse ostomy consult note  [place] Nurse requested for preoperative stoma site marking by Dr. Donne Hazel.  Discussed surgical procedure and stoma creation with patient.  Explained role of the [place] nurse team.  Examined patient lying and sitting in order to place the marking in the patient's visual field, away from any creases or abdominal contour issues and within the rectus muscle.  Patient with large firm abdomen with no evidence of previous surgeries. Patient is marked high in the upper right and left quadrants to facilitate self care; it is understood that reaching either of these preferred locations may not be possible. If lower sites are selected, there are no creases or irregularities in the topography to avoid. A budded stoma is the desired priority over location for self management.  Marked for colostomy in the LUQ  9cm to the left of the umbilicus and 10GY above the umbilicus.  Marked for ileostomy in the RUQ  8cm to the right of the umbilicus and  69.4WN above the umbilicus.  Patient's abdomen cleansed with CHG wipes at site markings, allowed to air dry prior to marking.Covered marks with thin film transparent dressing to preserve marks until date of surgery, which is scheduled for later today.   [place] Nurse team will follow up with patient after surgery for continue ostomy care and teaching. Should a stoma be created intraoperatively.  Thank you for inviting [place] to participate in this patient's Plan of Care.  Maudie Flakes, MSN, RN, CNS, [place], Serita Grammes, [employer], [employer] phone:  [phone]

## 2021-11-23 NOTE — Progress Notes (Signed)
PT Cancellation Note  Patient Details Name: TYREEK CLABO MRN: [id number] DOB: [birth year]   Cancelled Treatment:    Reason Eval/Treat Not Completed: Patient at procedure or test/unavailable (pt at surgery. Will follow.)  Philomena Doheny PT 11/23/2021  Acute Rehabilitation Services Pager [phone] Office [phone]

## 2021-11-23 NOTE — Progress Notes (Signed)
Day of Surgery   Subjective/Chief Complaint: unchanged   Objective: Vital signs in last 24 hours: Temp:  [98.2 F (36.8 C)-98.6 F (37 C)] 98.2 F (36.8 C) (06/20 0618) Pulse Rate:  [61-66] 61 (06/20 0618) Resp:  [17-20] 18 (06/20 0618) BP: (132-156)/(71-74) 132/74 (06/20 0618) SpO2:  [92 %-97 %] 92 % (06/20 0618) Last BM Date : 11/21/21  Intake/Output from previous day: 06/19 0701 - 06/20 0700 In: 2439.2 [P.O.:1200; I.V.:804.4; IV Piggyback:404.9] Out: 3290 [Urine:3200; Drains:90] Intake/Output this shift: No intake/output data recorded.  GI: soft tender llq and rlq drains functional  Lab Results:  Recent Labs    11/22/21 0435 11/23/21 0400  WBC 9.1 9.2  HGB 11.4* 10.5*  HCT 34.6* 32.3*  PLT 291 286   BMET Recent Labs    11/22/21 0435 11/23/21 0400  NA 136 135  K 3.7 3.6  CL 105 103  CO2 24 25  GLUCOSE 113* 111*  BUN 12 13  CREATININE 1.39* 1.29*  CALCIUM 8.3* 8.3*   PT/INR No results for input(s): "LABPROT", "INR" in the last 72 hours. ABG No results for input(s): "PHART", "HCO3" in the last 72 hours.  Invalid input(s): "PCO2", "PO2"  Studies/Results: CT ABDOMEN PELVIS W CONTRAST  Result Date: 11/22/2021 CLINICAL DATA:  Abdominal pain. EXAM: CT ABDOMEN AND PELVIS WITH CONTRAST TECHNIQUE: Multidetector CT imaging of the abdomen and pelvis was performed using the standard protocol following bolus administration of intravenous contrast. RADIATION DOSE REDUCTION: This exam was performed according to the departmental dose-optimization program which includes automated exposure control, adjustment of the mA and/or kV according to patient size and/or use of iterative reconstruction technique. CONTRAST:  174m OMNIPAQUE IOHEXOL 300 MG/ML  SOLN COMPARISON:  11/18/2021 and earlier FINDINGS: Lower chest: There are bilateral pleural effusions. Bibasilar atelectasis and LEFT LOWER lobe consolidation are similar to prior study. Heart size is normal. Hepatobiliary:  Cirrhotic contour of the liver. A 3.1 centimeter low-attenuation lesion is identified in the posterior segment of the RIGHT hepatic lobe, not seen on prior noncontrast exams. Further evaluation is indicated. Gallbladder is distended.  No radiopaque calculi. Pancreas: Unremarkable. No pancreatic ductal dilatation or surrounding inflammatory changes. Spleen: Normal in size without focal abnormality. Adrenals/Urinary Tract: A 1.1 centimeter nodule in the RIGHT adrenal gland is stable. A 2.5 centimeter nodule in the LEFT adrenal gland is stable. Numerous bilateral renal cysts are present. Largest is in the LEFT kidney, 5.9 centimeters. No hydronephrosis. Ureters are unremarkable. Urinary bladder is distended and otherwise normal. Stomach/Bowel: Stomach and small bowel loops are normal in appearance. Contrast is seen in the colon to the level of the anus. There are numerous colonic diverticula. There is focal wall thickening in the region of the redundant sigmoid colon. There is moderate mesenteric edema. Small amount of mesenteric fluid identified adjacent to the hepatic segment of the colon and in the RIGHT UPPER QUADRANT, without evidence for abscess. The largest fluid collection is 7.3 x 2.9 x 2.0 centimeters. RIGHT LOWER QUADRANT a drain is in place. No significant fluid collections around the drain. There has been almost complete resolution of air-fluid collection between the bladder in the rectum (image 69 of series 2 and 74 of series 6). This collection now measures 6.2 by 2.2 centimeters. Previously, this measured 9.4 x 6.6 centimeters. Stable appearance of small air-fluid collection in the LEFT central pelvis measuring 6.4 x 3.5 centimeters (image 57 of series 2). Significantly smaller oval collection of fluid contains pigtail drain in the LEFT mid abdomen, now measuring 5.5  x 1.7 centimeters and previously 6.3 centimeters. (Image 46 of series 2). Vascular/Lymphatic: There is atherosclerotic calcification of  the abdominal aorta. Small para aortic lymph nodes are likely reactive. Reproductive: Prostate is unremarkable. Other: There is edema of the LATERAL abdominal wall bilaterally, LEFT greater than RIGHT. Musculoskeletal: Degenerative changes in the LOWER lumbar spine. Prior LEFT hip arthroplasty. IMPRESSION: 1. Improving multiple fluid collections within the abdomen and pelvis. 2. No significant fluid around the RIGHT LOWER lobe drain. 3. Small fluid around the LEFT LOWER lobe drain, with significant improvement in this collection. 4. Near complete resolution of air fluid collection between the rectum and the bladder. 5. Stable small collection in the central pelvis. 6. Diverticulosis. 7. Indeterminate low-attenuation lesion in the RIGHT hepatic lobe measuring 3.1 centimeters. This has not been fully characterized. In the setting of cirrhosis, further characterization is needed. Recommend non emergent outpatient MRI of the liver. 8.  Aortic atherosclerosis.  (ICD10-I70.0) 9. Abdominal wall edema. 10. Bilateral pleural effusions, bibasilar atelectasis, and LEFT LOWER lobe consolidation are similar. 11. Stable bilateral adrenal nodules. Electronically Signed   By: Nolon Nations M.D.   On: 11/22/2021 12:02    Anti-infectives: Anti-infectives (From admission, onward)    Start     Dose/Rate Route Frequency Ordered Stop   11/23/21 0830  piperacillin-tazobactam (ZOSYN) IVPB 3.375 g        3.375 g 12.5 mL/hr over 240 Minutes Intravenous Every 8 hours 11/23/21 0736     11/22/21 1200  Ampicillin-Sulbactam (UNASYN) 3 g in sodium chloride 0.9 % 100 mL IVPB  Status:  Discontinued        3 g 200 mL/hr over 30 Minutes Intravenous Every 6 hours 11/22/21 0923 11/23/21 0736   11/20/21 1645  micafungin (MYCAMINE) 100 mg in sodium chloride 0.9 % 100 mL IVPB        100 mg 105 mL/hr over 1 Hours Intravenous Every 24 hours 11/20/21 1554     11/08/21 0730  vancomycin (VANCOREADY) IVPB 1750 mg/350 mL        1,750 mg 175  mL/hr over 120 Minutes Intravenous  Once 11/08/21 0641 11/08/21 0908   11/08/21 0641  vancomycin variable dose per unstable renal function (pharmacist dosing)  Status:  Discontinued         Does not apply See admin instructions 11/08/21 0641 11/08/21 1138   11/07/21 2000  piperacillin-tazobactam (ZOSYN) IVPB 3.375 g  Status:  Discontinued        3.375 g 12.5 mL/hr over 240 Minutes Intravenous Every 8 hours 11/07/21 1921 11/22/21 0923   11/07/21 1430  piperacillin-tazobactam (ZOSYN) IVPB 3.375 g        3.375 g 100 mL/hr over 30 Minutes Intravenous  Once 11/07/21 1418 11/07/21 1535       Assessment/Plan: Multiple intraabdominal abscesses likely secondary to diverticular disease - s/p IR drain placement 6/5 - drainage purulent, Cx with pan-sensitive E.coli  - repeat CT AP 6/9 - with resolution of large abscess but some persistent interloop collections  - IR removed TG drain 6/13 - WBC 9 - repeat CT 6/14 with recurrent pelvic abscess, larger collection in the left paracolic gutter - s/p placement of LLQ and RLQ drains yesterday - RLQ drain with some concern for some feculent looking material in tubing, bulb more bloody-purulent, LLQ drain looks serous - repeat CT a little better but with undrained fluid and he is no better clinically. He needs surgery for this. I discussed this yesterday and this am again. Discussed sigmoid colectomy and  colostomy with ureteral stents.  Prealbumin is 8.5 so this is higher risk at this point and no anastomosis would be ideal. Discussed risks and recovery with he an his family will proceed today -heparin has been off, npo   FEN: npoIVF per [facility] VTE: SCDs, hep gtt ID: Zosyn 6/4>>   - per CCM -  A. 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

## 2021-11-23 NOTE — Plan of Care (Signed)
  Problem: Clinical Measurements: Goal: Diagnostic test results will improve Outcome: Progressing Goal: Respiratory complications will improve Outcome: Progressing   

## 2021-11-23 NOTE — Transfer of Care (Signed)
Immediate Anesthesia Transfer of Care Note  Patient: Robert Larson  Procedure(s) Performed: EXPLORATORY LAPAROTOMY, PARTIAL COLECTOMY AND COLOSTOMY CYSTOSCOPY WITH STENT PLACEMENT (Bilateral)  Patient Location: PACU  Anesthesia Type:General  Level of Consciousness: sedated  Airway & Oxygen Therapy: Patient Spontanous Breathing and Patient connected to face mask oxygen  Post-op Assessment: Report given to RN and Post -op Vital signs reviewed and stable  Post vital signs: Reviewed and stable  Last Vitals:  Vitals Value Taken Time  BP 130/80 11/23/21 1415  Temp    Pulse 72 11/23/21 1415  Resp    SpO2 99 % 11/23/21 1415  Vitals shown include unvalidated device data.  Last Pain:  Vitals:   11/23/21 1041  TempSrc:   PainSc: 9       Patients Stated Pain Goal: 4 (44/92/01 0071)  Complications:  Encounter Notable Events  Notable Event Outcome Phase Comment  Difficult to intubate - expected  Intraprocedure Filed from anesthesia note documentation.

## 2021-11-23 NOTE — Progress Notes (Signed)
ANTICOAGULATION CONSULT NOTE - Follow Up Consult  Pharmacy Consult for Heparin Indication: atrial fibrillation  No Known Allergies  Patient Measurements: Height: 5' 6.93" (170 cm) Weight: 121.5 kg (267 lb 13.7 oz) IBW/kg (Calculated) : 65.94 Heparin Dosing Weight:  94 kg  Vital Signs: BP: 137/73 (06/19 2025) Pulse Rate: 65 (06/19 2025)  Labs: Recent Labs    11/21/21 0527 11/22/21 0435 11/23/21 0400  HGB 11.4* 11.4* 10.5*  HCT 35.1* 34.6* 32.3*  PLT 285 291 286  HEPARINUNFRC 0.64 0.69 0.71*  CREATININE 1.35* 1.39* 1.29*     Estimated Creatinine Clearance: 72.1 mL/min (A) (by C-G formula based on SCr of 1.29 mg/dL (H)).   Assessment: 66 yo male presented with severe sepsis 2/2 intra-abdominal abscess.  Patient now with new onset atrial fibrillation w/ RVR.  Pharmacy consulted to dose heparin IV for Afib.  Today, 11/23/21: - HL 0.71 (slightly supratherapeutic) with heparin gtt @ 2750 units/hr - Hgb low (10.5), plts WNL - RN reports blood ozzing from JP drain and provider made aware  Goal of Therapy:  Heparin level 0.3-0.7 units/ml Monitor platelets by anticoagulation protocol: Yes   Plan:  Reduce heparin drip to 2650 units/hr Check heparin level in 6 hr Daily HL and CBC GI problems limiting transition to [place], PharmD 11/23/2021, 5:01 AM

## 2021-11-23 NOTE — Op Note (Signed)
Operative Note  Preoperative diagnosis:  1.  diverticulitis  Post operative diagnosis: 1.  diverticulitis  Procedure(s): 1.  Cystoscopy with bilateral retrograde pyelogram and bilateral open-ended ureteral catheter placement  Surgeon: Link Snuffer, MD  Assistants: None  Anesthesia: General  Complications: None immediate  EBL: Minimal  Specimens: 1.  None  Drains/Catheters: 1.  Bilateral open-ended ureteral catheters 2.  Foley catheter  Intraoperative findings: 1.  Normal urethra and bladder 2.  left retrograde pyelogram revealed normal ureter and kidney without any filling defects 3. Right retrograde pyelogram revealed a tortuous mid ureter with a 360 degree turn.  Difficult to access with a wire but was finally able to access to place the temporary ureteral stent.  Indication: 66 year old Male with a history of diverticulitis presents for colectomy.  Intraoperative stent placement was requested.  Description of procedure:  The patient was identified and consent was obtained.  The patient was taken to the operating room and placed in the supine position.  The patient was placed under general anesthesia.  Perioperative antibiotics were administered.  The patient was placed in dorsal lithotomy.  Patient was prepped and draped in a standard sterile fashion and a timeout was performed.  A 21 French rigid cystoscope was advanced into the urethra and into the bladder.  The left distal most portion of the ureter was cannulated with an open-ended ureteral catheter.  Retrograde pyelogram was performed with the findings noted above.  A sensor wire was then advanced up to the kidney under fluoroscopic guidance.  The open-ended ureteral catheter was advanced over the wire up to the kidney and then the scope and wire were withdrawn keeping the open-ended ureteral catheter in place.   The right distal most portion of the ureter was cannulated with an open-ended ureteral catheter.  Retrograde  pyelogram was performed with the findings noted above.  A sensor wire was then advanced up to the kidney under fluoroscopic guidance.  The sensor wire was not able to be advanced up the ureter.  I therefore exchanged for a Glidewire.  After multiple attempts, I was finally able to pass the Glidewire into the kidney and the ureter straightened out and I was able to advance the open-ended ureteral stent up to the kidney. Foley catheter was placed and both open-ended ureteral catheters were secured and threaded through into a drainage bag.  This concluded my portion of the operation.  Patient tolerated procedure well and the case was handed over to general surgery.  Plan: Per general surgery.  Urology will be available as needed.

## 2021-11-23 NOTE — Progress Notes (Signed)
PROGRESS NOTE    Robert Larson  OHY:[id number] DOB: 1956/04/01 DOA: 11/07/2021 PCP: Jeanie Sewer, NP  Brief Narrative: 66 year old with history of DM2, HTN, CKD admitted to the hospital for severe sepsis due to intra-abdominal abscess.    CT abdomen pelvis on admit -showed multiple loculated fluid collection in the lower abdomen with a large cavity in the pelvis measuring 11.5 X6X 11.7 cm.  Due to septic shock he was transferred to the ICU on 6/5.  Patient was seen by IR and drain was placed.   Hospital course was also complicated by acute kidney injury, hypoxia and atrial fibrillation with RVR.   Repeat CT 6/9 -showed resolution of abscess  The drain was removed by IR on 11/16/2021 CT abdomen 11/17/2021-recurrent pelvic abscess. IR placed left lower quadrant and right lower quadrant drains on 11/18/2021  Patient is also on Cardizem drip, slowly uptitrating beta-blocker and heparin drip.  Patient started on Lasix 40 mg daily. 11/21/2021 patient has Candida tropicalis growing in the abscess drainage.  ID consulted patient started on micafungin.  Patient has swelling in bilateral lower extremity and some erythema noted.  He was on a diuretic and an ARB at home. CT abdomen 11/22/2021 with multiple fluid collections Assessment & Plan:   Principal Problem:   Intra-abdominal abscesses s/p perc drainage 11/08/2021 Active Problems:   Essential hypertension   Type 2 diabetes mellitus with morbid obesity (HCC)   Diverticulosis   Hyponatremia   Acute renal failure superimposed on chronic kidney disease (HCC)   Sleep apnea   Osteoarthritis   CKD (chronic kidney disease) stage 3, GFR 30-59 ml/min (HCC)   Adrenal nodule (HCC)   History of adenomatous polyp of colon   Spinal stenosis of lumbar region   Renal cyst   Cholelithiasis   Coffee ground emesis   Septic shock (HCC)   Tobacco abuse   Abscess   #1 multiple intra-abdominal abscess likely from diverticular micro perforation Septic  shock secondary to above Patient had IR drain placed which was removed on 11/16/2021. Repeat CT scan 11/17/2021 shows recurrent large abscess 6 x 9 cm. On Zosyn. Patient had IR drain placed  11/18/2021 Follow-up cultures from the drain shows Candida tropicalis.  ID consulted.  Micafungin started. CT from 11/22/2021 with persistent multiple intra-abdominal abscess patient was taken to the OR on 11/23/2021 had exploratory laparotomy and drainage of pelvic abscess.  Op note reviewed.  #2 new onset A-fib with RVR normal TSH normal echo on metoprolol and heparin drip.  Decrease the dose of metoprolol due to bradycardia We will start p.o. anticoagulation once all surgical procedures are over.  #3 AKI on CKD stage IIIa resolved.  Now on Lasix monitor closely.  #4 transaminitis resolved  #5 essential hypertension continue metoprolol and Norvasc.  #6 obstructive sleep apnea nocturnal CPAP ordered but he refuses most of the nights  #7 acute hypoxic respiratory distress resolved  #8 mild hyponatremia DC half-normal saline patient is able to tolerate some p.o. intake.  Hyponatremia resolved.  #9 hyperglycemia A1c was 6.1 on April 2023.  Decrease fingersticks to twice a day and cover with SSI. CBG (last 3)  Recent Labs    11/22/21 1805 11/23/21 0728 11/23/21 1035  GLUCAP 128* 106* 101*    #10 bilateral lower extremity edema patient was on a mild diuretic at home I will start him on Lasix 40 mg daily this was not restarted due to sepsis and hypotension on admission.  Patient was on HCTZ and an ARB at home  which we will continue to hold.  #11 hypoalbuminemia patient tolerating a soft diet however he was n.p.o. most of the time.  Dietary consult.  He does have some third spacing due to low albumin.   Estimated body mass index is 42.04 kg/m as calculated from the following:   Height as of this encounter: 5' 6.93" (1.7 m).   Weight as of this encounter: 121.5 kg.  DVT prophylaxis: Heparin  code  Status: Full code Family Communication: Discussed with daughter [name] disposition Plan:  Status is: Inpatient Remains inpatient appropriate because: Recurrent intra-abdominal abscess   Consultants:  General surgery  Procedures: IR placement of intra-abdominal drain 6/ 15 /2023 Antimicrobials:unasyn micofungin  Subjective: Patient is anxious to have the procedure done he denies abdominal pain but belly remains distended Objective: Vitals:   11/22/21 2025 11/23/21 0618 11/23/21 1015 11/23/21 1041  BP: 137/73 132/74 136/77   Pulse: 65 61 (!) 57   Resp: '17 18 18   '$ Temp:  98.2 F (36.8 C) 98.2 F (36.8 C)   TempSrc:  Oral Oral   SpO2: 93% 92% 95%   Weight:    121.5 kg  Height:    5' 6.93" (1.7 m)    Intake/Output Summary (Last 24 hours) at 11/23/2021 1416 Last data filed at 11/23/2021 1404 Gross per 24 hour  Intake 3761.84 ml  Output 2220 ml  Net 1541.84 ml    Filed Weights   11/18/21 2310 11/21/21 0000 11/23/21 1041  Weight: 126.2 kg 121.5 kg 121.5 kg    Examination:  General exam: Appears not in any distress.   Respiratory system: Diminished at the bases to auscultation. Respiratory effort normal. Cardiovascular system: S1 & S2 heard, RRR. No JVD, murmurs, rubs, gallops or clicks. No pedal edema. Gastrointestinal system: Abdomen is distended, soft and nontender. No organomegaly or masses felt. Normal bowel sounds heard. 2 drains in place left lower quadrant and right lower quadrant. Central nervous system: Alert and oriented. No focal neurological deficits. Extremities: 3+ pitting edema Skin: No rashes, lesions or ulcers Psychiatry: Judgement and insight appear normal. Mood & affect appropriate.     Data Reviewed: I have personally reviewed following labs and imaging studies  CBC: Recent Labs  Lab 11/19/21 0342 11/20/21 0245 11/21/21 0527 11/22/21 0435 11/23/21 0400  WBC 13.2* 9.8 8.7 9.1 9.2  HGB 11.4* 11.0* 11.4* 11.4* 10.5*  HCT 35.3* 33.7* 35.1*  34.6* 32.3*  MCV 98.3 98.3 97.8 97.5 98.2  PLT 313 286 285 291 814    Basic Metabolic Panel: Recent Labs  Lab 11/18/21 0438 11/18/21 0832 11/19/21 0342 11/20/21 0245 11/21/21 0527 11/22/21 0435 11/23/21 0400  NA  --    < > 134* 134* 135 136 135  K  --    < > 3.8 3.5 3.6 3.7 3.6  CL  --    < > 104 106 104 105 103  CO2  --    < > '23 22 22 24 25  '$ GLUCOSE  --    < > 97 96 106* 113* 111*  BUN  --    < > '21 16 13 12 13  '$ CREATININE  --    < > 1.42* 1.39* 1.35* 1.39* 1.29*  CALCIUM  --    < > 8.0* 8.1* 8.2* 8.3* 8.3*  MG 1.6*  --  2.0 1.8 1.7 1.7  --    < > = values in this interval not displayed.    GFR: Estimated Creatinine Clearance: 72.1 mL/min (A) (by C-G formula  based on SCr of 1.29 mg/dL (H)). Liver Function Tests: Recent Labs  Lab 11/17/21 0427 11/23/21 0400  AST 42* 19  ALT 37 15  ALKPHOS 51 37*  BILITOT 1.2 0.8  PROT 6.5 6.8  ALBUMIN 2.1* 1.9*    No results for input(s): "LIPASE", "AMYLASE" in the last 168 hours. No results for input(s): "AMMONIA" in the last 168 hours. Coagulation Profile: No results for input(s): "INR", "PROTIME" in the last 168 hours.  Cardiac Enzymes: No results for input(s): "CKTOTAL", "CKMB", "CKMBINDEX", "TROPONINI" in the last 168 hours. BNP (last 3 results) No results for input(s): "PROBNP" in the last 8760 hours. HbA1C: No results for input(s): "HGBA1C" in the last 72 hours. CBG: Recent Labs  Lab 11/22/21 0749 11/22/21 1315 11/22/21 1805 11/23/21 0728 11/23/21 1035  GLUCAP 113* 109* 128* 106* 101*    Lipid Profile: No results for input(s): "CHOL", "HDL", "LDLCALC", "TRIG", "CHOLHDL", "LDLDIRECT" in the last 72 hours. Thyroid Function Tests: No results for input(s): "TSH", "T4TOTAL", "FREET4", "T3FREE", "THYROIDAB" in the last 72 hours. Anemia Panel: No results for input(s): "VITAMINB12", "FOLATE", "FERRITIN", "TIBC", "IRON", "RETICCTPCT" in the last 72 hours. Sepsis Labs: No results for input(s): "PROCALCITON",  "LATICACIDVEN" in the last 168 hours.  Recent Results (from the past 240 hour(s))  Aerobic/Anaerobic Culture w Gram Stain (surgical/deep wound)     Status: None (Preliminary result)   Collection Time: 11/18/21  4:08 PM   Specimen: Abscess  Result Value Ref Range Status   Specimen Description ABSCESS ABDOMEN  Final   Special Requests LEFT LATERAL ABDOMEN DRAIN NO 2  Final   Gram Stain   Final    NO SQUAMOUS EPITHELIAL CELLS SEEN ABUNDANT WBC SEEN NO ORGANISMS SEEN    Culture   Final    RARE CANDIDA TROPICALIS NO ANAEROBES ISOLATED Performed at [facility], [place] [address] [place], [place] [address]    Report Status PENDING  Incomplete  Aerobic/Anaerobic Culture w Gram Stain (surgical/deep wound)     Status: None (Preliminary result)   Collection Time: 11/18/21  4:08 PM   Specimen: Abscess  Result Value Ref Range Status   Specimen Description ABSCESS ABDOMEN  Final   Special Requests RT LOWER QUAD DRAIN NO 1  Final   Gram Stain   Final    NO SQUAMOUS EPITHELIAL CELLS SEEN MODERATE WBC SEEN NO ORGANISMS SEEN    Culture   Final    NO GROWTH 4 DAYS NO ANAEROBES ISOLATED; CULTURE IN PROGRESS FOR 5 DAYS Performed at [facility], [place] [address] [place], [place] [address]    Report Status PENDING  Incomplete         Radiology Studies: DG C-Arm 1-60 Min-No Report  Result Date: 11/23/2021 Fluoroscopy was utilized by the requesting physician.  No radiographic interpretation.   CT ABDOMEN PELVIS W CONTRAST  Result Date: 11/22/2021 CLINICAL DATA:  Abdominal pain. EXAM: CT ABDOMEN AND PELVIS WITH CONTRAST TECHNIQUE: Multidetector CT imaging of the abdomen and pelvis was performed using the standard protocol following bolus administration of intravenous contrast. RADIATION DOSE REDUCTION: This exam was performed according to the departmental dose-optimization program which includes automated exposure control, adjustment of the mA and/or kV according to patient size  and/or use of iterative reconstruction technique. CONTRAST:  134m OMNIPAQUE IOHEXOL 300 MG/ML  SOLN COMPARISON:  11/18/2021 and earlier FINDINGS: Lower chest: There are bilateral pleural effusions. Bibasilar atelectasis and LEFT LOWER lobe consolidation are similar to prior study. Heart size is normal. Hepatobiliary: Cirrhotic contour of  the liver. A 3.1 centimeter low-attenuation lesion is identified in the posterior segment of the RIGHT hepatic lobe, not seen on prior noncontrast exams. Further evaluation is indicated. Gallbladder is distended.  No radiopaque calculi. Pancreas: Unremarkable. No pancreatic ductal dilatation or surrounding inflammatory changes. Spleen: Normal in size without focal abnormality. Adrenals/Urinary Tract: A 1.1 centimeter nodule in the RIGHT adrenal gland is stable. A 2.5 centimeter nodule in the LEFT adrenal gland is stable. Numerous bilateral renal cysts are present. Largest is in the LEFT kidney, 5.9 centimeters. No hydronephrosis. Ureters are unremarkable. Urinary bladder is distended and otherwise normal. Stomach/Bowel: Stomach and small bowel loops are normal in appearance. Contrast is seen in the colon to the level of the anus. There are numerous colonic diverticula. There is focal wall thickening in the region of the redundant sigmoid colon. There is moderate mesenteric edema. Small amount of mesenteric fluid identified adjacent to the hepatic segment of the colon and in the RIGHT UPPER QUADRANT, without evidence for abscess. The largest fluid collection is 7.3 x 2.9 x 2.0 centimeters. RIGHT LOWER QUADRANT a drain is in place. No significant fluid collections around the drain. There has been almost complete resolution of air-fluid collection between the bladder in the rectum (image 69 of series 2 and 74 of series 6). This collection now measures 6.2 by 2.2 centimeters. Previously, this measured 9.4 x 6.6 centimeters. Stable appearance of small air-fluid collection in the LEFT  central pelvis measuring 6.4 x 3.5 centimeters (image 57 of series 2). Significantly smaller oval collection of fluid contains pigtail drain in the LEFT mid abdomen, now measuring 5.5 x 1.7 centimeters and previously 6.3 centimeters. (Image 46 of series 2). Vascular/Lymphatic: There is atherosclerotic calcification of the abdominal aorta. Small para aortic lymph nodes are likely reactive. Reproductive: Prostate is unremarkable. Other: There is edema of the LATERAL abdominal wall bilaterally, LEFT greater than RIGHT. Musculoskeletal: Degenerative changes in the LOWER lumbar spine. Prior LEFT hip arthroplasty. IMPRESSION: 1. Improving multiple fluid collections within the abdomen and pelvis. 2. No significant fluid around the RIGHT LOWER lobe drain. 3. Small fluid around the LEFT LOWER lobe drain, with significant improvement in this collection. 4. Near complete resolution of air fluid collection between the rectum and the bladder. 5. Stable small collection in the central pelvis. 6. Diverticulosis. 7. Indeterminate low-attenuation lesion in the RIGHT hepatic lobe measuring 3.1 centimeters. This has not been fully characterized. In the setting of cirrhosis, further characterization is needed. Recommend non emergent outpatient MRI of the liver. 8.  Aortic atherosclerosis.  (ICD10-I70.0) 9. Abdominal wall edema. 10. Bilateral pleural effusions, bibasilar atelectasis, and LEFT LOWER lobe consolidation are similar. 11. Stable bilateral adrenal nodules. Electronically Signed   By: Nolon Nations M.D.   On: 11/22/2021 12:02        Scheduled Meds:  [MAR Hold] amLODipine  5 mg Oral Daily   chlorhexidine  15 mL Mouth/Throat Once   Or   mouth rinse  15 mL Mouth Rinse Once   [MAR Hold] Chlorhexidine Gluconate Cloth  6 each Topical Daily   [MAR Hold] feeding supplement  237 mL Oral BID BM   [MAR Hold] furosemide  40 mg Intravenous Daily   [MAR Hold] insulin aspart  0-15 Units Subcutaneous TID WC   [MAR Hold] lip  balm  1 application  Topical BID   [MAR Hold] mouth rinse  15 mL Mouth Rinse q12n4p   [MAR Hold] metoprolol succinate  50 mg Oral Daily   [MAR Hold] pantoprazole  40  mg Oral BID   [MAR Hold] sodium chloride flush  10-40 mL Intracatheter Q12H   [MAR Hold] sodium chloride flush  5 mL Intracatheter Q8H   Continuous Infusions:  sodium chloride Stopped (11/23/21 1246)   [MAR Hold] sodium chloride Stopped (11/09/21 1144)   [MAR Hold] sodium chloride 10 mL/hr at 11/19/21 0600   lactated ringers 10 mL/hr at 11/23/21 1121   [MAR Hold] micafungin (MYCAMINE) 100 mg in sodium chloride 0.9 % 100 mL IVPB 100 mg (11/22/21 1827)   [MAR Hold] ondansetron (ZOFRAN) IV     [MAR Hold] piperacillin-tazobactam (ZOSYN)  IV 3.375 g (11/23/21 0844)     LOS: 16 days    Time spent: 35 minutes  Georgette Shell, MD  11/23/2021, 2:16 PM

## 2021-11-23 NOTE — Consult Note (Signed)
H&P Physician requesting consult: Rolm Bookbinder  Chief Complaint: Diverticulitis  History of Present Illness: 66 year old male with multiple intra-abdominal abscesses likely secondary to diverticular disease.  Plan is for sigmoid colectomy and colostomy.  Bilateral ureteral stent placement requested intraoperatively.  Past Medical History:  Diagnosis Date   Acute right-sided low back pain with bilateral sciatica 03/13/2017   Contusion of left hip and thigh 02/13/2017   Osteoarthritis    Protein in urine    Sleep apnea    Past Surgical History:  Procedure Laterality Date   REPLACEMENT TOTAL HIP W/  RESURFACING IMPLANTS     Left     Home Medications:  Facility-Administered Medications Prior to Admission  Medication Dose Route Frequency Provider Last Rate Last Admin   0.9 %  sodium chloride infusion  500 mL Intravenous Once Irene Shipper, MD       Medications Prior to Admission  Medication Sig Dispense Refill Last Dose   FARXIGA 10 MG TABS tablet Take 10 mg by mouth daily.   11/06/2021   metoprolol succinate (TOPROL-XL) 25 MG 24 hr tablet Take 1 tablet (25 mg total) by mouth at bedtime. START with 1/2 pill for one week, then increase to 1 full pill. (Patient taking differently: Take 25 mg by mouth at bedtime.) 30 tablet 2 11/06/2021 at 2200   dapagliflozin propanediol (FARXIGA) 5 MG TABS tablet Take 1 tablet (5 mg total) by mouth daily. (Patient not taking: Reported on 11/07/2021) 90 tablet 1 Not Taking   Dulaglutide (TRULICITY) 1.5 WU/9.8JX SOPN Inject 1.5 mg into the skin once a week. Start after you run out of the 0.'75mg'$  dose x 2 each week. (Patient not taking: Reported on 11/07/2021) 0.5 mL 2 Not Taking   metFORMIN (GLUCOPHAGE-XR) 500 MG 24 hr tablet TAKE 1 TABLET BY MOUTH EVERY DAY WITH BREAKFAST (Patient not taking: Reported on 11/07/2021) 90 tablet 1 Not Taking   Omega-3 Fatty Acids (FISH OIL PO) Take 2 capsules by mouth daily. (Patient not taking: Reported on 11/07/2021)   Not Taking    telmisartan-hydrochlorothiazide (MICARDIS HCT) 80-25 MG tablet Take 1 tablet by mouth daily. (Patient not taking: Reported on 11/07/2021)   Not Taking   varenicline (CHANTIX) 0.5 MG tablet INCREASE TO 2 TABS IN AM, 1 TAB IN PM FOR 1-2 WEEKS, THEN 2 TABS AM AND 2 TABS PM. (Patient not taking: Reported on 11/07/2021) 90 tablet 1 Not Taking   Allergies: No Known Allergies  Family History  Problem Relation Age of Onset   Breast cancer Mother    Lung cancer Father    Colon cancer Neg Hx    Rectal cancer Neg Hx    Social History:  reports that he has been smoking cigarettes. He has been smoking an average of 1 pack per day. He has never used smokeless tobacco. He reports that he does not drink alcohol and does not use drugs.  ROS: A complete review of systems was performed.  All systems are negative except for pertinent findings as noted. ROS   Physical Exam:  Vital signs in last 24 hours: Temp:  [98.2 F (36.8 C)-98.6 F (37 C)] 98.2 F (36.8 C) (06/20 1015) Pulse Rate:  [57-66] 57 (06/20 1015) Resp:  [17-20] 18 (06/20 1015) BP: (132-156)/(71-77) 136/77 (06/20 1015) SpO2:  [92 %-97 %] 95 % (06/20 1015) Weight:  [121.5 kg] 121.5 kg (06/20 1041) General:  Alert and oriented, No acute distress HEENT: Normocephalic, atraumatic Neck: No JVD or lymphadenopathy Cardiovascular: Regular rate and rhythm Lungs:  Regular rate and effort Abdomen: Soft, nontender, nondistended, no abdominal masses Back: No CVA tenderness Extremities: No edema Neurologic: Grossly intact  Laboratory Data:  Results for orders placed or performed during the hospital encounter of 11/07/21 (from the past 24 hour(s))  Prealbumin     Status: Abnormal   Collection Time: 11/22/21 11:58 AM  Result Value Ref Range   Prealbumin 8.5 (L) 18 - 38 mg/dL  Glucose, capillary     Status: Abnormal   Collection Time: 11/22/21  1:15 PM  Result Value Ref Range   Glucose-Capillary 109 (H) 70 - 99 mg/dL  Glucose, capillary      Status: Abnormal   Collection Time: 11/22/21  6:05 PM  Result Value Ref Range   Glucose-Capillary 128 (H) 70 - 99 mg/dL  Heparin level (unfractionated)     Status: Abnormal   Collection Time: 11/23/21  4:00 AM  Result Value Ref Range   Heparin Unfractionated 0.71 (H) 0.30 - 0.70 IU/mL  CBC     Status: Abnormal   Collection Time: 11/23/21  4:00 AM  Result Value Ref Range   WBC 9.2 4.0 - 10.5 K/uL   RBC 3.29 (L) 4.22 - 5.81 MIL/uL   Hemoglobin 10.5 (L) 13.0 - 17.0 g/dL   HCT 32.3 (L) 39.0 - 52.0 %   MCV 98.2 80.0 - 100.0 fL   MCH 31.9 26.0 - 34.0 pg   MCHC 32.5 30.0 - 36.0 g/dL   RDW 14.8 11.5 - 15.5 %   Platelets 286 150 - 400 K/uL   nRBC 0.0 0.0 - 0.2 %  Comprehensive metabolic panel     Status: Abnormal   Collection Time: 11/23/21  4:00 AM  Result Value Ref Range   Sodium 135 135 - 145 mmol/L   Potassium 3.6 3.5 - 5.1 mmol/L   Chloride 103 98 - 111 mmol/L   CO2 25 22 - 32 mmol/L   Glucose, Bld 111 (H) 70 - 99 mg/dL   BUN 13 8 - 23 mg/dL   Creatinine, Ser 1.29 (H) 0.61 - 1.24 mg/dL   Calcium 8.3 (L) 8.9 - 10.3 mg/dL   Total Protein 6.8 6.5 - 8.1 g/dL   Albumin 1.9 (L) 3.5 - 5.0 g/dL   AST 19 15 - 41 U/L   ALT 15 0 - 44 U/L   Alkaline Phosphatase 37 (L) 38 - 126 U/L   Total Bilirubin 0.8 0.3 - 1.2 mg/dL   GFR, Estimated >60 >60 mL/min   Anion gap 7 5 - 15  Glucose, capillary     Status: Abnormal   Collection Time: 11/23/21  7:28 AM  Result Value Ref Range   Glucose-Capillary 106 (H) 70 - 99 mg/dL  Glucose, capillary     Status: Abnormal   Collection Time: 11/23/21 10:35 AM  Result Value Ref Range   Glucose-Capillary 101 (H) 70 - 99 mg/dL   Comment 1 Notify RN    Comment 2 Document in Chart   Type and screen [place]     Status: None (Preliminary result)   Collection Time: 11/23/21 10:45 AM  Result Value Ref Range   ABO/RH(D) PENDING    Antibody Screen PENDING    Sample Expiration      11/26/2021,2359 Performed at [facility], [address] [place], [place] [address]    Recent Results (from the past 240 hour(s))  Aerobic/Anaerobic Culture w Gram Stain (surgical/deep wound)     Status: None (Preliminary result)   Collection Time:  11/18/21  4:08 PM   Specimen: Abscess  Result Value Ref Range Status   Specimen Description ABSCESS ABDOMEN  Final   Special Requests LEFT LATERAL ABDOMEN DRAIN NO 2  Final   Gram Stain   Final    NO SQUAMOUS EPITHELIAL CELLS SEEN ABUNDANT WBC SEEN NO ORGANISMS SEEN    Culture   Final    RARE CANDIDA TROPICALIS NO ANAEROBES ISOLATED Performed at [facility], [place] [address] [place], [place] [address]    Report Status PENDING  Incomplete  Aerobic/Anaerobic Culture w Gram Stain (surgical/deep wound)     Status: None (Preliminary result)   Collection Time: 11/18/21  4:08 PM   Specimen: Abscess  Result Value Ref Range Status   Specimen Description ABSCESS ABDOMEN  Final   Special Requests RT LOWER QUAD DRAIN NO 1  Final   Gram Stain   Final    NO SQUAMOUS EPITHELIAL CELLS SEEN MODERATE WBC SEEN NO ORGANISMS SEEN    Culture   Final    NO GROWTH 4 DAYS NO ANAEROBES ISOLATED; CULTURE IN PROGRESS FOR 5 DAYS Performed at [facility], [place] [address] [place], [place] [address]    Report Status PENDING  Incomplete   Creatinine: Recent Labs    11/17/21 0427 11/18/21 0832 11/19/21 0342 11/20/21 0245 11/21/21 0527 11/22/21 0435 11/23/21 0400  CREATININE 1.34* 1.40* 1.42* 1.39* 1.35* 1.39* 1.29*    Impression/Assessment:  Diverticulitis  Plan:  Proceed with cystoscopy with bilateral retrograde pyelogram, bilateral temporary ureteral stent placement.  Risk and benefits discussed.  Marton Redwood, III 11/23/2021, 11:17 AM

## 2021-11-24 ENCOUNTER — Encounter (HOSPITAL_COMMUNITY): Payer: Self-pay | Admitting: General Surgery

## 2021-11-24 ENCOUNTER — Inpatient Hospital Stay: Payer: Self-pay

## 2021-11-24 DIAGNOSIS — N179 Acute kidney failure, unspecified: Secondary | ICD-10-CM | POA: Diagnosis not present

## 2021-11-24 DIAGNOSIS — K651 Peritoneal abscess: Secondary | ICD-10-CM | POA: Diagnosis not present

## 2021-11-24 DIAGNOSIS — I10 Essential (primary) hypertension: Secondary | ICD-10-CM | POA: Diagnosis not present

## 2021-11-24 DIAGNOSIS — K631 Perforation of intestine (nontraumatic): Secondary | ICD-10-CM

## 2021-11-24 DIAGNOSIS — K5790 Diverticulosis of intestine, part unspecified, without perforation or abscess without bleeding: Secondary | ICD-10-CM | POA: Diagnosis not present

## 2021-11-24 LAB — COMPREHENSIVE METABOLIC PANEL
ALT: 12 U/L (ref 0–44)
AST: 17 U/L (ref 15–41)
Albumin: 2.2 g/dL — ABNORMAL LOW (ref 3.5–5.0)
Alkaline Phosphatase: 32 U/L — ABNORMAL LOW (ref 38–126)
Anion gap: 7 (ref 5–15)
BUN: 20 mg/dL (ref 8–23)
CO2: 25 mmol/L (ref 22–32)
Calcium: 8.3 mg/dL — ABNORMAL LOW (ref 8.9–10.3)
Chloride: 104 mmol/L (ref 98–111)
Creatinine, Ser: 1.63 mg/dL — ABNORMAL HIGH (ref 0.61–1.24)
GFR, Estimated: 47 mL/min — ABNORMAL LOW (ref >60)
Glucose, Bld: 120 mg/dL — ABNORMAL HIGH (ref 70–99)
Potassium: 4.5 mmol/L (ref 3.5–5.1)
Sodium: 136 mmol/L (ref 135–145)
Total Bilirubin: 1 mg/dL (ref 0.3–1.2)
Total Protein: 6.5 g/dL (ref 6.5–8.1)

## 2021-11-24 LAB — GLUCOSE, CAPILLARY
Glucose-Capillary: 122 mg/dL — ABNORMAL HIGH (ref 70–99)
Glucose-Capillary: 119 mg/dL — ABNORMAL HIGH (ref 70–99)
Glucose-Capillary: 125 mg/dL — ABNORMAL HIGH (ref 70–99)
Glucose-Capillary: 114 mg/dL — ABNORMAL HIGH (ref 70–99)

## 2021-11-24 LAB — CBC
HCT: 28.8 % — ABNORMAL LOW (ref 39.0–52.0)
Hemoglobin: 9.3 g/dL — ABNORMAL LOW (ref 13.0–17.0)
MCH: 32.3 pg (ref 26.0–34.0)
MCHC: 32.3 g/dL (ref 30.0–36.0)
MCV: 100 fL (ref 80.0–100.0)
Platelets: 288 10*3/uL (ref 150–400)
RBC: 2.88 MIL/uL — ABNORMAL LOW (ref 4.22–5.81)
RDW: 14.9 % (ref 11.5–15.5)
WBC: 11.4 10*3/uL — ABNORMAL HIGH (ref 4.0–10.5)
nRBC: 0 % (ref 0.0–0.2)

## 2021-11-24 LAB — HEPARIN LEVEL (UNFRACTIONATED)
Heparin Unfractionated: <0.1 IU/mL — ABNORMAL LOW (ref 0.30–0.70)
Heparin Unfractionated: 0.14 IU/mL — ABNORMAL LOW (ref 0.30–0.70)

## 2021-11-24 MED ORDER — METHOCARBAMOL 1000 MG/10ML IJ SOLN
500.0000 mg | Freq: Four times a day (QID) | INTRAVENOUS | Status: DC
  Administered 2021-11-24 – 2021-11-29 (×17): 500 mg via INTRAVENOUS
  Filled 2021-11-24 (×6): qty 500
  Filled 2021-11-24 (×2): qty 5
  Filled 2021-11-24 (×3): qty 500
  Filled 2021-11-24: qty 5
  Filled 2021-11-24 (×7): qty 500
  Filled 2021-11-24: qty 5

## 2021-11-24 MED ORDER — HEPARIN (PORCINE) 25000 UT/250ML-% IV SOLN
2700.0000 [IU]/h | INTRAVENOUS | Status: DC
  Administered 2021-11-24 (×2): 2500 [IU]/h via INTRAVENOUS
  Filled 2021-11-24 (×3): qty 250

## 2021-11-24 MED ORDER — TRACE MINERALS CU-MN-SE-ZN 300-55-60-3000 MCG/ML IV SOLN
INTRAVENOUS | Status: DC
  Filled 2021-11-24: qty 640

## 2021-11-24 MED ORDER — INSULIN ASPART 100 UNIT/ML IJ SOLN
0.0000 [IU] | Freq: Four times a day (QID) | INTRAMUSCULAR | Status: DC
  Administered 2021-11-24 – 2021-11-25 (×5): 2 [IU] via SUBCUTANEOUS
  Administered 2021-11-26: 3 [IU] via SUBCUTANEOUS
  Administered 2021-11-26 (×2): 2 [IU] via SUBCUTANEOUS
  Administered 2021-11-27: 3 [IU] via SUBCUTANEOUS
  Administered 2021-11-27: 2 [IU] via SUBCUTANEOUS

## 2021-11-24 MED ORDER — BOOST / RESOURCE BREEZE PO LIQD CUSTOM
1.0000 | Freq: Three times a day (TID) | ORAL | Status: DC
  Administered 2021-11-25 – 2021-11-28 (×3): 1 via ORAL

## 2021-11-24 MED ORDER — SODIUM CHLORIDE 0.9 % IV SOLN
3.0000 g | Freq: Four times a day (QID) | INTRAVENOUS | Status: DC
  Filled 2021-11-24: qty 8

## 2021-11-24 MED ORDER — PIPERACILLIN-TAZOBACTAM 3.375 G IVPB
3.3750 g | Freq: Three times a day (TID) | INTRAVENOUS | Status: DC
  Administered 2021-11-24 – 2021-11-26 (×7): 3.375 g via INTRAVENOUS
  Filled 2021-11-24 (×7): qty 50

## 2021-11-24 NOTE — Progress Notes (Signed)
PHARMACY - TOTAL PARENTERAL NUTRITION CONSULT NOTE   Indication: Prolonged ileus  Patient Measurements: Height: 5' 6.93" (170 cm) Weight: 121.5 kg (267 lb 13.7 oz) IBW/kg (Calculated) : 65.94 TPN AdjBW (KG): 79.8 Body mass index is 42.04 kg/m. Usual Weight:   Assessment:  Pharmacy is consulted to start TPN on 66 yo male with prolonged ileus. Pt post-op exploratory laparotomy and drainage of pelvic abscess.   Glucose / Insulin: ( Goal < 150) - Has been controlled  Electrolytes: WNL, inc CorrCa which is 9.7 mg/dl  Renal: Scr 1.63, trending up, BUN 20  Hepatic: WNL  Intake / Output; MIVF: none  GI Imaging: GI Surgeries / Procedures:  6/20 Pt post-op exploratory laparotomy and drainage of pelvic abscess.  Central access: 11/08/2021 TPN start date: 11/24/2021  Nutritional Goals: Goal TPN rate is -- mL/hr (provides --- g of protein and --- kcals per day)  RD Assessment:    Current Nutrition:  Clear liquids  Plan:  Start TPN at 9m/hr at 1800 Electrolytes in TPN: Na 589m/L, K 5061mL, Ca 5mE77m, Mg 5mEq108m and Phos 15mmo30m Cl:Ac 1:1 Add standard MVI and trace elements to TPN Has been on moderate SSI AC. Will adjust to q6h.   Monitor TPN labs on Mon/Thurs    NikolaRoyetta AsalmD, BCPS 11/24/2021 11:47 AM

## 2021-11-24 NOTE — Progress Notes (Signed)
PT Cancellation Note  Patient Details Name: Robert Larson MRN: [id number] DOB: 12-13-1955   Cancelled Treatment:     POd # 1 pt politely declined "not today".  Pt has been evaluated.  Will attempt to see another day as schedule permits.  Rica Koyanagi  PTA Acute  Rehabilitation Services Office M-F          [phone] Weekend pager [phone]

## 2021-11-24 NOTE — Progress Notes (Signed)
OT Cancellation Note  Patient Details Name: Robert Larson MRN: [id number] DOB: Jun 06, 1956   Cancelled Treatment:    Reason Eval/Treat Not Completed: Other (comment). Pt stated "not today" to therapy, "I'm in too much pain when I move".  [place], OTR/L Acute Re[facility] [phone] Office [phone]    Almon Register 11/24/2021, 4:07 PM

## 2021-11-24 NOTE — Consult Note (Signed)
[place] Nurse Consult Note: NPWT supplies ordered to room in anticipation of first NPWT dressing chance on Friday, 11/26/21.  2 Medium NPWT dressing kits to be delivered to room.  [facility] nursing team will follow for M/W/F routine NPWT dressing changes, and will remain available to this patient, the nursing, surgical  and medical teams.    Thank you for inviting [place] to participate in this patient's Plan of Care.  Maudie Flakes, MSN, RN, CNS, [place], Serita Grammes, [employer], [employer] phone:  [phone]

## 2021-11-24 NOTE — TOC Progression Note (Signed)
Transition of Care [facility]) - Progression Note    Patient Details  Name: Robert Larson MRN: [id number] Date of Birth: Apr 16, 1956  Transition of Care [facility]) CM/SW Contact  Leeroy Cha, RN Phone Number: 11/24/2021, 8:30 AM  Clinical Narrative:    Exp.lap and drainage of abscess on 062021.  Following for toc needs.     Barriers to Discharge: Continued Medical Work up  Expected Discharge Plan and Services                                                 Social Determinants of Health (SDOH) Interventions    Readmission Risk Interventions     No data to display

## 2021-11-24 NOTE — Progress Notes (Signed)
ANTICOAGULATION CONSULT NOTE - Follow Up Consult  Pharmacy Consult for Heparin Indication: atrial fibrillation  No Known Allergies  Patient Measurements: Height: 5' 6.93" (170 cm) Weight: 121.5 kg (267 lb 13.7 oz) IBW/kg (Calculated) : 65.94 Heparin Dosing Weight:  94 kg  Vital Signs: Temp: 98.9 F (37.2 C) (06/21 0932) Temp Source: Oral (06/21 0932) BP: 127/67 (06/21 0932) Pulse Rate: 69 (06/21 0932)  Labs: Recent Labs    11/22/21 0435 11/23/21 0400 11/23/21 1453 11/24/21 0512  HGB 11.4* 10.5* 9.6* 9.3*  HCT 34.6* 32.3* 29.9* 28.8*  PLT 291 286 310 288  HEPARINUNFRC 0.69 0.71*  --  <0.10*  CREATININE 1.39* 1.29*  --  1.63*     Estimated Creatinine Clearance: 57.1 mL/min (A) (by C-G formula based on SCr of 1.63 mg/dL (H)).   Assessment: 66 yo male presented with severe sepsis 2/2 intra-abdominal abscess.  Patient now with new onset atrial fibrillation w/ RVR.  Pharmacy consulted to dose heparin IV for Afib.  Heparin was turned off prior to procedure. Ok to resume today per discussion with TRH and CCS PA.   Today, 11/24/21: - HL 0.71 (slightly supratherapeutic) with heparin gtt @ 2750 units/hr - Hgb low (10.5), plts WNL - RN reports blood ozzing from JP drain and provider made aware  Goal of Therapy:  Heparin level 0.3-0.7 units/ml ( Ok to resume this target per CCS PA) Monitor platelets by anticoagulation protocol: Yes   Plan:  Resume  heparin drip at 2500 units/hr Check heparin level in 6 hr after restart of infusion  Daily HL and CBC GI problems limiting transition to [place], PharmD, BCPS 11/24/2021 11:17 AM

## 2021-11-24 NOTE — Progress Notes (Addendum)
1 Day Post-Op  Subjective: CC: Family bedside.  Patient with a left-sided abdominal cramping.  No nausea or vomiting.  Tolerating clears.  No flatus or BM.  Has not been out of bed.  Foley in place.  Discussed OR findings.   Objective: Vital signs in last 24 hours: Temp:  [97.7 F (36.5 C)-98.9 F (37.2 C)] 98.9 F (37.2 C) (06/21 0932) Pulse Rate:  [57-76] 69 (06/21 0932) Resp:  [13-20] 16 (06/21 0932) BP: (106-156)/(67-97) 127/67 (06/21 0932) SpO2:  [95 %-100 %] 99 % (06/21 0932) Weight:  [121.5 kg] 121.5 kg (06/20 1041) Last BM Date : 11/21/21  Intake/Output from previous day: 06/20 0701 - 06/21 0700 In: 2635 [P.O.:170; I.V.:1660; IV Piggyback:805] Out: 8850 [Urine:780; Drains:105; Blood:500] Intake/Output this shift: No intake/output data recorded.  PE: Gen:  Alert, NAD, pleasant Pulm: rate and effort normal, on o2 Abd: Soft, mild distension, some generalized abdominal tenderness that is greater on the left abdomen and around his incision without any peritonitis. Slightly hypoactive bowel sounds. JP with scant bloody output.  Wound VAC in place with good seal, scant output in canister.   Ext:  No LE edema Psych: A&Ox3   Lab Results:  Recent Labs    11/23/21 1453 11/24/21 0512  WBC 9.7 11.4*  HGB 9.6* 9.3*  HCT 29.9* 28.8*  PLT 310 288   BMET Recent Labs    11/23/21 0400 11/24/21 0512  NA 135 136  K 3.6 4.5  CL 103 104  CO2 25 25  GLUCOSE 111* 120*  BUN 13 20  CREATININE 1.29* 1.63*  CALCIUM 8.3* 8.3*   PT/INR No results for input(s): "LABPROT", "INR" in the last 72 hours. CMP     Component Value Date/Time   NA 136 11/24/2021 0512   NA 133 (A) 06/24/2021 0000   K 4.5 11/24/2021 0512   CL 104 11/24/2021 0512   CO2 25 11/24/2021 0512   GLUCOSE 120 (H) 11/24/2021 0512   BUN 20 11/24/2021 0512   BUN 18 06/24/2021 0000   CREATININE 1.63 (H) 11/24/2021 0512   CALCIUM 8.3 (L) 11/24/2021 0512   PROT 6.5 11/24/2021 0512   ALBUMIN 2.2 (L)  11/24/2021 0512   AST 17 11/24/2021 0512   ALT 12 11/24/2021 0512   ALKPHOS 32 (L) 11/24/2021 0512   BILITOT 1.0 11/24/2021 0512   GFRNONAA 47 (L) 11/24/2021 0512   GFRAA >60 02/19/2018 1123   Lipase     Component Value Date/Time   LIPASE 40 11/07/2021 1029    Studies/Results: DG C-Arm 1-60 Min-No Report  Result Date: 11/23/2021 Fluoroscopy was utilized by the requesting physician.  No radiographic interpretation.   CT ABDOMEN PELVIS W CONTRAST  Result Date: 11/22/2021 CLINICAL DATA:  Abdominal pain. EXAM: CT ABDOMEN AND PELVIS WITH CONTRAST TECHNIQUE: Multidetector CT imaging of the abdomen and pelvis was performed using the standard protocol following bolus administration of intravenous contrast. RADIATION DOSE REDUCTION: This exam was performed according to the departmental dose-optimization program which includes automated exposure control, adjustment of the mA and/or kV according to patient size and/or use of iterative reconstruction technique. CONTRAST:  166m OMNIPAQUE IOHEXOL 300 MG/ML  SOLN COMPARISON:  11/18/2021 and earlier FINDINGS: Lower chest: There are bilateral pleural effusions. Bibasilar atelectasis and LEFT LOWER lobe consolidation are similar to prior study. Heart size is normal. Hepatobiliary: Cirrhotic contour of the liver. A 3.1 centimeter low-attenuation lesion is identified in the posterior segment of the RIGHT hepatic lobe, not seen on prior noncontrast exams. Further  evaluation is indicated. Gallbladder is distended.  No radiopaque calculi. Pancreas: Unremarkable. No pancreatic ductal dilatation or surrounding inflammatory changes. Spleen: Normal in size without focal abnormality. Adrenals/Urinary Tract: A 1.1 centimeter nodule in the RIGHT adrenal gland is stable. A 2.5 centimeter nodule in the LEFT adrenal gland is stable. Numerous bilateral renal cysts are present. Largest is in the LEFT kidney, 5.9 centimeters. No hydronephrosis. Ureters are unremarkable. Urinary  bladder is distended and otherwise normal. Stomach/Bowel: Stomach and small bowel loops are normal in appearance. Contrast is seen in the colon to the level of the anus. There are numerous colonic diverticula. There is focal wall thickening in the region of the redundant sigmoid colon. There is moderate mesenteric edema. Small amount of mesenteric fluid identified adjacent to the hepatic segment of the colon and in the RIGHT UPPER QUADRANT, without evidence for abscess. The largest fluid collection is 7.3 x 2.9 x 2.0 centimeters. RIGHT LOWER QUADRANT a drain is in place. No significant fluid collections around the drain. There has been almost complete resolution of air-fluid collection between the bladder in the rectum (image 69 of series 2 and 74 of series 6). This collection now measures 6.2 by 2.2 centimeters. Previously, this measured 9.4 x 6.6 centimeters. Stable appearance of small air-fluid collection in the LEFT central pelvis measuring 6.4 x 3.5 centimeters (image 57 of series 2). Significantly smaller oval collection of fluid contains pigtail drain in the LEFT mid abdomen, now measuring 5.5 x 1.7 centimeters and previously 6.3 centimeters. (Image 46 of series 2). Vascular/Lymphatic: There is atherosclerotic calcification of the abdominal aorta. Small para aortic lymph nodes are likely reactive. Reproductive: Prostate is unremarkable. Other: There is edema of the LATERAL abdominal wall bilaterally, LEFT greater than RIGHT. Musculoskeletal: Degenerative changes in the LOWER lumbar spine. Prior LEFT hip arthroplasty. IMPRESSION: 1. Improving multiple fluid collections within the abdomen and pelvis. 2. No significant fluid around the RIGHT LOWER lobe drain. 3. Small fluid around the LEFT LOWER lobe drain, with significant improvement in this collection. 4. Near complete resolution of air fluid collection between the rectum and the bladder. 5. Stable small collection in the central pelvis. 6. Diverticulosis. 7.  Indeterminate low-attenuation lesion in the RIGHT hepatic lobe measuring 3.1 centimeters. This has not been fully characterized. In the setting of cirrhosis, further characterization is needed. Recommend non emergent outpatient MRI of the liver. 8.  Aortic atherosclerosis.  (ICD10-I70.0) 9. Abdominal wall edema. 10. Bilateral pleural effusions, bibasilar atelectasis, and LEFT LOWER lobe consolidation are similar. 11. Stable bilateral adrenal nodules. Electronically Signed   By: Nolon Nations M.D.   On: 11/22/2021 12:02    Anti-infectives: Anti-infectives (From admission, onward)    Start     Dose/Rate Route Frequency Ordered Stop   11/24/21 1200  Ampicillin-Sulbactam (UNASYN) 3 g in sodium chloride 0.9 % 100 mL IVPB        3 g 200 mL/hr over 30 Minutes Intravenous Every 6 hours 11/24/21 0900     11/23/21 0830  piperacillin-tazobactam (ZOSYN) IVPB 3.375 g  Status:  Discontinued        3.375 g 12.5 mL/hr over 240 Minutes Intravenous Every 8 hours 11/23/21 0736 11/24/21 0858   11/22/21 1200  Ampicillin-Sulbactam (UNASYN) 3 g in sodium chloride 0.9 % 100 mL IVPB  Status:  Discontinued        3 g 200 mL/hr over 30 Minutes Intravenous Every 6 hours 11/22/21 0923 11/23/21 0736   11/20/21 1645  micafungin (MYCAMINE) 100 mg in sodium chloride 0.9 %  100 mL IVPB        100 mg 105 mL/hr over 1 Hours Intravenous Every 24 hours 11/20/21 1554     11/08/21 0730  vancomycin (VANCOREADY) IVPB 1750 mg/350 mL        1,750 mg 175 mL/hr over 120 Minutes Intravenous  Once 11/08/21 0641 11/08/21 0908   11/08/21 0641  vancomycin variable dose per unstable renal function (pharmacist dosing)  Status:  Discontinued         Does not apply See admin instructions 11/08/21 0641 11/08/21 1138   11/07/21 2000  piperacillin-tazobactam (ZOSYN) IVPB 3.375 g  Status:  Discontinued        3.375 g 12.5 mL/hr over 240 Minutes Intravenous Every 8 hours 11/07/21 1921 11/22/21 0923   11/07/21 1430  piperacillin-tazobactam (ZOSYN)  IVPB 3.375 g        3.375 g 100 mL/hr over 30 Minutes Intravenous  Once 11/07/21 1418 11/07/21 1535        Assessment/Plan POD 1 s/p exploratory laparotomy and drainage of pelvic abscess by Dr. Donne Hazel for suspected complicated diverticulitis not responding to medical therapy/drains - Intra-Op patient found to have good portion of his abdomen frozen so were unable to safely resect the sigmoid colon or divert him. The pelvic fluid collection that was noted on CT was able to be drained w/ 52F blake drain left behind in this area. IR drain are out.  - Hopefully patient will get better with drain and prolonged antibiotics and we will be able to revisit OR at a later date. - Keep on CLD. High risk for ileus. Add boost breeze/supplement.  - Start TPN.  Unclear full ability use patient's IJ line given 1/3 ports is occluded.  May need PICC.  - Cont JP drain - Cont abx. IR cx w/ Candida tropicalis.  Currently on Micafungin for coverage.  Also on Unasyn.  Would recommend broadening Unasyn to Zosyn for better intra-abdominal coverage.  Likely will need to go home on long-term antibiotics.  ID is following. Will send them a message - VAC M/W/F (to start Friday). WOCN consulted. - Will discuss w/ MD timing of foley removal (POD 1 vs 2) - Mobilize, PT ordered - Daily labs  FEN - CLD, IVF per TRH, start TPN VTE - SCDs, hemoglobin stable.  Okay to restart heparin drip at 10 AM with no bolus ID - Currently on Unasyn and micafungin. See above, would recommend switching Unasyn to Zosyn Foley - In place, clear, UOP 0.3 ml/kg/hr  A. Fib with RVR - resolved, lopressor. Okay to  AKI - Cr up today at 1.63 Acute respiratory failure with hypoxia - improved. 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

## 2021-11-24 NOTE — Progress Notes (Signed)
Peripherally Inserted Central Catheter Placement  The IV Nurse has discussed with the patient and/or persons authorized to consent for the patient, the purpose of this procedure and the potential benefits and risks involved with this procedure.  The benefits include less needle sticks, lab draws from the catheter, and the patient may be discharged home with the catheter. Risks include, but not limited to, infection, bleeding, blood clot (thrombus formation), and puncture of an artery; nerve damage and irregular heartbeat and possibility to perform a PICC exchange if needed/ordered by physician.  Alternatives to this procedure were also discussed.  Bard Power PICC patient education guide, fact sheet on infection prevention and patient information card has been provided to patient /or left at bedside.    PICC Placement Documentation  PICC Double Lumen 25/49/82 Right Basilic 45 cm 0 cm (Active)  Indication for Insertion or Continuance of Line Administration of hyperosmolar/irritating solutions (i.e. TPN, Vancomycin, etc.) 11/24/21 1249  Exposed Catheter (cm) 0 cm 11/24/21 1249  Site Assessment Clean, Dry, Intact 11/24/21 1249  Lumen #1 Status Blood return noted;Flushed;Saline locked 11/24/21 1249  Lumen #2 Status Blood return noted;Saline locked;Flushed 11/24/21 1249  Dressing Type Transparent;Securing device 11/24/21 1249  Dressing Status Antimicrobial disc in place 11/24/21 1249  Dressing Intervention New dressing;Other (Comment) 11/24/21 1249  Dressing Change Due 12/01/21 11/24/21 1249   Daughter singed consent at bedside    Synthia Innocent 11/24/2021, 12:50 PM

## 2021-11-24 NOTE — Progress Notes (Signed)
ANTICOAGULATION CONSULT NOTE - Follow Up Consult  Pharmacy Consult for Heparin Indication: atrial fibrillation  No Known Allergies  Patient Measurements: Height: 5' 6.93" (170 cm) Weight: 121.5 kg (267 lb 13.7 oz) IBW/kg (Calculated) : 65.94 Heparin Dosing Weight:  94 kg  Vital Signs: Temp: 99.1 F (37.3 C) (06/21 2055) Temp Source: Oral (06/21 2055) BP: 137/61 (06/21 2055) Pulse Rate: 71 (06/21 2055)  Labs: Recent Labs    11/22/21 0435 11/23/21 0400 11/23/21 1453 11/24/21 0512 11/24/21 2300  HGB 11.4* 10.5* 9.6* 9.3*  --   HCT 34.6* 32.3* 29.9* 28.8*  --   PLT 291 286 310 288  --   HEPARINUNFRC 0.69 0.71*  --  <0.10* 0.14*  CREATININE 1.39* 1.29*  --  1.63*  --      Estimated Creatinine Clearance: 57.1 mL/min (A) (by C-G formula based on SCr of 1.63 mg/dL (H)).   Assessment: 66 yo male presented with severe sepsis 2/2 intra-abdominal abscess.  Patient now with new onset atrial fibrillation w/ RVR.  Pharmacy consulted to dose heparin IV for Afib.  Heparin was turned off prior to procedure. Ok to resume today per discussion with TRH and CCS PA.   Today, 11/24/21: - HL 0.14 (subtherapeutic) with heparin gtt @ 2500 units/hr - RN reports no oozing; no complications of therapy noted  Goal of Therapy:  Heparin level 0.3-0.7 units/ml ( Ok to resume this target per CCS PA) Monitor platelets by anticoagulation protocol: Yes   Plan:  Increase heparin drip to 2700 units/hr Check heparin level in 6 hr after heparin rate increase Daily HL and CBC GI problems limiting transition to [place], PharmD 11/24/2021 11:48 PM

## 2021-11-24 NOTE — Progress Notes (Signed)
Subjective:   He is complaining of some spasming of his muscles where he had surgery  Antibiotics:  Anti-infectives (From admission, onward)    Start     Dose/Rate Route Frequency Ordered Stop   11/24/21 1400  piperacillin-tazobactam (ZOSYN) IVPB 3.375 g        3.375 g 12.5 mL/hr over 240 Minutes Intravenous Every 8 hours 11/24/21 1018     11/24/21 1200  Ampicillin-Sulbactam (UNASYN) 3 g in sodium chloride 0.9 % 100 mL IVPB  Status:  Discontinued        3 g 200 mL/hr over 30 Minutes Intravenous Every 6 hours 11/24/21 0900 11/24/21 1018   11/23/21 0830  piperacillin-tazobactam (ZOSYN) IVPB 3.375 g  Status:  Discontinued        3.375 g 12.5 mL/hr over 240 Minutes Intravenous Every 8 hours 11/23/21 0736 11/24/21 0858   11/22/21 1200  Ampicillin-Sulbactam (UNASYN) 3 g in sodium chloride 0.9 % 100 mL IVPB  Status:  Discontinued        3 g 200 mL/hr over 30 Minutes Intravenous Every 6 hours 11/22/21 0923 11/23/21 0736   11/20/21 1645  micafungin (MYCAMINE) 100 mg in sodium chloride 0.9 % 100 mL IVPB        100 mg 105 mL/hr over 1 Hours Intravenous Every 24 hours 11/20/21 1554     11/08/21 0730  vancomycin (VANCOREADY) IVPB 1750 mg/350 mL        1,750 mg 175 mL/hr over 120 Minutes Intravenous  Once 11/08/21 0641 11/08/21 0908   11/08/21 0641  vancomycin variable dose per unstable renal function (pharmacist dosing)  Status:  Discontinued         Does not apply See admin instructions 11/08/21 0641 11/08/21 1138   11/07/21 2000  piperacillin-tazobactam (ZOSYN) IVPB 3.375 g  Status:  Discontinued        3.375 g 12.5 mL/hr over 240 Minutes Intravenous Every 8 hours 11/07/21 1921 11/22/21 0923   11/07/21 1430  piperacillin-tazobactam (ZOSYN) IVPB 3.375 g        3.375 g 100 mL/hr over 30 Minutes Intravenous  Once 11/07/21 1418 11/07/21 1535       Medications: Scheduled Meds:  amLODipine  5 mg Oral Daily   Chlorhexidine Gluconate Cloth  6 each Topical Daily   feeding  supplement  1 Container Oral TID BM   feeding supplement  237 mL Oral BID BM   furosemide  40 mg Intravenous Daily   insulin aspart  0-15 Units Subcutaneous TID WC   lip balm  1 application  Topical BID   mouth rinse  15 mL Mouth Rinse q12n4p   metoprolol succinate  50 mg Oral Daily   pantoprazole  40 mg Oral BID   sodium chloride flush  10-40 mL Intracatheter Q12H   Continuous Infusions:  sodium chloride 250 mL (11/23/21 1629)   sodium chloride Stopped (11/09/21 1144)   sodium chloride 10 mL/hr at 11/19/21 0600   methocarbamol (ROBAXIN) IV 500 mg (11/23/21 2258)   micafungin (MYCAMINE) 100 mg in sodium chloride 0.9 % 100 mL IVPB 100 mg (11/23/21 1630)   ondansetron (ZOFRAN) IV     piperacillin-tazobactam (ZOSYN)  IV     PRN Meds:.Place/Maintain arterial line **AND** sodium chloride, sodium chloride, acetaminophen **OR** [DISCONTINUED] acetaminophen, alum & mag hydroxide-simeth, dextromethorphan-guaiFENesin, hydrALAZINE, ipratropium-albuterol, magic mouthwash, menthol-cetylpyridinium, metoprolol tartrate, morphine injection, ondansetron (ZOFRAN) IV **OR** ondansetron (ZOFRAN) IV, mouth rinse, oxyCODONE, phenol, prochlorperazine, senna-docusate, simethicone, sodium chloride flush, traZODone  Objective: Weight change:   Intake/Output Summary (Last 24 hours) at 11/24/2021 1103 Last data filed at 11/24/2021 1038 Gross per 24 hour  Intake 2945 ml  Output 2185 ml  Net 760 ml    Blood pressure 127/67, pulse 69, temperature 98.9 F (37.2 C), temperature source Oral, resp. rate 16, height 5' 6.93" (1.7 m), weight 121.5 kg, SpO2 99 %. Temp:  [97.7 F (36.5 C)-98.9 F (37.2 C)] 98.9 F (37.2 C) (06/21 0932) Pulse Rate:  [66-76] 69 (06/21 0932) Resp:  [13-20] 16 (06/21 0932) BP: (106-156)/(67-97) 127/67 (06/21 0932) SpO2:  [96 %-100 %] 99 % (06/21 0932)  Physical Exam: Physical Exam Constitutional:      Appearance: He is well-developed.  HENT:     Head: Normocephalic and  atraumatic.  Eyes:     Conjunctiva/sclera: Conjunctivae normal.  Cardiovascular:     Rate and Rhythm: Normal rate and regular rhythm.  Pulmonary:     Effort: Pulmonary effort is normal. No respiratory distress.     Breath sounds: No wheezing.  Abdominal:     General: There is distension.  Musculoskeletal:        General: Normal range of motion.     Cervical back: Normal range of motion and neck supple.  Skin:    General: Skin is warm and dry.     Findings: No erythema or rash.  Neurological:     General: No focal deficit present.     Mental Status: He is alert and oriented to person, place, and time.  Psychiatric:        Mood and Affect: Mood normal.        Behavior: Behavior normal.        Thought Content: Thought content normal.        Judgment: Judgment normal.     New drains in place  CBC:    BMET Recent Labs    11/23/21 0400 11/24/21 0512  NA 135 136  K 3.6 4.5  CL 103 104  CO2 25 25  GLUCOSE 111* 120*  BUN 13 20  CREATININE 1.29* 1.63*  CALCIUM 8.3* 8.3*      Liver Panel  Recent Labs    11/23/21 0400 11/24/21 0512  PROT 6.8 6.5  ALBUMIN 1.9* 2.2*  AST 19 17  ALT 15 12  ALKPHOS 37* 32*  BILITOT 0.8 1.0       Sedimentation Rate No results for input(s): "ESRSEDRATE" in the last 72 hours. C-Reactive Protein No results for input(s): "CRP" in the last 72 hours.  Micro Results: Recent Results (from the past 720 hour(s))  Culture, blood (routine x 2)     Status: None   Collection Time: 11/07/21  3:10 PM   Specimen: BLOOD RIGHT FOREARM  Result Value Ref Range Status   Specimen Description   Final    BLOOD RIGHT FOREARM Performed at [facility], [address] [place], [place] [address]    Special Requests   Final    BOTTLES DRAWN AEROBIC AND ANAEROBIC Blood Culture adequate volume Performed at [facility], [address], [place], [place] [address]    Culture   Final    NO GROWTH 5  DAYS Performed at [facility], [place] [address] [place], [place] [address]    Report Status 11/12/2021 FINAL  Final  Culture, blood (routine x 2)     Status: None   Collection Time: 11/07/21  3:12 PM   Specimen: BLOOD LEFT FOREARM  Result Value Ref  Range Status   Specimen Description   Final    BLOOD LEFT FOREARM Performed at [facility], [address], [place], [place] [address]    Special Requests   Final    BOTTLES DRAWN AEROBIC AND ANAEROBIC Blood Culture adequate volume Performed at [facility], [address], [place], [place] [address]    Culture   Final    NO GROWTH 5 DAYS Performed at [facility], [place] [address] [place], [place] [address]    Report Status 11/12/2021 FINAL  Final  Surgical PCR screen     Status: None   Collection Time: 11/08/21  2:06 AM   Specimen: Nasal Mucosa; Nasal Swab  Result Value Ref Range Status   MRSA, PCR NEGATIVE NEGATIVE Final   Staphylococcus aureus NEGATIVE NEGATIVE Final    Comment: (NOTE) The Xpert SA Assay (FDA approved for NASAL specimens in patients 50 years of age and older), is one component of a comprehensive surveillance program. It is not intended to diagnose infection nor to guide or monitor treatment. Performed at [facility], [place] [address] [place], [place] [address]   Aerobic/Anaerobic Culture w Gram Stain (surgical/deep wound)     Status: None   Collection Time: 11/08/21  1:25 PM   Specimen: Abscess  Result Value Ref Range Status   Specimen Description   Final    ABSCESS DEEP PELVIC DRAIN Performed at [facility] [address] [place], [place] [address]    Special Requests   Final    NONE Performed at [facility], [place] [address] [place], [place] [address]    Gram Stain   Final    RARE WBC PRESENT,BOTH PMN AND MONONUCLEAR ABUNDANT GRAM POSITIVE COCCI IN PAIRS MODERATE GRAM NEGATIVE RODS RARE  GRAM POSITIVE RODS Performed at [facility], [place] [address] [place], [place] [address]    Culture   Final    MODERATE ESCHERICHIA COLI MIXED ANAEROBIC FLORA PRESENT.  CALL LAB IF FURTHER IID REQUIRED.    Report Status 11/11/2021 FINAL  Final   Organism ID, Bacteria ESCHERICHIA COLI  Final      Susceptibility   Escherichia coli - MIC*    AMPICILLIN 4 SENSITIVE Sensitive     CEFAZOLIN <=4 SENSITIVE Sensitive     CEFEPIME <=0.12 SENSITIVE Sensitive     CEFTAZIDIME <=1 SENSITIVE Sensitive     CEFTRIAXONE <=0.25 SENSITIVE Sensitive     CIPROFLOXACIN <=0.25 SENSITIVE Sensitive     GENTAMICIN <=1 SENSITIVE Sensitive     IMIPENEM <=0.25 SENSITIVE Sensitive     TRIMETH/SULFA <=20 SENSITIVE Sensitive     AMPICILLIN/SULBACTAM <=2 SENSITIVE Sensitive     PIP/TAZO <=4 SENSITIVE Sensitive     * MODERATE ESCHERICHIA COLI  Aerobic/Anaerobic Culture w Gram Stain (surgical/deep wound)     Status: None (Preliminary result)   Collection Time: 11/18/21  4:08 PM   Specimen: Abscess  Result Value Ref Range Status   Specimen Description ABSCESS ABDOMEN  Final   Special Requests LEFT LATERAL ABDOMEN DRAIN NO 2  Final   Gram Stain   Final    NO SQUAMOUS EPITHELIAL CELLS SEEN ABUNDANT WBC SEEN NO ORGANISMS SEEN    Culture   Final    RARE CANDIDA TROPICALIS NO ANAEROBES ISOLATED Sent to [facility] for further susceptibility testing. Performed at [facility], [place] [address] [place],  [address]    Report Status PENDING  Incomplete  Aerobic/Anaerobic Culture w Gram Stain (  surgical/deep wound)     Status: None   Collection Time: 11/18/21  4:08 PM   Specimen: Abscess  Result Value Ref Range Status   Specimen Description ABSCESS ABDOMEN  Final   Special Requests RT LOWER QUAD DRAIN NO 1  Final   Gram Stain   Final    NO SQUAMOUS EPITHELIAL CELLS SEEN MODERATE WBC SEEN NO ORGANISMS SEEN    Culture   Final    No growth aerobically or anaerobically. Performed at [facility], [place] [address], [place], [place] [address]    Report Status 11/23/2021 FINAL  Final    Studies/Results: [place] EKG SITE RITE  Result Date: 11/24/2021 If Site Rite image not attached, placement could not be confirmed due to current cardiac rhythm.  DG C-Arm 1-60 Min-No Report  Result Date: 11/23/2021 Fluoroscopy was utilized by the requesting physician.  No radiographic interpretation.   CT ABDOMEN PELVIS W CONTRAST  Result Date: 11/22/2021 CLINICAL DATA:  Abdominal pain. EXAM: CT ABDOMEN AND PELVIS WITH CONTRAST TECHNIQUE: Multidetector CT imaging of the abdomen and pelvis was performed using the standard protocol following bolus administration of intravenous contrast. RADIATION DOSE REDUCTION: This exam was performed according to the departmental dose-optimization program which includes automated exposure control, adjustment of the mA and/or kV according to patient size and/or use of iterative reconstruction technique. CONTRAST:  125m OMNIPAQUE IOHEXOL 300 MG/ML  SOLN COMPARISON:  11/18/2021 and earlier FINDINGS: Lower chest: There are bilateral pleural effusions. Bibasilar atelectasis and LEFT LOWER lobe consolidation are similar to prior study. Heart size is normal. Hepatobiliary: Cirrhotic contour of the liver. A 3.1 centimeter low-attenuation lesion is identified in the posterior segment of the RIGHT hepatic lobe, not seen on prior noncontrast exams. Further evaluation is indicated. Gallbladder is distended.  No radiopaque calculi. Pancreas: Unremarkable. No pancreatic ductal dilatation or surrounding inflammatory changes. Spleen: Normal in size without focal abnormality. Adrenals/Urinary Tract: A 1.1 centimeter nodule in the RIGHT adrenal gland is stable. A 2.5 centimeter nodule in the LEFT adrenal gland is stable. Numerous bilateral renal cysts are present. Largest is in the LEFT kidney, 5.9 centimeters. No hydronephrosis. Ureters are unremarkable. Urinary bladder is distended and  otherwise normal. Stomach/Bowel: Stomach and small bowel loops are normal in appearance. Contrast is seen in the colon to the level of the anus. There are numerous colonic diverticula. There is focal wall thickening in the region of the redundant sigmoid colon. There is moderate mesenteric edema. Small amount of mesenteric fluid identified adjacent to the hepatic segment of the colon and in the RIGHT UPPER QUADRANT, without evidence for abscess. The largest fluid collection is 7.3 x 2.9 x 2.0 centimeters. RIGHT LOWER QUADRANT a drain is in place. No significant fluid collections around the drain. There has been almost complete resolution of air-fluid collection between the bladder in the rectum (image 69 of series 2 and 74 of series 6). This collection now measures 6.2 by 2.2 centimeters. Previously, this measured 9.4 x 6.6 centimeters. Stable appearance of small air-fluid collection in the LEFT central pelvis measuring 6.4 x 3.5 centimeters (image 57 of series 2). Significantly smaller oval collection of fluid contains pigtail drain in the LEFT mid abdomen, now measuring 5.5 x 1.7 centimeters and previously 6.3 centimeters. (Image 46 of series 2). Vascular/Lymphatic: There is atherosclerotic calcification of the abdominal aorta. Small para aortic lymph nodes are likely reactive. Reproductive: Prostate is unremarkable. Other: There is edema of the LATERAL abdominal wall bilaterally, LEFT greater than RIGHT. Musculoskeletal: Degenerative changes  in the LOWER lumbar spine. Prior LEFT hip arthroplasty. IMPRESSION: 1. Improving multiple fluid collections within the abdomen and pelvis. 2. No significant fluid around the RIGHT LOWER lobe drain. 3. Small fluid around the LEFT LOWER lobe drain, with significant improvement in this collection. 4. Near complete resolution of air fluid collection between the rectum and the bladder. 5. Stable small collection in the central pelvis. 6. Diverticulosis. 7. Indeterminate  low-attenuation lesion in the RIGHT hepatic lobe measuring 3.1 centimeters. This has not been fully characterized. In the setting of cirrhosis, further characterization is needed. Recommend non emergent outpatient MRI of the liver. 8.  Aortic atherosclerosis.  (ICD10-I70.0) 9. Abdominal wall edema. 10. Bilateral pleural effusions, bibasilar atelectasis, and LEFT LOWER lobe consolidation are similar. 11. Stable bilateral adrenal nodules. Electronically Signed   By: Nolon Nations M.D.   On: 11/22/2021 12:02      Assessment/Plan:  INTERVAL HISTORY:  Status post exploratory laparotomy with drainage of pelvic abscess--the patient's intra-abdominal pathology was not amenable to more definitive surgery due to hemorrhage multiple areas of fused bowel omental adhesion adhesions and concern for potential further perforation of bowel and creation of fistulas.   Principal Problem:   Intra-abdominal abscesses s/p perc drainage 11/08/2021 Active Problems:   Essential hypertension   Type 2 diabetes mellitus with morbid obesity (HCC)   Diverticulosis   Hyponatremia   Acute renal failure superimposed on chronic kidney disease (HCC)   Sleep apnea   Osteoarthritis   CKD (chronic kidney disease) stage 3, GFR 30-59 ml/min (HCC)   Adrenal nodule (HCC)   History of adenomatous polyp of colon   Spinal stenosis of lumbar region   Renal cyst   Cholelithiasis   Coffee ground emesis   Septic shock (HCC)   Tobacco abuse   Abscess     Robert Larson is a 66 y.o. male with with history of chronic kidney disease admitted to the ICU with septic shock and found to have multiloculated fluid collections on the diverticular microperforation status post IR placement in June 13 and on June 15 with E. coli and Candida tropicalis isolated.  Status post exploratory laparotomy with drainage of a pelvic abscess but unfortunately cannot have definitive surgery due to extensive adhesions bleeding and fusion of multiple  areas of bowel.   #1  Abdominal abscesses due to diverticular perforation with unfortunate extensive inflammation and fusion of bowel in the abdomen that precluded definitive surgery  Surgery feel more comfortable with him being on a broader spectrum antibiotic in the form of Zosyn though we do not have culture data to prove he needs this I do not think this is unreasonable so I am supportive of going back to Zosyn.  We will continue micafungin to cover the Candida tropicalis his susceptibilities are not yet back.  He will need long-term antibiotic therapy and I worry quite a bit about his prognosis  Ultimately he will also benefit therefore from discussions re goals of care etc in this very challenging situation but would defer to surgery with regards to that consultation.     LOS: 17 days   Alcide Evener 11/24/2021, 11:03 AM

## 2021-11-24 NOTE — Progress Notes (Signed)
PROGRESS NOTE    Robert Larson  KTG:[id number] DOB: 11-08-1955 DOA: 11/07/2021 PCP: Jeanie Sewer, NP  Brief Narrative:  66 year old with history of DM2, HTN, CKD admitted to the hospital for severe sepsis due to persistent intra-abdominal abscess. CT abdomen pelvis on admit -showed multiple loculated fluid collection in the lower abdomen with a large cavity in the pelvis measuring 11.5 X6X 11.7 cm.  Due to septic shock he was transferred to the ICU on 6/5.  Patient was seen by IR and drain was placed. Hospital course was also complicated by acute kidney injury, hypoxia and atrial fibrillation with RVR. Repeat CT 6/9 -showed resolution of abscess, drain was removed by IR on 11/16/2021. Another CT abdomen 11/17/2021-recurrent pelvic abscess. IR placed left lower quadrant and right lower quadrant drains on 11/18/2021. On 11/21/2021 patient has Candida tropicalis growing in the abscess drainage.  ID consulted patient started on micafungin. Repeat CT abdomen 11/22/2021 with multiple fluid collections.  Patient underwent X lap postdrainage of abscess on 6/20 as well as right ureteral stent placement    Today, patient reported postop abdominal pain, denies any other new complaints.    Assessment & Plan:   Principal Problem:   Intra-abdominal abscesses s/p perc drainage 11/08/2021 Active Problems:   Essential hypertension   Type 2 diabetes mellitus with morbid obesity (HCC)   Diverticulosis   Hyponatremia   Acute renal failure superimposed on chronic kidney disease (HCC)   Sleep apnea   Osteoarthritis   CKD (chronic kidney disease) stage 3, GFR 30-59 ml/min (HCC)   Adrenal nodule (HCC)   History of adenomatous polyp of colon   Spinal stenosis of lumbar region   Renal cyst   Cholelithiasis   Coffee ground emesis   Septic shock (HCC)   Tobacco abuse   Abscess   Recurrent multiple intra-abdominal abscess likely from diverticular micro perforation Septic shock secondary to  above Patient with recurrent multiple intra-abdominal abscess s/p multiple drain placements with persistent abscess, status post ex lap plus drainage of abscess and placement of right ureteral stent on 6/20, noted to have frozen abdomen, not amenable to more definitive surgery, noted multiple areas of fused bowel omental adhesions with noted Intra-Op hemorrhage.  Concern for potential follow-up perforation of bowel and creation of fistulas Follow-up cultures from the drain shows Candida tropicalis.  ID consulted.  Micafungin started. General surgery to restart Zosyn for broader coverage, please PICC line and start TPN Patient will need long-term antibiotics Patient with very poor prognosis, will consider palliative consult if surgery is in agreement Wound VAC placed, WOC consulted Continue Zosyn, micafungin  Acute on chronic anemia Likely 2/2 acute blood loss from surgery Monitor closely, daily CBC, more frequent monitoring if significant bleeding noted  New onset A-fib with RVR Normal TSH normal echo on metoprolol and heparin drip Decreased the dose of metoprolol due to bradycardia We will start p.o. anticoagulation once all surgical procedures are over and surgery permits   AKI on CKD stage IIIa  Worsened s/p surgery Start TPN as per surgery Hold IV lasix Daily BMP  Essential hypertension Continue metoprolol and Norvasc Hold HCTZ, ARB  Obstructive sleep apnea nocturnal CPAP ordered but he refuses most of the nights  Hyperglycemia  A1c was 6.1 on April 2023.  Decrease fingersticks to twice a day and cover with SSI.  Bilateral lower extremity edema  Improved  Hold Lasix 40 mg daily   Patient was on HCTZ and an ARB at home which we will continue to hold  Morbid obesity Estimated body mass index is 42.04 kg/m as calculated from the following:   Height as of this encounter: 5' 6.93" (1.7 m).   Weight as of this encounter: 121.5 kg.   DVT prophylaxis: Heparin  code Status:  Full code Family Communication: Discussed with daughter [name] at bedside on 6/21 disposition Plan:  Status is: Inpatient Remains inpatient appropriate because: Recurrent intra-abdominal abscess   Consultants:  General surgery ID  Procedures: Multiple drain placements, ex lap with drainage of abscess Antimicrobials: Zosyn, micofungin   Objective: Vitals:   11/23/21 2033 11/24/21 0000 11/24/21 0452 11/24/21 0932  BP: 131/73 106/68 (!) 142/97 127/67  Pulse: 76 72 66 69  Resp: '20 20 18 16  '$ Temp: 98.4 F (36.9 C) 98.6 F (37 C) 98.2 F (36.8 C) 98.9 F (37.2 C)  TempSrc: Oral Oral  Oral  SpO2: 100% 99% 99% 99%  Weight:      Height:        Intake/Output Summary (Last 24 hours) at 11/24/2021 1657 Last data filed at 11/24/2021 1038 Gross per 24 hour  Intake 815.25 ml  Output 1590 ml  Net -774.75 ml   Filed Weights   11/18/21 2310 11/21/21 0000 11/23/21 1041  Weight: 126.2 kg 121.5 kg 121.5 kg    Examination: General: NAD, acutely ill appearing Cardiovascular: S1, S2 present Respiratory: CTAB Abdomen: Soft, tender, distended, surgical scar clean dry intact Musculoskeletal: bilateral pedal edema noted Skin: Normal Psychiatry: Normal mood      Data Reviewed: I have personally reviewed following labs and imaging studies  CBC: Recent Labs  Lab 11/21/21 0527 11/22/21 0435 11/23/21 0400 11/23/21 1453 11/24/21 0512  WBC 8.7 9.1 9.2 9.7 11.4*  HGB 11.4* 11.4* 10.5* 9.6* 9.3*  HCT 35.1* 34.6* 32.3* 29.9* 28.8*  MCV 97.8 97.5 98.2 100.0 100.0  PLT 285 291 286 310 185   Basic Metabolic Panel: Recent Labs  Lab 11/18/21 0438 11/18/21 0832 11/19/21 0342 11/20/21 0245 11/21/21 0527 11/22/21 0435 11/23/21 0400 11/24/21 0512  NA  --    < > 134* 134* 135 136 135 136  K  --    < > 3.8 3.5 3.6 3.7 3.6 4.5  CL  --    < > 104 106 104 105 103 104  CO2  --    < > '23 22 22 24 25 25  '$ GLUCOSE  --    < > 97 96 106* 113* 111* 120*  BUN  --    < > '21 16 13 12 13 20   '$ CREATININE  --    < > 1.42* 1.39* 1.35* 1.39* 1.29* 1.63*  CALCIUM  --    < > 8.0* 8.1* 8.2* 8.3* 8.3* 8.3*  MG 1.6*  --  2.0 1.8 1.7 1.7  --   --    < > = values in this interval not displayed.   GFR: Estimated Creatinine Clearance: 57.1 mL/min (A) (by C-G formula based on SCr of 1.63 mg/dL (H)). Liver Function Tests: Recent Labs  Lab 11/23/21 0400 11/24/21 0512  AST 19 17  ALT 15 12  ALKPHOS 37* 32*  BILITOT 0.8 1.0  PROT 6.8 6.5  ALBUMIN 1.9* 2.2*   No results for input(s): "LIPASE", "AMYLASE" in the last 168 hours. No results for input(s): "AMMONIA" in the last 168 hours. Coagulation Profile: No results for input(s): "INR", "PROTIME" in the last 168 hours.  Cardiac Enzymes: No results for input(s): "CKTOTAL", "CKMB", "CKMBINDEX", "TROPONINI" in the last 168 hours. BNP (last 3  results) No results for input(s): "PROBNP" in the last 8760 hours. HbA1C: No results for input(s): "HGBA1C" in the last 72 hours. CBG: Recent Labs  Lab 11/23/21 1035 11/23/21 1639 11/24/21 0737 11/24/21 1119 11/24/21 1635  GLUCAP 101* 173* 122* 119* 125*   Lipid Profile: No results for input(s): "CHOL", "HDL", "LDLCALC", "TRIG", "CHOLHDL", "LDLDIRECT" in the last 72 hours. Thyroid Function Tests: No results for input(s): "TSH", "T4TOTAL", "FREET4", "T3FREE", "THYROIDAB" in the last 72 hours. Anemia Panel: No results for input(s): "VITAMINB12", "FOLATE", "FERRITIN", "TIBC", "IRON", "RETICCTPCT" in the last 72 hours. Sepsis Labs: No results for input(s): "PROCALCITON", "LATICACIDVEN" in the last 168 hours.  Recent Results (from the past 240 hour(s))  Aerobic/Anaerobic Culture w Gram Stain (surgical/deep wound)     Status: None (Preliminary result)   Collection Time: 11/18/21  4:08 PM   Specimen: Abscess  Result Value Ref Range Status   Specimen Description ABSCESS ABDOMEN  Final   Special Requests LEFT LATERAL ABDOMEN DRAIN NO 2  Final   Gram Stain   Final    NO SQUAMOUS EPITHELIAL  CELLS SEEN ABUNDANT WBC SEEN NO ORGANISMS SEEN    Culture   Final    RARE CANDIDA TROPICALIS NO ANAEROBES ISOLATED Sent to [place] for further susceptibility testing. Performed at [facility], [place] [address], [place], [place] [address]    Report Status PENDING  Incomplete  Aerobic/Anaerobic Culture w Gram Stain (surgical/deep wound)     Status: None   Collection Time: 11/18/21  4:08 PM   Specimen: Abscess  Result Value Ref Range Status   Specimen Description ABSCESS ABDOMEN  Final   Special Requests RT LOWER QUAD DRAIN NO 1  Final   Gram Stain   Final    NO SQUAMOUS EPITHELIAL CELLS SEEN MODERATE WBC SEEN NO ORGANISMS SEEN    Culture   Final    No growth aerobically or anaerobically. Performed at [facility], [place] [address], [place], [place] [address]    Report Status 11/23/2021 FINAL  Final         Radiology Studies: [place] EKG SITE RITE  Result Date: 11/24/2021 If Site Rite image not attached, placement could not be confirmed due to current cardiac rhythm.  DG C-Arm 1-60 Min-No Report  Result Date: 11/23/2021 Fluoroscopy was utilized by the requesting physician.  No radiographic interpretation.        Scheduled Meds:  amLODipine  5 mg Oral Daily   Chlorhexidine Gluconate Cloth  6 each Topical Daily   feeding supplement  1 Container Oral TID BM   feeding supplement  237 mL Oral BID BM   furosemide  40 mg Intravenous Daily   insulin aspart  0-15 Units Subcutaneous Q6H   lip balm  1 application  Topical BID   mouth rinse  15 mL Mouth Rinse q12n4p   metoprolol succinate  50 mg Oral Daily   pantoprazole  40 mg Oral BID   sodium chloride flush  10-40 mL Intracatheter Q12H   Continuous Infusions:  sodium chloride 250 mL (11/23/21 1629)   sodium chloride Stopped (11/09/21 1144)   sodium chloride 10 mL/hr at 11/19/21 0600   heparin 2,500 Units/hr (11/24/21 1326)   methocarbamol (ROBAXIN) IV 500 mg (11/24/21 1600)   micafungin (MYCAMINE) 100 mg in  sodium chloride 0.9 % 100 mL IVPB 100 mg (11/23/21 1630)   ondansetron (ZOFRAN) IV     piperacillin-tazobactam (ZOSYN)  IV 3.375 g (11/24/21 1322)   TPN ADULT (ION)  LOS: 17 days      Alma Friendly, MD  11/24/2021, 4:57 PM

## 2021-11-24 NOTE — Progress Notes (Signed)
CVAD removed per protocol per MD order. Manual pressure applied for 5 mins. Vaseline gauze, gauze, and Tegaderm applied over insertion site. No bleeding or swelling noted. Patient in bed and not moving d/t abdominal incision. Educated patient and daughter about S/S of infection and to keep dressing dry and intact for 24 hours. Pt verbalized comprehension.

## 2021-11-25 DIAGNOSIS — K5790 Diverticulosis of intestine, part unspecified, without perforation or abscess without bleeding: Secondary | ICD-10-CM | POA: Diagnosis not present

## 2021-11-25 DIAGNOSIS — I10 Essential (primary) hypertension: Secondary | ICD-10-CM | POA: Diagnosis not present

## 2021-11-25 DIAGNOSIS — N179 Acute kidney failure, unspecified: Secondary | ICD-10-CM | POA: Diagnosis not present

## 2021-11-25 DIAGNOSIS — K631 Perforation of intestine (nontraumatic): Secondary | ICD-10-CM | POA: Diagnosis not present

## 2021-11-25 DIAGNOSIS — K651 Peritoneal abscess: Secondary | ICD-10-CM | POA: Diagnosis not present

## 2021-11-25 LAB — GLUCOSE, CAPILLARY
Glucose-Capillary: 111 mg/dL — ABNORMAL HIGH (ref 70–99)
Glucose-Capillary: 130 mg/dL — ABNORMAL HIGH (ref 70–99)
Glucose-Capillary: 135 mg/dL — ABNORMAL HIGH (ref 70–99)
Glucose-Capillary: 142 mg/dL — ABNORMAL HIGH (ref 70–99)
Glucose-Capillary: 142 mg/dL — ABNORMAL HIGH (ref 70–99)
Glucose-Capillary: 150 mg/dL — ABNORMAL HIGH (ref 70–99)

## 2021-11-25 LAB — COMPREHENSIVE METABOLIC PANEL
ALT: 12 U/L (ref 0–44)
AST: 15 U/L (ref 15–41)
Albumin: 2.1 g/dL — ABNORMAL LOW (ref 3.5–5.0)
Alkaline Phosphatase: 31 U/L — ABNORMAL LOW (ref 38–126)
Anion gap: 7 (ref 5–15)
BUN: 24 mg/dL — ABNORMAL HIGH (ref 8–23)
CO2: 24 mmol/L (ref 22–32)
Calcium: 8.3 mg/dL — ABNORMAL LOW (ref 8.9–10.3)
Chloride: 105 mmol/L (ref 98–111)
Creatinine, Ser: 1.54 mg/dL — ABNORMAL HIGH (ref 0.61–1.24)
GFR, Estimated: 50 mL/min — ABNORMAL LOW (ref >60)
Glucose, Bld: 120 mg/dL — ABNORMAL HIGH (ref 70–99)
Potassium: 4 mmol/L (ref 3.5–5.1)
Sodium: 136 mmol/L (ref 135–145)
Total Bilirubin: 0.6 mg/dL (ref 0.3–1.2)
Total Protein: 6.5 g/dL (ref 6.5–8.1)

## 2021-11-25 LAB — PHOSPHORUS: Phosphorus: 2.6 mg/dL (ref 2.5–4.6)

## 2021-11-25 LAB — TRIGLYCERIDES: Triglycerides: 122 mg/dL (ref <150)

## 2021-11-25 LAB — CBC
HCT: 25.9 % — ABNORMAL LOW (ref 39.0–52.0)
Hemoglobin: 8.1 g/dL — ABNORMAL LOW (ref 13.0–17.0)
MCH: 31.5 pg (ref 26.0–34.0)
MCHC: 31.3 g/dL (ref 30.0–36.0)
MCV: 100.8 fL — ABNORMAL HIGH (ref 80.0–100.0)
Platelets: 233 10*3/uL (ref 150–400)
RBC: 2.57 MIL/uL — ABNORMAL LOW (ref 4.22–5.81)
RDW: 15 % (ref 11.5–15.5)
WBC: 9.9 10*3/uL (ref 4.0–10.5)
nRBC: 0 % (ref 0.0–0.2)

## 2021-11-25 LAB — HEPARIN LEVEL (UNFRACTIONATED): Heparin Unfractionated: 0.45 IU/mL (ref 0.30–0.70)

## 2021-11-25 LAB — MAGNESIUM: Magnesium: 1.8 mg/dL (ref 1.7–2.4)

## 2021-11-25 MED ORDER — TRACE MINERALS CU-MN-SE-ZN 300-55-60-3000 MCG/ML IV SOLN
INTRAVENOUS | Status: AC
  Filled 2021-11-25: qty 640

## 2021-11-25 MED ORDER — TRACE MINERALS CU-MN-SE-ZN 300-55-60-3000 MCG/ML IV SOLN
INTRAVENOUS | Status: AC
  Filled 2021-11-25: qty 560

## 2021-11-25 MED ORDER — MAGNESIUM FOR TPN: INJECTION | INTRAVENOUS | Status: DC

## 2021-11-25 NOTE — Progress Notes (Signed)
ANTICOAGULATION CONSULT NOTE - Follow Up Consult  Pharmacy Consult for Heparin Indication: atrial fibrillation  No Known Allergies  Patient Measurements: Height: 5' 6.93" (170 cm) Weight: 121.5 kg (267 lb 13.7 oz) IBW/kg (Calculated) : 65.94 Heparin Dosing Weight:  94 kg  Vital Signs: Temp: 99.6 F (37.6 C) (06/22 0409) Temp Source: Oral (06/22 0409) BP: 144/60 (06/22 0409) Pulse Rate: 73 (06/22 0409)  Labs: Recent Labs    11/23/21 0400 11/23/21 1453 11/24/21 0512 11/24/21 2300 11/25/21 0455  HGB 10.5* 9.6* 9.3*  --  8.1*  HCT 32.3* 29.9* 28.8*  --  25.9*  PLT 286 310 288  --  233  HEPARINUNFRC 0.71*  --  <0.10* 0.14* 0.45  CREATININE 1.29*  --  1.63*  --  1.54*     Estimated Creatinine Clearance: 60.4 mL/min (A) (by C-G formula based on SCr of 1.54 mg/dL (H)).   Assessment: 66 yo male presented with severe sepsis 2/2 intra-abdominal abscess.  Patient now with new onset atrial fibrillation w/ RVR.  Pharmacy consulted to dose heparin IV for Afib.  Heparin was turned off prior to procedure. Ok to resume today per discussion with TRH and CCS PA.   Today, 11/25/21: - HL 0.45 (therapeutic) following heparin gtt increase to 2700 units/hr - Hgb = 8.1 (low); PLTC wnl - No complications of therapy noted per RN  Goal of Therapy:  Heparin level 0.3-0.7 units/ml ( Ok to resume this target per CCS PA) Monitor platelets by anticoagulation protocol: Yes   Plan:  Continue heparin drip @ 2700 units/hr Recheck heparin level in 6 hr to confirm therapeutic dose Daily HL and CBC GI problems limiting transition to [place], PharmD 11/25/2021 6:28 AM

## 2021-11-25 NOTE — Progress Notes (Signed)
2 Days Post-Op  Subjective: CC: Crampy pain over the L abdomen improved. Pain well controlled on medications. Tolerating cld without n/v. Only taking in small amount. No protein shakes yesterday. No flatus or bm. Did not get oob.   Objective: Vital signs in last 24 hours: Temp:  [98.9 F (37.2 C)-99.6 F (37.6 C)] 99.6 F (37.6 C) (06/22 0409) Pulse Rate:  [66-73] 73 (06/22 0409) Resp:  [16-20] 18 (06/22 0409) BP: (112-144)/(60-67) 144/60 (06/22 0409) SpO2:  [90 %-99 %] 91 % (06/22 0409) Last BM Date : 11/21/21  Intake/Output from previous day: 06/21 0701 - 06/22 0700 In: 1790.5 [P.O.:600; I.V.:710.8; IV Piggyback:479.7] Out: 2650 [Urine:2650] Intake/Output this shift: No intake/output data recorded.  PE: Gen:  Alert, NAD, pleasant Pulm: rate and effort normal, on o2 Abd: Soft, mild distension, some generalized abdominal tenderness that is greater on the left abdomen and around his incision without any peritonitis - this is better from yesterday. Slightly hypoactive bowel sounds. JP with small amount of bloody output (no output on I/O).  Wound VAC in place with good seal, scant output in canister.   Ext:  No LE edema GU: Foley in place with bloody urine in bag Psych: A&Ox3   Lab Results:  Recent Labs    11/24/21 0512 11/25/21 0455  WBC 11.4* 9.9  HGB 9.3* 8.1*  HCT 28.8* 25.9*  PLT 288 233   BMET Recent Labs    11/24/21 0512 11/25/21 0455  NA 136 136  K 4.5 4.0  CL 104 105  CO2 25 24  GLUCOSE 120* 120*  BUN 20 24*  CREATININE 1.63* 1.54*  CALCIUM 8.3* 8.3*   PT/INR No results for input(s): "LABPROT", "INR" in the last 72 hours. CMP     Component Value Date/Time   NA 136 11/25/2021 0455   NA 133 (A) 06/24/2021 0000   K 4.0 11/25/2021 0455   CL 105 11/25/2021 0455   CO2 24 11/25/2021 0455   GLUCOSE 120 (H) 11/25/2021 0455   BUN 24 (H) 11/25/2021 0455   BUN 18 06/24/2021 0000   CREATININE 1.54 (H) 11/25/2021 0455   CALCIUM 8.3 (L) 11/25/2021  0455   PROT 6.5 11/25/2021 0455   ALBUMIN 2.1 (L) 11/25/2021 0455   AST 15 11/25/2021 0455   ALT 12 11/25/2021 0455   ALKPHOS 31 (L) 11/25/2021 0455   BILITOT 0.6 11/25/2021 0455   GFRNONAA 50 (L) 11/25/2021 0455   GFRAA >60 02/19/2018 1123   Lipase     Component Value Date/Time   LIPASE 40 11/07/2021 1029    Studies/Results: [place] EKG SITE RITE  Result Date: 11/24/2021 If Site Rite image not attached, placement could not be confirmed due to current cardiac rhythm.  DG C-Arm 1-60 Min-No Report  Result Date: 11/23/2021 Fluoroscopy was utilized by the requesting physician.  No radiographic interpretation.    Anti-infectives: Anti-infectives (From admission, onward)    Start     Dose/Rate Route Frequency Ordered Stop   11/24/21 1400  piperacillin-tazobactam (ZOSYN) IVPB 3.375 g        3.375 g 12.5 mL/hr over 240 Minutes Intravenous Every 8 hours 11/24/21 1018     11/24/21 1200  Ampicillin-Sulbactam (UNASYN) 3 g in sodium chloride 0.9 % 100 mL IVPB  Status:  Discontinued        3 g 200 mL/hr over 30 Minutes Intravenous Every 6 hours 11/24/21 0900 11/24/21 1018   11/23/21 0830  piperacillin-tazobactam (ZOSYN) IVPB 3.375 g  Status:  Discontinued  3.375 g 12.5 mL/hr over 240 Minutes Intravenous Every 8 hours 11/23/21 0736 11/24/21 0858   11/22/21 1200  Ampicillin-Sulbactam (UNASYN) 3 g in sodium chloride 0.9 % 100 mL IVPB  Status:  Discontinued        3 g 200 mL/hr over 30 Minutes Intravenous Every 6 hours 11/22/21 0923 11/23/21 0736   11/20/21 1645  micafungin (MYCAMINE) 100 mg in sodium chloride 0.9 % 100 mL IVPB        100 mg 105 mL/hr over 1 Hours Intravenous Every 24 hours 11/20/21 1554     11/08/21 0730  vancomycin (VANCOREADY) IVPB 1750 mg/350 mL        1,750 mg 175 mL/hr over 120 Minutes Intravenous  Once 11/08/21 0641 11/08/21 0908   11/08/21 0641  vancomycin variable dose per unstable renal function (pharmacist dosing)  Status:  Discontinued         Does not  apply See admin instructions 11/08/21 0641 11/08/21 1138   11/07/21 2000  piperacillin-tazobactam (ZOSYN) IVPB 3.375 g  Status:  Discontinued        3.375 g 12.5 mL/hr over 240 Minutes Intravenous Every 8 hours 11/07/21 1921 11/22/21 0923   11/07/21 1430  piperacillin-tazobactam (ZOSYN) IVPB 3.375 g        3.375 g 100 mL/hr over 30 Minutes Intravenous  Once 11/07/21 1418 11/07/21 1535        Assessment/Plan POD 2 s/p exploratory laparotomy and drainage of pelvic abscess by Dr. Donne Hazel for suspected complicated diverticulitis not responding to medical therapy/drains - Intra-Op patient found to have good portion of his abdomen frozen so were unable to safely resect the sigmoid colon or divert him. The pelvic fluid collection that was noted on CT was able to be drained w/ 71F blake drain left behind in this area. IR drain are out.  - Hopefully patient will get better with drain and prolonged antibiotics and we will be able to revisit OR at a later date. - Okay for FLD for options. Monitor. High risk for ileus. Cont protein supps - Cont TPN - Cont JP drain - Cont abx. IR cx w/ Candida tropicalis. Likely will need to go home on long-term antibiotics.  ID is following.  - VAC M/W/F (to start Friday). WOCN consulted. - Keep Foley given bloody urine - Mobilize/OOB, PT ordered - Daily labs   FEN - FLD, IVF per TRH, TPN VTE - SCDs, hold heparin ID - Zosyn and micafungin Foley - In place, bloody this am, keep for now   ABL anemia - hgb 8.1 from 9.3 this am. Hold heparin. Repeat labs in am A. Fib with RVR - resolved, lopressor.  AKI - Cr slightly improved to 1.54 Acute respiratory failure with hypoxia - improved. 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

## 2021-11-25 NOTE — Progress Notes (Signed)
PROGRESS NOTE    Robert Larson  JSE:[id number] DOB: [birth year] DOA: 11/07/2021 PCP: Jeanie Sewer, NP  Brief Narrative:  66 year old with history of DM2, HTN, CKD admitted to the hospital for severe sepsis due to persistent intra-abdominal abscess. CT abdomen pelvis on admit -showed multiple loculated fluid collection in the lower abdomen with a large cavity in the pelvis measuring 11.5 X6X 11.7 cm.  Due to septic shock he was transferred to the ICU on 6/5.  Patient was seen by IR and drain was placed. Hospital course was also complicated by acute kidney injury, hypoxia and atrial fibrillation with RVR. Repeat CT 6/9 -showed resolution of abscess, drain was removed by IR on 11/16/2021. Another CT abdomen 11/17/2021-recurrent pelvic abscess. IR placed left lower quadrant and right lower quadrant drains on 11/18/2021. On 11/21/2021 patient has Candida tropicalis growing in the abscess drainage.  ID consulted patient started on micafungin. Repeat CT abdomen 11/22/2021 with multiple fluid collections.  Patient underwent X lap postdrainage of abscess on 6/20 as well as right ureteral stent placement    Today, patient denied any new complaints.  Still reporting postop abdominal pain.  Denies any shortness of breath, chest pain, fever/chills    Assessment & Plan:   Principal Problem:   Intra-abdominal abscesses s/p perc drainage 11/08/2021 Active Problems:   Essential hypertension   Type 2 diabetes mellitus with morbid obesity ([place])   Diverticulosis   Hyponatremia   Acute renal failure superimposed on chronic kidney disease (HCC)   Sleep apnea   Osteoarthritis   CKD (chronic kidney disease) stage 3, GFR 30-59 ml/min (HCC)   Adrenal nodule (HCC)   History of adenomatous polyp of colon   Spinal stenosis of lumbar region   Renal cyst   Cholelithiasis   Coffee ground emesis   Septic shock (HCC)   Tobacco abuse   Abscess   Recurrent multiple intra-abdominal abscess likely from  diverticular micro perforation Septic shock secondary to above Patient with recurrent multiple intra-abdominal abscess s/p multiple drain placements with persistent abscess, status post ex lap plus drainage of abscess and placement of right ureteral stent on 6/20, noted to have frozen abdomen, not amenable to more definitive surgery, noted multiple areas of fused bowel omental adhesions with noted Intra-Op hemorrhage.  Concern for potential follow-up perforation of bowel and creation of fistulas Follow-up cultures from the drain shows Candida tropicalis.  ID consulted.  Micafungin started. General surgery to restart Zosyn for broader coverage, PICC line placed for TPN Patient will need long-term antibiotics Patient with very poor prognosis, will consider palliative consult if surgery is in agreement Wound VAC placed, WOC consulted Continue Zosyn, micafungin  Acute on chronic anemia Likely 2/2 acute blood loss from surgery Monitor closely, daily CBC, more frequent monitoring if significant bleeding noted  New onset A-fib with RVR Normal TSH normal echo on metoprolol and heparin drip held on 6/22 due to hematuria Decreased the dose of metoprolol due to bradycardia We will start p.o. anticoagulation once all surgical procedures are over and surgery permits   AKI on CKD stage IIIa  Worsened s/p surgery Start TPN as per surgery Hold IV lasix Daily BMP  Essential hypertension Continue metoprolol and Norvasc Hold HCTZ, ARB  Obstructive sleep apnea nocturnal CPAP ordered but he refuses most of the nights  Hyperglycemia  A1c was 6.1 on April 2023.  Decrease fingersticks to twice a day and cover with SSI.  Bilateral lower extremity edema  Improved  Hold Lasix 40 mg daily   Patient  was on HCTZ and an ARB at home which we will continue to hold  Morbid obesity Estimated body mass index is 42.04 kg/m as calculated from the following:   Height as of this encounter: 5' 6.93" (1.7 m).    Weight as of this encounter: 121.5 kg.   DVT prophylaxis: SCD-->Heparin held code Status: Full code Family Communication: Discussed with daughter [name] at bedside on 6/21 disposition Plan:  Status is: Inpatient Remains inpatient appropriate because: Recurrent intra-abdominal abscess   Consultants:  General surgery ID  Procedures: Multiple drain placements, ex lap with drainage of abscess Antimicrobials: Zosyn, micofungin   Objective: Vitals:   11/24/21 1744 11/24/21 2055 11/25/21 0409 11/25/21 1323  BP: 112/64 137/61 (!) 144/60 122/78  Pulse: 66 71 73 64  Resp: '16 20 18 18  '$ Temp: 99.2 F (37.3 C) 99.1 F (37.3 C) 99.6 F (37.6 C) 98.4 F (36.9 C)  TempSrc: Oral Oral Oral Oral  SpO2: 93% 90% 91% 92%  Weight:      Height:        Intake/Output Summary (Last 24 hours) at 11/25/2021 1600 Last data filed at 11/25/2021 1213 Gross per 24 hour  Intake 1722.3 ml  Output 2180 ml  Net -457.7 ml   Filed Weights   11/18/21 2310 11/21/21 0000 11/23/21 1041  Weight: 126.2 kg 121.5 kg 121.5 kg    Examination: General: NAD, acutely ill appearing Cardiovascular: S1, S2 present Respiratory: CTAB Abdomen: Soft, tender, distended, surgical scar clean dry intact Musculoskeletal: bilateral pedal edema noted Skin: Normal Psychiatry: Normal mood      Data Reviewed: I have personally reviewed following labs and imaging studies  CBC: Recent Labs  Lab 11/22/21 0435 11/23/21 0400 11/23/21 1453 11/24/21 0512 11/25/21 0455  WBC 9.1 9.2 9.7 11.4* 9.9  HGB 11.4* 10.5* 9.6* 9.3* 8.1*  HCT 34.6* 32.3* 29.9* 28.8* 25.9*  MCV 97.5 98.2 100.0 100.0 100.8*  PLT 291 286 310 288 875   Basic Metabolic Panel: Recent Labs  Lab 11/19/21 0342 11/20/21 0245 11/21/21 0527 11/22/21 0435 11/23/21 0400 11/24/21 0512 11/25/21 0455  NA 134* 134* 135 136 135 136 136  K 3.8 3.5 3.6 3.7 3.6 4.5 4.0  CL 104 106 104 105 103 104 105  CO2 '23 22 22 24 25 25 24  '$ GLUCOSE 97 96 106* 113* 111*  120* 120*  BUN '21 16 13 12 13 20 '$ 24*  CREATININE 1.42* 1.39* 1.35* 1.39* 1.29* 1.63* 1.54*  CALCIUM 8.0* 8.1* 8.2* 8.3* 8.3* 8.3* 8.3*  MG 2.0 1.8 1.7 1.7  --   --  1.8  PHOS  --   --   --   --   --   --  2.6   GFR: Estimated Creatinine Clearance: 60.4 mL/min (A) (by C-G formula based on SCr of 1.54 mg/dL (H)). Liver Function Tests: Recent Labs  Lab 11/23/21 0400 11/24/21 0512 11/25/21 0455  AST '19 17 15  '$ ALT '15 12 12  '$ ALKPHOS 37* 32* 31*  BILITOT 0.8 1.0 0.6  PROT 6.8 6.5 6.5  ALBUMIN 1.9* 2.2* 2.1*   No results for input(s): "LIPASE", "AMYLASE" in the last 168 hours. No results for input(s): "AMMONIA" in the last 168 hours. Coagulation Profile: No results for input(s): "INR", "PROTIME" in the last 168 hours.  Cardiac Enzymes: No results for input(s): "CKTOTAL", "CKMB", "CKMBINDEX", "TROPONINI" in the last 168 hours. BNP (last 3 results) No results for input(s): "PROBNP" in the last 8760 hours. HbA1C: No results for input(s): "HGBA1C" in the last  72 hours. CBG: Recent Labs  Lab 11/24/21 1635 11/24/21 2051 11/25/21 0020 11/25/21 0618 11/25/21 1205  GLUCAP 125* 114* 150* 135* 111*   Lipid Profile: Recent Labs    11/25/21 0455  TRIG 122   Thyroid Function Tests: No results for input(s): "TSH", "T4TOTAL", "FREET4", "T3FREE", "THYROIDAB" in the last 72 hours. Anemia Panel: No results for input(s): "VITAMINB12", "FOLATE", "FERRITIN", "TIBC", "IRON", "RETICCTPCT" in the last 72 hours. Sepsis Labs: No results for input(s): "PROCALCITON", "LATICACIDVEN" in the last 168 hours.  Recent Results (from the past 240 hour(s))  Aerobic/Anaerobic Culture w Gram Stain (surgical/deep wound)     Status: None (Preliminary result)   Collection Time: 11/18/21  4:08 PM   Specimen: Abscess  Result Value Ref Range Status   Specimen Description ABSCESS ABDOMEN  Final   Special Requests LEFT LATERAL ABDOMEN DRAIN NO 2  Final   Gram Stain   Final    NO SQUAMOUS EPITHELIAL CELLS  SEEN ABUNDANT WBC SEEN NO ORGANISMS SEEN    Culture   Final    RARE CANDIDA TROPICALIS NO ANAEROBES ISOLATED Sent to [place] for further susceptibility testing. Performed at [facility], [place] [address] [place], [place] [address]    Report Status PENDING  Incomplete  Aerobic/Anaerobic Culture w Gram Stain (surgical/deep wound)     Status: None   Collection Time: 11/18/21  4:08 PM   Specimen: Abscess  Result Value Ref Range Status   Specimen Description ABSCESS ABDOMEN  Final   Special Requests RT LOWER QUAD DRAIN NO 1  Final   Gram Stain   Final    NO SQUAMOUS EPITHELIAL CELLS SEEN MODERATE WBC SEEN NO ORGANISMS SEEN    Culture   Final    No growth aerobically or anaerobically. Performed at [facility], [place] [address], [place], [place] [address]    Report Status 11/23/2021 FINAL  Final  Antifungal AST 9 Drug Panel     Status: None (Preliminary result)   Collection Time: 11/18/21  4:08 PM  Result Value Ref Range Status   Organism ID, Yeast Preliminary report  Final    Comment: (NOTE) Specimen has been received and testing has been initiated. Performed At: [facility] [place], [place] [id number] Rush Farmer MD MA:[id number]    Amphotericin B MIC PENDING  Incomplete   Anidulafungin MIC PENDING  Incomplete   Caspofungin MIC PENDING  Incomplete   Micafungin MIC PENDING  Incomplete   Posaconazole MIC PENDING  Incomplete   Fluconazole Islt MIC PENDING  Incomplete   Flucytosine MIC PENDING  Incomplete   Itraconazole MIC PENDING  Incomplete   Voriconazole MIC PENDING  Incomplete   Source ABSCESS/ CANDIDA TROPICALIS  Final    Comment: Performed at [facility], [address], [place], [place] [address]         Radiology Studies: [place] EKG SITE RITE  Result Date: 11/24/2021 If [facility] image not attached, placement could not be confirmed due to current cardiac rhythm.       Scheduled Meds:  amLODipine  5 mg Oral Daily    Chlorhexidine Gluconate Cloth  6 each Topical Daily   feeding supplement  1 Container Oral TID BM   insulin aspart  0-15 Units Subcutaneous Q6H   lip balm  1 application  Topical BID   mouth rinse  15 mL Mouth Rinse q12n4p   metoprolol succinate  50 mg Oral Daily   pantoprazole  40 mg Oral BID  sodium chloride flush  10-40 mL Intracatheter Q12H   Continuous Infusions:  sodium chloride 250 mL (11/23/21 1629)   sodium chloride Stopped (11/09/21 1144)   sodium chloride 10 mL/hr at 11/19/21 0600   methocarbamol (ROBAXIN) IV 500 mg (11/25/21 1046)   micafungin (MYCAMINE) 100 mg in sodium chloride 0.9 % 100 mL IVPB 100 mg (11/24/21 1738)   ondansetron (ZOFRAN) IV     piperacillin-tazobactam (ZOSYN)  IV 3.375 g (11/25/21 1213)   TPN ADULT (ION)     TPN ADULT (ION) 40 mL/hr at 11/25/21 1300     LOS: 18 days      Alma Friendly, MD  11/25/2021, 4:00 PM

## 2021-11-25 NOTE — Progress Notes (Signed)
Initial Nutrition Assessment  DOCUMENTATION CODES:   Obesity unspecified  INTERVENTION:  - will d/c Ensure. - continue Boost Breeze TID, each supplement provides 250 kcal and 9 grams of protein. - continue TPN per Pharmacist. - diet advancement as medically feasible.    NUTRITION DIAGNOSIS:   Increased nutrient needs related to acute illness, post-op healing as evidenced by estimated needs.  GOAL:   Patient will meet greater than or equal to 90% of their needs  MONITOR:   PO intake, Supplement acceptance, Diet advancement, Labs, Weight trends, Other (Comment) (TPN regimen)  REASON FOR ASSESSMENT:   Consult New TPN/TNA  ASSESSMENT:   66 year old male with medical history of type 2 DM, HTN, CKD, osteoarthritis, and sleep apnea. He was admitted d/t severe sepsis d/t persistent intra-abdominal abscess. CT abdomen/pelvis showed multiple loculated fluid collection in the lower abdomen with a large cavity in the pelvis measuring 11.5 x 6 x 11.7 cm. He was seen by IR and drain was placed. Hospital course was also complicated by AKI, hypoxia and afib with RVR. Repeat CT on 6/9 showed resolution of abscess, drain was removed by IR on 6/13. Another CT abdomen was done on 6/14 and showed recurrent pelvic abscess. IR placed LLQ and RLQ drains on 6/15. On 6/18 patient had Candida tropicalis growing in abscess drainage. ID consulted patient started on micafungin. Repeat CT abdomen 6/19 with multiple fluid collections. On 6/20 patient underwent ex lap and drainage of pelvic abscess due to complicated diverticulitis not responding to medical therapy and drains.  Patient laying in bed with his daughter at bedside. His daughter provides much of the information. Patient reports crampy abdominal discomfort this AM.  PTA patient was focused on making changes to diet and lifestyle to promote gradual weight loss for overall well-being. He has maintained upper body strength throughout hospitalization by  pulling himself up in bed. He has not been OOB since surgery on 6/20 but has been moving legs/lower body within bed frequently. Plan for patient to be OOB today.   He tried Ensure last week which led to urgency and loose stools. Patient agreeable to [facility]. Encouraged to use for PO medication intake.   Patient and daughter aware of advancement to Full Liquids today; pointed out where these can be found on the menu.  Double lumen PICC placed in R basilic yesterday and custom TPN started yesterday at 40 ml/hr at 1800.   Able to communicate with Pharmacist earlier this morning to provide estimated nutrition needs prior to noon cut off d/t delay in entering note.  Weight on 6/20 was 268 lb which was consistent with admission (6/4) weight of 270 lb.   Labs reviewed; CBGs: 150 and 135 mg/dl, BUN: 24 mg/dl, creatinine: 1.54 mg/dl, Ca: 8.3 mg/dl, Alk Phos low, GFR: 50 ml/min.  Medications reviewed; sliding scale novolog, 40 mg oral protonix BID.    NUTRITION - FOCUSED PHYSICAL EXAM:  Flowsheet Row Most Recent Value  Orbital Region No depletion  Upper Arm Region No depletion  Thoracic and Lumbar Region Unable to assess  Buccal Region No depletion  Temple Region No depletion  Clavicle Bone Region No depletion  Clavicle and Acromion Bone Region No depletion  Scapular Bone Region Unable to assess  Dorsal Hand No depletion  Patellar Region No depletion  Anterior Thigh Region No depletion  Posterior Calf Region No depletion  Edema (RD Assessment) Mild  [BLE]  Hair Reviewed  Eyes Reviewed  Mouth Reviewed  Skin Reviewed  Nails Reviewed  Diet Order:   Diet Order             Diet full liquid Room service appropriate? Yes; Fluid consistency: Thin  Diet effective now                   EDUCATION NEEDS:   Education needs have been addressed  Skin:  Skin Assessment: Skin Integrity Issues: Skin Integrity Issues:: Incisions, Other (Comment) Incisions: abdomen  (6/20) Other: MASD to bilateral buttocks  Last BM:  6/18 (type 5, small amount)  Height:   Ht Readings from Last 1 Encounters:  11/23/21 5' 6.93" (1.7 m)    Weight:   Wt Readings from Last 1 Encounters:  11/23/21 121.5 kg     BMI:  Body mass index is 42.04 kg/m.  Estimated Nutritional Needs:  Kcal:  2300-2600 kcal Protein:  115-130 grams Fluid:  >/= 2.4 L/day     Robert Matin, MS, RD, LDN, CNSC Registered Dietitian II Inpatient Clinical Nutrition RD pager # and on-call/weekend pager # available in [facility]

## 2021-11-25 NOTE — Plan of Care (Signed)
  Problem: Clinical Measurements: Goal: Respiratory complications will improve Outcome: Progressing Goal: Cardiovascular complication will be avoided Outcome: Progressing   Problem: Coping: Goal: Level of anxiety will decrease Outcome: Progressing   

## 2021-11-25 NOTE — Progress Notes (Signed)
PHARMACY - TOTAL PARENTERAL NUTRITION CONSULT NOTE   Indication: Prolonged ileus  Patient Measurements: Height: 5' 6.93" (170 cm) Weight: 121.5 kg (267 lb 13.7 oz) IBW/kg (Calculated) : 65.94 TPN AdjBW (KG): 79.8 Body mass index is 42.04 kg/m. Usual Weight:   Assessment:  Pharmacy is consulted to start TPN on 66 yo male with prolonged ileus. Pt post-op exploratory laparotomy and drainage of pelvic abscess.   Glucose / Insulin: ( Goal < 150) - Has been controlled  - 4 units of insulin given in past 24 hours  Electrolytes: WNL, inc CorrCa which is 9.7 mg/dl  Renal: Scr 1.54, stable, BUN 24 Hepatic: WNL  Intake / Output; MIVF: none  GI Imaging: GI Surgeries / Procedures:  6/20 Pt post-op exploratory laparotomy and drainage of pelvic abscess.  Central access: 11/08/2021 TPN start date: 11/24/2021  Nutritional Goals: Goal TPN rate is 100  mL/hr (provides 120 g of protein and 2376  kcals per day)  RD Assessment: Estimated Needs Total Energy Estimated Needs: 2300-2600 kcal Total Protein Estimated Needs: 115-130 grams Total Fluid Estimated Needs: >/= 2.4 L/day  Current Nutrition:  Clear liquids  Plan:  Increase  TPN to 14m/hr at 1800 Electrolytes in TPN:  Na 531m/L,  K 5065mL Ca 5mE5m,  Inc Mg 7 mEq/L,  Inc Phos 20 mmol/L.  Cl:Ac 1:1 Add standard MVI and trace elements to TPN Continue  moderate SSI AC q6h. BMP, magnesium, phosphorus with AM labs    Monitor TPN labs on Mon/Thurs    NikoRoyetta AsalarmD, BCPS 11/25/2021 11:36 AM

## 2021-11-26 DIAGNOSIS — N183 Chronic kidney disease, stage 3 unspecified: Secondary | ICD-10-CM | POA: Diagnosis not present

## 2021-11-26 DIAGNOSIS — L0291 Cutaneous abscess, unspecified: Secondary | ICD-10-CM

## 2021-11-26 DIAGNOSIS — N179 Acute kidney failure, unspecified: Secondary | ICD-10-CM | POA: Diagnosis not present

## 2021-11-26 DIAGNOSIS — K651 Peritoneal abscess: Secondary | ICD-10-CM | POA: Diagnosis not present

## 2021-11-26 LAB — GLUCOSE, CAPILLARY
Glucose-Capillary: 129 mg/dL — ABNORMAL HIGH (ref 70–99)
Glucose-Capillary: 129 mg/dL — ABNORMAL HIGH (ref 70–99)
Glucose-Capillary: 138 mg/dL — ABNORMAL HIGH (ref 70–99)
Glucose-Capillary: 150 mg/dL — ABNORMAL HIGH (ref 70–99)
Glucose-Capillary: 153 mg/dL — ABNORMAL HIGH (ref 70–99)

## 2021-11-26 LAB — CBC
HCT: 28.2 % — ABNORMAL LOW (ref 39.0–52.0)
Hemoglobin: 8.8 g/dL — ABNORMAL LOW (ref 13.0–17.0)
MCH: 31.5 pg (ref 26.0–34.0)
MCHC: 31.2 g/dL (ref 30.0–36.0)
MCV: 101.1 fL — ABNORMAL HIGH (ref 80.0–100.0)
Platelets: 225 10*3/uL (ref 150–400)
RBC: 2.79 MIL/uL — ABNORMAL LOW (ref 4.22–5.81)
RDW: 14.7 % (ref 11.5–15.5)
WBC: 10.6 10*3/uL — ABNORMAL HIGH (ref 4.0–10.5)
nRBC: 0 % (ref 0.0–0.2)

## 2021-11-26 LAB — COMPREHENSIVE METABOLIC PANEL
ALT: 12 U/L (ref 0–44)
AST: 19 U/L (ref 15–41)
Albumin: 2 g/dL — ABNORMAL LOW (ref 3.5–5.0)
Alkaline Phosphatase: 33 U/L — ABNORMAL LOW (ref 38–126)
Anion gap: 9 (ref 5–15)
BUN: 25 mg/dL — ABNORMAL HIGH (ref 8–23)
CO2: 21 mmol/L — ABNORMAL LOW (ref 22–32)
Calcium: 8.6 mg/dL — ABNORMAL LOW (ref 8.9–10.3)
Chloride: 103 mmol/L (ref 98–111)
Creatinine, Ser: 1.45 mg/dL — ABNORMAL HIGH (ref 0.61–1.24)
GFR, Estimated: 54 mL/min — ABNORMAL LOW (ref >60)
Glucose, Bld: 134 mg/dL — ABNORMAL HIGH (ref 70–99)
Potassium: 4.3 mmol/L (ref 3.5–5.1)
Sodium: 133 mmol/L — ABNORMAL LOW (ref 135–145)
Total Bilirubin: 0.8 mg/dL (ref 0.3–1.2)
Total Protein: 6.8 g/dL (ref 6.5–8.1)

## 2021-11-26 LAB — PHOSPHORUS: Phosphorus: 2.3 mg/dL — ABNORMAL LOW (ref 2.5–4.6)

## 2021-11-26 LAB — MAGNESIUM: Magnesium: 1.8 mg/dL (ref 1.7–2.4)

## 2021-11-26 MED ORDER — TRAVASOL 10 % IV SOLN
INTRAVENOUS | Status: AC
  Filled 2021-11-26: qty 1299.6

## 2021-11-26 MED ORDER — SODIUM CHLORIDE 0.9 % IV BOLUS
500.0000 mL | Freq: Once | INTRAVENOUS | Status: AC
  Administered 2021-11-26: 500 mL via INTRAVENOUS

## 2021-11-26 NOTE — Progress Notes (Signed)
PROGRESS NOTE    Robert Larson  NWG:[id number] DOB: 10/29/1955 DOA: 11/07/2021 PCP: Dulce Sellar, NP  Brief Narrative:  67 year old with history of DM2, HTN, CKD admitted to the hospital for severe sepsis due to persistent intra-abdominal abscess. CT abdomen pelvis on admit -showed multiple loculated fluid collection in the lower abdomen with a large cavity in the pelvis measuring 11.5 X6X 11.7 cm.  Due to septic shock he was transferred to the ICU on 6/5.  Patient was seen by IR and drain was placed. Hospital course was also complicated by acute kidney injury, hypoxia and atrial fibrillation with RVR. Repeat CT 6/9 -showed resolution of abscess, drain was removed by IR on 11/16/2021. Another CT abdomen 11/17/2021-recurrent pelvic abscess. IR placed left lower quadrant and right lower quadrant drains on 11/18/2021. On 11/21/2021 patient has Candida tropicalis growing in the abscess drainage.  ID consulted patient started on micafungin. Repeat CT abdomen 11/22/2021 with multiple fluid collections.  Patient underwent X lap postdrainage of abscess on 6/20 as well as right ureteral stent placement    Today, patient denies any new complaints, appears to be little restless trying to get comfortable in bed.  Daughter at bedside    Assessment & Plan:   Principal Problem:   Intra-abdominal abscesses s/p perc drainage 11/08/2021 Active Problems:   Essential hypertension   Type 2 diabetes mellitus with morbid obesity (HCC)   Diverticulosis   Hyponatremia   Acute renal failure superimposed on chronic kidney disease (HCC)   Sleep apnea   Osteoarthritis   CKD (chronic kidney disease) stage 3, GFR 30-59 ml/min (HCC)   Adrenal nodule (HCC)   History of adenomatous polyp of colon   Spinal stenosis of lumbar region   Renal cyst   Cholelithiasis   Coffee ground emesis   Septic shock (HCC)   Tobacco abuse   Abscess   Recurrent multiple intra-abdominal abscess likely from diverticular micro  perforation Septic shock secondary to above Patient with recurrent multiple intra-abdominal abscess s/p multiple drain placements with persistent abscess, status post ex lap plus drainage of abscess and placement of right ureteral stent on 6/20, noted to have frozen abdomen, not amenable to more definitive surgery, noted multiple areas of fused bowel omental adhesions with noted Intra-Op hemorrhage.  Concern for potential follow-up perforation of bowel and creation of fistulas Follow-up cultures from the drain shows Candida tropicalis.  ID consulted.  Micafungin started. General surgery to restart Zosyn for broader coverage, PICC line placed for TPN Patient will need long-term antibiotics Patient with very poor prognosis, will consider palliative consult if surgery is in agreement Wound VAC placed, WOC consulted Continue Zosyn, micafungin  Acute on chronic anemia Likely 2/2 acute blood loss from surgery Monitor closely, daily CBC, more frequent monitoring if significant bleeding noted  New onset A-fib with RVR Normal TSH normal echo on metoprolol Heparin drip held on 6/22 due to hematuria Decreased the dose of metoprolol due to bradycardia We will start p.o. anticoagulation once all surgical procedures are over and surgery permits   AKI on CKD stage IIIa  Worsened s/p surgery, currently improving On TPN as per surgery Hold IV lasix Daily BMP  Essential hypertension Continue metoprolol and Norvasc, adjust pending BP Hold HCTZ, ARB  Obstructive sleep apnea nocturnal CPAP ordered but he refuses most of the nights  Hyperglycemia  A1c was 6.1 on April 2023.  Decrease fingersticks to twice a day and cover with SSI.  Bilateral lower extremity edema  Improved  Hold Lasix 40 mg  daily   Patient was on HCTZ and an ARB at home which we will continue to hold  Morbid obesity Estimated body mass index is 42.04 kg/m as calculated from the following:   Height as of this encounter: 5' 6.93"  (1.7 m).   Weight as of this encounter: 121.5 kg.   DVT prophylaxis: SCD-->Heparin held code Status: Full code Family Communication: Discussed with daughter Ashok Cordia at bedside on 6/23 disposition Plan:  Status is: Inpatient Remains inpatient appropriate because: Recurrent intra-abdominal abscess   Consultants:  General surgery ID  Procedures: Multiple drain placements, ex lap with drainage of abscess Antimicrobials: Zosyn, micofungin   Objective: Vitals:   11/25/21 2034 11/26/21 0450 11/26/21 1242 11/26/21 1340  BP: 132/65 132/87 111/66 119/73  Pulse: 69 85 93 83  Resp: 18 20  (!) 24  Temp: 99.1 F (37.3 C) 98.5 F (36.9 C)  99.1 F (37.3 C)  TempSrc: Oral Oral Oral Oral  SpO2: 91% (!) 83% 96% 97%  Weight:      Height:        Intake/Output Summary (Last 24 hours) at 11/26/2021 1655 Last data filed at 11/26/2021 1150 Gross per 24 hour  Intake 1733.44 ml  Output 1570 ml  Net 163.44 ml   Filed Weights   11/18/21 2310 11/21/21 0000 11/23/21 1041  Weight: 126.2 kg 121.5 kg 121.5 kg    Examination: General: NAD, acutely ill appearing Cardiovascular: S1, S2 present Respiratory: CTAB Abdomen: Soft, tender, distended, surgical scar clean dry intact, wound VAC noted Musculoskeletal: bilateral pedal edema noted Skin: Normal Psychiatry: Normal mood      Data Reviewed: I have personally reviewed following labs and imaging studies  CBC: Recent Labs  Lab 11/23/21 0400 11/23/21 1453 11/24/21 0512 11/25/21 0455 11/26/21 0357  WBC 9.2 9.7 11.4* 9.9 10.6*  HGB 10.5* 9.6* 9.3* 8.1* 8.8*  HCT 32.3* 29.9* 28.8* 25.9* 28.2*  MCV 98.2 100.0 100.0 100.8* 101.1*  PLT 286 310 288 233 225   Basic Metabolic Panel: Recent Labs  Lab 11/20/21 0245 11/21/21 0527 11/22/21 0435 11/23/21 0400 11/24/21 0512 11/25/21 0455 11/26/21 0357  NA 134* 135 136 135 136 136 133*  K 3.5 3.6 3.7 3.6 4.5 4.0 4.3  CL 106 104 105 103 104 105 103  CO2 22 22 24 25 25 24  21*  GLUCOSE 96  106* 113* 111* 120* 120* 134*  BUN 16 13 12 13 20  24* 25*  CREATININE 1.39* 1.35* 1.39* 1.29* 1.63* 1.54* 1.45*  CALCIUM 8.1* 8.2* 8.3* 8.3* 8.3* 8.3* 8.6*  MG 1.8 1.7 1.7  --   --  1.8 1.8  PHOS  --   --   --   --   --  2.6 2.3*   GFR: Estimated Creatinine Clearance: 64.1 mL/min (A) (by C-G formula based on SCr of 1.45 mg/dL (H)). Liver Function Tests: Recent Labs  Lab 11/23/21 0400 11/24/21 0512 11/25/21 0455 11/26/21 0357  AST 19 17 15 19   ALT 15 12 12 12   ALKPHOS 37* 32* 31* 33*  BILITOT 0.8 1.0 0.6 0.8  PROT 6.8 6.5 6.5 6.8  ALBUMIN 1.9* 2.2* 2.1* 2.0*   No results for input(s): "LIPASE", "AMYLASE" in the last 168 hours. No results for input(s): "AMMONIA" in the last 168 hours. Coagulation Profile: No results for input(s): "INR", "PROTIME" in the last 168 hours.  Cardiac Enzymes: No results for input(s): "CKTOTAL", "CKMB", "CKMBINDEX", "TROPONINI" in the last 168 hours. BNP (last 3 results) No results for input(s): "PROBNP" in the last  8760 hours. HbA1C: No results for input(s): "HGBA1C" in the last 72 hours. CBG: Recent Labs  Lab 11/25/21 2030 11/25/21 2344 11/26/21 0501 11/26/21 0740 11/26/21 1132  GLUCAP 142* 130* 153* 129* 138*   Lipid Profile: Recent Labs    11/25/21 0455  TRIG 122   Thyroid Function Tests: No results for input(s): "TSH", "T4TOTAL", "FREET4", "T3FREE", "THYROIDAB" in the last 72 hours. Anemia Panel: No results for input(s): "VITAMINB12", "FOLATE", "FERRITIN", "TIBC", "IRON", "RETICCTPCT" in the last 72 hours. Sepsis Labs: No results for input(s): "PROCALCITON", "LATICACIDVEN" in the last 168 hours.  Recent Results (from the past 240 hour(s))  Aerobic/Anaerobic Culture w Gram Stain (surgical/deep wound)     Status: None (Preliminary result)   Collection Time: 11/18/21  4:08 PM   Specimen: Abscess  Result Value Ref Range Status   Specimen Description ABSCESS ABDOMEN  Final   Special Requests LEFT LATERAL ABDOMEN DRAIN NO 2  Final    Gram Stain   Final    NO SQUAMOUS EPITHELIAL CELLS SEEN ABUNDANT WBC SEEN NO ORGANISMS SEEN    Culture   Final    RARE CANDIDA TROPICALIS NO ANAEROBES ISOLATED Sent to [facility] for further susceptibility testing. Performed at [facility], [address] [place], [place] [address]    Report Status PENDING  Incomplete  Aerobic/Anaerobic Culture w Gram Stain (surgical/deep wound)     Status: None   Collection Time: 11/18/21  4:08 PM   Specimen: Abscess  Result Value Ref Range Status   Specimen Description ABSCESS ABDOMEN  Final   Special Requests RT LOWER QUAD DRAIN NO 1  Final   Gram Stain   Final    NO SQUAMOUS EPITHELIAL CELLS SEEN MODERATE WBC SEEN NO ORGANISMS SEEN    Culture   Final    No growth aerobically or anaerobically. Performed at [facility], [address] [place], [place] [address]    Report Status 11/23/2021 FINAL  Final  Antifungal AST 9 Drug Panel     Status: None (Preliminary result)   Collection Time: 11/18/21  4:08 PM  Result Value Ref Range Status   Organism ID, Yeast Preliminary report  Final    Comment: (NOTE) Specimen has been received and testing has been initiated. Performed At: [facility] [address] [place], [place] [id number] Jolene Schimke MD EX:[id number]    Amphotericin B MIC PENDING  Incomplete   Anidulafungin MIC PENDING  Incomplete   Caspofungin MIC PENDING  Incomplete   Micafungin MIC PENDING  Incomplete   Posaconazole MIC PENDING  Incomplete   Fluconazole Islt MIC PENDING  Incomplete   Flucytosine MIC PENDING  Incomplete   Itraconazole MIC PENDING  Incomplete   Voriconazole MIC PENDING  Incomplete   Source ABSCESS/ CANDIDA TROPICALIS  Final    Comment: Performed at [facility], [address], [place], [place] [address]         Radiology Studies: No results found.      Scheduled Meds:  amLODipine  5 mg Oral Daily   Chlorhexidine Gluconate Cloth  6 each Topical Daily   feeding  supplement  1 Container Oral TID BM   insulin aspart  0-15 Units Subcutaneous Q6H   lip balm  1 application  Topical BID   mouth rinse  15 mL Mouth Rinse q12n4p   metoprolol succinate  50 mg Oral Daily   pantoprazole  40 mg Oral BID   sodium chloride flush  10-40 mL Intracatheter Q12H   Continuous  Infusions:  sodium chloride 250 mL (11/23/21 1629)   sodium chloride Stopped (11/09/21 1144)   sodium chloride 10 mL/hr at 11/19/21 0600   methocarbamol (ROBAXIN) IV 500 mg (11/26/21 1259)   micafungin (MYCAMINE) 100 mg in sodium chloride 0.9 % 100 mL IVPB 100 mg (11/25/21 1626)   ondansetron (ZOFRAN) IV     piperacillin-tazobactam (ZOSYN)  IV 3.375 g (11/26/21 0543)   TPN ADULT (ION) 70 mL/hr at 11/25/21 1715   TPN ADULT (ION)       LOS: 19 days      Briant Cedar, MD  11/26/2021, 4:55 PM

## 2021-11-27 ENCOUNTER — Inpatient Hospital Stay (HOSPITAL_COMMUNITY): Payer: Commercial Managed Care - HMO

## 2021-11-27 ENCOUNTER — Encounter (HOSPITAL_COMMUNITY): Payer: Self-pay | Admitting: Internal Medicine

## 2021-11-27 DIAGNOSIS — K651 Peritoneal abscess: Secondary | ICD-10-CM | POA: Diagnosis not present

## 2021-11-27 LAB — BASIC METABOLIC PANEL
Anion gap: 8 (ref 5–15)
BUN: 36 mg/dL — ABNORMAL HIGH (ref 8–23)
CO2: 19 mmol/L — ABNORMAL LOW (ref 22–32)
Calcium: 8 mg/dL — ABNORMAL LOW (ref 8.9–10.3)
Chloride: 103 mmol/L (ref 98–111)
Creatinine, Ser: 1.74 mg/dL — ABNORMAL HIGH (ref 0.61–1.24)
GFR, Estimated: 43 mL/min — ABNORMAL LOW (ref >60)
Glucose, Bld: 229 mg/dL — ABNORMAL HIGH (ref 70–99)
Potassium: 4.4 mmol/L (ref 3.5–5.1)
Sodium: 130 mmol/L — ABNORMAL LOW (ref 135–145)

## 2021-11-27 LAB — LACTIC ACID, PLASMA
Lactic Acid, Venous: 3.6 mmol/L (ref 0.5–1.9)
Lactic Acid, Venous: 3 mmol/L (ref 0.5–1.9)

## 2021-11-27 LAB — CBC
HCT: 29 % — ABNORMAL LOW (ref 39.0–52.0)
Hemoglobin: 9.1 g/dL — ABNORMAL LOW (ref 13.0–17.0)
MCH: 31.7 pg (ref 26.0–34.0)
MCHC: 31.4 g/dL (ref 30.0–36.0)
MCV: 101 fL — ABNORMAL HIGH (ref 80.0–100.0)
Platelets: 167 10*3/uL (ref 150–400)
RBC: 2.87 MIL/uL — ABNORMAL LOW (ref 4.22–5.81)
RDW: 15.1 % (ref 11.5–15.5)
WBC: 15.7 10*3/uL — ABNORMAL HIGH (ref 4.0–10.5)
nRBC: 0.1 % (ref 0.0–0.2)

## 2021-11-27 LAB — PHOSPHORUS: Phosphorus: 1.9 mg/dL — ABNORMAL LOW (ref 2.5–4.6)

## 2021-11-27 LAB — GLUCOSE, CAPILLARY
Glucose-Capillary: 140 mg/dL — ABNORMAL HIGH (ref 70–99)
Glucose-Capillary: 170 mg/dL — ABNORMAL HIGH (ref 70–99)
Glucose-Capillary: 233 mg/dL — ABNORMAL HIGH (ref 70–99)
Glucose-Capillary: 114 mg/dL — ABNORMAL HIGH (ref 70–99)
Glucose-Capillary: 152 mg/dL — ABNORMAL HIGH (ref 70–99)

## 2021-11-27 LAB — ANTIFUNGAL AST 9 DRUG PANEL
Amphotericin B MIC: 0.5
Fluconazole Islt MIC: 8
Flucytosine MIC: 0.12
Itraconazole MIC: 0.5
Posaconazole MIC: 1
Voriconazole MIC: 1

## 2021-11-27 LAB — TYPE AND SCREEN
ABO/RH(D): O POS
Antibody Screen: NEGATIVE
Unit division: 0
Unit division: 0

## 2021-11-27 LAB — BLOOD GAS, ARTERIAL
Acid-base deficit: 0.4 mmol/L (ref 0.0–2.0)
Acid-base deficit: 4.8 mmol/L — ABNORMAL HIGH (ref 0.0–2.0)
Bicarbonate: 20.5 mmol/L (ref 20.0–28.0)
Bicarbonate: 21.1 mmol/L (ref 20.0–28.0)
O2 Saturation: 89.7 %
O2 Saturation: 100 %
Patient temperature: 38.7
Patient temperature: 38.9
pCO2 arterial: 26 mmHg — ABNORMAL LOW (ref 32–48)
pCO2 arterial: 45 mmHg (ref 32–48)
pH, Arterial: 7.51 — ABNORMAL HIGH (ref 7.35–7.45)
pH, Arterial: 7.29 — ABNORMAL LOW (ref 7.35–7.45)
pO2, Arterial: 57 mmHg — ABNORMAL LOW (ref 83–108)
pO2, Arterial: 182 mmHg — ABNORMAL HIGH (ref 83–108)

## 2021-11-27 LAB — MAGNESIUM: Magnesium: 1.7 mg/dL (ref 1.7–2.4)

## 2021-11-27 LAB — COMPREHENSIVE METABOLIC PANEL
ALT: 16 U/L (ref 0–44)
AST: 38 U/L (ref 15–41)
Albumin: 2 g/dL — ABNORMAL LOW (ref 3.5–5.0)
Alkaline Phosphatase: 38 U/L (ref 38–126)
Anion gap: 10 (ref 5–15)
BUN: 32 mg/dL — ABNORMAL HIGH (ref 8–23)
CO2: 17 mmol/L — ABNORMAL LOW (ref 22–32)
Calcium: 8.4 mg/dL — ABNORMAL LOW (ref 8.9–10.3)
Chloride: 106 mmol/L (ref 98–111)
Creatinine, Ser: 1.81 mg/dL — ABNORMAL HIGH (ref 0.61–1.24)
GFR, Estimated: 41 mL/min — ABNORMAL LOW (ref >60)
Glucose, Bld: 172 mg/dL — ABNORMAL HIGH (ref 70–99)
Potassium: 4.8 mmol/L (ref 3.5–5.1)
Sodium: 133 mmol/L — ABNORMAL LOW (ref 135–145)
Total Bilirubin: 0.9 mg/dL (ref 0.3–1.2)
Total Protein: 7 g/dL (ref 6.5–8.1)

## 2021-11-27 LAB — BPAM RBC
Blood Product Expiration Date: 202307172359
Blood Product Expiration Date: 202307182359
Unit Type and Rh: 5100
Unit Type and Rh: 5100

## 2021-11-27 LAB — TROPONIN I (HIGH SENSITIVITY)
Troponin I (High Sensitivity): 48 ng/L — ABNORMAL HIGH (ref <18)
Troponin I (High Sensitivity): 52 ng/L — ABNORMAL HIGH (ref <18)

## 2021-11-27 MED ORDER — NOREPINEPHRINE 4 MG/250ML-% IV SOLN
0.0000 ug/min | INTRAVENOUS | Status: DC
  Administered 2021-11-27: 4 ug/min via INTRAVENOUS
  Administered 2021-11-27: 15 ug/min via INTRAVENOUS
  Administered 2021-11-27: 2 ug/min via INTRAVENOUS
  Administered 2021-11-27: 15 ug/min via INTRAVENOUS
  Administered 2021-11-28: 2 ug/min via INTRAVENOUS
  Filled 2021-11-27 (×3): qty 250

## 2021-11-27 MED ORDER — MIDAZOLAM HCL 2 MG/2ML IJ SOLN
2.0000 mg | INTRAMUSCULAR | Status: DC | PRN
  Administered 2021-11-27 – 2021-11-28 (×5): 2 mg via INTRAVENOUS
  Filled 2021-11-27 (×7): qty 2

## 2021-11-27 MED ORDER — DOCUSATE SODIUM 50 MG/5ML PO LIQD
100.0000 mg | Freq: Two times a day (BID) | ORAL | Status: DC
  Administered 2021-11-27 (×2): 100 mg
  Filled 2021-11-27 (×2): qty 10

## 2021-11-27 MED ORDER — FENTANYL CITRATE PF 50 MCG/ML IJ SOSY
50.0000 ug | PREFILLED_SYRINGE | INTRAMUSCULAR | Status: DC | PRN
  Administered 2021-11-29: 50 ug via INTRAVENOUS
  Filled 2021-11-27: qty 1

## 2021-11-27 MED ORDER — NOREPINEPHRINE 4 MG/250ML-% IV SOLN
INTRAVENOUS | Status: AC
  Filled 2021-11-27: qty 250

## 2021-11-27 MED ORDER — MIDAZOLAM HCL 2 MG/2ML IJ SOLN
INTRAMUSCULAR | Status: AC
  Administered 2021-11-27: 2 mg
  Filled 2021-11-27: qty 2

## 2021-11-27 MED ORDER — ACETAMINOPHEN 650 MG RE SUPP
650.0000 mg | RECTAL | Status: DC | PRN
  Administered 2021-11-27: 650 mg via RECTAL
  Filled 2021-11-27: qty 1

## 2021-11-27 MED ORDER — VANCOMYCIN HCL 2000 MG/400ML IV SOLN
2000.0000 mg | Freq: Once | INTRAVENOUS | Status: AC
  Administered 2021-11-27: 2000 mg via INTRAVENOUS
  Filled 2021-11-27: qty 400

## 2021-11-27 MED ORDER — VASOPRESSIN 20 UNITS/100 ML INFUSION FOR SHOCK
0.0000 [IU]/min | INTRAVENOUS | Status: DC
  Administered 2021-11-27: 0.03 [IU]/min via INTRAVENOUS
  Filled 2021-11-27: qty 100

## 2021-11-27 MED ORDER — VANCOMYCIN HCL IN DEXTROSE 1-5 GM/200ML-% IV SOLN
1000.0000 mg | INTRAVENOUS | Status: DC
  Administered 2021-11-28: 1000 mg via INTRAVENOUS
  Filled 2021-11-27: qty 200

## 2021-11-27 MED ORDER — SODIUM CHLORIDE 0.9% FLUSH
5.0000 mL | Freq: Three times a day (TID) | INTRAVENOUS | Status: DC
Start: 2021-11-27 — End: 2021-12-07
  Administered 2021-11-27 – 2021-12-07 (×26): 5 mL

## 2021-11-27 MED ORDER — TRAVASOL 10 % IV SOLN
INTRAVENOUS | Status: AC
  Filled 2021-11-27: qty 1299.6

## 2021-11-27 MED ORDER — FENTANYL CITRATE PF 50 MCG/ML IJ SOSY
50.0000 ug | PREFILLED_SYRINGE | INTRAMUSCULAR | Status: DC | PRN
  Administered 2021-11-27 (×5): 100 ug via INTRAVENOUS
  Filled 2021-11-27: qty 4
  Filled 2021-11-27 (×5): qty 2

## 2021-11-27 MED ORDER — SUCCINYLCHOLINE CHLORIDE 200 MG/10ML IV SOSY
PREFILLED_SYRINGE | INTRAVENOUS | Status: AC
  Filled 2021-11-27: qty 10

## 2021-11-27 MED ORDER — SODIUM PHOSPHATES 45 MMOLE/15ML IV SOLN
20.0000 mmol | Freq: Once | INTRAVENOUS | Status: AC
  Administered 2021-11-27: 20 mmol via INTRAVENOUS
  Filled 2021-11-27: qty 6.67

## 2021-11-27 MED ORDER — MIDAZOLAM HCL 2 MG/2ML IJ SOLN
2.0000 mg | INTRAMUSCULAR | Status: AC | PRN
  Administered 2021-11-27 (×3): 2 mg via INTRAVENOUS
  Filled 2021-11-27 (×2): qty 2

## 2021-11-27 MED ORDER — LACTATED RINGERS IV BOLUS
1000.0000 mL | Freq: Once | INTRAVENOUS | Status: AC
  Administered 2021-11-27: 1000 mL via INTRAVENOUS

## 2021-11-27 MED ORDER — LACTATED RINGERS IV BOLUS
500.0000 mL | Freq: Once | INTRAVENOUS | Status: AC
  Administered 2021-11-27: 500 mL via INTRAVENOUS

## 2021-11-27 MED ORDER — ETOMIDATE 2 MG/ML IV SOLN
INTRAVENOUS | Status: AC
  Administered 2021-11-27: 20 mg
  Filled 2021-11-27: qty 20

## 2021-11-27 MED ORDER — FENTANYL CITRATE (PF) 100 MCG/2ML IJ SOLN
INTRAMUSCULAR | Status: AC
  Administered 2021-11-27: 50 ug
  Filled 2021-11-27: qty 2

## 2021-11-27 MED ORDER — MIDAZOLAM HCL 2 MG/2ML IJ SOLN
INTRAMUSCULAR | Status: AC
  Filled 2021-11-27: qty 2

## 2021-11-27 MED ORDER — INSULIN ASPART 100 UNIT/ML IJ SOLN
0.0000 [IU] | INTRAMUSCULAR | Status: DC
  Administered 2021-11-27: 3 [IU] via SUBCUTANEOUS
  Administered 2021-11-27: 5 [IU] via SUBCUTANEOUS
  Administered 2021-11-28 – 2021-12-01 (×15): 2 [IU] via SUBCUTANEOUS

## 2021-11-27 MED ORDER — SODIUM CHLORIDE 0.9 % IV SOLN: INTRAVENOUS | Status: DC | PRN

## 2021-11-27 MED ORDER — MEROPENEM 1 G IV SOLR
1.0000 g | Freq: Three times a day (TID) | INTRAVENOUS | Status: DC
  Administered 2021-11-27 – 2021-11-30 (×11): 1 g via INTRAVENOUS
  Filled 2021-11-27 (×4): qty 20
  Filled 2021-11-27: qty 1
  Filled 2021-11-27 (×7): qty 20

## 2021-11-27 MED ORDER — ROCURONIUM BROMIDE 10 MG/ML (PF) SYRINGE
PREFILLED_SYRINGE | INTRAVENOUS | Status: AC
  Administered 2021-11-27: 100 mg
  Filled 2021-11-27: qty 10

## 2021-11-27 MED ORDER — PANTOPRAZOLE 2 MG/ML SUSPENSION
40.0000 mg | Freq: Every day | ORAL | Status: DC
  Administered 2021-11-27: 40 mg
  Filled 2021-11-27: qty 20

## 2021-11-27 MED ORDER — POLYETHYLENE GLYCOL 3350 17 G PO PACK
17.0000 g | PACK | Freq: Every day | ORAL | Status: DC
  Administered 2021-11-27: 17 g
  Filled 2021-11-27: qty 1

## 2021-11-27 MED ORDER — FENTANYL CITRATE (PF) 100 MCG/2ML IJ SOLN
INTRAMUSCULAR | Status: AC
  Filled 2021-11-27: qty 2

## 2021-11-27 MED ORDER — HEPARIN SODIUM (PORCINE) 5000 UNIT/ML IJ SOLN
5000.0000 [IU] | Freq: Three times a day (TID) | INTRAMUSCULAR | Status: DC
  Administered 2021-11-27 – 2021-12-07 (×32): 5000 [IU] via SUBCUTANEOUS
  Filled 2021-11-27 (×32): qty 1

## 2021-11-27 MED ORDER — FENTANYL CITRATE PF 50 MCG/ML IJ SOSY
PREFILLED_SYRINGE | INTRAMUSCULAR | Status: AC
  Administered 2021-11-27: 100 ug via INTRAVENOUS
  Filled 2021-11-27: qty 2

## 2021-11-27 MED ORDER — MAGNESIUM SULFATE IN D5W 1-5 GM/100ML-% IV SOLN
1.0000 g | Freq: Once | INTRAVENOUS | Status: AC
  Administered 2021-11-27: 1 g via INTRAVENOUS
  Filled 2021-11-27: qty 100

## 2021-11-27 MED ORDER — KETAMINE HCL 50 MG/5ML IJ SOSY
PREFILLED_SYRINGE | INTRAMUSCULAR | Status: AC
  Filled 2021-11-27: qty 5

## 2021-11-27 NOTE — Progress Notes (Addendum)
NAME:  Robert Larson, MRN:  [id number], DOB:  1956-03-23, LOS: 20 ADMISSION DATE:  11/07/2021, CONSULTATION DATE: 6/24 2023 REFERRING MD: Dr. Loney Loh, CHIEF COMPLAINT: Dyspnea  History of Present Illness:  66 y/o male admitted on 6/4 in the setting of sepsis from multiple intra-abdominal abscesses to the hospitalists service.  He has been treated with antibiotics, intra-abdominal fluid drainage via percutaneous catheters, and supportive care.  In the setting of septic shock his course has been complicated by AKI, hypoxemia and atrial fibrillation with RVR.  He has had multiple drains placed, one from 6/18 grew candida tropicalis so ID was consulted.  On 6/20 he underwent an ex-lap and drainage of pelvic abscesses likely due to complicated diverticulitis by Dr. Dwain Sarna.  Per surgery notes he was originally planned for at least a partial colectomy but noted intra-operatively to have a large portion of his abdomen frozen so bowel resection was not possible.  A 19 French drain was placed, IR drains removed.  Urology also participated in the procedure and performed bilateral retrograde pyelogram and bilateral open-ended ureteral catheter placement.     PCCM was consulted on 6/24 around 2 AM due to a change in mental status, worsening tachycardia and tachypnea.  He was unable to provide history.      Pertinent  Medical History   Past Medical History:  Diagnosis Date   Acute right-sided low back pain with bilateral sciatica 03/13/2017   Contusion of left hip and thigh 02/13/2017   Osteoarthritis    Protein in urine    Sleep apnea      Significant Hospital Events: Including procedures, antibiotic start and stop dates in addition to other pertinent events   6/4 admission, started zosyn 6/5 moved to ICU septic shock, IR drain placed in abdomen, grew e-coli and mixed anaerobic fluid 6/13 drain removed by IR  6/14 recurrent pelvic abscess on CT abdomen 6/15 2 new drains placed by IR in lower  abdomen bilaterally, grew candida tropicalis 6/17 ID consult: micafungin 6/20 ex-lap, bilateral open ended ureteral catheter placement, drain placed in pelvic fluid collection 6/24 pccm consulted for confusion, fever tachycardia, remains on zosyn, intubated for airway protection, increased work of breathing with confusion.  Respiratory alkalosis on ABG, lactic acid elevated at 3.7. daughter confirms full code status by phone.  Interim History / Subjective:  Requiring pressors, arousable Plan for OR  Objective   Blood pressure (!) 119/47, pulse 71, temperature (!) 105.3 F (40.7 C), temperature source Rectal, resp. rate (!) 26, height 5' 6.93" (1.7 m), weight 117.1 kg, SpO2 100 %.    Vent Mode: PRVC FiO2 (%):  [50 %-100 %] 50 % Set Rate:  [24 bmp] 24 bmp Vt Set:  [530 mL] 530 mL PEEP:  [5 cmH20] 5 cmH20 Plateau Pressure:  [18 cmH20-21 cmH20] 21 cmH20   Intake/Output Summary (Last 24 hours) at 11/27/2021 0828 Last data filed at 11/27/2021 0730 Gross per 24 hour  Intake 2136.2 ml  Output 1552 ml  Net 584.2 ml   Filed Weights   11/21/21 0000 11/23/21 1041 11/27/21 0000  Weight: 121.5 kg 121.5 kg 117.1 kg    Examination: General: Acutely ill-appearing, on vent HENT: Endotracheal tube in place Lungs: Clear breath sounds, decreased at the bases Cardiovascular: S1-S2 appreciated Abdomen: Bowel sounds appreciated, drain in place Extremities: Mild edema Neuro: Arousable GU: Fair output  Resolved Hospital Problem list   Atrial fibrillation with RVR  Assessment & Plan:  Septic shock in the setting of intra-abdominal abscesses Aspiration  pneumonia -Continued fluid resuscitation -Continue pressors for MAP greater than 65, currently on Levophed 20 and vasopressin -Continue micafungin -Culture from drain reveals Candida-on micafungin  Fever due to sepsis Intra-abdominal sepsis -Off cooling blanket -Dextromethorphan, trazodone discontinued  Acute respiratory failure with  hypoxemia Concern for aspiration pneumonia -Intubated early this morning -VAP prevention -Not ready for weaning/extubation today  Multiple intra-abdominal abscesses S/p exploratory laparotomy -Remains n.p.o. -On TPN -Continue wound VAC -General surgery following  AKI -Continue to monitor be met -Replace electrolytes -Maintain renal perfusion  Type 2 diabetes -SSI  GERD -PPI   Best Practice (right click and "Reselect all SmartList Selections" daily)   Diet/type: TPN DVT prophylaxis: prophylactic heparin  GI prophylaxis: PPI Lines: N/A Foley:  N/A Code Status:  full code Last date of multidisciplinary goals of care discussion [Dr. Kendrick Fries did discuss with daughter 6/24]  Labs   CBC: Recent Labs  Lab 11/23/21 1453 11/24/21 0512 11/25/21 0455 11/26/21 0357 11/27/21 0209  WBC 9.7 11.4* 9.9 10.6* 15.7*  HGB 9.6* 9.3* 8.1* 8.8* 9.1*  HCT 29.9* 28.8* 25.9* 28.2* 29.0*  MCV 100.0 100.0 100.8* 101.1* 101.0*  PLT 310 288 233 225 167    Basic Metabolic Panel: Recent Labs  Lab 11/21/21 0527 11/22/21 0435 11/23/21 0400 11/24/21 0512 11/25/21 0455 11/26/21 0357 11/27/21 0209  NA 135 136 135 136 136 133* 133*  K 3.6 3.7 3.6 4.5 4.0 4.3 4.8  CL 104 105 103 104 105 103 106  CO2 22 24 25 25 24  21* 17*  GLUCOSE 106* 113* 111* 120* 120* 134* 172*  BUN 13 12 13 20  24* 25* 32*  CREATININE 1.35* 1.39* 1.29* 1.63* 1.54* 1.45* 1.81*  CALCIUM 8.2* 8.3* 8.3* 8.3* 8.3* 8.6* 8.4*  MG 1.7 1.7  --   --  1.8 1.8 1.7  PHOS  --   --   --   --  2.6 2.3* 1.9*   GFR: Estimated Creatinine Clearance: 50.4 mL/min (A) (by C-G formula based on SCr of 1.81 mg/dL (H)). Recent Labs  Lab 11/24/21 0512 11/25/21 0455 11/26/21 0357 11/27/21 0209 11/27/21 0456  WBC 11.4* 9.9 10.6* 15.7*  --   LATICACIDVEN  --   --   --  3.6* 3.0*    Liver Function Tests: Recent Labs  Lab 11/23/21 0400 11/24/21 0512 11/25/21 0455 11/26/21 0357 11/27/21 0209  AST 19 17 15 19  38  ALT 15 12 12  12 16   ALKPHOS 37* 32* 31* 33* 38  BILITOT 0.8 1.0 0.6 0.8 0.9  PROT 6.8 6.5 6.5 6.8 7.0  ALBUMIN 1.9* 2.2* 2.1* 2.0* 2.0*   No results for input(s): "LIPASE", "AMYLASE" in the last 168 hours. No results for input(s): "AMMONIA" in the last 168 hours.  ABG    Component Value Date/Time   PHART 7.29 (L) 11/27/2021 0425   PCO2ART 45 11/27/2021 0425   PO2ART 182 (H) 11/27/2021 0425   HCO3 21.1 11/27/2021 0425   ACIDBASEDEF 4.8 (H) 11/27/2021 0425   O2SAT 100 11/27/2021 0425     Coagulation Profile: No results for input(s): "INR", "PROTIME" in the last 168 hours.  Cardiac Enzymes: No results for input(s): "CKTOTAL", "CKMB", "CKMBINDEX", "TROPONINI" in the last 168 hours.  HbA1C: Hemoglobin A1C  Date/Time Value Ref Range Status  09/16/2021 08:34 AM 6.1 (A) 4.0 - 5.6 % Final    CBG: Recent Labs  Lab 11/26/21 1132 11/26/21 1810 11/26/21 2350 11/27/21 0239 11/27/21 0621  GLUCAP 138* 129* 150* 140* 170*    Review of Systems:  On vent, easily arousable  Past Medical History:  He,  has a past medical history of Acute right-sided low back pain with bilateral sciatica (03/13/2017), Contusion of left hip and thigh (02/13/2017), Osteoarthritis, Protein in urine, and Sleep apnea.   Surgical History:   Past Surgical History:  Procedure Laterality Date   CYSTOSCOPY WITH STENT PLACEMENT Bilateral 11/23/2021   Procedure: CYSTOSCOPY WITH STENT PLACEMENT;  Surgeon: Crista Elliot, MD;  Location: [facility];  Service: Urology;  Laterality: Bilateral;   LAPAROTOMY N/A 11/23/2021   Procedure: EXPLORATORY LAPAROTOMY, PARTIAL COLECTOMY AND COLOSTOMY;  Surgeon: Emelia Loron, MD;  Location: [facility];  Service: General;  Laterality: N/A;   REPLACEMENT TOTAL HIP W/  RESURFACING IMPLANTS     Left      Social History:   reports that he has been smoking cigarettes. He has been smoking an average of 1 pack per day. He has never used smokeless tobacco. He reports that he does not drink  alcohol and does not use drugs.   Family History:  His family history includes Breast cancer in his mother; Lung cancer in his father. There is no history of Colon cancer or Rectal cancer.   Allergies No Known Allergies   Home Medications  Prior to Admission medications   Medication Sig Start Date End Date Taking? Authorizing Provider  FARXIGA 10 MG TABS tablet Take 10 mg by mouth daily.   Yes [provider]  metoprolol succinate (TOPROL-XL) 25 MG 24 hr tablet Take 1 tablet (25 mg total) by mouth at bedtime. START with 1/2 pill for one week, then increase to 1 full pill. Patient taking differently: Take 25 mg by mouth at bedtime. 10/18/21  Yes Dulce Sellar, NP  dapagliflozin propanediol (FARXIGA) 5 MG TABS tablet Take 1 tablet (5 mg total) by mouth daily. Patient not taking: Reported on 11/07/2021 10/14/21   Dulce Sellar, NP  Dulaglutide (TRULICITY) 1.5 MG/0.5ML SOPN Inject 1.5 mg into the skin once a week. Start after you run out of the 0.75mg  dose x 2 each week. Patient not taking: Reported on 11/07/2021 10/14/21   Dulce Sellar, NP  metFORMIN (GLUCOPHAGE-XR) 500 MG 24 hr tablet TAKE 1 TABLET BY MOUTH EVERY DAY WITH BREAKFAST Patient not taking: Reported on 11/07/2021 10/14/21   Dulce Sellar, NP  Omega-3 Fatty Acids (FISH OIL PO) Take 2 capsules by mouth daily. Patient not taking: Reported on 11/07/2021    [provider]  telmisartan-hydrochlorothiazide (MICARDIS HCT) 80-25 MG tablet Take 1 tablet by mouth daily. Patient not taking: Reported on 11/07/2021    [provider]  varenicline (CHANTIX) 0.5 MG tablet INCREASE TO 2 TABS IN AM, 1 TAB IN PM FOR 1-2 WEEKS, THEN 2 TABS AM AND 2 TABS PM. Patient not taking: Reported on 11/07/2021 10/08/21   Dulce Sellar, NP    The patient is critically ill with multiple organ systems failure and requires high complexity decision making for assessment and support, frequent evaluation and titration of therapies,  application of advanced monitoring technologies and extensive interpretation of multiple databases. Critical Care Time devoted to patient care services described in this note independent of APP/resident time (if applicable)  is 40 minutes.   Virl Diamond MD  Pulmonary Critical Care Personal pager: See Amion If unanswered, please page CCM On-call: #[phone]

## 2021-11-27 NOTE — Progress Notes (Signed)
ABG collected and send down to lab for analysis. Lab notified.  

## 2021-11-28 DIAGNOSIS — K651 Peritoneal abscess: Secondary | ICD-10-CM | POA: Diagnosis not present

## 2021-11-28 DIAGNOSIS — L8990 Pressure ulcer of unspecified site, unspecified stage: Secondary | ICD-10-CM

## 2021-11-28 HISTORY — DX: Pressure ulcer of unspecified site, unspecified stage: L89.90

## 2021-11-28 LAB — GLUCOSE, CAPILLARY
Glucose-Capillary: 121 mg/dL — ABNORMAL HIGH (ref 70–99)
Glucose-Capillary: 125 mg/dL — ABNORMAL HIGH (ref 70–99)
Glucose-Capillary: 128 mg/dL — ABNORMAL HIGH (ref 70–99)
Glucose-Capillary: 133 mg/dL — ABNORMAL HIGH (ref 70–99)
Glucose-Capillary: 134 mg/dL — ABNORMAL HIGH (ref 70–99)
Glucose-Capillary: 150 mg/dL — ABNORMAL HIGH (ref 70–99)

## 2021-11-28 LAB — CBC
HCT: 22.3 % — ABNORMAL LOW (ref 39.0–52.0)
Hemoglobin: 7 g/dL — ABNORMAL LOW (ref 13.0–17.0)
MCH: 32 pg (ref 26.0–34.0)
MCHC: 31.4 g/dL (ref 30.0–36.0)
MCV: 101.8 fL — ABNORMAL HIGH (ref 80.0–100.0)
Platelets: 154 10*3/uL (ref 150–400)
RBC: 2.19 MIL/uL — ABNORMAL LOW (ref 4.22–5.81)
RDW: 15.2 % (ref 11.5–15.5)
WBC: 13.1 10*3/uL — ABNORMAL HIGH (ref 4.0–10.5)
nRBC: 0 % (ref 0.0–0.2)

## 2021-11-28 LAB — COMPREHENSIVE METABOLIC PANEL
ALT: 14 U/L (ref 0–44)
AST: 26 U/L (ref 15–41)
Albumin: 1.6 g/dL — ABNORMAL LOW (ref 3.5–5.0)
Alkaline Phosphatase: 33 U/L — ABNORMAL LOW (ref 38–126)
Anion gap: 6 (ref 5–15)
BUN: 38 mg/dL — ABNORMAL HIGH (ref 8–23)
CO2: 24 mmol/L (ref 22–32)
Calcium: 8.3 mg/dL — ABNORMAL LOW (ref 8.9–10.3)
Chloride: 106 mmol/L (ref 98–111)
Creatinine, Ser: 1.39 mg/dL — ABNORMAL HIGH (ref 0.61–1.24)
GFR, Estimated: 57 mL/min — ABNORMAL LOW (ref >60)
Glucose, Bld: 144 mg/dL — ABNORMAL HIGH (ref 70–99)
Potassium: 4.1 mmol/L (ref 3.5–5.1)
Sodium: 136 mmol/L (ref 135–145)
Total Bilirubin: 0.6 mg/dL (ref 0.3–1.2)
Total Protein: 6 g/dL — ABNORMAL LOW (ref 6.5–8.1)

## 2021-11-28 LAB — PHOSPHORUS: Phosphorus: 2.7 mg/dL (ref 2.5–4.6)

## 2021-11-28 LAB — MAGNESIUM: Magnesium: 2.1 mg/dL (ref 1.7–2.4)

## 2021-11-28 MED ORDER — VANCOMYCIN HCL 1250 MG/250ML IV SOLN
1250.0000 mg | INTRAVENOUS | Status: DC
  Administered 2021-11-29: 1250 mg via INTRAVENOUS
  Filled 2021-11-28: qty 250

## 2021-11-28 MED ORDER — DOCUSATE SODIUM 100 MG PO CAPS
100.0000 mg | ORAL_CAPSULE | Freq: Two times a day (BID) | ORAL | Status: DC
  Administered 2021-11-28 – 2021-12-07 (×8): 100 mg via ORAL
  Filled 2021-11-28 (×16): qty 1

## 2021-11-28 MED ORDER — PANTOPRAZOLE SODIUM 40 MG PO TBEC
40.0000 mg | DELAYED_RELEASE_TABLET | Freq: Every day | ORAL | Status: DC
Start: 2021-11-28 — End: 2021-12-07
  Administered 2021-11-28 – 2021-12-07 (×10): 40 mg via ORAL
  Filled 2021-11-28 (×10): qty 1

## 2021-11-28 MED ORDER — POLYETHYLENE GLYCOL 3350 17 G PO PACK
17.0000 g | PACK | Freq: Every day | ORAL | Status: DC
  Administered 2021-11-29: 17 g via ORAL
  Filled 2021-11-28 (×7): qty 1

## 2021-11-28 MED ORDER — TRAVASOL 10 % IV SOLN
INTRAVENOUS | Status: AC
  Filled 2021-11-28: qty 1299.6

## 2021-11-29 ENCOUNTER — Encounter (HOSPITAL_COMMUNITY): Payer: Self-pay | Admitting: Internal Medicine

## 2021-11-29 DIAGNOSIS — G4733 Obstructive sleep apnea (adult) (pediatric): Secondary | ICD-10-CM | POA: Diagnosis not present

## 2021-11-29 DIAGNOSIS — K219 Gastro-esophageal reflux disease without esophagitis: Secondary | ICD-10-CM | POA: Insufficient documentation

## 2021-11-29 DIAGNOSIS — K651 Peritoneal abscess: Secondary | ICD-10-CM | POA: Diagnosis not present

## 2021-11-29 DIAGNOSIS — E878 Other disorders of electrolyte and fluid balance, not elsewhere classified: Secondary | ICD-10-CM

## 2021-11-29 DIAGNOSIS — E1122 Type 2 diabetes mellitus with diabetic chronic kidney disease: Secondary | ICD-10-CM

## 2021-11-29 DIAGNOSIS — J9600 Acute respiratory failure, unspecified whether with hypoxia or hypercapnia: Secondary | ICD-10-CM

## 2021-11-29 HISTORY — DX: Other disorders of electrolyte and fluid balance, not elsewhere classified: E87.8

## 2021-11-29 HISTORY — DX: Acute respiratory failure, unspecified whether with hypoxia or hypercapnia: J96.00

## 2021-11-29 LAB — PHOSPHORUS: Phosphorus: 2.4 mg/dL — ABNORMAL LOW (ref 2.5–4.6)

## 2021-11-29 LAB — COMPREHENSIVE METABOLIC PANEL
ALT: 18 U/L (ref 0–44)
AST: 34 U/L (ref 15–41)
Albumin: 1.7 g/dL — ABNORMAL LOW (ref 3.5–5.0)
Alkaline Phosphatase: 33 U/L — ABNORMAL LOW (ref 38–126)
Anion gap: 7 (ref 5–15)
BUN: 31 mg/dL — ABNORMAL HIGH (ref 8–23)
CO2: 24 mmol/L (ref 22–32)
Calcium: 8 mg/dL — ABNORMAL LOW (ref 8.9–10.3)
Chloride: 104 mmol/L (ref 98–111)
Creatinine, Ser: 1.13 mg/dL (ref 0.61–1.24)
GFR, Estimated: >60 mL/min (ref >60)
Glucose, Bld: 126 mg/dL — ABNORMAL HIGH (ref 70–99)
Potassium: 4.2 mmol/L (ref 3.5–5.1)
Sodium: 135 mmol/L (ref 135–145)
Total Bilirubin: 0.6 mg/dL (ref 0.3–1.2)
Total Protein: 6.4 g/dL — ABNORMAL LOW (ref 6.5–8.1)

## 2021-11-29 LAB — GLUCOSE, CAPILLARY
Glucose-Capillary: 119 mg/dL — ABNORMAL HIGH (ref 70–99)
Glucose-Capillary: 122 mg/dL — ABNORMAL HIGH (ref 70–99)
Glucose-Capillary: 123 mg/dL — ABNORMAL HIGH (ref 70–99)
Glucose-Capillary: 123 mg/dL — ABNORMAL HIGH (ref 70–99)
Glucose-Capillary: 131 mg/dL — ABNORMAL HIGH (ref 70–99)
Glucose-Capillary: 140 mg/dL — ABNORMAL HIGH (ref 70–99)

## 2021-11-29 LAB — CBC WITH DIFFERENTIAL/PLATELET
Abs Immature Granulocytes: 0.13 10*3/uL — ABNORMAL HIGH (ref 0.00–0.07)
Basophils Absolute: 0 10*3/uL (ref 0.0–0.1)
Basophils Relative: 0 %
Eosinophils Absolute: 0.2 10*3/uL (ref 0.0–0.5)
Eosinophils Relative: 3 %
HCT: 25.2 % — ABNORMAL LOW (ref 39.0–52.0)
Hemoglobin: 7.9 g/dL — ABNORMAL LOW (ref 13.0–17.0)
Immature Granulocytes: 2 %
Lymphocytes Relative: 14 %
Lymphs Abs: 1.2 10*3/uL (ref 0.7–4.0)
MCH: 31.6 pg (ref 26.0–34.0)
MCHC: 31.3 g/dL (ref 30.0–36.0)
MCV: 100.8 fL — ABNORMAL HIGH (ref 80.0–100.0)
Monocytes Absolute: 0.8 10*3/uL (ref 0.1–1.0)
Monocytes Relative: 9 %
Neutro Abs: 6.3 10*3/uL (ref 1.7–7.7)
Neutrophils Relative %: 72 %
Platelets: 146 10*3/uL — ABNORMAL LOW (ref 150–400)
RBC: 2.5 MIL/uL — ABNORMAL LOW (ref 4.22–5.81)
RDW: 15.7 % — ABNORMAL HIGH (ref 11.5–15.5)
WBC: 8.7 10*3/uL (ref 4.0–10.5)
nRBC: 0 % (ref 0.0–0.2)

## 2021-11-29 LAB — AEROBIC/ANAEROBIC CULTURE W GRAM STAIN (SURGICAL/DEEP WOUND): Gram Stain: NONE SEEN

## 2021-11-29 LAB — TRIGLYCERIDES: Triglycerides: 110 mg/dL (ref <150)

## 2021-11-29 LAB — MAGNESIUM: Magnesium: 2 mg/dL (ref 1.7–2.4)

## 2021-11-29 MED ORDER — METOPROLOL SUCCINATE ER 25 MG PO TB24
25.0000 mg | ORAL_TABLET | Freq: Every day | ORAL | Status: DC
  Administered 2021-11-29 – 2021-12-07 (×9): 25 mg via ORAL
  Filled 2021-11-29 (×9): qty 1

## 2021-11-29 MED ORDER — TRAVASOL 10 % IV SOLN
INTRAVENOUS | Status: AC
  Filled 2021-11-29: qty 1299.6

## 2021-11-29 MED ORDER — METHOCARBAMOL 500 MG PO TABS
500.0000 mg | ORAL_TABLET | Freq: Four times a day (QID) | ORAL | Status: DC | PRN
  Administered 2021-11-29 – 2021-12-02 (×2): 500 mg via ORAL
  Filled 2021-11-29 (×3): qty 1

## 2021-11-29 NOTE — Progress Notes (Signed)
OT Cancellation Note  Patient Details Name: Robert Larson MRN: [id number] DOB: [birth year]   Cancelled Treatment:    Reason Eval/Treat Not Completed: Other (comment) Patient is in the process of moving to alternative floor at this time with nursing staff. OT to continue to follow and check back as schedule will allow.  Sharyn Blitz OTR/L, MS Acute Rehabilitation Department Office# [phone] Pager# [phone]  11/29/2021, 3:23 PM

## 2021-11-29 NOTE — Consult Note (Addendum)
WOC Nurse Consult Note: Reason for Consult: Vac dressing changed with surgical PA at the bedside to assess wound appearance.  Pt was medicated for pain prior to the procedure and tolerated with minimal amt discomfort.  Wound type: Abd with full thickness post-op wound; refer to previous progress notes for measurements.  Wound bed: beefy red with blue sutures visible in the center. Drainage (amount, consistency, odor) minimal amt pink drainage Periwound: intact  Dressing procedure/placement/frequency: Applied one piece black foam to 175m cont suction.  Barrier ring to lower edges to attempt to maintain a seal. WOC team will plan for dressing change on Wed.  DJulien GirtMSN, RN, [place]

## 2021-11-30 DIAGNOSIS — B9629 Other Escherichia coli [E. coli] as the cause of diseases classified elsewhere: Secondary | ICD-10-CM

## 2021-11-30 DIAGNOSIS — K651 Peritoneal abscess: Secondary | ICD-10-CM | POA: Diagnosis not present

## 2021-11-30 LAB — COMPREHENSIVE METABOLIC PANEL
ALT: 19 U/L (ref 0–44)
AST: 35 U/L (ref 15–41)
Albumin: 1.8 g/dL — ABNORMAL LOW (ref 3.5–5.0)
Alkaline Phosphatase: 38 U/L (ref 38–126)
Anion gap: 6 (ref 5–15)
BUN: 26 mg/dL — ABNORMAL HIGH (ref 8–23)
CO2: 22 mmol/L (ref 22–32)
Calcium: 8.4 mg/dL — ABNORMAL LOW (ref 8.9–10.3)
Chloride: 107 mmol/L (ref 98–111)
Creatinine, Ser: 1.07 mg/dL (ref 0.61–1.24)
GFR, Estimated: >60 mL/min (ref >60)
Glucose, Bld: 128 mg/dL — ABNORMAL HIGH (ref 70–99)
Potassium: 4.4 mmol/L (ref 3.5–5.1)
Sodium: 135 mmol/L (ref 135–145)
Total Bilirubin: 0.7 mg/dL (ref 0.3–1.2)
Total Protein: 6.7 g/dL (ref 6.5–8.1)

## 2021-11-30 LAB — GLUCOSE, CAPILLARY
Glucose-Capillary: 119 mg/dL — ABNORMAL HIGH (ref 70–99)
Glucose-Capillary: 124 mg/dL — ABNORMAL HIGH (ref 70–99)
Glucose-Capillary: 127 mg/dL — ABNORMAL HIGH (ref 70–99)
Glucose-Capillary: 133 mg/dL — ABNORMAL HIGH (ref 70–99)
Glucose-Capillary: 136 mg/dL — ABNORMAL HIGH (ref 70–99)
Glucose-Capillary: 145 mg/dL — ABNORMAL HIGH (ref 70–99)

## 2021-11-30 LAB — CBC
HCT: 26.3 % — ABNORMAL LOW (ref 39.0–52.0)
Hemoglobin: 8.3 g/dL — ABNORMAL LOW (ref 13.0–17.0)
MCH: 31.7 pg (ref 26.0–34.0)
MCHC: 31.6 g/dL (ref 30.0–36.0)
MCV: 100.4 fL — ABNORMAL HIGH (ref 80.0–100.0)
Platelets: 179 10*3/uL (ref 150–400)
RBC: 2.62 MIL/uL — ABNORMAL LOW (ref 4.22–5.81)
RDW: 15.9 % — ABNORMAL HIGH (ref 11.5–15.5)
WBC: 9.5 10*3/uL (ref 4.0–10.5)
nRBC: 0.2 % (ref 0.0–0.2)

## 2021-11-30 LAB — PHOSPHORUS: Phosphorus: 2.7 mg/dL (ref 2.5–4.6)

## 2021-11-30 MED ORDER — AMLODIPINE BESYLATE 5 MG PO TABS
5.0000 mg | ORAL_TABLET | Freq: Every day | ORAL | Status: DC
  Administered 2021-11-30: 5 mg via ORAL
  Filled 2021-11-30: qty 1

## 2021-11-30 MED ORDER — MORPHINE SULFATE (PF) 2 MG/ML IV SOLN
2.0000 mg | INTRAVENOUS | Status: DC | PRN
  Filled 2021-11-30 (×2): qty 1

## 2021-11-30 MED ORDER — PIPERACILLIN-TAZOBACTAM 3.375 G IVPB
3.3750 g | Freq: Three times a day (TID) | INTRAVENOUS | Status: DC
  Administered 2021-11-30 – 2021-12-05 (×14): 3.375 g via INTRAVENOUS
  Filled 2021-11-30 (×14): qty 50

## 2021-11-30 MED ORDER — TRAVASOL 10 % IV SOLN
INTRAVENOUS | Status: AC
  Filled 2021-11-30: qty 1299.6

## 2021-11-30 NOTE — Progress Notes (Signed)
PROGRESS NOTE    Robert Larson  ZOX:[id number] DOB: 1955-09-28 DOA: 11/07/2021 PCP: Dulce Sellar, NP   Brief Narrative:  66 y/o male admitted on 6/4 in the setting of sepsis from multiple intra-abdominal abscesses to the hospitalists service.  He has been treated with antibiotics, intra-abdominal fluid drainage via percutaneous catheters, and supportive care.  In the setting of septic shock his course has been complicated by AKI, hypoxemia and atrial fibrillation with RVR.  He has had multiple drains placed, one from 6/18 grew candida tropicalis so ID was consulted.  On 6/20 he underwent an ex-lap and drainage of pelvic abscesses likely due to complicated diverticulitis by Dr. Dwain Sarna.  Per surgery notes he was originally planned for at least a partial colectomy but noted intra-operatively to have a large portion of his abdomen frozen so bowel resection was not possible.  A 19 French drain was placed, IR drains removed.  Urology also participated in the procedure and performed bilateral retrograde pyelogram and bilateral open-ended ureteral catheter placement.   PCCM was consulted on 6/24 around 2 AM due to a change in mental status, worsening tachycardia and tachypnea.  Transferred to ICU, intubated.  Significant Hospital Events: Including procedures, antibiotic start and stop dates in addition to other pertinent events   6/4 admission, started zosyn 6/5 moved to ICU septic shock, IR drain placed in abdomen, grew e-coli and mixed anaerobic fluid 6/13 drain removed by IR  6/14 recurrent pelvic abscess on CT abdomen 6/15 2 new drains placed by IR in lower abdomen bilaterally, grew candida tropicalis 6/17 ID consult: micafungin 6/20 ex-lap, bilateral open ended ureteral catheter placement, drain placed in pelvic fluid collection 6/24 pccm consulted for confusion, fever tachycardia, remains on zosyn, intubated for airway protection, increased work of breathing with confusion.  Respiratory  alkalosis on ABG, lactic acid elevated at 3.7. daughter confirms full code status by phone. 6/25 extubated. A line removed. 6/26 vanc stopped 6/27, transferred under TRH. Assessment & Plan:   Principal Problem:   Intra-abdominal abscesses s/p perc drainage 11/08/2021 Active Problems:   Alteration in electrolyte and fluid balance   Essential hypertension   Type 2 diabetes mellitus with morbid obesity (HCC)   Diverticulosis   Acute renal failure superimposed on stage 3a chronic kidney disease (HCC)   OSA (obstructive sleep apnea)   Osteoarthritis   Adrenal nodule (HCC)   History of adenomatous polyp of colon   Spinal stenosis of lumbar region   Renal cyst   Cholelithiasis   Tobacco abuse   Pressure injury of skin   GERD (gastroesophageal reflux disease)  Sepsis (abd) Multiple intra-abdominal abscesses: Initially managed with IR drain placement 6/5 and 6/15 with cultures growing E. coli, anaerobes, and Candida tropicalis.  Now status post exploratory laparotomy and drainage of pelvic abscess with general surgery 11/23/2021 (no new cultures) and found to have a good portion of his abdomen frozen at that time so unable to safely undergo resection of the sigmoid colon or diversion.  Repeat CT scan 6/24 given clinical instability showed a interval development of a large loculated fluid collection (5.3 x 9.9 x 21 cm) status post IR drain placement.  Gram stain with GNRs and cultures growing pansensitive E. coli.  Patient on Merrem and micafungin, managed by ID.  Vancomycin stopped on 11/29/2021.  Patient still with abdominal pain.  Tolerating clear liquid diet, surgery following, they are advancing to full liquid diet.  Drains are managed by IR. He is improving.  TPN continues as well.  Acute respiratory failure with hypoxemia w/ concern for aspiration: Needed intubation on 11/27/2021 and successfully extubated 6/25.  Currently no complaints and saturating over 90% on room air.  Continue incentive  spirometry.  Acute kidney injury: Patient does not appear to have CKD per my chart review but it is documented at several notes that he had CKD.  CKD ruled out.  He had AKI which is resolved.  Essential hypertension: PTA he was on telmisartan and hydrochlorothiazide which are on hold due to AKI.  We will start him on amlodipine 5 mg p.o. daily and as needed IV hydralazine.  Continue Toprol-XL.  Type 2 diabetes mellitus: At home takes Trulicity and metformin which are on hold.  Blood sugar controlled.  Continue SSI.  GERD: Continue PPI.  DVT prophylaxis: heparin injection 5,000 Units Start: 11/27/21 0600 Place TED hose Start: 11/21/21 0950 SCDs Start: 11/07/21 1811   Code Status: Full Code  Family Communication: Daughter present at bedside.  Plan of care discussed with patient in length and he/she verbalized understanding and agreed with it.  Status is: Inpatient Remains inpatient appropriate because: Advancing diet.   Estimated body mass index is 40.52 kg/m as calculated from the following:   Height as of this encounter: 5' 6.93" (1.7 m).   Weight as of this encounter: 117.1 kg.  Pressure Injury 11/27/21 Ischial tuberosity Left Stage 3 -  Full thickness tissue loss. Subcutaneous fat may be visible but bone, tendon or muscle are NOT exposed. S3 2x3 (Active)  11/27/21 0800  Location: Ischial tuberosity  Location Orientation: Left  Staging: Stage 3 -  Full thickness tissue loss. Subcutaneous fat may be visible but bone, tendon or muscle are NOT exposed.  Wound Description (Comments): S3 2x3  Present on Admission: Yes  Dressing Type Foam - Lift dressing to assess site every shift 11/29/21 2002     Pressure Injury 11/27/21 Ischial tuberosity Right Stage 3 -  Full thickness tissue loss. Subcutaneous fat may be visible but bone, tendon or muscle are NOT exposed. S3 1x1 (Active)  11/27/21 0800  Location: Ischial tuberosity  Location Orientation: Right  Staging: Stage 3 -  Full thickness  tissue loss. Subcutaneous fat may be visible but bone, tendon or muscle are NOT exposed.  Wound Description (Comments): S3 1x1  Present on Admission: Yes  Dressing Type Foam - Lift dressing to assess site every shift 11/29/21 2002     Pressure Injury 11/27/21 Buttocks Right Stage 3 -  Full thickness tissue loss. Subcutaneous fat may be visible but bone, tendon or muscle are NOT exposed. 1x1 (Active)  11/27/21 0800  Location: Buttocks  Location Orientation: Right  Staging: Stage 3 -  Full thickness tissue loss. Subcutaneous fat may be visible but bone, tendon or muscle are NOT exposed.  Wound Description (Comments): 1x1  Present on Admission: Yes  Dressing Type Foam - Lift dressing to assess site every shift 11/29/21 2002     Pressure Injury 11/27/21 Sacrum Right;Left Deep Tissue Pressure Injury - Purple or maroon localized area of discolored intact skin or blood-filled blister due to damage of underlying soft tissue from pressure and/or shear. L 9x6   R 11x7 (Active)  11/27/21 0800  Location: Sacrum  Location Orientation: Right;Left  Staging: Deep Tissue Pressure Injury - Purple or maroon localized area of discolored intact skin or blood-filled blister due to damage of underlying soft tissue from pressure and/or shear.  Wound Description (Comments): L 9x6   R 11x7  Present on Admission: Yes  Dressing  Type Foam - Lift dressing to assess site every shift 11/29/21 0740   Nutritional Assessment: Body mass index is 40.52 kg/m.Marland Kitchen Seen by dietician.  I agree with the assessment and plan as outlined below: Nutrition Status: Nutrition Problem: Increased nutrient needs Etiology: acute illness, post-op healing Signs/Symptoms: estimated needs Interventions: Boost Breeze, TPN  . Skin Assessment: I have examined the patient's skin and I agree with the wound assessment as performed by the wound care RN as outlined below: Pressure Injury 11/27/21 Ischial tuberosity Left Stage 3 -  Full thickness  tissue loss. Subcutaneous fat may be visible but bone, tendon or muscle are NOT exposed. S3 2x3 (Active)  11/27/21 0800  Location: Ischial tuberosity  Location Orientation: Left  Staging: Stage 3 -  Full thickness tissue loss. Subcutaneous fat may be visible but bone, tendon or muscle are NOT exposed.  Wound Description (Comments): S3 2x3  Present on Admission: Yes  Dressing Type Foam - Lift dressing to assess site every shift 11/29/21 2002     Pressure Injury 11/27/21 Ischial tuberosity Right Stage 3 -  Full thickness tissue loss. Subcutaneous fat may be visible but bone, tendon or muscle are NOT exposed. S3 1x1 (Active)  11/27/21 0800  Location: Ischial tuberosity  Location Orientation: Right  Staging: Stage 3 -  Full thickness tissue loss. Subcutaneous fat may be visible but bone, tendon or muscle are NOT exposed.  Wound Description (Comments): S3 1x1  Present on Admission: Yes  Dressing Type Foam - Lift dressing to assess site every shift 11/29/21 2002     Pressure Injury 11/27/21 Buttocks Right Stage 3 -  Full thickness tissue loss. Subcutaneous fat may be visible but bone, tendon or muscle are NOT exposed. 1x1 (Active)  11/27/21 0800  Location: Buttocks  Location Orientation: Right  Staging: Stage 3 -  Full thickness tissue loss. Subcutaneous fat may be visible but bone, tendon or muscle are NOT exposed.  Wound Description (Comments): 1x1  Present on Admission: Yes  Dressing Type Foam - Lift dressing to assess site every shift 11/29/21 2002     Pressure Injury 11/27/21 Sacrum Right;Left Deep Tissue Pressure Injury - Purple or maroon localized area of discolored intact skin or blood-filled blister due to damage of underlying soft tissue from pressure and/or shear. L 9x6   R 11x7 (Active)  11/27/21 0800  Location: Sacrum  Location Orientation: Right;Left  Staging: Deep Tissue Pressure Injury - Purple or maroon localized area of discolored intact skin or blood-filled blister due to  damage of underlying soft tissue from pressure and/or shear.  Wound Description (Comments): L 9x6   R 11x7  Present on Admission: Yes  Dressing Type Foam - Lift dressing to assess site every shift 11/29/21 0740    Consultants:  General surgery IR ID PCCM-signed off Procedures:  As above  Antimicrobials:  Anti-infectives (From admission, onward)    Start     Dose/Rate Route Frequency Ordered Stop   11/29/21 0600  vancomycin (VANCOREADY) IVPB 1250 mg/250 mL  Status:  Discontinued        1,250 mg 166.7 mL/hr over 90 Minutes Intravenous Every 24 hours 11/28/21 0759 11/29/21 0727   11/28/21 0500  vancomycin (VANCOCIN) IVPB 1000 mg/200 mL premix  Status:  Discontinued        1,000 mg 200 mL/hr over 60 Minutes Intravenous Every 24 hours 11/27/21 0414 11/28/21 0759   11/27/21 0600  meropenem (MERREM) 1 g in sodium chloride 0.9 % 100 mL IVPB  1 g 200 mL/hr over 30 Minutes Intravenous Every 8 hours 11/27/21 0346     11/27/21 0430  vancomycin (VANCOREADY) IVPB 2000 mg/400 mL        2,000 mg 200 mL/hr over 120 Minutes Intravenous  Once 11/27/21 0346 11/27/21 0655   11/24/21 1400  piperacillin-tazobactam (ZOSYN) IVPB 3.375 g  Status:  Discontinued        3.375 g 12.5 mL/hr over 240 Minutes Intravenous Every 8 hours 11/24/21 1018 11/27/21 0323   11/24/21 1200  Ampicillin-Sulbactam (UNASYN) 3 g in sodium chloride 0.9 % 100 mL IVPB  Status:  Discontinued        3 g 200 mL/hr over 30 Minutes Intravenous Every 6 hours 11/24/21 0900 11/24/21 1018   11/23/21 0830  piperacillin-tazobactam (ZOSYN) IVPB 3.375 g  Status:  Discontinued        3.375 g 12.5 mL/hr over 240 Minutes Intravenous Every 8 hours 11/23/21 0736 11/24/21 0858   11/22/21 1200  Ampicillin-Sulbactam (UNASYN) 3 g in sodium chloride 0.9 % 100 mL IVPB  Status:  Discontinued        3 g 200 mL/hr over 30 Minutes Intravenous Every 6 hours 11/22/21 0923 11/23/21 0736   11/20/21 1645  micafungin (MYCAMINE) 100 mg in sodium chloride  0.9 % 100 mL IVPB        100 mg 105 mL/hr over 1 Hours Intravenous Every 24 hours 11/20/21 1554     11/08/21 0730  vancomycin (VANCOREADY) IVPB 1750 mg/350 mL        1,750 mg 175 mL/hr over 120 Minutes Intravenous  Once 11/08/21 0641 11/08/21 0908   11/08/21 0641  vancomycin variable dose per unstable renal function (pharmacist dosing)  Status:  Discontinued         Does not apply See admin instructions 11/08/21 0641 11/08/21 1138   11/07/21 2000  piperacillin-tazobactam (ZOSYN) IVPB 3.375 g  Status:  Discontinued        3.375 g 12.5 mL/hr over 240 Minutes Intravenous Every 8 hours 11/07/21 1921 11/22/21 0923   11/07/21 1430  piperacillin-tazobactam (ZOSYN) IVPB 3.375 g        3.375 g 100 mL/hr over 30 Minutes Intravenous  Once 11/07/21 1418 11/07/21 1535         Subjective: Seen and examined.  He still has abdominal pain but improving.  No other complaint.  Tolerating clear liquid diet.  Daughter at the bedside.  Objective: Vitals:   11/29/21 1850 11/29/21 2358 11/30/21 0440 11/30/21 0920  BP: (!) 194/86 (!) 149/71 (!) 163/68 (!) 168/92  Pulse: 64 76 64 97  Resp: 20 20 20    Temp: 97.6 F (36.4 C) 98 F (36.7 C) 98.3 F (36.8 C)   TempSrc: Oral Oral Oral   SpO2: 95% 100% 100%   Weight:      Height:        Intake/Output Summary (Last 24 hours) at 11/30/2021 1045 Last data filed at 11/30/2021 0541 Gross per 24 hour  Intake 1783.11 ml  Output 1500 ml  Net 283.11 ml   Filed Weights   11/21/21 0000 11/23/21 1041 11/27/21 0000  Weight: 121.5 kg 121.5 kg 117.1 kg    Examination:  General exam: Appears calm and comfortable, obese Respiratory system: Clear to auscultation. Respiratory effort normal. Cardiovascular system: S1 & S2 heard, RRR. No JVD, murmurs, rubs, gallops or clicks. No pedal edema. Gastrointestinal system: Abdomen is nondistended, soft and generalized tenderness. No organomegaly or masses felt. Normal bowel sounds heard.  Has right as  well as left  abdominal drain in place. Central nervous system: Alert and oriented. No focal neurological deficits. Extremities: Symmetric 5 x 5 power. Skin: No rashes, lesions or ulcers Psychiatry: Judgement and insight appear normal. Mood & affect appropriate.    Data Reviewed: I have personally reviewed following labs and imaging studies  CBC: Recent Labs  Lab 11/26/21 0357 11/27/21 0209 11/28/21 0500 11/29/21 1503 11/30/21 0342  WBC 10.6* 15.7* 13.1* 8.7 9.5  NEUTROABS  --   --   --  6.3  --   HGB 8.8* 9.1* 7.0* 7.9* 8.3*  HCT 28.2* 29.0* 22.3* 25.2* 26.3*  MCV 101.1* 101.0* 101.8* 100.8* 100.4*  PLT 225 167 154 146* 179   Basic Metabolic Panel: Recent Labs  Lab 11/25/21 0455 11/26/21 0357 11/27/21 0209 11/27/21 1030 11/28/21 0500 11/29/21 0535 11/30/21 0342  NA 136 133* 133* 130* 136 135 135  K 4.0 4.3 4.8 4.4 4.1 4.2 4.4  CL 105 103 106 103 106 104 107  CO2 24 21* 17* 19* 24 24 22   GLUCOSE 120* 134* 172* 229* 144* 126* 128*  BUN 24* 25* 32* 36* 38* 31* 26*  CREATININE 1.54* 1.45* 1.81* 1.74* 1.39* 1.13 1.07  CALCIUM 8.3* 8.6* 8.4* 8.0* 8.3* 8.0* 8.4*  MG 1.8 1.8 1.7  --  2.1 2.0  --   PHOS 2.6 2.3* 1.9*  --  2.7 2.4* 2.7   GFR: Estimated Creatinine Clearance: 85.2 mL/min (by C-G formula based on SCr of 1.07 mg/dL). Liver Function Tests: Recent Labs  Lab 11/26/21 0357 11/27/21 0209 11/28/21 0500 11/29/21 0535 11/30/21 0342  AST 19 38 26 34 35  ALT 12 16 14 18 19   ALKPHOS 33* 38 33* 33* 38  BILITOT 0.8 0.9 0.6 0.6 0.7  PROT 6.8 7.0 6.0* 6.4* 6.7  ALBUMIN 2.0* 2.0* 1.6* 1.7* 1.8*   No results for input(s): "LIPASE", "AMYLASE" in the last 168 hours. No results for input(s): "AMMONIA" in the last 168 hours. Coagulation Profile: No results for input(s): "INR", "PROTIME" in the last 168 hours. Cardiac Enzymes: No results for input(s): "CKTOTAL", "CKMB", "CKMBINDEX", "TROPONINI" in the last 168 hours. BNP (last 3 results) No results for input(s): "PROBNP" in the  last 8760 hours. HbA1C: No results for input(s): "HGBA1C" in the last 72 hours. CBG: Recent Labs  Lab 11/29/21 1656 11/29/21 2007 11/29/21 2352 11/30/21 0437 11/30/21 0754  GLUCAP 119* 123* 140* 145* 136*   Lipid Profile: Recent Labs    11/29/21 0535  TRIG 110   Thyroid Function Tests: No results for input(s): "TSH", "T4TOTAL", "FREET4", "T3FREE", "THYROIDAB" in the last 72 hours. Anemia Panel: No results for input(s): "VITAMINB12", "FOLATE", "FERRITIN", "TIBC", "IRON", "RETICCTPCT" in the last 72 hours. Sepsis Labs: Recent Labs  Lab 11/27/21 0209 11/27/21 0456  LATICACIDVEN 3.6* 3.0*    Recent Results (from the past 240 hour(s))  Culture, blood (Routine X 2) w Reflex to ID Panel     Status: None (Preliminary result)   Collection Time: 11/27/21  2:09 AM   Specimen: BLOOD  Result Value Ref Range Status   Specimen Description   Final    BLOOD LEFT ANTECUBITAL Performed at [facility], [address], [place], [place] [address]    Special Requests   Final    BOTTLES DRAWN AEROBIC AND ANAEROBIC Blood Culture adequate volume Performed at [facility], [address], [place], [place] [address]    Culture   Final    NO GROWTH 3 DAYS Performed at  [facility], [address] [place], [place] [address]    Report Status PENDING  Incomplete  Culture, blood (Routine X 2) w Reflex to ID Panel     Status: None (Preliminary result)   Collection Time: 11/27/21  2:11 AM   Specimen: BLOOD  Result Value Ref Range Status   Specimen Description   Final    BLOOD BLOOD LEFT HAND Performed at [facility], [address], [place], [place] [address]    Special Requests   Final    BOTTLES DRAWN AEROBIC AND ANAEROBIC Blood Culture adequate volume Performed at [facility], [address], [place], [place] [address]    Culture   Final    NO GROWTH 3 DAYS Performed at [facility], [address], [place], [place] [address]    Report Status PENDING  Incomplete  Aerobic/Anaerobic Culture w Gram Stain (surgical/deep wound)     Status: None (Preliminary result)   Collection Time: 11/27/21  1:55 PM   Specimen: Abscess  Result Value Ref Range Status   Specimen Description   Final    ABSCESS Performed at [facility], [address], [place], [place] [address]    Special Requests   Final    Normal Performed at [facility], [address], [place], [place] [address]    Gram Stain   Final    ABUNDANT WBC PRESENT, PREDOMINANTLY PMN RARE GRAM NEGATIVE RODS    Culture   Final    FEW ESCHERICHIA COLI CULTURE REINCUBATED FOR BETTER GROWTH HOLDING FOR POSSIBLE ANAEROBE Performed at [facility], [address], [place], [place] [address]    Report Status PENDING  Incomplete   Organism ID, Bacteria ESCHERICHIA COLI  Final      Susceptibility   Escherichia coli - MIC*    AMPICILLIN 4 SENSITIVE Sensitive     CEFAZOLIN <=4 SENSITIVE Sensitive     CEFEPIME <=0.12 SENSITIVE Sensitive     CEFTAZIDIME <=1 SENSITIVE Sensitive     CEFTRIAXONE <=0.25 SENSITIVE Sensitive     CIPROFLOXACIN <=0.25 SENSITIVE Sensitive     GENTAMICIN <=1 SENSITIVE Sensitive     IMIPENEM <=0.25 SENSITIVE Sensitive     TRIMETH/SULFA <=20 SENSITIVE Sensitive     AMPICILLIN/SULBACTAM <=2 SENSITIVE Sensitive     PIP/TAZO <=4 SENSITIVE Sensitive     * FEW ESCHERICHIA COLI     Radiology Studies: No results found.  Scheduled Meds:  amLODipine  5 mg Oral Daily   Chlorhexidine Gluconate Cloth  6 each Topical Daily   docusate sodium  100 mg Oral BID   feeding supplement  1 Container Oral TID BM   heparin injection (subcutaneous)  5,000 Units Subcutaneous Q8H   insulin aspart  0-15 Units Subcutaneous Q4H   lip balm  1 application  Topical BID   mouth rinse  15 mL Mouth Rinse q12n4p   metoprolol succinate  25 mg Oral Daily   pantoprazole  40 mg Oral Daily    polyethylene glycol  17 g Oral Daily   sodium chloride flush  10-40 mL Intracatheter Q12H   sodium chloride flush  5 mL Intracatheter Q8H   Continuous Infusions:  sodium chloride 250 mL (11/23/21 1629)   sodium chloride Stopped (11/09/21 1144)   sodium chloride 10 mL/hr at 11/29/21 1452   sodium chloride     meropenem (MERREM) IV 1 g (11/30/21 0501)   micafungin (MYCAMINE) 100 mg in sodium chloride 0.9 %  100 mL IVPB 100 mg (11/29/21 2020)   ondansetron (ZOFRAN) IV     TPN ADULT (ION) 95 mL/hr at 11/29/21 1736   TPN ADULT (ION)       LOS: 23 days   Hughie Closs, MD [facility]  11/30/2021, 10:45 AM   *Please note that this is a verbal dictation therefore any spelling or grammatical errors are due to the "Dragon [employer] One" system interpretation.  Please page via Amion and do not message via secure chat for urgent patient care matters. Secure chat can be used for non urgent patient care matters.  How to contact the [facility] Attending or Consulting provider 7A - 7P or covering provider during after hours 7P -7A, for this patient?  Check the care team in [facility] and look for a) attending/consulting TR[facility] provider listed and b) the [facility] team listed. Page or secure chat 7A-7P. Log into [email or web address] and use [facility] universal password to access. If you do not have the password, please contact the hospital operator. Locate the [facility] provider you are looking for under [facility] and page to a number that you can be directly reached. If you still have difficulty reaching the provider, please page the [facility] (Director on Call) for the Hospitalists listed on amion for assistance.

## 2021-11-30 NOTE — TOC Progression Note (Signed)
Transition of Care [facility]) - Progression Note    Patient Details  Name: Robert Larson MRN: [id number] Date of Birth: 1955-10-25  Transition of Care [facility]) CM/SW Contact  Golda Acre, RN Phone Number: 11/30/2021, 7:36 AM  Clinical Narrative:    Chart reviewed. Following for toc needs.-hhc   Expected Discharge Plan: Home w Home Health Services Barriers to Discharge: Continued Medical Work up  Expected Discharge Plan and Services Expected Discharge Plan: Home w Home Health Services                                               Social Determinants of Health (SDOH) Interventions    Readmission Risk Interventions     No data to display

## 2021-11-30 NOTE — Progress Notes (Addendum)
PHARMACY - TOTAL PARENTERAL NUTRITION CONSULT NOTE   Indication: Prolonged ileus  Patient Measurements: Height: 5' 6.93" (170 cm) Weight: 117.1 kg (258 lb 2.5 oz) IBW/kg (Calculated) : 65.94 TPN AdjBW (KG): 79.8 Body mass index is 40.52 kg/m. Usual Weight:   Assessment:  66 yo male admitted for sepsis d/t multiple diverticular IAAs. Failed conservative measures and underwent ex lap 6/20. Unable to perform colectomy or diversion d/t extensively frozen bowel. Pharmacy consulted to dose TPN with prolonged ileus.  Glucose / Insulin: Hx DM on PTA metformin, SGLT2, GLP1 - CBGs goal 100 - 180 - 10 units SSI in 24 hrs, on mSSI q4h Electrolytes: WNL Renal: SCr improved (baseline ~1.3, up to 1.74). BUN remains elevated Hepatic: albumin low; LFTs, bili, TG all WNL  - new cirrhosis Dx on imaging this admission Intake / Output; MIVF:  - drain placed by IR 6/24, total output: 200 mL - 24 hr UOP: 1.3 L -I/O Net 774.3 GI Surgeries / Procedures:  - 6/20 Pt post-op exploratory laparotomy and drainage of pelvic abscess. -6/24: CT guided drain placement for LLQ abscess by IR  Central access: 11/08/2021 TPN start date: 11/24/2021  Nutritional Goals: Goal TPN rate is 95 mL/hr (provides 130 g protein and 2366 kcals per day)  RD Assessment: Estimated Needs Total Energy Estimated Needs: 2300-2600 kcal Total Protein Estimated Needs: 115-130 grams Total Fluid Estimated Needs: >/= 2.4 L/day  Current Nutrition:  CLD, TPN at goal rate  Plan:  CLD started 6/26 - continue to follow for advancement/pt tolerance and appropriate taper off TPN Continue TPN at goal rate of 95 mL/hr Electrolytes in TPN: same formulation as yesterday Na 100 mEq/L K 50 mEq/L Ca 2 mEq/L  Mg 5 mEq/L Phos 19 mmol/L Cl:Ac ratio 1:1 Add standard MVI and trace elements to TPN Continue mSSI q4h Monitor TPN labs on Mon/Thurs. CMP & phos in am  Lynden Ang, PharmD, BCPS Clinical Pharmacist 11/30/2021 7:29 AM

## 2021-11-30 NOTE — Progress Notes (Signed)
Referring Physician(s): Darnelle Spangle  Supervising Physician: Malachy Moan  Patient Status:  [facility] - In-pt  Chief Complaint: Intra-abdominal abscess s/p LLQ drain placement by Dr Fredia Sorrow 6/24   Subjective: Pt up on bedside commode; just had bath; has some mid and LLQ abd discomfort   Allergies: Patient has no known allergies.  Medications: Prior to Admission medications   Medication Sig Start Date End Date Taking? Authorizing Provider  FARXIGA 10 MG TABS tablet Take 10 mg by mouth daily.   Yes [provider]  metoprolol succinate (TOPROL-XL) 25 MG 24 hr tablet Take 1 tablet (25 mg total) by mouth at bedtime. START with 1/2 pill for one week, then increase to 1 full pill. Patient taking differently: Take 25 mg by mouth at bedtime. 10/18/21  Yes Dulce Sellar, NP  dapagliflozin propanediol (FARXIGA) 5 MG TABS tablet Take 1 tablet (5 mg total) by mouth daily. Patient not taking: Reported on 11/07/2021 10/14/21   Dulce Sellar, NP  Dulaglutide (TRULICITY) 1.5 MG/0.5ML SOPN Inject 1.5 mg into the skin once a week. Start after you run out of the 0.75mg  dose x 2 each week. Patient not taking: Reported on 11/07/2021 10/14/21   Dulce Sellar, NP  metFORMIN (GLUCOPHAGE-XR) 500 MG 24 hr tablet TAKE 1 TABLET BY MOUTH EVERY DAY WITH BREAKFAST Patient not taking: Reported on 11/07/2021 10/14/21   Dulce Sellar, NP  Omega-3 Fatty Acids (FISH OIL PO) Take 2 capsules by mouth daily. Patient not taking: Reported on 11/07/2021    [provider]  telmisartan-hydrochlorothiazide (MICARDIS HCT) 80-25 MG tablet Take 1 tablet by mouth daily. Patient not taking: Reported on 11/07/2021    [provider]  varenicline (CHANTIX) 0.5 MG tablet INCREASE TO 2 TABS IN AM, 1 TAB IN PM FOR 1-2 WEEKS, THEN 2 TABS AM AND 2 TABS PM. 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 Status post laparotomy 4 days ago with placement of surgical drain in residual pelvic fluid collection. The patient has become more critically ill with CT demonstrating loculated extraluminal air in the left lower quadrant. EXAM: CT GUIDED CATHETER DRAINAGE OF LEFT LOWER QUADRANT PERITONEAL ABSCESS ANESTHESIA/SEDATION: The patient is intubated, on pressors and was not given additional conscious sedation during the procedure. PROCEDURE: The procedure, risks, benefits, and alternatives were explained to the patient's daughter. Questions regarding the procedure were encouraged and answered. The patient's daughter understands and consents to the procedure. A time out was performed prior to initiating the procedure. CT of the lower abdomen and pelvis was performed in a supine position. The left lower abdominal wall was prepped with chlorhexidine in a sterile fashion, and a sterile drape was applied covering the operative field. A sterile gown and sterile gloves were used for the procedure. Local anesthesia was provided with 1% Lidocaine. Under CT guidance, an 18 gauge trocar needle was advanced into a left lower quadrant abscess. After confirming needle tip position, a  guidewire was advanced into the collection and the needle removed. The percutaneous tract was dilated over the wire. A 12 French percutaneous drainage catheter was advanced over the wire and formed. Drainage catheter position was confirmed by CT. A fluid sample was aspirated from the drain and sent for culture analysis. The drainage catheter was  connected to suction bulb drainage. The percutaneous drain was secured at the skin with a Prolene retention suture and StatLock device. A dressing was applied over the catheter exit site. RADIATION DOSE REDUCTION: This exam was performed according to the departmental dose-optimization program which includes automated exposure control, adjustment of the mA and/or kV according to patient size and/or use of iterative reconstruction technique. COMPLICATIONS: None FINDINGS: At the time of the drainage procedure, some of the collection that had previously contained only loculated air in the anterior left lower peritoneal cavity now also contains fluid and an elongated air-fluid level. After drainage catheter placement within this collection, there is return of purulent, brownish colored liquid. A sample was sent for culture analysis. There is good return of fluid as well as some air from the drain after placement. IMPRESSION: CT-guided percutaneous catheter drainage of left lower quadrant peritoneal abscess with return of air as well as brownish colored purulent fluid. A sample of fluid was sent for culture analysis. A 12 French drain was placed and attached to suction bulb drainage. Electronically Signed   By: Irish Lack M.D.   On: 11/27/2021 14:43   DG CHEST PORT 1 VIEW  Result Date: 11/27/2021 CLINICAL DATA:  OG tube placement. EXAM: PORTABLE CHEST 1 VIEW COMPARISON:  11/27/2021. FINDINGS: The heart is enlarged the mediastinal contour is stable. The pulmonary vasculature is prominent. Lung volumes are low. Mild residual atelectasis is noted at the left lung base, improved from the prior exam. There is a small left pleural effusion. No pneumothorax. The right PICC line is stable. An enteric tube terminates in the stomach. The endotracheal tube terminates 4.9 cm above the carina. IMPRESSION: 1. Cardiomegaly with pulmonary vascular congestion. 2. Mild atelectasis at the left lung base. 3. Small left pleural  effusion. 4. Lines and tubes as described above. Electronically Signed   By: Thornell Sartorius M.D.   On: 11/27/2021 04:26   CT HEAD WO CONTRAST ( )  Result Date: 11/27/2021 CLINICAL DATA:  Delirium, altered mental status EXAM: CT HEAD WITHOUT CONTRAST TECHNIQUE: Contiguous axial images were obtained from the base of the skull through the vertex without intravenous contrast. RADIATION DOSE REDUCTION: This exam was performed according to the departmental dose-optimization program which includes automated exposure control, adjustment of the mA and/or kV according to patient size and/or use of iterative reconstruction technique. COMPARISON:  None Available. FINDINGS: Brain: Normal anatomic configuration. No abnormal intra or extra-axial mass lesion or fluid collection. No abnormal mass effect or midline shift. No evidence of acute intracranial hemorrhage or infarct. Ventricular size is normal. Cerebellum unremarkable. Vascular: Unremarkable Skull: Intact Sinuses/Orbits: Paranasal sinuses are clear. Orbits are unremarkable. Other: Mastoid air cells and middle ear cavities are clear. IMPRESSION: No acute intracranial abnormality. Electronically Signed   By: Helyn Numbers M.D.   On: 11/27/2021 03:05   CT ABDOMEN PELVIS WO CONTRAST  Result Date: 11/27/2021 CLINICAL DATA:  Sepsis, altered mental status EXAM: CT ABDOMEN AND PELVIS WITHOUT CONTRAST TECHNIQUE: Multidetector CT imaging of the abdomen and pelvis was performed following the standard protocol without IV contrast. RADIATION DOSE REDUCTION: This exam was performed according to the departmental dose-optimization program which includes automated exposure  control, adjustment of the mA and/or kV according to patient size and/or use of iterative reconstruction technique. COMPARISON:  11/22/2021 FINDINGS: Lower chest: Small left pleural effusion with associated left lower lobe collapse and consolidation is unchanged. Progressive ground-glass pulmonary infiltrate  within the lingula and right middle lobe, likely infectious or inflammatory in nature. Cardiac size within normal limits. No pericardial effusion. Hepatobiliary: Previously noted intrahepatic hypoenhancing mass is not well appreciated on this noncontrast examination. The liver contour is mildly nodular and there is hypertrophy of the left hepatic lobe in keeping with changes of cirrhosis. No intra or extrahepatic biliary ductal dilation. Gallbladder unremarkable. Pancreas: Unremarkable Spleen: Unremarkable Adrenals/Urinary Tract: 1.5 cm right adrenal nodule and 2.5 cm left adrenal nodules are stable sore indeterminate with medius 10 UA shin of 27-29 Hounsfield units. The kidneys are normal in size and position. Multiple simple cortical cysts are again identified bilaterally, better appreciated on prior examination. No further follow-up is recommended for these lesions. No hydronephrosis. No intrarenal or ureteral calculi. Foley catheter balloon is seen within a decompressed bladder lumen. Stomach/Bowel: Interval development of a moderate free intraperitoneal gas, possibly postsurgical in nature. Laparotomy incision now identified with wound VAC in place superiorly. Deep abdominal fascia appears intact though there is superficial dehiscence of the laparotomy incision. Right lower quadrant Blake drain is seen looped within the pelvis. Small amount of free intraperitoneal fluid is seen within the pelvis. Previously noted right lower quadrant pigtail drainage catheter has been removed. A a loculated gas collection is seen along the right lateral aspect of the abdomen extending into the deep pelvis approaching the [employer] drain. This measures at least 5.3 x 9.9 by 21 cm in greatest dimension. No significant associated fluid component is identified with this collection. A small loculated curvilinear gas and fluid collection is identified within the deep pelvis measuring roughly 5.7 x 6.1 cm at axial image # 66/3. The  stomach, small bowel, and large bowel are otherwise unremarkable. Appendix normal. Vascular/Lymphatic: Moderate aortoiliac atherosclerotic calcification. No aortic aneurysm. No pathologic adenopathy within the abdomen and pelvis. Reproductive: Prostate is unremarkable. Other: Stable subcutaneous edema within the flanks bilaterally in keeping with mild anasarca. Musculoskeletal: Left total hip arthroplasty has been performed. Degenerative changes are seen within the lumbar spine. No acute bone abnormality. IMPRESSION: 1. Interval development of moderate free intraperitoneal gas, likely postsurgical in nature. 2. Interval development of a large loculated gas collection within the right lateral abdomen extending into the deep pelvis approaching the [facility] drain. This measures at least 5.3 x 9.9 x 21 cm in greatest dimension. No significant associated fluid component is identified with this collection. 3. Small loculated gas and fluid collection within the deep pelvis measuring 6.1 cm in greatest dimension. 4. Interval development of ground-glass pulmonary infiltrate within the lingula and right middle lobe, likely infectious or inflammatory in nature. 5. Stable small left pleural effusion with associated left lower lobe collapse and consolidation. 6. Morphologic changes in keeping with cirrhosis. Previously noted intrahepatic mass is not well appreciated on this noncontrast examination. 7. Stable indeterminate bilateral adrenal nodules. Electronically Signed   By: Helyn Numbers M.D.   On: 11/27/2021 03:01   DG CHEST PORT 1 VIEW  Result Date: 11/27/2021 CLINICAL DATA:  Difficulty breathing EXAM: PORTABLE CHEST 1 VIEW COMPARISON:  11/10/2021 FINDINGS: Right-sided PICC is noted in satisfactory position. Cardiac shadow is enlarged but stable. Increased vascular congestion is noted similar to that seen on the prior exam. No focal confluent infiltrate or sizable effusion is noted.  Mild left basilar atelectasis is seen.  IMPRESSION: Vascular congestion without significant edema. Mild left basilar atelectasis. Electronically Signed   By: Alcide Clever M.D.   On: 11/27/2021 01:50    Labs:  CBC: Recent Labs    11/27/21 0209 11/28/21 0500 11/29/21 1503 11/30/21 0342  WBC 15.7* 13.1* 8.7 9.5  HGB 9.1* 7.0* 7.9* 8.3*  HCT 29.0* 22.3* 25.2* 26.3*  PLT 167 154 146* 179    COAGS: Recent Labs    11/08/21 0822 11/09/21 0616 11/12/21 1327  INR 1.6* 2.1* 1.3*    BMP: Recent Labs    11/27/21 1030 11/28/21 0500 11/29/21 0535 11/30/21 0342  NA 130* 136 135 135  K 4.4 4.1 4.2 4.4  CL 103 106 104 107  CO2 19* 24 24 22   GLUCOSE 229* 144* 126* 128*  BUN 36* 38* 31* 26*  CALCIUM 8.0* 8.3* 8.0* 8.4*  CREATININE 1.74* 1.39* 1.13 1.07  GFRNONAA 43* 57* >60 >60    LIVER FUNCTION TESTS: Recent Labs    11/27/21 0209 11/28/21 0500 11/29/21 0535 11/30/21 0342  BILITOT 0.9 0.6 0.6 0.7  AST 38 26 34 35  ALT 16 14 18 19   ALKPHOS 38 33* 33* 38  PROT 7.0 6.0* 6.4* 6.7  ALBUMIN 2.0* 1.6* 1.7* 1.8*    Assessment and Plan: 66 yo male known to IR from previous image-guided drain placements which were intra-operatively removed during exploratory laparotomy on 6/20. Pt subsequently developed AMS, tachycardia, tachypnea, and leukocytosis. Patient was transferred to the ICU and Dr Fredia Sorrow was consulted for a recurrent abscess. A LLQ drain was placed on 6/24; now on regular floor; afebrile; WBC nl, hgb stable, creat nl; drain fl cx- few e coli  Drain Location: LLQ Size: Fr size: 12 Fr Date of placement: 11/27/21  Currently to: Drain collection device: suction bulb 24 hour output:  Output by Drain (mL) 11/28/21 0701 - 11/28/21 1900 11/28/21 1901 - 11/29/21 0700 11/29/21 0701 - 11/29/21 1900 11/29/21 1901 - 11/30/21 0700 11/30/21 0701 - 11/30/21 1051  Closed System Drain 1 Right;Medial RLQ Bulb (JP) 19 Fr. 20  20 30    Closed System Drain 1 Left;Anterior Abdomen Bulb (JP) 12 Fr. 110 60 45 80   Negative  Pressure Wound Therapy Abdomen Medial   25        Current examination:  Insertion site unremarkable. Suture and stat lock in place. Dressed appropriately.   Plan: Continue TID flushes with 5 cc NS. Record output Q shift. Dressing changes QD or PRN if soiled.  Call IR APP or on call IR MD if difficulty flushing or sudden change in drain output.  Repeat imaging/possible drain injection once output < 10 mL/QD (excluding flush material). Consideration for drain removal if output is < 10 mL/QD (excluding flush material), pending discussion with the providing surgical service.  Discharge planning: Please contact IR APP or on call IR MD prior to patient d/c to ensure appropriate follow up plans are in place. Typically patient will follow up with IR clinic 10-14 days post d/c for repeat imaging/possible drain injection. IR scheduler will contact patient with date/time of appointment. Patient will need to flush drain QD with 5 cc NS, record output QD, dressing changes every 2-3 days or earlier if soiled.   IR will continue to follow - please call with questions or concerns.      Electronically Signed: D. Jeananne Rama, PA-C 11/30/2021, 10:45 AM   I spent a total of 15 Minutes at the the patient's bedside AND  on the patient's hospital floor or unit, greater than 50% of which was counseling/coordinating care for left lower abdominal abscess drain    Patient ID: Robert Larson, male   DOB: [birth year], 66 y.o.   MRN: [id number]

## 2021-12-01 DIAGNOSIS — K651 Peritoneal abscess: Secondary | ICD-10-CM | POA: Diagnosis not present

## 2021-12-01 LAB — COMPREHENSIVE METABOLIC PANEL
ALT: 18 U/L (ref 0–44)
AST: 29 U/L (ref 15–41)
Albumin: 1.8 g/dL — ABNORMAL LOW (ref 3.5–5.0)
Alkaline Phosphatase: 39 U/L (ref 38–126)
Anion gap: 6 (ref 5–15)
BUN: 25 mg/dL — ABNORMAL HIGH (ref 8–23)
CO2: 23 mmol/L (ref 22–32)
Calcium: 8.7 mg/dL — ABNORMAL LOW (ref 8.9–10.3)
Chloride: 107 mmol/L (ref 98–111)
Creatinine, Ser: 0.95 mg/dL (ref 0.61–1.24)
GFR, Estimated: >60 mL/min (ref >60)
Glucose, Bld: 119 mg/dL — ABNORMAL HIGH (ref 70–99)
Potassium: 4.9 mmol/L (ref 3.5–5.1)
Sodium: 136 mmol/L (ref 135–145)
Total Bilirubin: 0.5 mg/dL (ref 0.3–1.2)
Total Protein: 6.9 g/dL (ref 6.5–8.1)

## 2021-12-01 LAB — AEROBIC/ANAEROBIC CULTURE W GRAM STAIN (SURGICAL/DEEP WOUND): Special Requests: NORMAL

## 2021-12-01 LAB — CBC
HCT: 22.5 % — ABNORMAL LOW (ref 39.0–52.0)
HCT: 26.9 % — ABNORMAL LOW (ref 39.0–52.0)
Hemoglobin: 7 g/dL — ABNORMAL LOW (ref 13.0–17.0)
Hemoglobin: 8.4 g/dL — ABNORMAL LOW (ref 13.0–17.0)
MCH: 32 pg (ref 26.0–34.0)
MCH: 31.9 pg (ref 26.0–34.0)
MCHC: 31.1 g/dL (ref 30.0–36.0)
MCHC: 31.2 g/dL (ref 30.0–36.0)
MCV: 102.7 fL — ABNORMAL HIGH (ref 80.0–100.0)
MCV: 102.3 fL — ABNORMAL HIGH (ref 80.0–100.0)
Platelets: 165 10*3/uL (ref 150–400)
Platelets: 198 10*3/uL (ref 150–400)
RBC: 2.19 MIL/uL — ABNORMAL LOW (ref 4.22–5.81)
RBC: 2.63 MIL/uL — ABNORMAL LOW (ref 4.22–5.81)
RDW: 15.9 % — ABNORMAL HIGH (ref 11.5–15.5)
RDW: 15.9 % — ABNORMAL HIGH (ref 11.5–15.5)
WBC: 8.1 10*3/uL (ref 4.0–10.5)
WBC: 9.1 10*3/uL (ref 4.0–10.5)
nRBC: 0 % (ref 0.0–0.2)
nRBC: 0 % (ref 0.0–0.2)

## 2021-12-01 LAB — GLUCOSE, CAPILLARY
Glucose-Capillary: 121 mg/dL — ABNORMAL HIGH (ref 70–99)
Glucose-Capillary: 120 mg/dL — ABNORMAL HIGH (ref 70–99)
Glucose-Capillary: 122 mg/dL — ABNORMAL HIGH (ref 70–99)
Glucose-Capillary: 163 mg/dL — ABNORMAL HIGH (ref 70–99)

## 2021-12-01 LAB — PHOSPHORUS: Phosphorus: 3.3 mg/dL (ref 2.5–4.6)

## 2021-12-01 MED ORDER — TRAVASOL 10 % IV SOLN
INTRAVENOUS | Status: AC
  Filled 2021-12-01: qty 1299.6

## 2021-12-01 MED ORDER — INSULIN ASPART 100 UNIT/ML IJ SOLN
0.0000 [IU] | Freq: Four times a day (QID) | INTRAMUSCULAR | Status: DC
  Administered 2021-12-01: 3 [IU] via SUBCUTANEOUS
  Administered 2021-12-01 – 2021-12-05 (×8): 2 [IU] via SUBCUTANEOUS

## 2021-12-01 MED ORDER — JUVEN PO PACK
1.0000 | PACK | Freq: Two times a day (BID) | ORAL | Status: DC
  Administered 2021-12-01 – 2021-12-02 (×3): 1 via ORAL
  Filled 2021-12-01 (×6): qty 1

## 2021-12-01 MED ORDER — AMLODIPINE BESYLATE 10 MG PO TABS
10.0000 mg | ORAL_TABLET | Freq: Every day | ORAL | Status: DC
  Administered 2021-12-01 – 2021-12-07 (×7): 10 mg via ORAL
  Filled 2021-12-01 (×7): qty 1

## 2021-12-01 MED ORDER — BOOST / RESOURCE BREEZE PO LIQD CUSTOM
1.0000 | ORAL | Status: DC
  Administered 2021-12-02 – 2021-12-04 (×3): 1 via ORAL

## 2021-12-01 MED ORDER — MIRTAZAPINE 15 MG PO TABS
7.5000 mg | ORAL_TABLET | Freq: Every day | ORAL | Status: DC
  Administered 2021-12-01 – 2021-12-06 (×6): 7.5 mg via ORAL
  Filled 2021-12-01 (×6): qty 1

## 2021-12-01 MED ORDER — ENSURE MAX PROTEIN PO LIQD
11.0000 [oz_av] | Freq: Every day | ORAL | Status: DC
  Administered 2021-12-01: 11 [oz_av] via ORAL
  Filled 2021-12-01 (×3): qty 330

## 2021-12-01 NOTE — Progress Notes (Signed)
PT Cancellation Note  Patient Details Name: Robert Larson MRN: [id number] DOB: 12/24/1955   Cancelled Treatment:    Reason Eval/Treat Not Completed: Patient declined, no reason specified (Pt c/o fatigue and pain after busy morning and mobilizing in room with assist from daughter. pt declined to participate despite encouragement. Will follow up at later date/time as schedule allows.)   [place], DPT Acute Rehabilitation Services Office [phone] Pager [phone]  12/01/21 12:50 PM

## 2021-12-01 NOTE — Discharge Instructions (Addendum)
[facility]      [facility] [phone]  OPEN ABDOMINAL SURGERY: POST OP INSTRUCTIONS  Always review your discharge instruction sheet given to you by the facility where your surgery was performed.  IF YOU HAVE DISABILITY OR FAMILY LEAVE FORMS, YOU MUST BRING THEM TO THE OFFICE FOR PROCESSING.  PLEASE DO NOT GIVE THEM TO YOUR DOCTOR.  A prescription for pain medication may be given to you upon discharge.  Take your pain medication as prescribed, if needed.  If narcotic pain medicine is not needed, then you may take acetaminophen (Tylenol) or ibuprofen (Advil) as needed. Take your usually prescribed medications unless otherwise directed. If you need a refill on your pain medication, please contact your pharmacy. They will contact our office to request authorization.  Prescriptions will not be filled after 5pm or on week-ends. You should follow a light diet the first few days after arrival home, such as soup and crackers, pudding, etc.unless your doctor has advised otherwise. A high-fiber, low fat diet can be resumed as tolerated.   Be sure to include lots of fluids daily. Most patients will experience some swelling and bruising on the chest and neck area.  Ice packs will help.  Swelling and bruising can take several days to resolve Most patients will experience some swelling and bruising in the area of the incision. Ice pack will help. Swelling and bruising can take several days to resolve..  It is common to experience some constipation if taking pain medication after surgery.  Increasing fluid intake and taking a stool softener will usually help or prevent this problem from occurring.  A mild laxative (Milk of Magnesia or Miralax) should be taken according to package directions if there are no bowel movements after 48 hours.  You may have steri-strips (small skin tapes) in place directly over the incision.  These strips should be left on the skin for 7-10 days.  If your surgeon used skin  glue on the incision, you may shower in 24 hours.  The glue will flake off over the next 2-3 weeks.  Any sutures or staples will be removed at the office during your follow-up visit. You may find that a light gauze bandage over your incision may keep your staples from being rubbed or pulled. You may shower and replace the bandage daily. ACTIVITIES:  You may resume regular (light) daily activities beginning the next day--such as daily self-care, walking, climbing stairs--gradually increasing activities as tolerated.  You may have sexual intercourse when it is comfortable.  Refrain from any heavy lifting or straining until approved by your doctor. You may drive when you no longer are taking prescription pain medication, you can comfortably wear a seatbelt, and you can safely maneuver your car and apply brakes Return to Work: ___________________________________ You should see your doctor in the office for a follow-up appointment approximately two weeks after your surgery.  Make sure that you call for this appointment within a day or two after you arrive home to insure a convenient appointment time. OTHER INSTRUCTIONS:  _____________________________________________________________ _____________________________________________________________  WHEN TO CALL YOUR DOCTOR: Fever over 101.0 Inability to urinate Nausea and/or vomiting Extreme swelling or bruising Continued bleeding from incision. Increased pain, redness, or drainage from the incision. Difficulty swallowing or breathing Muscle cramping or spasms. Numbness or tingling in hands or feet or around lips.  The clinic staff is available to answer your questions during regular business hours.  Please don't hesitate to call and ask to speak to one of   the nurses if you have concerns.  For further questions, please visit [email or web address]     Low Fiber Nutrition Therapy   You may need a low-fiber diet if you have Crohn's disease,  diverticulitis, gastroparesis, ulcerative colitis, a new colostomy, or new ileostomy. A low-fiber diet may also be needed following radiation therapy to the pelvis and lower bowel or recent intestinal surgery.  A low-fiber diet reduces the frequency and volume of your stools. This lessens irritation to the gastrointestinal (GI) tract and can help you heal. Use this diet if you have a stricture so your intestine doesn't get blocked. The goal of this diet is to get less than 8 grams of fiber daily. It's also important to eat enough protein foods while you are on a low-fiber diet.  Drink nutrition supplements that have 1 gram of fiber or less in each serving. If your stricture is severe or if your inflammation is severe, drink more liquids to reduce symptoms and to get enough calories and protein.  Tips Eat about 5 to 6 small meals daily or about every 3 to 4 hours. Do not skip meals.  Every time you eat, include a small amount of protein (1 to 2 ounces) plus an additional food. Low fiber starch foods are the best choice to eat with protein.  Limit acidic, spicy and high-fat or fried and greasy foods to reduce GI symptoms.  Do not eat raw fruits and vegetables while on this diet. All fruits and vegetables need to be cooked and without peels or skins.  Drink a lot of fluids, at least 8 cups of fluid each day. Limit drinks with caffeine, sugar, and sugar substitutes.  Plain water is the best choice. Avoid mixing drink packets or flavor drops into water. .  Take a chewable multivitamin with minerals. Gummy vitamins do not have enough minerals and can block an ostomy and non-chewable supplements are not easily digested. Chewable supplements must be used if you have a stricture or ostomy.  If you are lactose intolerant, you may need to eat low-lactose dairy products. If you can't tolerate dairy, ask your RDN about how you can get enough calcium from other foods.  Do not take a calcium supplement. They can  cause a blockage.  It is important to add high-calcium foods gradually to your diet and monitor for symptoms to avoid a blockage.  Do not add more fiber to your diet until your health care provider or registered dietitian nutritionist (RDN) tells you it's OK. Fiber is part of whole grains, fruits and vegetables (foods from plants) and needs to be slowly added back in to your diet when your body is healed.  Choose foods that have been safely handled and prepared to lower your risk of foodborne illness. Talk to your RDN or see the Food Safety Nutrition Therapy handout for more information.   Foods Recommended These foods are low in fat and fiber and will help with your GI symptoms. Food Group Foods Recommended  Grains  Choose grain foods with less than 2 grams of fiber per serving. Refined white flour products--for example, enriched white bread without seeds, crackers or pasta Cream of wheat or rice Grits (fine ground) Tortillas: white flour or corn White rice, well-cooked (do not rinse, or soak before cooking) Cold and hot cereals made from white or refined flour such as puffed rice or corn flakes  Protein Foods  Lean, very tender, well-cooked poultry or fish; red meats: beef, pork or lamb (slow cook  until soft; chop meats if you have stricture or ostomy) Eggs, well-cooked Smooth nut butters such as almond, peanut, or sunflower Tofu  Dairy  If you have lactose intolerance, drinking milk products from cows or goats may make diarrhea worse. Foods marked with an asterisk (*) have lactose. Milk: fat-free, 1% or 2% * (choose best tolerated) Lactose-free milk Buttermilk* Fortified non-dairy milks: almond, cashew, coconut, or rice (be aware that these options are not good sources of protein so you will need to eat an additional protein food) Kefir* (Don't include kefir in the diet until approved by your health care provider) Yogurt*/lactose-free yogurt (without nuts, fruit, granola or  chocolate) Mild cheese* (hard and aged cheeses tend to be lower in lactose such as cheddar, swiss or parmesan) Cottage cheese* or lactose-free cottage cheese Low-fat ice cream* or lactose-free ice cream Sherbet* (usually lower lactose)  Vegetables  Canned and well-cooked vegetables without seeds, skins, or hulls  Carrots or green beans, cooked White, red or yellow potatoes without skins Strained vegetable juice  Fruit Soft, and well-cooked fruits without skins, seeds, or membranes Canned fruit in juice: peaches, pears, or applesauce Fruit juice without pulp diluted by half with water may be tolerated better Fruit drinks fortified with vitamin C may be tolerated better than 100% fruit juice  Oils  When possible, choose healthy oils and fats, such as olive and canola oils, plant oils rather than solid fats.  Other  Broth and strained soups made from allowed foods Desserts (small portions) without whole grains, seeds, nuts, raisins, or coconut Jelly (clear)   Foods Not Recommended These foods are higher in fat and fiber and may make your GI symptoms worse.  Food Group Foods Not Recommended  Grains  Bread, whole wheat or with whole grain flour or seeds or nuts Brown rice, quinoa, kasha, barley Tortillas: whole grain Whole wheat pasta Whole grain and high-fiber cereals, including oatmeal, bran flakes or shredded wheat Popcorn  Protein Foods  Steak, pork chops, or other meats that are fatty or have gristle Fried meat, poultry, or fish Seafood with a tough or rubbery texture, such as shrimp Luncheon meats such as bologna and salami Sausage, bacon, or hot dogs Dried beans, peas, or lentils Hummus Sushi Nuts and chunky nut butters  Dairy  Whole milk Pea milk and soymilk (may cause diarrhea, gas, bloating, and abdominal pain) Cream Half-and-half Sour cream Yogurt with added fruit, nuts, or granola or chocolate  Vegetables  Alfalfa or bean sprouts (high fiber and risk for  bacteria) Raw or undercooked vegetables: beets; broccoli; brussels sprouts; cabbage; cauliflower; collard, mustard, or turnip greens; corn; cucumber; green peas or any kind of peas; kale; lima beans; mushrooms; okra; olives; pickles and relish; onions; parsnips; peppers; potato skins; sauerkraut; spinach; tomatoes  Fruit Raw fruit Dried fruit Avocado, berries, coconut Canned fruit in syrup Canned fruit with mandarin oranges, papaya or pineapple Fruit juice with pulp Prune juice Fruit skin  Oils  Pork rinds   Low-Fiber (8 grams) Sample 1-Day Menu  Breakfast  cup cream of wheat (0.5 gram fiber)  1 slice white toast (1 gram fiber)  1 teaspoon margarine, soft tub  2 scrambled eggs   Morning Snack 1 cup lactose-free nutrition supplement  Lunch 2 slices white bread (2 grams fiber)  3 tablespoons tuna  1 tablespoon mayonnaise  1 cup chicken noodle soup (1 gram fiber)   cup apple juice   Afternoon Snack 6 saltine crackers (0.5 gram fiber)  2 ounces low-fat cheddar cheese  Evening Meal 3 ounces tender chicken breast  1 cup white rice (0.5 gram fiber)   cup cooked canned green beans (2 grams fiber)   cup cranberry juice   Evening Snack 1 cup lactose-free nutrition supplement   Copyright 2020  Academy of Nutrition and Dietetics  High Fiber Nutrition Therapy  Fiber and fluid may help you feel less constipated and bloated and can also help ease diarrhea. Increase fiber slowly over the course of a few weeks. This will keep your symptoms from getting worse. Tips Tips for Adding Fiber to Your Eating Plan Slowly increase the amount of fiber you eat to 25 to 35 grams per day. Eat whole grain breads and cereals. Look for choices with 100% whole wheat, rye, oats, or bran as the first or second ingredient. Have brown or wild rice instead of white rice or potatoes. Enjoy a variety of grains. Good choices include barley, oats, farro, kamut, and quinoa. Bake with whole wheat flour. You can use  it to replace some white or all-purpose flour in recipes. Enjoy baked beans more often! Add dried beans and peas to casseroles or soups. Choose fresh fruit and vegetables instead of juices. Eat fruits and vegetables with peels or skins on. Compare food labels of similar foods to find higher fiber choices. On packaged foods, the amount of fiber per serving is listed on the Nutrition Facts label. Check the Nutrition Facts labels and try to choose products with at least 4 g dietary fiber per serving. Drink plenty of fluids. Set a goal of at least 8 cups per day. You may need even more fluid as you eat higher amounts of fiber. Fluid helps your body process fiber without discomfort. Foods Recommended Foods With at Least 4 g Fiber per Serving Food Group Choose  Grains ?- cup high-fiber cereal  Dried beans and peas  cup cooked red beans, kidney beans, large lima beans, navy beans, pinto beans, white beans, lentils, or black-eyed peas  Vegetables 1 artichoke (cooked)  Fruits  cup blackberries or raspberries 4 dried prunes   Foods With 1 to 3 g Fiber per Serving Food Group Choose  Grains 1 bagel (6.9-SWNI diameter) 1 slice whole wheat, cracked wheat, pumpernickel, or rye bread 2-inch square cornbread 4 whole wheat crackers 1 bran, blueberry, cornmeal, or English muffin  cup cereal with 1-3 g fiber per serving (check dietary fiber on the product's Nutrition Facts label) 2 tablespoons wheat germ or whole wheat flour  Fruits 1 apple (3-inch diameter) or  cup applesauce  cup apricots (canned) 1 banana  cup cherries (canned or fresh)  cup cranberries (fresh) 3 dates 2 medium figs (fresh)  cup fruit cocktail (canned)  grapefruit 1 kiwi fruit 1 orange (2-inch diameter) 1 peach (fresh) or  cup peaches (canned) 1 pear (fresh) or  cup pears (canned) 1 plum (2-inch diameter)  cup raisins  cup strawberries (fresh) 1 tangerine  Vegetables  cup bean sprouts (raw)  cup beets  (diced, canned)  cup broccoli, brussels sprouts, or cabbage  (cooked)  cup carrots  cup cauliflower  cup corn  cup eggplant  cup okra (boiled)  cup potatoes (baked or mashed)  cup spinach, kale, or turnip greens (cooked)  cup squash--winter, summer, or zucchini (cooked)  cup sweet potatoes or yams  cup tomatoes (canned)  Other 2 tablespoons almonds or peanuts 1 cup popcorn (popped)   High Fiber Vegetarian (Lacto-Ovo) Sample 1-Day Menu  Breakfast  cup bran cereal  1 banana  cup blueberries 1  cup 1% milk  Lunch 2 slices whole wheat bread  2 tablespoons hummus 1 ounce cheddar cheese 1 leaf lettuce 2 slices tomato  cup vegetarian baked beans 1 orange 1 cup 1% milk  Evening Meal Stir fry made with:  cup tempeh   cup brown rice 1 cup frozen broccoli 1 tablespoon soy sauce  cup peanuts  1 pear  Evening Snack 6 ounces fruit yogurt  1 cup air popped popcorn  High Fiber Vegan Sample 1-Day Menu  Breakfast  cup bran cereal  1 banana  cup blueberries 1 cup soymilk fortified with calcium, vitamin B12, and vitamin D  Lunch  cup chili with beans with:   cup tempeh crumbles  cup crushed whole wheat crackers 1 apple 1 cup soymilk fortified with calcium, vitamin B12, and vitamin D  Evening Meal 1 veggie burger  1 whole wheat bun  1 leaf lettuce  1 slice tomato Salad made with: 1 cup lettuce  cup chickpeas   cucumbers 1 tablespoon italian dressing 1 cup strawberries   Evening Snack  cup almonds  1 cup carrot sticks  High Fiber Sample 1-Day Menu  Breakfast 1/2 cup orange juice, with pulp  1/2 cup raisin bran 1 cup fat-free milk 1 cup coffee  Morning Snack 1 cup plain yogurt  2 cups water  Lunch 1 1/2 cups chili  1/2 cup kidney beans 1/2 cup soy crumble 2 tablespoons shredded cheese 8 whole wheat crackers 1 apple (with skin)   Evening Meal 2 ounces sliced chicken  1/4 cup tofu 2 cups mixed fresh vegetables 1 cup brown rice 1/2 cup  strawberries 1 cup hot tea  Evening Snack 2 tablespoons almonds  1 cup hot chocolate   Copyright 2020  Academy of Nutrition and Dietetics    High Calorie, High Protein Nutrition Therapy  A high-calorie, high-protein diet has been recommended to you. Your registered dietitian nutritionist (RDN) may have recommended this diet because you are having difficulty eating enough calories throughout the day, you have lost weight, and/or you need to add protein to your diet.  Sometimes you may not feel like eating, even if you know the importance of good nutrition. The recommendations in this handout can help you with the following:  Regaining your strength and energy Keeping your body healthy Healing and recovering from surgery or illness and fighting infection  Schedule Your Meals and Snacks  Several small meals and snacks are often better tolerated and digested than large meals.  Strategies  Plan to eat 3 meals and 3 snacks daily. Experiment with timing meals to find out when you have a larger appetite. Appetite may be greatest in the morning after not eating all night so you may prefer to eat your larger meals and snacks in the morning and at lunch. Breakfast-type foods are often better tolerated so eat foods such as eggs, pancakes, waffles and cereal for any meal or snack. Carry snacks with you so you are prepared to eat every 2 to 3 hours. Determine what works best for you if your body's cues for feeling hungry or full are not working. Eat a small meal or snack even if you don't feel hungry. Set a timer to remind you when it is time to eat. Take a walk before you eat (with health care provider's approval). Light or moderate physical activity can help you maintain muscle and increase your appetite.  Make Eating Enjoyable  Taking steps to make the experience enjoyable may help to increase your interest  in eating and improve your appetite.  Strategies:  Eat with others whenever  possible. Include your favorite foods to make meals more enjoyable. Try new foods. Save your beverage for the end of the meal so that you have more room for food before you get full.  Add Calories to Your Meals and Snacks  Try adding calorie-dense foods so that each bite provides more nutrition.  Strategies  Drink milk, chocolate milk, soy milk, or smoothies instead of low-calorie beverages such as diet drinks or water. Cook with milk or soy milk instead of water when making dishes such as hot cereal, cocoa, or pudding. Add jelly, jam, honey, butter or margarine to bread and crackers. Add jam or fruit to ice cream and as a topping over cake. Mix dried fruit, nuts, granola, honey, or dry cereal with yogurt or hot cereals. Enjoy snacks such as milkshakes, smoothies, pudding, ice cream, or custard. Blend a fruit smoothie of a banana, frozen berries, milk or soy milk, and 1 tablespoon nonfat powdered milk or protein powder.  Add Protein to Your Meals and Snacks  Choose at least one protein food at each meal and snack to increase your daily intake.  Strategies  Add  cup nonfat dry milk powder or protein powder to make a high-protein milk to drink or to use in recipes that call for milk. Vanilla or peppermint extract or unsweetened cocoa powder could help to boost the flavor. Add hard-cooked eggs, leftover meat, grated cheese, canned beans or tofu to noodles, rice, salads, sandwiches, soups, casseroles, pasta, tuna and other mixed dishes. Add powdered milk or protein powder to hot cereals, meatloaf, casseroles, scrambled eggs, sauces, cream soups, and shakes. Add beans and lentils to salads, soups, casseroles, and vegetable dishes. Eat cottage cheese or yogurt, especially Greek yogurt, with fruit as a snack or dessert. Eat peanut or other nut butters on crackers, bread, toast, waffles, apples, bananas or celery sticks. Add it to milkshakes, smoothies, or desserts. Consider a ready-made  protein shake.   Add Fats to Your Meals and Snacks  Try adding fats to your meals and snacks. Fat provides more calories in fewer bites than carbohydrate or protein and adds flavors to your foods.  Strategies  Snack on nuts and seeds or add them to foods like salads, pasta, cereals, yogurt, and ice cream.  Saut or stir-fry vegetables, meats, chicken, fish or tofu in olive or canola oil.  Add olive oil, other vegetable oils, butter or margarine to soups, vegetables, potatoes, cooked cereal, rice, pasta, bread, crackers, pancakes, or waffles. Snack on olives or add to pasta, pizza, or salad. Add avocado or guacamole to your salads, sandwiches, and other entrees. Include fatty fish such as salmon in your weekly meal plan.  Tips For Adding Protein Nutrition Therapy    Tips Add extra egg to one or more meals  Increase the portion of milk to drink and change to skim milk if able  Include [place] yogurt or cottage cheese for snack or part of a meal  Increase portion size of protein entre and decrease portion of starch/bread  Mix protein powder, nut butter, almond/nut milk, non-fat dry milk, or Greek yogurt to shakes and smoothies  Use these ingredients also in baked goods or other recipes Use double the amount of sandwich filling  Add protein foods to all snacks including cheese, nut butters, milk and yogurt  Food Tips for Including Protein  Beans Cook and use dried peas, beans, and tofu in soups or add  to casseroles, pastas, and grain dishes that also contain cheese or meat  Mash with cheese and milk  Use tofu to make smoothies  Commercial Protein Supplements Use nutritional supplements or protein powder sold at pharmacies and grocery stores  Use protein powder in milk drinks and desserts, such as pudding  Mix with ice cream, milk, and fruit or other flavorings for a high-protein milkshake  Cottage Cheese or [employer] Mix with or use to stuff fruits and vegetables  Add to  casseroles, spaghetti, noodles, or egg dishes such as omelets, scrambled eggs, and souffls  Use gelatin, pudding-type desserts, cheesecake, and pancake or waffle batter  Use to stuff crepes, pasta shells, or manicotti  Puree and use as a substitute for sour cream  Eggs, Egg whites, and Egg Yolks Add chopped, hard-cooked eggs to salads and dressings, vegetables, casseroles, and creamed meats  Beat eggs into mashed potatoes, vegetable purees, and sauces  Add extra egg whites to quiches, scrambled eggs, custards, puddings, pancake batter, or [place] toast wash/batter  Make a rich custard with egg yolks, double strength milk, and sugar  Add extra hard-cooked yolks to deviled egg filling and sandwich spreads  Hard or Semi-Soft Cheese (Cheddar, Barnabas Lister, [place]) Melt on sandwiches, bread, muffins, tortillas, hamburgers, hot dogs, other meats or fish, vegetables, eggs, or desserts such as stewed figs or pies  Grate and add to soups, sauces, casseroles, vegetable dishes, potatoes, rice noodles, or meatloaf  Serve as a snack with crackers or bagels  Ice cream, Yogurt, and Frozen Yogurt Add to milk drinks such as milkshakes  Add to cereals, fruits, gelatin desserts, and pies  Blend or whip with soft or cooked fruits  Sandwich ice cream or frozen yogurt between enriched cake slices, cookies, or graham crackers  Use seasoned yogurt as a dip for fruits, vegetables, or chips  Use yogurt in place of sour cream in casseroles  Meat and Fish Add chopped, cooked meat or fish to vegetables, salads, casseroles, soups, sauces, and biscuit dough  Use in omelets, souffls, quiches, and sandwich fillings  Add chicken and [place] to stuffing  Wrap in pie crust or biscuit dough as turnovers  Add to stuffed baked potatoes  Add pureed meat to soups  Milk Use in beverages and in cooking  Use in preparing foods, such as hot cereal, soups, cocoa, or pudding  Add cream sauces to vegetable and other dishes  Use evaporated milk,  evaporated skim milk, or sweetened condensed milk instead of milk or water in recipes.  Nonfat Dry Milk Add 1/3 cup of nonfat dry milk powdered milk to each cup of regular milk for "double strength" milk  Add to yogurt and milk drinks, such as pasteurized eggnog and milkshakes  Add to scrambled eggs and mashed potatoes  Use in casseroles, meatloaf, hot cereal, breads, muffins, sauces, cream soups, puddings and custards, and other milk-based desserts  Nuts, Seeds, and Wheat Germ Add to casseroles, breads, muffins, pancakes, cookies, and waffles  Sprinkle on fruit, cereal, ice cream, yogurt, vegetables, salads, and toast as a crunchy topping  Use in place of breadcrumbs  Blend with parsley or spinach, herbs, and cream for a noodle, pasta, or vegetable sauce.  Roll banana in chopped nuts  Peanut Butter Spread on sandwiches, toast, muffins, crackers, waffles, pancakes, and fruit slices  Use as a dip for raw vegetables, such as carrots, cauliflower, and celery  Blend with milk drinks, smoothies, and other beverages  Swirl through soft ice cream or yogurt  Spread  on a banana then roll in crushed, dry cereal or chopped nuts   Small Meal and Snack Ideas  These snacks and meals are recommended when you have to eat but aren't necessarily hungry.  They are good choices because they are high in protein and high in calories.   2 graham crackers, 2 tablespoons peanut or other nut butter, 1 cup milk  cup [place] yogurt,  cup fruit,  cup granola 2 deviled egg halves, 5 whole wheat crackers 1 cup cream of tomato soup,  grilled cheese sandwich 1 toasted waffle topped with: 2 tablespoons peanut or nut butter, 1 tablespoon jam Trail mix made with:  cup nuts,  cup dried fruit,  cup cold cereal, any variety  cup oatmeal or cream of wheat cereal, 1 tablespoon peanut or nut butter,  cup diced fruit  High-Calorie, High-Protein Sample 1-Day Menu   Breakfast 1 egg, scrambled 1 ounce cheddar cheese 1  English muffin, whole wheat 1 tablespoon margarine 1 tablespoon jam  cup orange juice, fortified with calcium and vitamin D  Morning Snack 1 tablespoon peanut butter 1 banana 1 cup 1% milk  Lunch Tuna salad sandwich made with: 2 slices bread, whole wheat 3 ounces tuna mixed with: 1 tablespoon mayonnaise  cup pudding  Afternoon Snack  cup hummus  cup carrots 1 pita  Evening Meal Enchilada casserole made with: 2 corn tortillas 3 ounces ground beef, cooked  cup black beans, cooked  cup corn, cooked 1 ounce grated cheddar cheese  cup enchilada sauce  avocado, sliced, topping for enchilada 1 tablespoon sour cream, topping for enchilada Salad:  cup lettuce, shredded  cup tomatoes, chopped, for salad 1 tablespoon olive oil and vinegar dressing, for salad  Evening Snack  cup Greek yogurt  cup blueberries  cup granola  Copyright 2020  [employer]. All rights reserved  [facility], [place]  OPEN ABDOMINAL SURGERY: POST OP INSTRUCTIONS  Always review your discharge instruction sheet given to you by the facility where your surgery was performed.  A prescription for pain medication may be given to you upon discharge.  Take your pain medication as prescribed.  If narcotic pain medicine is not needed, then you may take acetaminophen (Tylenol) or ibuprofen (Advil) as needed. Take your usually prescribed medications unless otherwise directed. If you need a refill on your pain medication, please contact your pharmacy. They will contact our office to request authorization.  Prescriptions will not be filled after 5 pm or on weekends. You should follow a light diet the first few days after arrival home, such as soup and crackers, unless your doctor has advised otherwise. A high-fiber, low fat diet can be resumed as tolerated.  Be sure to include plenty of fluids daily.  Most patients will experience some swelling and bruising in the area of  the incision. Ice packs will help. Swelling and bruising can take several days to resolve. It is common to experience some constipation if taking pain medication after surgery.  Increasing fluid intake and taking a stool softener will usually help or prevent this problem from occurring.  A mild laxative (Milk of Magnesia or Miralax) should be taken according to package directions if there are no bowel movements after 48 hours.  You may have steri-strips (small skin tapes) in place directly over the incision.  These strips should be left on the skin for 5-7 days.  Any sutures or staples will be removed at the office during your follow-up visit. You may find  that a light gauze bandage over your incision may keep your staples from being rubbed or pulled. You may shower and replace the bandage daily. ACTIVITIES:  You may resume regular (light) daily activities beginning the next day - such as daily self-care, walking, climbing stairs - gradually increasing activities as tolerated.  You may have sexual intercourse when it is comfortable.  Refrain from any heavy lifting or straining until approved by your doctor.  You may drive when you no longer are taking prescription pain medication, you can comfortably wear a seatbelt, and you can safely maneuver your car and apply brakes. You should see your doctor in the office for a follow-up appointment approximately 2-3 weeks after your surgery.  Make sure that you call for this appointment within a day or two after you arrive home to insure a convenient appointment time.  WHEN TO CALL YOUR DOCTOR: Fever greater than 101.0 Inability to urinate Persistent nausea and/or vomiting Extreme swelling or bruising Continued bleeding from incision Increased pain, redness, or drainage from the incision Difficulty swallowing or breathing Muscle cramping or spasms Numbness or tingling in hands or around lips  IF YOU HAVE DISABILITY OR FAMILY LEAVE FORMS, YOU MUST BRING THEM  TO THE OFFICE FOR PROCESSING.  PLEASE DO NOT GIVE THEM TO YOUR DOCTOR.  The clinic staff is available to answer your questions during regular business hours.  Please don't hesitate to call and ask to speak to one of the nurses if you have concerns.  [facility], [place] Office: [phone]  For further questions, please visit [email or web address]

## 2021-12-01 NOTE — Progress Notes (Signed)
Nutrition Follow-up  DOCUMENTATION CODES:   Obesity unspecified  INTERVENTION:  - will decrease Boost Breeze from TID to once/day, each supplement provides 250 kcal and 9 grams protein.   - continue Ensure Max once/day, each supplement provides 150 kcal and 30 grams protein.   - will order 1 packet Juven BID, each packet provides 95 calories, 2.5 grams of protein (collagen), and 9.8 grams of carbohydrate (3 grams sugar); also contains 7 grams of L-arginine and L-glutamine, 300 mg vitamin C, 15 mg vitamin E, 1.2 mcg vitamin B-12, 9.5 mg zinc, 200 mg calcium, and 1.5 g  Calcium Beta-hydroxy-Beta-methylbutyrate to support wound healing.  - continue TPN per Pharmacist.  - diet advancement as medically feasible.  - will place Low Fiber Nutrition Therapy handout in AVS in anticipation of this being the diet patient will discharge home on.  - will place High Calorie, High Protein Nutrition Therapy handout in AVS.    NUTRITION DIAGNOSIS:   Increased nutrient needs related to acute illness, post-op healing, wound healing as evidenced by estimated needs. -ongoing  GOAL:   Patient will meet greater than or equal to 90% of their needs -met with PO intakes and TPN  MONITOR:   PO intake, Supplement acceptance, Diet advancement, Labs, Weight trends, Other (Comment) (TPN regimen)   ASSESSMENT:   65 year old male with medical history of type 2 DM, HTN, CKD, osteoarthritis, and sleep apnea. He was admitted d/t severe sepsis d/t persistent intra-abdominal abscess. CT abdomen/pelvis showed multiple loculated fluid collection in the lower abdomen with a large cavity in the pelvis measuring 11.5 x 6 x 11.7 cm. He was seen by IR and drain was placed. Hospital course was also complicated by AKI, hypoxia and afib with RVR. Repeat CT on 6/9 showed resolution of abscess, drain was removed by IR on 6/13. Another CT abdomen was done on 6/14 and showed recurrent pelvic abscess. IR placed LLQ and RLQ drains on  6/15. On 6/18 patient had Candida tropicalis growing in abscess drainage. ID consulted patient started on micafungin. Repeat CT abdomen 6/19 with multiple fluid collections. On 6/20 patient underwent ex lap and drainage of pelvic abscess due to complicated diverticulitis not responding to medical therapy and drains.  Patient was transferred from 4W to the ICU on 6/24 due to changes in neuro status and respiratory status. Patient was intubated on 6/24 and extubated on 6/25.  Updated skin status documentation in RD note today d/t additions from 6/24.   Patient laying in bed with his daughter at bedside. Diet was advanced to CLD on 6/26 AM and to FLD yesterday AM. Flow sheet documentation indicates that he consumed 0% of breakfast, 25% of lunch, and 25% of dinner yesterday.   Today he consumed ice cream and ginger ale; did not like other items on breakfast tray. He is not having abdominal discomfort or nausea today. He has tried [employer] but has found them to be too sweet; discussed options to aid with this. Also discussed daughter bringing in clear and/or full liquid items that patient may prefer and that seasonings could be added to at home to make them more palatable. Suggested milkshakes from [facility] (with no candy or fruit pieces) if patient is interested d/t protein and calorie content.   Patient is receiving custom TPN at goal rate of 95 ml/hr which is providing 2366 kcal and 130 grams protein.   Patient's daughter asked about diet-related information for when patient is able to d/c. Will place handout in AVS and  shared that RD will make a point to stop back if patient to d/c home on a diet other than Soft/Low fiber.   He was last weighed on 6/24 and weight on that date was -10 lb compared to weight on 6/20. Re-weigh patient today when he gets up to walk; patient reports walking several times/day.    Labs reviewed; CBGs: 121 and 120 mg/dl, BUN: 25 mg/dl, Ca: 8.7 mg/dl.  Medications  reviewed; 100 mg colace BID, sliding scale novolog, 7.5 mg remeron/day, 17 g miralax/day.    Diet Order:   Diet Order             Diet full liquid Room service appropriate? Yes; Fluid consistency: Thin  Diet effective now                   EDUCATION NEEDS:   Education needs have been addressed  Skin:  Skin Assessment: Skin Integrity Issues: Skin Integrity Issues:: Stage III, Incisions Stage III: bilateral ITs and R buttocks (newly documented 6/24) Incisions: abdomen (6/20) Other: MASD to bilateral buttocks  Last BM:  type 5 one week ago  Height:   Ht Readings from Last 1 Encounters:  11/27/21 5' 6.93" (1.7 m)    Weight:   Wt Readings from Last 1 Encounters:  11/27/21 117.1 kg     BMI:  Body mass index is 40.52 kg/m.  Estimated Nutritional Needs:  Kcal:  2300-2600 kcal Protein:  115-130 grams Fluid:  >/= 2.4 L/day     Jarome Matin, MS, RD, LDN, CNSC Registered Dietitian II Inpatient Clinical Nutrition RD pager # and on-call/weekend pager # available in [facility]

## 2021-12-01 NOTE — Progress Notes (Signed)
PHARMACY - TOTAL PARENTERAL NUTRITION CONSULT NOTE   Indication: Prolonged ileus  Patient Measurements: Height: 5' 6.93" (170 cm) Weight: 117.1 kg (258 lb 2.5 oz) IBW/kg (Calculated) : 65.94 TPN AdjBW (KG): 79.8 Body mass index is 40.52 kg/m. Usual Weight:   Assessment:  66 yo male admitted for sepsis d/t multiple diverticular IAAs. Failed conservative measures and underwent ex lap 6/20. Unable to perform colectomy or diversion d/t extensively frozen bowel. LLQ drain placed by IR on 6/24. Pharmacy consulted to dose TPN with prolonged ileus.  Glucose / Insulin: Hx DM on PTA metformin, SGLT2, GLP1 - CBGs goal 119 - 145 - 10 units SSI in 24 hrs, on mSSI q4h Electrolytes: K (4.9) on upper end of normal, CorrCa (10.5) slightly elevated. Other lytes WNL Renal: SCr improving. BUN remains elevated but trending down Hepatic: albumin low; LFTs, bili, TG all WNL  - new cirrhosis Dx on imaging this admission Intake / Output; MIVF:  - drain placed by IR 6/24, total output: 77 mL (decreasing) - 24 hr UOP: 2350 mL - PO intake: 240 mL GI Surgeries / Procedures:  - 6/20 Pt post-op exploratory laparotomy and drainage of pelvic abscess. -6/24: CT guided drain placement for LLQ abscess by IR  Central access: 11/08/2021 TPN start date: 11/24/2021  Nutritional Goals: Goal TPN rate is 95 mL/hr (provides 130 g protein and 2366 kcals per day)  RD Assessment: Estimated Needs Total Energy Estimated Needs: 2300-2600 kcal Total Protein Estimated Needs: 115-130 grams Total Fluid Estimated Needs: >/= 2.4 L/day  Current Nutrition:  FLD, TPN at goal rate -Boost Breeze TID (last charted 6/25)  Advancing diet. To CLD on 6/26 >> FLD on 6/27. Minimal PO intake  Plan:  Now: Change mSSI from q4h to q6h since BG well controlled  At 1800: Continue TPN at goal rate of 95 mL/hr Discussed with CCS. Continue at goal rate for now, adding mirtazapine Electrolytes in TPN: Remove Ca, Reduce K Na 100 mEq/L K 20  mEq/L Ca 0 mEq/L  Mg 5 mEq/L Phos 19 mmol/L Cl:Ac ratio 1:1 Add standard MVI and trace elements to TPN Continue mSSI q6h Monitor TPN labs on Mon/Thurs Monitor for ability to tolerate advancing diet and wean off of [place], PharmD, BCPS Clinical Pharmacist 12/01/2021 7:34 AM

## 2021-12-01 NOTE — Consult Note (Addendum)
[facility] Nurse Consult Note: Reason for Consult: Vac dressing changed. Pt was medicated for pain prior to the procedure and tolerated with minimal amt discomfort.  Wound type: Abd with full thickness post-op wound; slowly decreasing in size, 20X4X2cm, beefy red with blue sutures visible in the center, scant amt pink drainage.  Periwound: intact  Dressing procedure/placement/frequency: Applied one piece black foam to 165m cont suction.  Barrier ring to lower edges to attempt to maintain a seal. WOC team will plan for dressing change on Fri. DJulien GirtMSN, RN, [place]

## 2021-12-01 NOTE — Progress Notes (Signed)
PROGRESS NOTE    Robert Larson  WUG:[id number] DOB: 1955-08-07 DOA: 11/07/2021 PCP: Jeanie Sewer, NP   Brief Narrative:  66 y/o male admitted on 6/4 in the setting of sepsis from multiple intra-abdominal abscesses to the hospitalists service.  He has been treated with antibiotics, intra-abdominal fluid drainage via percutaneous catheters, and supportive care.  In the setting of septic shock his course has been complicated by AKI, hypoxemia and atrial fibrillation with RVR.  He has had multiple drains placed, one from 6/18 grew candida tropicalis so ID was consulted.  On 6/20 he underwent an ex-lap and drainage of pelvic abscesses likely due to complicated diverticulitis by Dr. Donne Hazel.  Per surgery notes he was originally planned for at least a partial colectomy but noted intra-operatively to have a large portion of his abdomen frozen so bowel resection was not possible.  A 19 French drain was placed, IR drains removed.  Urology also participated in the procedure and performed bilateral retrograde pyelogram and bilateral open-ended ureteral catheter placement.   PCCM was consulted on 6/24 around 2 AM due to a change in mental status, worsening tachycardia and tachypnea.  Transferred to ICU, intubated.  Significant Hospital Events: Including procedures, antibiotic start and stop dates in addition to other pertinent events   6/4 admission, started zosyn 6/5 moved to ICU septic shock, IR drain placed in abdomen, grew e-coli and mixed anaerobic fluid 6/13 drain removed by IR  6/14 recurrent pelvic abscess on CT abdomen 6/15 2 new drains placed by IR in lower abdomen bilaterally, grew candida tropicalis 6/17 ID consult: micafungin 6/20 ex-lap, bilateral open ended ureteral catheter placement, drain placed in pelvic fluid collection 6/24 pccm consulted for confusion, fever tachycardia, remains on zosyn, intubated for airway protection, increased work of breathing with confusion.  Respiratory  alkalosis on ABG, lactic acid elevated at 3.7. daughter confirms full code status by phone. 6/25 extubated. A line removed. 6/26 vanc stopped 6/27, transferred under TRH. Assessment & Plan:   Principal Problem:   Intra-abdominal abscesses s/p perc drainage 11/08/2021 Active Problems:   Alteration in electrolyte and fluid balance   Essential hypertension   Type 2 diabetes mellitus with morbid obesity ([place])   Diverticulosis   Acute renal failure superimposed on stage 3a chronic kidney disease (HCC)   OSA (obstructive sleep apnea)   Osteoarthritis   Adrenal nodule (HCC)   History of adenomatous polyp of colon   Spinal stenosis of lumbar region   Renal cyst   Cholelithiasis   Tobacco abuse   Pressure injury of skin   GERD (gastroesophageal reflux disease)  Sepsis (abd) Multiple intra-abdominal abscesses: Initially managed with IR drain placement 6/5 and 6/15 with cultures growing E. coli, anaerobes, and Candida tropicalis.  Now status post exploratory laparotomy and drainage of pelvic abscess with general surgery 11/23/2021 (no new cultures) and found to have a good portion of his abdomen frozen at that time so unable to safely undergo resection of the sigmoid colon or diversion.  Repeat CT scan 6/24 given clinical instability showed a interval development of a large loculated fluid collection (5.3 x 9.9 x 21 cm) status post IR drain placement.  Gram stain with GNRs and cultures growing pansensitive E. coli.  Patient on Merrem and micafungin, managed by ID.  Vancomycin stopped on 11/29/2021.  Patient states that his pain is almost gone.  He is tolerating full liquid diet and surgery is continuing that.  Drains are managed by IR. He is improving.  TPN continues as well.  Acute respiratory failure with hypoxemia w/ concern for aspiration: Needed intubation on 11/27/2021 and successfully extubated 6/25.  Currently no complaints and saturating over 90% on room air.  Continue incentive  spirometry.  Acute kidney injury: Patient does not appear to have CKD per my chart review but it is documented at several notes that he had CKD.  CKD ruled out.  He had AKI which is resolved.  Essential hypertension: PTA he was on telmisartan and hydrochlorothiazide which are on hold due to AKI.  Started on amlodipine 5 mg yesterday, blood pressure is elevated, will increase to 10 mg.  Continue Toprol-XL.  Type 2 diabetes mellitus: At home takes Trulicity and metformin which are on hold.  Blood sugar controlled.  Continue SSI.  GERD: Continue PPI.  DVT prophylaxis: heparin injection 5,000 Units Start: 11/27/21 0600 Place TED hose Start: 11/21/21 0950 SCDs Start: 11/07/21 1811   Code Status: Full Code  Family Communication: Daughter present at bedside.  Plan of care discussed with patient in length and he/she verbalized understanding and agreed with it.  Status is: Inpatient Remains inpatient appropriate because: Advancing diet, will be discharged when cleared by general surgery.   Estimated body mass index is 40.52 kg/m as calculated from the following:   Height as of this encounter: 5' 6.93" (1.7 m).   Weight as of this encounter: 117.1 kg.  Pressure Injury 11/27/21 Ischial tuberosity Left Stage 3 -  Full thickness tissue loss. Subcutaneous fat may be visible but bone, tendon or muscle are NOT exposed. S3 2x3 (Active)  11/27/21 0800  Location: Ischial tuberosity  Location Orientation: Left  Staging: Stage 3 -  Full thickness tissue loss. Subcutaneous fat may be visible but bone, tendon or muscle are NOT exposed.  Wound Description (Comments): S3 2x3  Present on Admission: Yes  Dressing Type Foam - Lift dressing to assess site every shift 11/29/21 2002     Pressure Injury 11/27/21 Ischial tuberosity Right Stage 3 -  Full thickness tissue loss. Subcutaneous fat may be visible but bone, tendon or muscle are NOT exposed. S3 1x1 (Active)  11/27/21 0800  Location: Ischial tuberosity   Location Orientation: Right  Staging: Stage 3 -  Full thickness tissue loss. Subcutaneous fat may be visible but bone, tendon or muscle are NOT exposed.  Wound Description (Comments): S3 1x1  Present on Admission: Yes  Dressing Type Foam - Lift dressing to assess site every shift 11/29/21 2002     Pressure Injury 11/27/21 Buttocks Right Stage 3 -  Full thickness tissue loss. Subcutaneous fat may be visible but bone, tendon or muscle are NOT exposed. 1x1 (Active)  11/27/21 0800  Location: Buttocks  Location Orientation: Right  Staging: Stage 3 -  Full thickness tissue loss. Subcutaneous fat may be visible but bone, tendon or muscle are NOT exposed.  Wound Description (Comments): 1x1  Present on Admission: Yes  Dressing Type Foam - Lift dressing to assess site every shift 11/29/21 2002     Pressure Injury 11/27/21 Sacrum Right;Left Deep Tissue Pressure Injury - Purple or maroon localized area of discolored intact skin or blood-filled blister due to damage of underlying soft tissue from pressure and/or shear. L 9x6   R 11x7 (Active)  11/27/21 0800  Location: Sacrum  Location Orientation: Right;Left  Staging: Deep Tissue Pressure Injury - Purple or maroon localized area of discolored intact skin or blood-filled blister due to damage of underlying soft tissue from pressure and/or shear.  Wound Description (Comments): L 9x6   R  11x7  Present on Admission: Yes  Dressing Type Foam - Lift dressing to assess site every shift 11/29/21 0740   Nutritional Assessment: Body mass index is 40.52 kg/m.Marland Kitchen Seen by dietician.  I agree with the assessment and plan as outlined below: Nutrition Status: Nutrition Problem: Increased nutrient needs Etiology: acute illness, post-op healing, wound healing Signs/Symptoms: estimated needs Interventions: Boost Breeze, Premier Protein, Juven, Education, TPN  . Skin Assessment: I have examined the patient's skin and I agree with the wound assessment as performed  by the wound care RN as outlined below: Pressure Injury 11/27/21 Ischial tuberosity Left Stage 3 -  Full thickness tissue loss. Subcutaneous fat may be visible but bone, tendon or muscle are NOT exposed. S3 2x3 (Active)  11/27/21 0800  Location: Ischial tuberosity  Location Orientation: Left  Staging: Stage 3 -  Full thickness tissue loss. Subcutaneous fat may be visible but bone, tendon or muscle are NOT exposed.  Wound Description (Comments): S3 2x3  Present on Admission: Yes  Dressing Type Foam - Lift dressing to assess site every shift 11/29/21 2002     Pressure Injury 11/27/21 Ischial tuberosity Right Stage 3 -  Full thickness tissue loss. Subcutaneous fat may be visible but bone, tendon or muscle are NOT exposed. S3 1x1 (Active)  11/27/21 0800  Location: Ischial tuberosity  Location Orientation: Right  Staging: Stage 3 -  Full thickness tissue loss. Subcutaneous fat may be visible but bone, tendon or muscle are NOT exposed.  Wound Description (Comments): S3 1x1  Present on Admission: Yes  Dressing Type Foam - Lift dressing to assess site every shift 11/29/21 2002     Pressure Injury 11/27/21 Buttocks Right Stage 3 -  Full thickness tissue loss. Subcutaneous fat may be visible but bone, tendon or muscle are NOT exposed. 1x1 (Active)  11/27/21 0800  Location: Buttocks  Location Orientation: Right  Staging: Stage 3 -  Full thickness tissue loss. Subcutaneous fat may be visible but bone, tendon or muscle are NOT exposed.  Wound Description (Comments): 1x1  Present on Admission: Yes  Dressing Type Foam - Lift dressing to assess site every shift 11/29/21 2002     Pressure Injury 11/27/21 Sacrum Right;Left Deep Tissue Pressure Injury - Purple or maroon localized area of discolored intact skin or blood-filled blister due to damage of underlying soft tissue from pressure and/or shear. L 9x6   R 11x7 (Active)  11/27/21 0800  Location: Sacrum  Location Orientation: Right;Left  Staging:  Deep Tissue Pressure Injury - Purple or maroon localized area of discolored intact skin or blood-filled blister due to damage of underlying soft tissue from pressure and/or shear.  Wound Description (Comments): L 9x6   R 11x7  Present on Admission: Yes  Dressing Type Foam - Lift dressing to assess site every shift 11/29/21 0740    Consultants:  General surgery IR ID PCCM-signed off Procedures:  As above  Antimicrobials:  Anti-infectives (From admission, onward)    Start     Dose/Rate Route Frequency Ordered Stop   11/30/21 2200  piperacillin-tazobactam (ZOSYN) IVPB 3.375 g        3.375 g 12.5 mL/hr over 240 Minutes Intravenous Every 8 hours 11/30/21 1435     11/29/21 0600  vancomycin (VANCOREADY) IVPB 1250 mg/250 mL  Status:  Discontinued        1,250 mg 166.7 mL/hr over 90 Minutes Intravenous Every 24 hours 11/28/21 0759 11/29/21 0727   11/28/21 0500  vancomycin (VANCOCIN) IVPB 1000 mg/200 mL premix  Status:  Discontinued        1,000 mg 200 mL/hr over 60 Minutes Intravenous Every 24 hours 11/27/21 0414 11/28/21 0759   11/27/21 0600  meropenem (MERREM) 1 g in sodium chloride 0.9 % 100 mL IVPB  Status:  Discontinued        1 g 200 mL/hr over 30 Minutes Intravenous Every 8 hours 11/27/21 0346 11/30/21 1435   11/27/21 0430  vancomycin (VANCOREADY) IVPB 2000 mg/400 mL        2,000 mg 200 mL/hr over 120 Minutes Intravenous  Once 11/27/21 0346 11/27/21 0655   11/24/21 1400  piperacillin-tazobactam (ZOSYN) IVPB 3.375 g  Status:  Discontinued        3.375 g 12.5 mL/hr over 240 Minutes Intravenous Every 8 hours 11/24/21 1018 11/27/21 0323   11/24/21 1200  Ampicillin-Sulbactam (UNASYN) 3 g in sodium chloride 0.9 % 100 mL IVPB  Status:  Discontinued        3 g 200 mL/hr over 30 Minutes Intravenous Every 6 hours 11/24/21 0900 11/24/21 1018   11/23/21 0830  piperacillin-tazobactam (ZOSYN) IVPB 3.375 g  Status:  Discontinued        3.375 g 12.5 mL/hr over 240 Minutes Intravenous Every 8  hours 11/23/21 0736 11/24/21 0858   11/22/21 1200  Ampicillin-Sulbactam (UNASYN) 3 g in sodium chloride 0.9 % 100 mL IVPB  Status:  Discontinued        3 g 200 mL/hr over 30 Minutes Intravenous Every 6 hours 11/22/21 0923 11/23/21 0736   11/20/21 1645  micafungin (MYCAMINE) 100 mg in sodium chloride 0.9 % 100 mL IVPB        100 mg 105 mL/hr over 1 Hours Intravenous Every 24 hours 11/20/21 1554     11/08/21 0730  vancomycin (VANCOREADY) IVPB 1750 mg/350 mL        1,750 mg 175 mL/hr over 120 Minutes Intravenous  Once 11/08/21 0641 11/08/21 0908   11/08/21 0641  vancomycin variable dose per unstable renal function (pharmacist dosing)  Status:  Discontinued         Does not apply See admin instructions 11/08/21 0641 11/08/21 1138   11/07/21 2000  piperacillin-tazobactam (ZOSYN) IVPB 3.375 g  Status:  Discontinued        3.375 g 12.5 mL/hr over 240 Minutes Intravenous Every 8 hours 11/07/21 1921 11/22/21 0923   11/07/21 1430  piperacillin-tazobactam (ZOSYN) IVPB 3.375 g        3.375 g 100 mL/hr over 30 Minutes Intravenous  Once 11/07/21 1418 11/07/21 1535         Subjective:  Patient seen and examined.  Daughter at the bedside.  Patient states that his abdominal pain is almost gone.  No other complaint.  He is very much looking forward to going home in near future.  Objective: Vitals:   11/30/21 1415 11/30/21 2048 11/30/21 2224 12/01/21 0432  BP: (!) 145/110 (!) 162/93  (!) 169/90  Pulse: 71 65 65 64  Resp: 16 (!) 23 (!) 23 (!) 22  Temp: 97.7 F (36.5 C) 98.9 F (37.2 C)  98.2 F (36.8 C)  TempSrc: Oral Oral  Oral  SpO2: 99% 96% 97% 96%  Weight:      Height:        Intake/Output Summary (Last 24 hours) at 12/01/2021 1243 Last data filed at 12/01/2021 0742 Gross per 24 hour  Intake 1683.45 ml  Output 3027 ml  Net -1343.55 ml    Filed Weights   11/21/21 0000 11/23/21 1041 11/27/21 0000  Weight: 121.5 kg 121.5 kg 117.1 kg    Examination:  General exam: Appears calm and  comfortable, obese Respiratory system: Clear to auscultation. Respiratory effort normal. Cardiovascular system: S1 & S2 heard, RRR. No JVD, murmurs, rubs, gallops or clicks. No pedal edema. Gastrointestinal system: Abdomen is nondistended, soft and tender at the drain sites. No organomegaly or masses felt. Normal bowel sounds heard. Central nervous system: Alert and oriented. No focal neurological deficits. Extremities: Symmetric 5 x 5 power. Skin: No rashes, lesions or ulcers.  Psychiatry: Judgement and insight appear normal. Mood & affect appropriate.   Data Reviewed: I have personally reviewed following labs and imaging studies  CBC: Recent Labs  Lab 11/27/21 0209 11/28/21 0500 11/29/21 1503 11/30/21 0342 12/01/21 1130  WBC 15.7* 13.1* 8.7 9.5 8.1  NEUTROABS  --   --  6.3  --   --   HGB 9.1* 7.0* 7.9* 8.3* 7.0*  HCT 29.0* 22.3* 25.2* 26.3* 22.5*  MCV 101.0* 101.8* 100.8* 100.4* 102.7*  PLT 167 154 146* 179 888    Basic Metabolic Panel: Recent Labs  Lab 11/25/21 0455 11/26/21 0357 11/27/21 0209 11/27/21 1030 11/28/21 0500 11/29/21 0535 11/30/21 0342 12/01/21 0436  NA 136 133* 133* 130* 136 135 135 136  K 4.0 4.3 4.8 4.4 4.1 4.2 4.4 4.9  CL 105 103 106 103 106 104 107 107  CO2 24 21* 17* 19* '24 24 22 23  '$ GLUCOSE 120* 134* 172* 229* 144* 126* 128* 119*  BUN 24* 25* 32* 36* 38* 31* 26* 25*  CREATININE 1.54* 1.45* 1.81* 1.74* 1.39* 1.13 1.07 0.95  CALCIUM 8.3* 8.6* 8.4* 8.0* 8.3* 8.0* 8.4* 8.7*  MG 1.8 1.8 1.7  --  2.1 2.0  --   --   PHOS 2.6 2.3* 1.9*  --  2.7 2.4* 2.7 3.3    GFR: Estimated Creatinine Clearance: 96 mL/min (by C-G formula based on SCr of 0.95 mg/dL). Liver Function Tests: Recent Labs  Lab 11/27/21 0209 11/28/21 0500 11/29/21 0535 11/30/21 0342 12/01/21 0436  AST 38 26 34 35 29  ALT '16 14 18 19 18  '$ ALKPHOS 38 33* 33* 38 39  BILITOT 0.9 0.6 0.6 0.7 0.5  PROT 7.0 6.0* 6.4* 6.7 6.9  ALBUMIN 2.0* 1.6* 1.7* 1.8* 1.8*    No results for  input(s): "LIPASE", "AMYLASE" in the last 168 hours. No results for input(s): "AMMONIA" in the last 168 hours. Coagulation Profile: No results for input(s): "INR", "PROTIME" in the last 168 hours. Cardiac Enzymes: No results for input(s): "CKTOTAL", "CKMB", "CKMBINDEX", "TROPONINI" in the last 168 hours. BNP (last 3 results) No results for input(s): "PROBNP" in the last 8760 hours. HbA1C: No results for input(s): "HGBA1C" in the last 72 hours. CBG: Recent Labs  Lab 11/30/21 2025 11/30/21 2355 12/01/21 0429 12/01/21 0730 12/01/21 1211  GLUCAP 124* 127* 121* 120* 122*    Lipid Profile: Recent Labs    11/29/21 0535  TRIG 110    Thyroid Function Tests: No results for input(s): "TSH", "T4TOTAL", "FREET4", "T3FREE", "THYROIDAB" in the last 72 hours. Anemia Panel: No results for input(s): "VITAMINB12", "FOLATE", "FERRITIN", "TIBC", "IRON", "RETICCTPCT" in the last 72 hours. Sepsis Labs: Recent Labs  Lab 11/27/21 0209 11/27/21 0456  LATICACIDVEN 3.6* 3.0*     Recent Results (from the past 240 hour(s))  Culture, blood (Routine X 2) w Reflex to ID Panel     Status: None (Preliminary result)   Collection Time: 11/27/21  2:09 AM   Specimen: BLOOD  Result Value  Ref [place] Status   Specimen Description   Final    BLOOD LEFT ANTECUBITAL Performed at [facility] [address], [place], [place] [address]    Special Requests   Final    BOTTLES DRAWN AEROBIC AND ANAEROBIC Blood Culture adequate volume Performed at [facility] [address] [place], [place] [address]    Culture   Final    NO GROWTH 4 DAYS Performed at [facility], [place] [address] [place], [place] [address]    Report Status PENDING  Incomplete  Culture, blood (Routine X 2) w Reflex to ID Panel     Status: None (Preliminary result)   Collection Time: 11/27/21  2:11 AM   Specimen: BLOOD  Result Value Ref [place] Status   Specimen Description   Final    BLOOD  BLOOD LEFT HAND Performed at [facility] [address] [place], [place] [address]    Special Requests   Final    BOTTLES DRAWN AEROBIC AND ANAEROBIC Blood Culture adequate volume Performed at [facility] [address] [place], [place] [address]    Culture   Final    NO GROWTH 4 DAYS Performed at [facility], [place] [address], [place], [place] [address]    Report Status PENDING  Incomplete  Aerobic/Anaerobic Culture w Gram Stain (surgical/deep wound)     Status: None (Preliminary result)   Collection Time: 11/27/21  1:55 PM   Specimen: Abscess  Result Value Ref [place] Status   Specimen Description   Final    ABSCESS Performed at [facility] [address], [place], [place] [address]    Special Requests   Final    Normal Performed at [facility], [place] [address], [place], [place] [address]    Gram Stain   Final    ABUNDANT WBC PRESENT, PREDOMINANTLY PMN RARE GRAM NEGATIVE RODS    Culture   Final    FEW ESCHERICHIA COLI CULTURE REINCUBATED FOR BETTER GROWTH FEW BACTEROIDES THETAIOTAOMICRON BETA LACTAMASE POSITIVE Performed at [facility], [place] [address] [place],  [address]    Report Status PENDING  Incomplete   Organism ID, Bacteria ESCHERICHIA COLI  Final      Susceptibility   Escherichia coli - MIC*    AMPICILLIN 4 SENSITIVE Sensitive     CEFAZOLIN <=4 SENSITIVE Sensitive     CEFEPIME <=0.12 SENSITIVE Sensitive     CEFTAZIDIME <=1 SENSITIVE Sensitive     CEFTRIAXONE <=0.25 SENSITIVE Sensitive     CIPROFLOXACIN <=0.25 SENSITIVE Sensitive     GENTAMICIN <=1 SENSITIVE Sensitive     IMIPENEM <=0.25 SENSITIVE Sensitive     TRIMETH/SULFA <=20 SENSITIVE Sensitive     AMPICILLIN/SULBACTAM <=2 SENSITIVE Sensitive     PIP/TAZO <=4 SENSITIVE Sensitive     * FEW ESCHERICHIA COLI     Radiology Studies: No results found.  Scheduled Meds:  amLODipine  10 mg Oral Daily    Chlorhexidine Gluconate Cloth  6 each Topical Daily   docusate sodium  100 mg Oral BID   [START ON 12/02/2021] feeding supplement  1 Container Oral Q24H   heparin injection (subcutaneous)  5,000 Units Subcutaneous Q8H   insulin aspart  0-15 Units Subcutaneous Q6H   lip balm  1 application  Topical BID   mouth rinse  15 mL Mouth Rinse q12n4p   metoprolol succinate  25 mg Oral Daily   mirtazapine  7.5 mg Oral  QHS   nutrition supplement (JUVEN)  1 packet Oral BID BM   pantoprazole  40 mg Oral Daily   polyethylene glycol  17 g Oral Daily   Ensure Max Protein  11 oz Oral Daily   sodium chloride flush  10-40 mL Intracatheter Q12H   sodium chloride flush  5 mL Intracatheter Q8H   Continuous Infusions:  sodium chloride 250 mL (11/23/21 1629)   sodium chloride Stopped (11/09/21 1144)   sodium chloride 10 mL/hr at 11/29/21 1452   sodium chloride     micafungin (MYCAMINE) 100 mg in sodium chloride 0.9 % 100 mL IVPB 100 mg (11/30/21 1733)   ondansetron (ZOFRAN) IV     piperacillin-tazobactam (ZOSYN)  IV 3.375 g (12/01/21 0622)   TPN ADULT (ION) 95 mL/hr at 11/30/21 1755   TPN ADULT (ION)       LOS: 24 days   Darliss Cheney, MD [facility]  12/01/2021, 12:43 PM   *Please note that this is a verbal dictation therefore any spelling or grammatical errors are due to the "[employer]" system interpretation.  Please page via [place] and do not message via secure chat for urgent patient care matters. Secure chat can be used for non urgent patient care matters.  How to contact the [facility] Attending or Consulting provider [place] or covering provider during after hours [place], for this patient?  Check the care team in [facility] and look for a) attending/consulting TR[facility] provider listed and b) the [facility] team listed. Page or secure chat [phone]. Log into [email or web address] and use [place]'s universal password to access. If you do not have the password, please contact the hospital operator. Locate the [facility]  provider you are looking for under [facility] and page to a number that you can be directly reached. If you still have difficulty reaching the provider, please page the [facility] (Director on Call) for the Hospitalists listed on amion for assistance.

## 2021-12-01 NOTE — Progress Notes (Signed)
8 Days Post-Op  Subjective: CC: Stable to improved left sided abdominal pain. Only taking in a small amount of fld without n/v. Feels food is bland and he has no appetite. Doesn't worsen pain. No shakes yesterday. Passing flatus. Last bm 2 days ago. Up with therapies yesterday  Objective: Vital signs in last 24 hours: Temp:  [97.7 F (36.5 C)-98.9 F (37.2 C)] 98.2 F (36.8 C) (06/28 0432) Pulse Rate:  [64-71] 64 (06/28 0432) Resp:  [16-23] 22 (06/28 0432) BP: (145-169)/(90-110) 169/90 (06/28 0432) SpO2:  [96 %-99 %] 96 % (06/28 0432) Last BM Date : 11/29/21  Intake/Output from previous day: 06/27 0701 - 06/28 0700 In: 1683.5 [P.O.:240; I.V.:1152.8; IV Piggyback:280.6] Out: 2427 [Urine:2350; Drains:77] Intake/Output this shift: Total I/O In: -  Out: 600 [Urine:600]  PE: Gen:  Alert, NAD, pleasant Card:  Reg rate Pulm: Rate and effort normal, on o2 Abd: Soft, obese, some L sided ttp without rigidity or guarding. Otherwise NT. Left-sided drain with murky seropurulent appearing output (30/24 hours). Right sided surgical drain is SS (22cc/24 hours)  Midline wound with wound vac in place - cdi with small amount of bloody output in cannister - wocn note reviewed and discussed with their team - beefy red with no current concerns  Lab Results:  Recent Labs    11/29/21 1503 11/30/21 0342  WBC 8.7 9.5  HGB 7.9* 8.3*  HCT 25.2* 26.3*  PLT 146* 179   BMET Recent Labs    11/30/21 0342 12/01/21 0436  NA 135 136  K 4.4 4.9  CL 107 107  CO2 22 23  GLUCOSE 128* 119*  BUN 26* 25*  CREATININE 1.07 0.95  CALCIUM 8.4* 8.7*   PT/INR No results for input(s): "LABPROT", "INR" in the last 72 hours. CMP     Component Value Date/Time   NA 136 12/01/2021 0436   NA 133 (A) 06/24/2021 0000   K 4.9 12/01/2021 0436   CL 107 12/01/2021 0436   CO2 23 12/01/2021 0436   GLUCOSE 119 (H) 12/01/2021 0436   BUN 25 (H) 12/01/2021 0436   BUN 18 06/24/2021 0000   CREATININE 0.95  12/01/2021 0436   CALCIUM 8.7 (L) 12/01/2021 0436   PROT 6.9 12/01/2021 0436   ALBUMIN 1.8 (L) 12/01/2021 0436   AST 29 12/01/2021 0436   ALT 18 12/01/2021 0436   ALKPHOS 39 12/01/2021 0436   BILITOT 0.5 12/01/2021 0436   GFRNONAA >60 12/01/2021 0436   GFRAA >60 02/19/2018 1123   Lipase     Component Value Date/Time   LIPASE 40 11/07/2021 1029    Studies/Results: No results found.  Anti-infectives: Anti-infectives (From admission, onward)    Start     Dose/Rate Route Frequency Ordered Stop   11/30/21 2200  piperacillin-tazobactam (ZOSYN) IVPB 3.375 g        3.375 g 12.5 mL/hr over 240 Minutes Intravenous Every 8 hours 11/30/21 1435     11/29/21 0600  vancomycin (VANCOREADY) IVPB 1250 mg/250 mL  Status:  Discontinued        1,250 mg 166.7 mL/hr over 90 Minutes Intravenous Every 24 hours 11/28/21 0759 11/29/21 0727   11/28/21 0500  vancomycin (VANCOCIN) IVPB 1000 mg/200 mL premix  Status:  Discontinued        1,000 mg 200 mL/hr over 60 Minutes Intravenous Every 24 hours 11/27/21 0414 11/28/21 0759   11/27/21 0600  meropenem (MERREM) 1 g in sodium chloride 0.9 % 100 mL IVPB  Status:  Discontinued  1 g 200 mL/hr over 30 Minutes Intravenous Every 8 hours 11/27/21 0346 11/30/21 1435   11/27/21 0430  vancomycin (VANCOREADY) IVPB 2000 mg/400 mL        2,000 mg 200 mL/hr over 120 Minutes Intravenous  Once 11/27/21 0346 11/27/21 0655   11/24/21 1400  piperacillin-tazobactam (ZOSYN) IVPB 3.375 g  Status:  Discontinued        3.375 g 12.5 mL/hr over 240 Minutes Intravenous Every 8 hours 11/24/21 1018 11/27/21 0323   11/24/21 1200  Ampicillin-Sulbactam (UNASYN) 3 g in sodium chloride 0.9 % 100 mL IVPB  Status:  Discontinued        3 g 200 mL/hr over 30 Minutes Intravenous Every 6 hours 11/24/21 0900 11/24/21 1018   11/23/21 0830  piperacillin-tazobactam (ZOSYN) IVPB 3.375 g  Status:  Discontinued        3.375 g 12.5 mL/hr over 240 Minutes Intravenous Every 8 hours 11/23/21  0736 11/24/21 0858   11/22/21 1200  Ampicillin-Sulbactam (UNASYN) 3 g in sodium chloride 0.9 % 100 mL IVPB  Status:  Discontinued        3 g 200 mL/hr over 30 Minutes Intravenous Every 6 hours 11/22/21 0923 11/23/21 0736   11/20/21 1645  micafungin (MYCAMINE) 100 mg in sodium chloride 0.9 % 100 mL IVPB        100 mg 105 mL/hr over 1 Hours Intravenous Every 24 hours 11/20/21 1554     11/08/21 0730  vancomycin (VANCOREADY) IVPB 1750 mg/350 mL        1,750 mg 175 mL/hr over 120 Minutes Intravenous  Once 11/08/21 0641 11/08/21 0908   11/08/21 0641  vancomycin variable dose per unstable renal function (pharmacist dosing)  Status:  Discontinued         Does not apply See admin instructions 11/08/21 0641 11/08/21 1138   11/07/21 2000  piperacillin-tazobactam (ZOSYN) IVPB 3.375 g  Status:  Discontinued        3.375 g 12.5 mL/hr over 240 Minutes Intravenous Every 8 hours 11/07/21 1921 11/22/21 0923   11/07/21 1430  piperacillin-tazobactam (ZOSYN) IVPB 3.375 g        3.375 g 100 mL/hr over 30 Minutes Intravenous  Once 11/07/21 1418 11/07/21 1535        Assessment/Plan POD 8 s/p exploratory laparotomy and drainage of pelvic abscess by Dr. 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 The pelvic fluid collection that was noted on CT was able to be drained w/ 32F blake drain left behind in this area. IR drains were removed. There is question if his disease process was related to his small bowel rather than being diverticular in nature.  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 There does not appear to be a good surgical option at this point either.  - S/p IR drainage 6/24 of LLQ fluid collection. Monitor drain, currently murky/purulent - Cont TPN. May be able to wean tomorrow if doing  better with diet in am - Keep on FLD. Add shakes and megace.  - Cont JP drain - Cont abx. Prior IR cx w/ Candida tropicalis, E. Coli and Bacteroides. ID is following.  - VAC M/W/F. WOCN following - PT/OT for mobilization - Appreciate TRH and IR assistance with his care   FEN - FLD, shakes, megace, IVF per primary, TPN VTE -  SCDs, subq heparin. Hgb pending this am. May be able restart heparin gtt if hgb stable ID - Zosyn and micafungin    ABL anemia - stable/uptrending at 8.3 on yesterday labs. Pending this am, heparin held A. Fib with RVR - resolved, lopressor.  AKI - resolved Acute respiratory failure with hypoxia - improved. 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

## 2021-12-02 ENCOUNTER — Other Ambulatory Visit: Payer: Self-pay | Admitting: Cardiology

## 2021-12-02 DIAGNOSIS — I480 Paroxysmal atrial fibrillation: Secondary | ICD-10-CM

## 2021-12-02 DIAGNOSIS — K651 Peritoneal abscess: Secondary | ICD-10-CM | POA: Diagnosis not present

## 2021-12-02 DIAGNOSIS — R002 Palpitations: Secondary | ICD-10-CM

## 2021-12-02 LAB — COMPREHENSIVE METABOLIC PANEL
ALT: 17 U/L (ref 0–44)
AST: 31 U/L (ref 15–41)
Albumin: 1.8 g/dL — ABNORMAL LOW (ref 3.5–5.0)
Alkaline Phosphatase: 39 U/L (ref 38–126)
Anion gap: 6 (ref 5–15)
BUN: 23 mg/dL (ref 8–23)
CO2: 23 mmol/L (ref 22–32)
Calcium: 8.8 mg/dL — ABNORMAL LOW (ref 8.9–10.3)
Chloride: 107 mmol/L (ref 98–111)
Creatinine, Ser: 1.12 mg/dL (ref 0.61–1.24)
GFR, Estimated: >60 mL/min (ref >60)
Glucose, Bld: 122 mg/dL — ABNORMAL HIGH (ref 70–99)
Potassium: 4.8 mmol/L (ref 3.5–5.1)
Sodium: 136 mmol/L (ref 135–145)
Total Bilirubin: 0.6 mg/dL (ref 0.3–1.2)
Total Protein: 7.1 g/dL (ref 6.5–8.1)

## 2021-12-02 LAB — CBC
HCT: 29 % — ABNORMAL LOW (ref 39.0–52.0)
Hemoglobin: 9.1 g/dL — ABNORMAL LOW (ref 13.0–17.0)
MCH: 32 pg (ref 26.0–34.0)
MCHC: 31.4 g/dL (ref 30.0–36.0)
MCV: 102.1 fL — ABNORMAL HIGH (ref 80.0–100.0)
Platelets: 264 10*3/uL (ref 150–400)
RBC: 2.84 MIL/uL — ABNORMAL LOW (ref 4.22–5.81)
RDW: 16.2 % — ABNORMAL HIGH (ref 11.5–15.5)
WBC: 9.9 10*3/uL (ref 4.0–10.5)
nRBC: 0 % (ref 0.0–0.2)

## 2021-12-02 LAB — CULTURE, BLOOD (ROUTINE X 2)
Culture: NO GROWTH
Culture: NO GROWTH
Special Requests: ADEQUATE
Special Requests: ADEQUATE

## 2021-12-02 LAB — MAGNESIUM: Magnesium: 1.9 mg/dL (ref 1.7–2.4)

## 2021-12-02 LAB — GLUCOSE, CAPILLARY
Glucose-Capillary: 111 mg/dL — ABNORMAL HIGH (ref 70–99)
Glucose-Capillary: 123 mg/dL — ABNORMAL HIGH (ref 70–99)
Glucose-Capillary: 127 mg/dL — ABNORMAL HIGH (ref 70–99)
Glucose-Capillary: 131 mg/dL — ABNORMAL HIGH (ref 70–99)

## 2021-12-02 LAB — TRIGLYCERIDES: Triglycerides: 117 mg/dL (ref <150)

## 2021-12-02 LAB — PHOSPHORUS: Phosphorus: 3.6 mg/dL (ref 2.5–4.6)

## 2021-12-02 MED ORDER — TRAVASOL 10 % IV SOLN
INTRAVENOUS | Status: AC
  Filled 2021-12-02: qty 684

## 2021-12-02 NOTE — Progress Notes (Signed)
NUTRITION NOTE  Patient seen for full follow-up assessment yesterday. Consult received for 48 hour Calorie Count.   RD will follow-up 6/30 and 7/3 with results.      Jarome Matin, MS, RD, LDN, [place] Registered Dietitian II Inpatient Clinical Nutrition RD pager # and on-call/weekend pager # available in [facility]

## 2021-12-02 NOTE — Progress Notes (Signed)
cardica

## 2021-12-02 NOTE — Progress Notes (Signed)
Referring Physician(s): Jeanmarie Hubert  Supervising Physician: Ruthann Cancer  Patient Status:  [facility] - In-pt  Chief Complaint: Intra-abdominal abscess s/p LLQ drain placement by Dr Kathlene Cote 6/24   Subjective: Pt sitting up in chair in room, accompanied by daughter Marissa at time of exam. Patient is cheerful, saying that they may be discharged this weekend. Pt states they have mild abdominal discomfort but that their drain is not bothering them.    Allergies: Patient has no known allergies.  Medications: Prior to Admission medications   Medication Sig Start Date End Date Taking? Authorizing Provider  FARXIGA 10 MG TABS tablet Take 10 mg by mouth daily.   Yes [provider]  metoprolol succinate (TOPROL-XL) 25 MG 24 hr tablet Take 1 tablet (25 mg total) by mouth at bedtime. START with 1/2 pill for one week, then increase to 1 full pill. Patient taking differently: Take 25 mg by mouth at bedtime. 10/18/21  Yes Jeanie Sewer, NP  dapagliflozin propanediol (FARXIGA) 5 MG TABS tablet Take 1 tablet (5 mg total) by mouth daily. Patient not taking: Reported on 11/07/2021 10/14/21   Jeanie Sewer, NP  Dulaglutide (TRULICITY) 1.5 OH/6.0VP SOPN Inject 1.5 mg into the skin once a week. Start after you run out of the 0.'75mg'$  dose x 2 each week. Patient not taking: Reported on 11/07/2021 10/14/21   Jeanie Sewer, NP  metFORMIN (GLUCOPHAGE-XR) 500 MG 24 hr tablet TAKE 1 TABLET BY MOUTH EVERY DAY WITH BREAKFAST Patient not taking: Reported on 11/07/2021 10/14/21   Jeanie Sewer, NP  Omega-3 Fatty Acids (FISH OIL PO) Take 2 capsules by mouth daily. Patient not taking: Reported on 11/07/2021    [provider]  telmisartan-hydrochlorothiazide (MICARDIS HCT) 80-25 MG tablet Take 1 tablet by mouth daily. Patient not taking: Reported on 11/07/2021    [provider]  varenicline (CHANTIX) 0.5 MG tablet INCREASE TO 2 TABS IN AM, 1 TAB IN PM FOR 1-2 WEEKS, THEN 2 TABS AM  AND 2 TABS PM. Patient not taking: Reported on 11/07/2021 10/08/21   Jeanie Sewer, NP     Vital Signs: BP 112/77 (BP Location: Left Arm)   Pulse (!) 106   Temp 98.9 F (37.2 C) (Oral)   Resp 20   Ht 5' 6.93" (1.7 m)   Wt 258 lb 2.5 oz (117.1 kg)   SpO2 98%   BMI 40.52 kg/m   Physical Exam Constitutional:      General: He is not in acute distress. Cardiovascular:     Rate and Rhythm: Regular rhythm. Tachycardia present.     Heart sounds: Normal heart sounds.  Pulmonary:     Effort: Pulmonary effort is normal.     Breath sounds: Normal breath sounds.  Neurological:     Mental Status: He is alert and oriented to person, place, and time.  Psychiatric:        Behavior: Behavior normal.   LLQ drain intact, insertion site has no erythema, induration, or drainage; output is approximately 25 cc turbid, light brown fluid at time of exam  Imaging: No results found.  Labs:  CBC: Recent Labs    11/30/21 0342 12/01/21 1130 12/01/21 1326 12/02/21 1131  WBC 9.5 8.1 9.1 9.9  HGB 8.3* 7.0* 8.4* 9.1*  HCT 26.3* 22.5* 26.9* 29.0*  PLT 179 165 198 264     COAGS: Recent Labs    11/08/21 0822 11/09/21 0616 11/12/21 1327  INR 1.6* 2.1* 1.3*     BMP: Recent Labs    11/29/21  5397 11/30/21 0342 12/01/21 0436 12/02/21 0341  NA 135 135 136 136  K 4.2 4.4 4.9 4.8  CL 104 107 107 107  CO2 '24 22 23 23  '$ GLUCOSE 126* 128* 119* 122*  BUN 31* 26* 25* 23  CALCIUM 8.0* 8.4* 8.7* 8.8*  CREATININE 1.13 1.07 0.95 1.12  GFRNONAA >60 >60 >60 >60     LIVER FUNCTION TESTS: Recent Labs    11/29/21 0535 11/30/21 0342 12/01/21 0436 12/02/21 0341  BILITOT 0.6 0.7 0.5 0.6  AST 34 35 29 31  ALT '18 19 18 17  '$ ALKPHOS 33* 38 39 39  PROT 6.4* 6.7 6.9 7.1  ALBUMIN 1.7* 1.8* 1.8* 1.8*     Assessment and Plan: 66 yo male known to IR from previous image-guided drain placements which were intra-operatively removed during exploratory laparotomy on 6/20. Pt subsequently  developed AMS, tachycardia, tachypnea, and leukocytosis. Patient was transferred to the ICU and Dr Kathlene Cote was consulted for a recurrent abscess. A LLQ drain was placed on 6/24; now on regular floor; afebrile; WBC nl, hgb trending up, creat nl; drain fl cx- few e coli. Pt stated during exam that they might be discharged this weekend; orders for follow-up with IR have been placed.  Drain Location: LLQ Size: Fr size: 12 Fr Date of placement: 11/27/21  Currently to: Drain collection device: suction bulb 24 hour output:  Output by Drain (mL) 11/30/21 0701 - 11/30/21 1900 11/30/21 1901 - 12/01/21 0700 12/01/21 0701 - 12/01/21 1900 12/01/21 1901 - 12/02/21 0700 12/02/21 0701 - 12/02/21 1326  Closed System Drain 1 Right;Medial RLQ Bulb (JP) 19 Fr. '12 10 20  20  '$ Closed System Drain 1 Left;Anterior Abdomen Bulb (JP) 12 Fr. 30  30  35  Negative Pressure Wound Therapy Abdomen Medial  25   0      Current examination:  Insertion site unremarkable. Suture and stat lock in place. Dressed appropriately.   Plan: Continue TID flushes with 5 cc NS. Record output Q shift. Dressing changes QD or PRN if soiled.  Call IR APP or on call IR MD if difficulty flushing or sudden change in drain output.  Repeat imaging/possible drain injection once output < 10 mL/QD (excluding flush material). Consideration for drain removal if output is < 10 mL/QD (excluding flush material), pending discussion with the providing surgical service.  Discharge planning: Please contact IR APP or on call IR MD prior to patient d/c to ensure appropriate follow up plans are in place. Typically patient will follow up with IR clinic 10-14 days post d/c for repeat imaging/possible drain injection. IR scheduler will contact patient with date/time of appointment. Patient will need to flush drain QD with 5 cc NS, record output QD, dressing changes every 2-3 days or earlier if soiled.   IR will continue to follow - please call with questions or  concerns.      Electronically Signed: Lura Em, PA-C 12/02/2021, 1:26 PM   I spent a total of 15 Minutes at the the patient's bedside AND on the patient's hospital floor or unit, greater than 50% of which was counseling/coordinating care for left lower abdominal abscess drain    Patient ID: Robert Larson, male   DOB: 1955-12-05, 66 y.o.   MRN: [id number]

## 2021-12-02 NOTE — Progress Notes (Signed)
Physical Therapy Treatment Patient Details Name: Robert Larson MRN: [id number] DOB: 1955-07-18 Today's Date: 12/02/2021   History of Present Illness 66 year old with history of DM2, HTN, CKD admitted to the hospital for severe sepsis due to intra-abdominal abscess.  CT abdomen pelvis showed multiple loculated fluid collection in the lower abdomen with a large cavity in the pelvis measuring 11.5 X6X 11.7 cm.  Due to septic shock he was transferred to the ICU on 6/5.  Patient was seen by IR and drain was placed.  Hospital course was also complicated by acute kidney injury, hypoxia and atrial fibrillation with RVR.  Repeat CT showed resolution of abscess or the drain is in place but does have other pockets of fluid collection.  Patient is also on Cardizem drip, slowly uptitrating beta-blocker and heparin drip.   Repeat CT showed persistent abscess patient had drain placed 11/18/2021 by interventional radiology. 6/20 s/p Cystoscopy with bilateral retrograde pyelogram and bilateral open-ended ureteral catheter placement, exploratory laparotomy, drainage of pelvic abscess, wound vac. Pt transferred to ICU on 6/24 due to sepsis.    PT Comments    Pt progressing well with therapy. Pt completed ambulation and transfers. Currently pt requires Min assist for supine to sit, Max assist for sit to supine during bed mobility due to abd pain. For transfers pt requires Mod assist, and Min assist for gait. Pt ambulated ~ 49' with 3 seated rest breaks due to fatigue. Pt will benefit from continuing skilled therapy to address above impairments, recommending HHPT. Discussed with daughter who also supports decision for HHPT. Will continue treating in acute setting.     Recommendations for follow up therapy are one component of a multi-disciplinary discharge planning process, led by the attending physician.  Recommendations may be updated based on patient status, additional functional criteria and insurance  authorization.  Follow Up Recommendations  Home health PT     Assistance Recommended at Discharge Frequent or constant Supervision/Assistance  Patient can return home with the following A lot of help with walking and/or transfers;A lot of help with bathing/dressing/bathroom;Assistance with cooking/housework;Direct supervision/assist for medications management;Direct supervision/assist for financial management;Assist for transportation;Help with stairs or ramp for entrance   Equipment Recommendations  None recommended by PT    Recommendations for Other Services       Precautions / Restrictions Precautions Precautions: Fall Precaution Comments: Rt/Lt JP drain, VAC, PICC Restrictions Weight Bearing Restrictions: No     Mobility  Bed Mobility Overal bed mobility: Needs Assistance Bed Mobility: Supine to Sit     Supine to sit: Min assist, HOB elevated, +2 for safety/equipment Sit to supine: Max assist, +2 for physical assistance, HOB elevated   General bed mobility comments: More assist needed for sit to supine then vice versa. Sit to supine pt required 1st therapist behind pt's trunk while 2nd therapist lifting Bil legs to foot of bed, pt pulled himself up using bedrail. For supine to sit pt able to transition to EOB from supine using his method, VC's needed for hand and trunk placement on bed.    Transfers Overall transfer level: Needs assistance Equipment used: Rolling walker (2 wheels) Transfers: Sit to/from Stand Sit to Stand: Mod assist, +2 safety/equipment           General transfer comment: Pt completed several sit to stands throughout gait and from bed. Required trunk lift assist for powerup. Pt disregarded VC's for hand placement on arm rest for powerup.    Ambulation/Gait Ambulation/Gait assistance: Min assist, +2 safety/equipment Gait Distance (  Feet): 90 Feet Assistive device: Rolling walker (2 wheels) Gait Pattern/deviations: Step-through pattern, Decreased  stride length, Trunk flexed, Narrow base of support, Antalgic Gait velocity: decr     General Gait Details: Pt ambulated with RW and chair follow up for safety. Pt needed 4 rest breaks to sit in chair during ambulation. Relied on VC's for safe positioning with RW and posture. Pt reported possibly needing bathroom but didn't go.   Stairs             Wheelchair Mobility    Modified Rankin (Stroke Patients Only)       Balance Overall balance assessment: Needs assistance Sitting-balance support: Single extremity supported Sitting balance-Leahy Scale: Fair Sitting balance - Comments: Pt able to maintain balance in sitting as long as he's holding onto bedrail, therapist hand,etc.   Standing balance support: Bilateral upper extremity supported, During functional activity, Reliant on assistive device for balance Standing balance-Leahy Scale: Fair Standing balance comment: Pt is limited by abdominal pain occuring d/t stretching abdomen for standing. Relies on RW for external support.                            Cognition Arousal/Alertness: Awake/alert Behavior During Therapy: WFL for tasks assessed/performed Overall Cognitive Status: Within Functional Limits for tasks assessed                                 General Comments: Pt alert and focused on task.        Exercises      General Comments General comments (skin integrity, edema, etc.): Pt's skin on hands, face, and arms is peeling/dry. Daughter addresses issue with body lotion application. Pressure sore spot noted on pt's buttocks area, sacral bandage replaced.      Pertinent Vitals/Pain Pain Assessment Pain Assessment: 0-10 Pain Score: 8  Pain Location: abdomen Pain Descriptors / Indicators: Guarding, Sore, Tender, Tiring Pain Intervention(s): Monitored during session, Patient requesting pain meds-RN notified    Home Living                          Prior Function             PT Goals (current goals can now be found in the care plan section) Acute Rehab PT Goals Patient Stated Goal: To go home. PT Goal Formulation: With patient/family Time For Goal Achievement: 12/13/21 Potential to Achieve Goals: Good Progress towards PT goals: Progressing toward goals    Frequency    Min 3X/week      PT Plan Current plan remains appropriate    Co-evaluation     PT goals addressed during session: Mobility/safety with mobility        AM-PAC PT "6 Clicks" Mobility   Outcome Measure  Help needed turning from your back to your side while in a flat bed without using bedrails?: A Lot Help needed moving from lying on your back to sitting on the side of a flat bed without using bedrails?: A Lot Help needed moving to and from a bed to a chair (including a wheelchair)?: A Little Help needed standing up from a chair using your arms (e.g., wheelchair or bedside chair)?: A Little Help needed to walk in hospital room?: A Little Help needed climbing 3-5 steps with a railing? : A Lot 6 Click Score: 15    End of Session Equipment  Utilized During Treatment: Gait belt Activity Tolerance: Patient tolerated treatment well Patient left: in bed;with call bell/phone within reach;with family/visitor present (Daughter) Nurse Communication: Mobility status PT Visit Diagnosis: Unsteadiness on feet (R26.81);Other abnormalities of gait and mobility (R26.89);Muscle weakness (generalized) (M62.81);Pain Pain - part of body:  (Abdomen)     Time: 4287-6811 PT Time Calculation (min) (ACUTE ONLY): 49 min  Charges:  $Gait Training: 23-37 mins $Therapeutic Activity: 8-22 mins                     Margie Ege, SPT [place] 12/02/2021, 3:32 PM

## 2021-12-02 NOTE — Progress Notes (Signed)
Patient had atrial fibrillation in the setting of acute illness and sepsis requiring intubation.  He has now not had any atrial fibrillation in many days.  Echo showed normal systolic function and no left atrial or right atrial enlargement.  Plan for 30-day monitor at discharge.  For now, we will not start anticoagulation.  Ryker Sudbury C. Oval Linsey, MD, [facility] 12/02/2021 1:48 PM

## 2021-12-02 NOTE — Progress Notes (Signed)
[facility] for Infectious Disease  Date of Admission:  11/07/2021           Reason for visit: Follow up on intra-abdominal infection   Current antibiotics: Zosyn Micafungin  ASSESSMENT:    66 y.o. male admitted with:  #Intra-abdominal abscess -Initially managed with IR drain placement 6/5 and 6/15 with cultures growing E. coli, anaerobes, and Candida tropicalis -Status post ex lap and drainage of pelvic abscess 6/20 (no new cultures).  Found to have a good portion of his abdomen frozen at that time so unable to safely undergo resection of the sigmoid colon or diversion -Repeat CT scan 6/24 given clinical instability showed a interval development of large loculated fluid collection status post IR drainage.  Cultures from this growing E. coli, Bacteroides, and Candida albicans. -Continues to have drainage from his IR and surgical drain. -Nutrition intake is improving and plan is for hopeful discharge in the next couple days.  #Sepsis -Due to above and overall improved  #Type 2 diabetes -Hemoglobin A1c is 6.1   RECOMMENDATIONS:    Continue micafungin and Zosyn We will coordinate OPAT in anticipation of discharge See OPAT note below Continue wound care, nutrition, glycemic control Will need IR follow up outpatient as well pending drain output with interval imaging Please call with any further questions   Diagnosis: Intra-abdominal abscess  Culture Result:  E. Coli Anaerobes Candida tropicalis Candida albicans  No Known Allergies  OPAT Orders Discharge antibiotics to be given via PICC line Discharge antibiotics: Per pharmacy protocol   -Ertapenem -Caspafungin  Duration: 4 weeks  End Date: 12/25/21  [facility] Care Per Protocol:  Home health RN for IV administration and teaching; PICC line care and labs.    Labs weekly while on IV antibiotics: __xx CBC with differential __xx BMP __ CMP __ CRP __ ESR __ Vancomycin trough __ CK  _xx_ Please pull  PIC at completion of IV antibiotics __ Please leave PIC in place until doctor has seen patient or been notified  Fax weekly labs to [phone]  Clinic Follow Up Appt: 12/21/21 at 845am with Juleen China   Principal Problem:   Intra-abdominal abscesses s/p perc drainage 11/08/2021 Active Problems:   Essential hypertension   Type 2 diabetes mellitus with morbid obesity ([place])   Diverticulosis   Acute renal failure superimposed on stage 3a chronic kidney disease (HCC)   OSA (obstructive sleep apnea)   Osteoarthritis   Adrenal nodule ([place])   History of adenomatous polyp of colon   Spinal stenosis of lumbar region   Renal cyst   Cholelithiasis   Tobacco abuse   Pressure injury of skin   GERD (gastroesophageal reflux disease)   Alteration in electrolyte and fluid balance    MEDICATIONS:    Scheduled Meds:  amLODipine  10 mg Oral Daily   Chlorhexidine Gluconate Cloth  6 each Topical Daily   docusate sodium  100 mg Oral BID   feeding supplement  1 Container Oral Q24H   heparin injection (subcutaneous)  5,000 Units Subcutaneous Q8H   insulin aspart  0-15 Units Subcutaneous Q6H   lip balm  1 application  Topical BID   mouth rinse  15 mL Mouth Rinse q12n4p   metoprolol succinate  25 mg Oral Daily   mirtazapine  7.5 mg Oral QHS   nutrition supplement (JUVEN)  1 packet Oral BID BM   pantoprazole  40 mg Oral Daily   polyethylene glycol  17 g Oral Daily   Ensure Max  Protein  11 oz Oral Daily   sodium chloride flush  10-40 mL Intracatheter Q12H   sodium chloride flush  5 mL Intracatheter Q8H   Continuous Infusions:  sodium chloride 250 mL (11/23/21 1629)   sodium chloride Stopped (11/09/21 1144)   sodium chloride 10 mL/hr at 11/29/21 1452   sodium chloride     micafungin (MYCAMINE) 100 mg in sodium chloride 0.9 % 100 mL IVPB 100 mg (12/01/21 1850)   ondansetron (ZOFRAN) IV     piperacillin-tazobactam (ZOSYN)  IV 3.375 g (12/02/21 0623)   TPN ADULT (ION) 95 mL/hr at 12/01/21 1754    TPN ADULT (ION)     PRN Meds:.Place/Maintain arterial line **AND** sodium chloride, sodium chloride, Place/Maintain arterial line **AND** sodium chloride, acetaminophen **OR** [DISCONTINUED] acetaminophen, alum & mag hydroxide-simeth, hydrALAZINE, ipratropium-albuterol, magic mouthwash, menthol-cetylpyridinium, methocarbamol, morphine injection, ondansetron (ZOFRAN) IV **OR** ondansetron (ZOFRAN) IV, mouth rinse, oxyCODONE, phenol, prochlorperazine, senna-docusate, simethicone, sodium chloride flush  SUBJECTIVE:   24 hour events:  No acute events noted Afebrile Nutrition intake improving Continues to have significant drain output Discussed with surgery has plans for discharge in the next couple days  Patient seen sitting up at the bedside being shaved by his daughter.  He appears better today.  He has a PICC line in place.  He is hopeful for going home soon.  Review of Systems  All other systems reviewed and are negative.     OBJECTIVE:   Blood pressure (!) 142/75, pulse 75, temperature 98.2 F (36.8 C), resp. rate 20, height 5' 6.93" (1.7 m), weight 117.1 kg, SpO2 96 %. Body mass index is 40.52 kg/m.  Physical Exam Constitutional:      General: He is not in acute distress.    Appearance: Normal appearance.  HENT:     Head: Normocephalic and atraumatic.  Eyes:     Extraocular Movements: Extraocular movements intact.     Conjunctiva/sclera: Conjunctivae normal.  Pulmonary:     Effort: Pulmonary effort is normal. No respiratory distress.  Musculoskeletal:     Cervical back: Neck supple.     Comments: PICC line is in place.   Skin:    General: Skin is dry.  Neurological:     General: No focal deficit present.     Mental Status: He is alert and oriented to person, place, and time.  Psychiatric:        Mood and Affect: Mood normal.        Behavior: Behavior normal.      Lab Results: Lab Results  Component Value Date   WBC 9.1 12/01/2021   HGB 8.4 (L) 12/01/2021    HCT 26.9 (L) 12/01/2021   MCV 102.3 (H) 12/01/2021   PLT 198 12/01/2021    Lab Results  Component Value Date   NA 136 12/02/2021   K 4.8 12/02/2021   CO2 23 12/02/2021   GLUCOSE 122 (H) 12/02/2021   BUN 23 12/02/2021   CREATININE 1.12 12/02/2021   CALCIUM 8.8 (L) 12/02/2021   GFRNONAA >60 12/02/2021   GFRAA >60 02/19/2018    Lab Results  Component Value Date   ALT 17 12/02/2021   AST 31 12/02/2021   ALKPHOS 39 12/02/2021   BILITOT 0.6 12/02/2021    No results found for: "CRP"  No results found for: "ESRSEDRATE"   I have reviewed the micro and lab results in Epic.  Imaging: No results found.   Imaging independently reviewed in Epic.    [place] [facility] Infectious  D[facility] [place] [facility] [phone] pager 12/02/2021, 11:12 AM

## 2021-12-02 NOTE — TOC Progression Note (Addendum)
Transition of Care [facility]) - Progression Note    Patient Details  Name: Robert Larson MRN: [id number] Date of Birth: [birth year]  [facility]) CM/SW Contact  Alhaji Mcneal, Juliann Pulse, RN Phone Number: 12/02/2021, 2:58 PM  Clinical Narrative:  Roel Cluck rep Pam following for long term iv abx if needed. TOC to follow w/Pam for [place] agency-[facility] only [facility] care.iv abx,possible abscess drain care instruction . TOC to f/u on [facility] agency for HHPT.Marissa(dtr).Address @ d/c:[address] [address].    Expected Discharge Plan: [place] Barriers to Discharge: Continued Medical Work up  Expected Discharge Plan and Services Expected Discharge Plan: [place]                                               Social Determinants of Health (SDOH) Interventions    Readmission Risk Interventions     No data to display

## 2021-12-02 NOTE — Progress Notes (Signed)
PHARMACY - TOTAL PARENTERAL NUTRITION CONSULT NOTE   Indication: Prolonged ileus  Patient Measurements: Height: 5' 6.93" (170 cm) Weight: 117.1 kg (258 lb 2.5 oz) IBW/kg (Calculated) : 65.94 TPN AdjBW (KG): 79.8 Body mass index is 40.52 kg/m. Usual Weight:   Assessment:  66 yo male admitted for sepsis d/t multiple diverticular IAAs. Failed conservative measures and underwent ex lap 6/20. Unable to perform colectomy or diversion d/t extensively frozen bowel. LLQ drain placed by IR on 6/24. Pharmacy consulted to dose TPN with prolonged ileus.  Glucose / Insulin: Hx DM on PTA metformin, SGLT2, GLP1 - CBGs goal <180. Range: 122 - 163 - 9 units SSI in 24 hrs, on mSSI q6h Electrolytes: K (4.8) remains on upper end of normal despite significantly reducing K in TPN. CorrCa (10.6) remains elevated after removing Ca from TPN. Other lytes WNL Renal: SCr stable. BUN now improved to WNL Hepatic: albumin low; LFTs, bili, TG all WNL  - new cirrhosis Dx on imaging this admission Intake / Output; MIVF:  - drain placed by IR 6/24, total output: 50 mL (decreasing) - 24 hr UOP: 3750 mL - PO intake: 640 mL (increasing) GI Surgeries / Procedures:  - 6/20 Pt post-op exploratory laparotomy and drainage of pelvic abscess. -6/24: CT guided drain placement for LLQ abscess by IR  Central access: 11/08/2021 TPN start date: 11/24/2021  Nutritional Goals: Goal TPN rate is 95 mL/hr (provides 130 g protein and 2366 kcals per day)  RD Assessment: Estimated Needs Total Energy Estimated Needs: 2300-2600 kcal Total Protein Estimated Needs: 115-130 grams Total Fluid Estimated Needs: >/= 2.4 L/day  Current Nutrition:  FLD, TPN at goal rate -Ensure Max daily, Juven BID, Boost Breeze daily,   Advancing diet. To CLD on 6/26 > FLD on 6/27 > soft diet 6/29 Mirtazapine started 6/28 for appetite stimulation  Plan:  Per discussion with CCS, will decrease TPN to 1/2 rate tonight  At 1800: Decrease TPN to 50  mL/hr Electrolytes in TPN: No changes. Reducing rate will reduce total amount given Na 100 mEq/L K 20 mEq/L Ca 0 mEq/L  Mg 5 mEq/L Phos 19 mmol/L Cl:Ac ratio 1:1 Add standard MVI and trace elements to TPN Continue mSSI q6h Monitor TPN labs on Mon/Thurs. Recheck lytes with AM labs tomorrow. Monitor for ability to wean off of TPN tomorrow.  Lenis Noon, PharmD, BCPS Clinical Pharmacist 12/02/2021 8:01 AM

## 2021-12-02 NOTE — Progress Notes (Signed)
PROGRESS NOTE    Robert Larson  BPZ:[id number] DOB: 08/29/1955 DOA: 11/07/2021 PCP: Jeanie Sewer, NP   Brief Narrative:  66 y/o male admitted on 6/4 in the setting of sepsis from multiple intra-abdominal abscesses to the hospitalists service.  He has been treated with antibiotics, intra-abdominal fluid drainage via percutaneous catheters, and supportive care.  In the setting of septic shock his course has been complicated by AKI, hypoxemia and atrial fibrillation with RVR.  He has had multiple drains placed, one from 6/18 grew candida tropicalis so ID was consulted.  On 6/20 he underwent an ex-lap and drainage of pelvic abscesses likely due to complicated diverticulitis by Dr. Donne Hazel.  Per surgery notes he was originally planned for at least a partial colectomy but noted intra-operatively to have a large portion of his abdomen frozen so bowel resection was not possible.  A 19 French drain was placed, IR drains removed.  Urology also participated in the procedure and performed bilateral retrograde pyelogram and bilateral open-ended ureteral catheter placement.   PCCM was consulted on 6/24 around 2 AM due to a change in mental status, worsening tachycardia and tachypnea.  Transferred to ICU, intubated.  Significant Hospital Events: Including procedures, antibiotic start and stop dates in addition to other pertinent events   6/4 admission, started zosyn 6/5 moved to ICU septic shock, IR drain placed in abdomen, grew e-coli and mixed anaerobic fluid 6/13 drain removed by IR  6/14 recurrent pelvic abscess on CT abdomen 6/15 2 new drains placed by IR in lower abdomen bilaterally, grew candida tropicalis 6/17 ID consult: micafungin 6/20 ex-lap, bilateral open ended ureteral catheter placement, drain placed in pelvic fluid collection 6/24 pccm consulted for confusion, fever tachycardia, remains on zosyn, intubated for airway protection, increased work of breathing with confusion.  Respiratory  alkalosis on ABG, lactic acid elevated at 3.7. daughter confirms full code status by phone. 6/25 extubated. A line removed. 6/26 vanc stopped 6/27, transferred under TRH. Assessment & Plan:   Principal Problem:   Intra-abdominal abscesses s/p perc drainage 11/08/2021 Active Problems:   Alteration in electrolyte and fluid balance   Essential hypertension   Type 2 diabetes mellitus with morbid obesity ([place])   Diverticulosis   Acute renal failure superimposed on stage 3a chronic kidney disease (HCC)   OSA (obstructive sleep apnea)   Osteoarthritis   Adrenal nodule (HCC)   History of adenomatous polyp of colon   Spinal stenosis of lumbar region   Renal cyst   Cholelithiasis   Tobacco abuse   Pressure injury of skin   GERD (gastroesophageal reflux disease)  Sepsis (abd) Multiple intra-abdominal abscesses: Initially managed with IR drain placement 6/5 and 6/15 with cultures growing E. coli, anaerobes, and Candida tropicalis.  Now status post exploratory laparotomy and drainage of pelvic abscess with general surgery 11/23/2021 (no new cultures) and found to have a good portion of his abdomen frozen at that time so unable to safely undergo resection of the sigmoid colon or diversion.  Repeat CT scan 6/24 given clinical instability showed a interval development of a large loculated fluid collection (5.3 x 9.9 x 21 cm) status post IR drain placement.  Gram stain with GNRs and cultures growing pansensitive E. coli.  Patient currently on Zosyn and micafungin, managed by ID.  Vancomycin stopped on 11/29/2021.  Pain is almost resolved.  He is tolerating full liquid diet and surgery has advance to soft diet today.  Drains are managed by IR. He is improving.  TPN continues as well.  Per general surgery, patient improving and on the path to likely discharge over the weekend.  ID has recommended ertapenem and caspofungin at the time of discharge with end date on 12/25/2021.  General surgery has now cleared the  patient to receive full anticoagulation starting with heparin drip.  Paroxysmal atrial fibrillation: During this hospitalization, due to acute sickness, sepsis, patient had developed atrial fibrillation with RVR around 11/11/2021.  On last note of cardiology, patient had recommended transitioning to oral anticoagulation from IV heparin once general surgery has cleared the patient.  In the interim, patient needed multiple procedures so full anticoagulation was held and he was only on heparin DVT prophylaxis dose.  General surgery has now cleared him to receive full anticoagulation however I repeated his EKG today which shows sinus rhythm.  He has remained in sinus rhythm for last 2 days.  Since this was transient and could very well be due to just the sepsis, I wonder if anticoagulation are not indicated anymore.  To get further recommendations, I have asked Dr. Oval Linsey of cardiology to either review the patient's chart or see patient and provide recommendations.  In the interim, I will start him on heparin drip.  Acute respiratory failure with hypoxemia w/ concern for aspiration: Needed intubation on 11/27/2021 and successfully extubated 6/25.  Currently no complaints and saturating over 90% on room air.  Continue incentive spirometry.  Acute kidney injury: Patient does not appear to have CKD per my chart review but it is documented at several notes that he had CKD.  CKD ruled out.  He had AKI which is resolved.  Essential hypertension: PTA he was on telmisartan and hydrochlorothiazide which are on hold due to AKI.  Started on amlodipine 5 mg yesterday, blood pressure is elevated, will increase to 10 mg.  Continue Toprol-XL.  Type 2 diabetes mellitus: At home takes Trulicity and metformin which are on hold.  Blood sugar controlled.  Continue SSI.  GERD: Continue PPI.  DVT prophylaxis: heparin injection 5,000 Units Start: 11/27/21 0600 Place TED hose Start: 11/21/21 0950 SCDs Start: 11/07/21 1811    Code Status: Full Code  Family Communication: Daughter present at bedside.  Plan of care discussed with patient in length and he/she verbalized understanding and agreed with it.  Status is: Inpatient Remains inpatient appropriate because: Advancing diet, will be discharged when cleared by general surgery.   Estimated body mass index is 40.52 kg/m as calculated from the following:   Height as of this encounter: 5' 6.93" (1.7 m).   Weight as of this encounter: 117.1 kg.  Pressure Injury 11/27/21 Ischial tuberosity Left Stage 3 -  Full thickness tissue loss. Subcutaneous fat may be visible but bone, tendon or muscle are NOT exposed. S3 2x3 (Active)  11/27/21 0800  Location: Ischial tuberosity  Location Orientation: Left  Staging: Stage 3 -  Full thickness tissue loss. Subcutaneous fat may be visible but bone, tendon or muscle are NOT exposed.  Wound Description (Comments): S3 2x3  Present on Admission: Yes  Dressing Type Foam - Lift dressing to assess site every shift 11/29/21 2002     Pressure Injury 11/27/21 Ischial tuberosity Right Stage 3 -  Full thickness tissue loss. Subcutaneous fat may be visible but bone, tendon or muscle are NOT exposed. S3 1x1 (Active)  11/27/21 0800  Location: Ischial tuberosity  Location Orientation: Right  Staging: Stage 3 -  Full thickness tissue loss. Subcutaneous fat may be visible but bone, tendon or muscle are NOT exposed.  Wound  Description (Comments): S3 1x1  Present on Admission: Yes  Dressing Type Foam - Lift dressing to assess site every shift 11/29/21 2002     Pressure Injury 11/27/21 Buttocks Right Stage 3 -  Full thickness tissue loss. Subcutaneous fat may be visible but bone, tendon or muscle are NOT exposed. 1x1 (Active)  11/27/21 0800  Location: Buttocks  Location Orientation: Right  Staging: Stage 3 -  Full thickness tissue loss. Subcutaneous fat may be visible but bone, tendon or muscle are NOT exposed.  Wound Description (Comments):  1x1  Present on Admission: Yes  Dressing Type Foam - Lift dressing to assess site every shift 11/29/21 2002     Pressure Injury 11/27/21 Sacrum Right;Left Deep Tissue Pressure Injury - Purple or maroon localized area of discolored intact skin or blood-filled blister due to damage of underlying soft tissue from pressure and/or shear. L 9x6   R 11x7 (Active)  11/27/21 0800  Location: Sacrum  Location Orientation: Right;Left  Staging: Deep Tissue Pressure Injury - Purple or maroon localized area of discolored intact skin or blood-filled blister due to damage of underlying soft tissue from pressure and/or shear.  Wound Description (Comments): L 9x6   R 11x7  Present on Admission: Yes  Dressing Type Foam - Lift dressing to assess site every shift 11/29/21 0740   Nutritional Assessment: Body mass index is 40.52 kg/m.Marland Kitchen Seen by dietician.  I agree with the assessment and plan as outlined below: Nutrition Status: Nutrition Problem: Increased nutrient needs Etiology: acute illness, post-op healing, wound healing Signs/Symptoms: estimated needs Interventions: Boost Breeze, Premier Protein, Juven, Education, TPN  . Skin Assessment: I have examined the patient's skin and I agree with the wound assessment as performed by the wound care RN as outlined below: Pressure Injury 11/27/21 Ischial tuberosity Left Stage 3 -  Full thickness tissue loss. Subcutaneous fat may be visible but bone, tendon or muscle are NOT exposed. S3 2x3 (Active)  11/27/21 0800  Location: Ischial tuberosity  Location Orientation: Left  Staging: Stage 3 -  Full thickness tissue loss. Subcutaneous fat may be visible but bone, tendon or muscle are NOT exposed.  Wound Description (Comments): S3 2x3  Present on Admission: Yes  Dressing Type Foam - Lift dressing to assess site every shift 11/29/21 2002     Pressure Injury 11/27/21 Ischial tuberosity Right Stage 3 -  Full thickness tissue loss. Subcutaneous fat may be visible but  bone, tendon or muscle are NOT exposed. S3 1x1 (Active)  11/27/21 0800  Location: Ischial tuberosity  Location Orientation: Right  Staging: Stage 3 -  Full thickness tissue loss. Subcutaneous fat may be visible but bone, tendon or muscle are NOT exposed.  Wound Description (Comments): S3 1x1  Present on Admission: Yes  Dressing Type Foam - Lift dressing to assess site every shift 11/29/21 2002     Pressure Injury 11/27/21 Buttocks Right Stage 3 -  Full thickness tissue loss. Subcutaneous fat may be visible but bone, tendon or muscle are NOT exposed. 1x1 (Active)  11/27/21 0800  Location: Buttocks  Location Orientation: Right  Staging: Stage 3 -  Full thickness tissue loss. Subcutaneous fat may be visible but bone, tendon or muscle are NOT exposed.  Wound Description (Comments): 1x1  Present on Admission: Yes  Dressing Type Foam - Lift dressing to assess site every shift 11/29/21 2002     Pressure Injury 11/27/21 Sacrum Right;Left Deep Tissue Pressure Injury - Purple or maroon localized area of discolored intact skin or blood-filled blister  due to damage of underlying soft tissue from pressure and/or shear. L 9x6   R 11x7 (Active)  11/27/21 0800  Location: Sacrum  Location Orientation: Right;Left  Staging: Deep Tissue Pressure Injury - Purple or maroon localized area of discolored intact skin or blood-filled blister due to damage of underlying soft tissue from pressure and/or shear.  Wound Description (Comments): L 9x6   R 11x7  Present on Admission: Yes  Dressing Type Foam - Lift dressing to assess site every shift 11/29/21 0740    Consultants:  General surgery IR ID PCCM-signed off Procedures:  As above  Antimicrobials:  Anti-infectives (From admission, onward)    Start     Dose/Rate Route Frequency Ordered Stop   11/30/21 2200  piperacillin-tazobactam (ZOSYN) IVPB 3.375 g        3.375 g 12.5 mL/hr over 240 Minutes Intravenous Every 8 hours 11/30/21 1435     11/29/21 0600   vancomycin (VANCOREADY) IVPB 1250 mg/250 mL  Status:  Discontinued        1,250 mg 166.7 mL/hr over 90 Minutes Intravenous Every 24 hours 11/28/21 0759 11/29/21 0727   11/28/21 0500  vancomycin (VANCOCIN) IVPB 1000 mg/200 mL premix  Status:  Discontinued        1,000 mg 200 mL/hr over 60 Minutes Intravenous Every 24 hours 11/27/21 0414 11/28/21 0759   11/27/21 0600  meropenem (MERREM) 1 g in sodium chloride 0.9 % 100 mL IVPB  Status:  Discontinued        1 g 200 mL/hr over 30 Minutes Intravenous Every 8 hours 11/27/21 0346 11/30/21 1435   11/27/21 0430  vancomycin (VANCOREADY) IVPB 2000 mg/400 mL        2,000 mg 200 mL/hr over 120 Minutes Intravenous  Once 11/27/21 0346 11/27/21 0655   11/24/21 1400  piperacillin-tazobactam (ZOSYN) IVPB 3.375 g  Status:  Discontinued        3.375 g 12.5 mL/hr over 240 Minutes Intravenous Every 8 hours 11/24/21 1018 11/27/21 0323   11/24/21 1200  Ampicillin-Sulbactam (UNASYN) 3 g in sodium chloride 0.9 % 100 mL IVPB  Status:  Discontinued        3 g 200 mL/hr over 30 Minutes Intravenous Every 6 hours 11/24/21 0900 11/24/21 1018   11/23/21 0830  piperacillin-tazobactam (ZOSYN) IVPB 3.375 g  Status:  Discontinued        3.375 g 12.5 mL/hr over 240 Minutes Intravenous Every 8 hours 11/23/21 0736 11/24/21 0858   11/22/21 1200  Ampicillin-Sulbactam (UNASYN) 3 g in sodium chloride 0.9 % 100 mL IVPB  Status:  Discontinued        3 g 200 mL/hr over 30 Minutes Intravenous Every 6 hours 11/22/21 0923 11/23/21 0736   11/20/21 1645  micafungin (MYCAMINE) 100 mg in sodium chloride 0.9 % 100 mL IVPB        100 mg 105 mL/hr over 1 Hours Intravenous Every 24 hours 11/20/21 1554     11/08/21 0730  vancomycin (VANCOREADY) IVPB 1750 mg/350 mL        1,750 mg 175 mL/hr over 120 Minutes Intravenous  Once 11/08/21 0641 11/08/21 0908   11/08/21 0641  vancomycin variable dose per unstable renal function (pharmacist dosing)  Status:  Discontinued         Does not apply See  admin instructions 11/08/21 0641 11/08/21 1138   11/07/21 2000  piperacillin-tazobactam (ZOSYN) IVPB 3.375 g  Status:  Discontinued        3.375 g 12.5 mL/hr over 240 Minutes Intravenous  Every 8 hours 11/07/21 1921 11/22/21 0923   11/07/21 1430  piperacillin-tazobactam (ZOSYN) IVPB 3.375 g        3.375 g 100 mL/hr over 30 Minutes Intravenous  Once 11/07/21 1418 11/07/21 1535         Subjective:  Patient seen and examined.  Very minimal abdominal pain.  Daughter at the bedside.  No other complaint.  Objective: Vitals:   12/01/21 2029 12/02/21 0540 12/02/21 1000 12/02/21 1223  BP: (!) 152/70 (!) 159/95 (!) 142/75 112/77  Pulse: 68 75  (!) 106  Resp: 20 20    Temp: 98.4 F (36.9 C) 98.2 F (36.8 C)  98.9 F (37.2 C)  TempSrc: Oral   Oral  SpO2: 100% 96%  98%  Weight:      Height:        Intake/Output Summary (Last 24 hours) at 12/02/2021 1316 Last data filed at 12/02/2021 1200 Gross per 24 hour  Intake 2204.33 ml  Output 4605 ml  Net -2400.67 ml    Filed Weights   11/21/21 0000 11/23/21 1041 11/27/21 0000  Weight: 121.5 kg 121.5 kg 117.1 kg    Examination:  General exam: Appears calm and comfortable, obese Respiratory system: Clear to auscultation. Respiratory effort normal. Cardiovascular system: S1 & S2 heard, RRR. No JVD, murmurs, rubs, gallops or clicks. No pedal edema. Gastrointestinal system: Abdomen is nondistended, soft and mild generalized tenderness. No organomegaly or masses felt. Normal bowel sounds heard. Central nervous system: Alert and oriented. No focal neurological deficits. Extremities: Symmetric 5 x 5 power. Skin: No rashes, lesions or ulcers.  Psychiatry: Judgement and insight appear normal. Mood & affect appropriate.   Data Reviewed: I have personally reviewed following labs and imaging studies  CBC: Recent Labs  Lab 11/29/21 1503 11/30/21 0342 12/01/21 1130 12/01/21 1326 12/02/21 1131  WBC 8.7 9.5 8.1 9.1 9.9  NEUTROABS 6.3  --   --    --   --   HGB 7.9* 8.3* 7.0* 8.4* 9.1*  HCT 25.2* 26.3* 22.5* 26.9* 29.0*  MCV 100.8* 100.4* 102.7* 102.3* 102.1*  PLT 146* 179 165 198 546    Basic Metabolic Panel: Recent Labs  Lab 11/26/21 0357 11/27/21 0209 11/27/21 1030 11/28/21 0500 11/29/21 0535 11/30/21 0342 12/01/21 0436 12/02/21 0341  NA 133* 133*   < > 136 135 135 136 136  K 4.3 4.8   < > 4.1 4.2 4.4 4.9 4.8  CL 103 106   < > 106 104 107 107 107  CO2 21* 17*   < > '24 24 22 23 23  '$ GLUCOSE 134* 172*   < > 144* 126* 128* 119* 122*  BUN 25* 32*   < > 38* 31* 26* 25* 23  CREATININE 1.45* 1.81*   < > 1.39* 1.13 1.07 0.95 1.12  CALCIUM 8.6* 8.4*   < > 8.3* 8.0* 8.4* 8.7* 8.8*  MG 1.8 1.7  --  2.1 2.0  --   --  1.9  PHOS 2.3* 1.9*  --  2.7 2.4* 2.7 3.3 3.6   < > = values in this interval not displayed.    GFR: Estimated Creatinine Clearance: 81.4 mL/min (by C-G formula based on SCr of 1.12 mg/dL). Liver Function Tests: Recent Labs  Lab 11/28/21 0500 11/29/21 0535 11/30/21 0342 12/01/21 0436 12/02/21 0341  AST 26 34 35 29 31  ALT '14 18 19 18 17  '$ ALKPHOS 33* 33* 38 39 39  BILITOT 0.6 0.6 0.7 0.5 0.6  PROT 6.0* 6.4* 6.7  6.9 7.1  ALBUMIN 1.6* 1.7* 1.8* 1.8* 1.8*    No results for input(s): "LIPASE", "AMYLASE" in the last 168 hours. No results for input(s): "AMMONIA" in the last 168 hours. Coagulation Profile: No results for input(s): "INR", "PROTIME" in the last 168 hours. Cardiac Enzymes: No results for input(s): "CKTOTAL", "CKMB", "CKMBINDEX", "TROPONINI" in the last 168 hours. BNP (last 3 results) No results for input(s): "PROBNP" in the last 8760 hours. HbA1C: No results for input(s): "HGBA1C" in the last 72 hours. CBG: Recent Labs  Lab 12/01/21 1211 12/01/21 1843 12/01/21 2359 12/02/21 0536 12/02/21 1157  GLUCAP 122* 163* 123* 127* 131*    Lipid Profile: Recent Labs    12/02/21 0341  TRIG 117    Thyroid Function Tests: No results for input(s): "TSH", "T4TOTAL", "FREET4", "T3FREE",  "THYROIDAB" in the last 72 hours. Anemia Panel: No results for input(s): "VITAMINB12", "FOLATE", "FERRITIN", "TIBC", "IRON", "RETICCTPCT" in the last 72 hours. Sepsis Labs: Recent Labs  Lab 11/27/21 0209 11/27/21 0456  LATICACIDVEN 3.6* 3.0*     Recent Results (from the past 240 hour(s))  Culture, blood (Routine X 2) w Reflex to ID Panel     Status: None   Collection Time: 11/27/21  2:09 AM   Specimen: BLOOD  Result Value Ref Range Status   Specimen Description   Final    BLOOD LEFT ANTECUBITAL Performed at [facility] [address] [place], [place] [address]    Special Requests   Final    BOTTLES DRAWN AEROBIC AND ANAEROBIC Blood Culture adequate volume Performed at [facility] [address] [place], [place] [address]    Culture   Final    NO GROWTH 5 DAYS Performed at [facility], [place] [address], [place], [place] [address]    Report Status 12/02/2021 FINAL  Final  Culture, blood (Routine X 2) w Reflex to ID Panel     Status: None   Collection Time: 11/27/21  2:11 AM   Specimen: BLOOD  Result Value Ref Range Status   Specimen Description   Final    BLOOD BLOOD LEFT HAND Performed at [facility] [address], [place], [place] [address]    Special Requests   Final    BOTTLES DRAWN AEROBIC AND ANAEROBIC Blood Culture adequate volume Performed at [facility] [address] [place], [place] [address]    Culture   Final    NO GROWTH 5 DAYS Performed at [facility], [place] [address], [place], [place] [address]    Report Status 12/02/2021 FINAL  Final  Aerobic/Anaerobic Culture w Gram Stain (surgical/deep wound)     Status: None   Collection Time: 11/27/21  1:55 PM   Specimen: Abscess  Result Value Ref Range Status   Specimen Description   Final    ABSCESS Performed at [facility] [address], [place], [place] [address]    Special Requests    Final    Normal Performed at [facility], [place] [address] [place], [place] [address]    Gram Stain   Final    ABUNDANT WBC PRESENT, PREDOMINANTLY PMN RARE GRAM NEGATIVE RODS    Culture   Final    FEW ESCHERICHIA COLI MODERATE CANDIDA ALBICANS FEW BACTEROIDES THETAIOTAOMICRON BETA LACTAMASE POSITIVE Performed at [facility], [place] [address], [place],  [address]    Report Status 12/01/2021 FINAL  Final   Organism ID, Bacteria ESCHERICHIA COLI  Final      Susceptibility   Escherichia coli - MIC*    AMPICILLIN 4 SENSITIVE Sensitive     CEFAZOLIN <=4 SENSITIVE Sensitive     CEFEPIME <=0.12 SENSITIVE Sensitive     CEFTAZIDIME <=1 SENSITIVE Sensitive     CEFTRIAXONE <=0.25 SENSITIVE Sensitive     CIPROFLOXACIN <=0.25 SENSITIVE Sensitive     GENTAMICIN <=1 SENSITIVE Sensitive     IMIPENEM <=0.25 SENSITIVE Sensitive     TRIMETH/SULFA <=20 SENSITIVE Sensitive     AMPICILLIN/SULBACTAM <=2 SENSITIVE Sensitive     PIP/TAZO <=4 SENSITIVE Sensitive     * FEW ESCHERICHIA COLI     Radiology Studies: No results found.  Scheduled Meds:  amLODipine  10 mg Oral Daily   Chlorhexidine Gluconate Cloth  6 each Topical Daily   docusate sodium  100 mg Oral BID   feeding supplement  1 Container Oral Q24H   heparin injection (subcutaneous)  5,000 Units Subcutaneous Q8H   insulin aspart  0-15 Units Subcutaneous Q6H   lip balm  1 application  Topical BID   metoprolol succinate  25 mg Oral Daily   mirtazapine  7.5 mg Oral QHS   nutrition supplement (JUVEN)  1 packet Oral BID BM   pantoprazole  40 mg Oral Daily   polyethylene glycol  17 g Oral Daily   Ensure Max Protein  11 oz Oral Daily   sodium chloride flush  10-40 mL Intracatheter Q12H   sodium chloride flush  5 mL Intracatheter Q8H   Continuous Infusions:  sodium chloride 250 mL (11/23/21 1629)   sodium chloride Stopped (11/09/21 1144)   sodium chloride 10 mL/hr at 11/29/21 1452   sodium chloride      micafungin (MYCAMINE) 100 mg in sodium chloride 0.9 % 100 mL IVPB 100 mg (12/01/21 1850)   ondansetron (ZOFRAN) IV     piperacillin-tazobactam (ZOSYN)  IV 3.375 g (12/02/21 0623)   TPN ADULT (ION) 95 mL/hr at 12/01/21 1754   TPN ADULT (ION)       LOS: 25 days   Darliss Cheney, MD [facility]  12/02/2021, 1:16 PM   *Please note that this is a verbal dictation therefore any spelling or grammatical errors are due to the "[employer]" system interpretation.  Please page via [place] and do not message via secure chat for urgent patient care matters. Secure chat can be used for non urgent patient care matters.  How to contact the [facility] Attending or Consulting provider [place] or covering provider during after hours [place], for this patient?  Check the care team in [facility] and look for a) attending/consulting [facility] provider listed and b) the [facility] team listed. Page or secure chat [phone]. Log into [email or web address] and use [place] universal password to access. If you do not have the password, please contact the hospital operator. Locate the [facility] provider you are looking for under [facility] and page to a number that you can be directly reached. If you still have difficulty reaching the provider, please page the [facility] (Director on Call) for the Hospitalists listed on amion for assistance.

## 2021-12-02 NOTE — Progress Notes (Incomplete)
ANTICOAGULATION CONSULT NOTE - Initial Consult  Pharmacy Consult for heparin Indication: atrial fibrillation  No Known Allergies  Patient Measurements: Height: 5' 6.93" (170 cm) Weight: 117.1 kg (258 lb 2.5 oz) IBW/kg (Calculated) : 65.94 Heparin Dosing Weight: 93 kg  Vital Signs: Temp: 98.9 F (37.2 C) (06/29 1223) Temp Source: Oral (06/29 1223) BP: 112/77 (06/29 1223) Pulse Rate: 106 (06/29 1223)  Labs: Recent Labs    11/30/21 0342 12/01/21 0436 12/01/21 1130 12/01/21 1326 12/02/21 0341 12/02/21 1131  HGB 8.3*  --  7.0* 8.4*  --  9.1*  HCT 26.3*  --  22.5* 26.9*  --  29.0*  PLT 179  --  165 198  --  264  CREATININE 1.07 0.95  --   --  1.12  --     Estimated Creatinine Clearance: 81.4 mL/min (by C-G formula based on SCr of 1.12 mg/dL).   Medical History: Past Medical History:  Diagnosis Date   Acute respiratory failure ([place]) 11/29/2021   Acute right-sided low back pain with bilateral sciatica 03/13/2017   Coffee ground emesis 11/08/2021   Contusion of left hip and thigh 02/13/2017   Hyponatremia 11/07/2021   Osteoarthritis    Protein in urine    Septic shock ([place]) 11/08/2021   Sleep apnea     Medications: Patient not on anticoagulants PTA  Assessment: Pt is a 38 yoM who has had a prolonged hospitalization with multiple intra-abdominal abscesses requiring drain placement with plans for partial colectomy which was not possible due to a large portion of abdomen being frozen. Patient developed new onset afib during hospitalization and was previously anticoagulated on a heparin drip which has been held since 6/22 when he had acute blood loss anemia and hematuria. Pt has been on HSQ for DVT ppx since 6/24. Pharmacy now consulted to resume heparin drip.  Significant Events: -Heparin infusion from 6/9 > 6/22 -HSQ from 6/24 > 6/29 -Resume heparin drip 6/29 >  Today, 12/02/21 CBC: Hgb remains low but stable, Plt WNL and trending up SCr improved to WNL  Of note, patient  was previously requiring high dose of heparin (29 units/kg/hr) prior to developing hematuria and holding anticoagulation. Will plan to re-initiate at lower dose per dosing nomogram.  Goal of Therapy:  Heparin level 0.3-0.7 units/ml Monitor platelets by anticoagulation protocol: Yes   Plan:  Heparin bolus? Initiate heparin infusion at 1450 units/hr Check 6 hour HL HL, CBC daily while on heparin infusion Monitor for signs of bleeding  Lenis Noon, PharmD 12/02/2021,1:38 PM

## 2021-12-02 NOTE — Progress Notes (Signed)
9 Days Post-Op  Subjective: CC: Doing well. Stable to improved left sided abdominal pain. No other areas of abdominal pain. Tolerating FLD more - drank 1/2 boost, several ginger ales, 1/2 cookout milkshake 1/2 Juven packet and ate a few puddings yesterday. No n/v. Passing flatus. 2 soft bm's two days ago but nothing yesterday. Mobilized in room, up to bedside commode and for a bath yesterday but was to tired to work with PT. Will try to work with them today. Voiding. Plans to stay with his Mom and has family in the area that can help - daughter and daughter in law.   Objective: Vital signs in last 24 hours: Temp:  [98.2 F (36.8 C)-98.5 F (36.9 C)] 98.2 F (36.8 C) (06/29 0540) Pulse Rate:  [58-75] 75 (06/29 0540) Resp:  [16-20] 20 (06/29 0540) BP: (142-159)/(70-95) 142/75 (06/29 1000) SpO2:  [96 %-100 %] 96 % (06/29 0540) Last BM Date : 11/29/21  Intake/Output from previous day: 06/28 0701 - 06/29 0700 In: 2027.3 [P.O.:640; I.V.:1140.8; IV Piggyback:246.4] Out: 3800 [Urine:3750; Drains:50] Intake/Output this shift: Total I/O In: 417.1 [I.V.:364.1; Other:10; IV Piggyback:43] Out: 3086 [Urine:1750; Drains:55]  PE: Gen:  Alert, NAD, pleasant Card:  Reg rate Pulm: Rate and effort normal, on o2 Abd: Soft, obese, improved L sided ttp without rigidity or guarding. Otherwise NT. Left-sided drain light brown purulent drainage today (30/24 hours). Right sided surgical drain is SS (20cc/24 hours)  Midline wound with wound vac in place - cdi with small amount of dark bloody output in cannister   Lab Results:  Recent Labs    12/01/21 1130 12/01/21 1326  WBC 8.1 9.1  HGB 7.0* 8.4*  HCT 22.5* 26.9*  PLT 165 198   BMET Recent Labs    12/01/21 0436 12/02/21 0341  NA 136 136  K 4.9 4.8  CL 107 107  CO2 23 23  GLUCOSE 119* 122*  BUN 25* 23  CREATININE 0.95 1.12  CALCIUM 8.7* 8.8*   PT/INR No results for input(s): "LABPROT", "INR" in the last 72 hours. CMP      Component Value Date/Time   NA 136 12/02/2021 0341   NA 133 (A) 06/24/2021 0000   K 4.8 12/02/2021 0341   CL 107 12/02/2021 0341   CO2 23 12/02/2021 0341   GLUCOSE 122 (H) 12/02/2021 0341   BUN 23 12/02/2021 0341   BUN 18 06/24/2021 0000   CREATININE 1.12 12/02/2021 0341   CALCIUM 8.8 (L) 12/02/2021 0341   PROT 7.1 12/02/2021 0341   ALBUMIN 1.8 (L) 12/02/2021 0341   AST 31 12/02/2021 0341   ALT 17 12/02/2021 0341   ALKPHOS 39 12/02/2021 0341   BILITOT 0.6 12/02/2021 0341   GFRNONAA >60 12/02/2021 0341   GFRAA >60 02/19/2018 1123   Lipase     Component Value Date/Time   LIPASE 40 11/07/2021 1029    Studies/Results: No results found.  Anti-infectives: Anti-infectives (From admission, onward)    Start     Dose/Rate Route Frequency Ordered Stop   11/30/21 2200  piperacillin-tazobactam (ZOSYN) IVPB 3.375 g        3.375 g 12.5 mL/hr over 240 Minutes Intravenous Every 8 hours 11/30/21 1435     11/29/21 0600  vancomycin (VANCOREADY) IVPB 1250 mg/250 mL  Status:  Discontinued        1,250 mg 166.7 mL/hr over 90 Minutes Intravenous Every 24 hours 11/28/21 0759 11/29/21 0727   11/28/21 0500  vancomycin (VANCOCIN) IVPB 1000 mg/200 mL premix  Status:  Discontinued        1,000 mg 200 mL/hr over 60 Minutes Intravenous Every 24 hours 11/27/21 0414 11/28/21 0759   11/27/21 0600  meropenem (MERREM) 1 g in sodium chloride 0.9 % 100 mL IVPB  Status:  Discontinued        1 g 200 mL/hr over 30 Minutes Intravenous Every 8 hours 11/27/21 0346 11/30/21 1435   11/27/21 0430  vancomycin (VANCOREADY) IVPB 2000 mg/400 mL        2,000 mg 200 mL/hr over 120 Minutes Intravenous  Once 11/27/21 0346 11/27/21 0655   11/24/21 1400  piperacillin-tazobactam (ZOSYN) IVPB 3.375 g  Status:  Discontinued        3.375 g 12.5 mL/hr over 240 Minutes Intravenous Every 8 hours 11/24/21 1018 11/27/21 0323   11/24/21 1200  Ampicillin-Sulbactam (UNASYN) 3 g in sodium chloride 0.9 % 100 mL IVPB  Status:   Discontinued        3 g 200 mL/hr over 30 Minutes Intravenous Every 6 hours 11/24/21 0900 11/24/21 1018   11/23/21 0830  piperacillin-tazobactam (ZOSYN) IVPB 3.375 g  Status:  Discontinued        3.375 g 12.5 mL/hr over 240 Minutes Intravenous Every 8 hours 11/23/21 0736 11/24/21 0858   11/22/21 1200  Ampicillin-Sulbactam (UNASYN) 3 g in sodium chloride 0.9 % 100 mL IVPB  Status:  Discontinued        3 g 200 mL/hr over 30 Minutes Intravenous Every 6 hours 11/22/21 0923 11/23/21 0736   11/20/21 1645  micafungin (MYCAMINE) 100 mg in sodium chloride 0.9 % 100 mL IVPB        100 mg 105 mL/hr over 1 Hours Intravenous Every 24 hours 11/20/21 1554     11/08/21 0730  vancomycin (VANCOREADY) IVPB 1750 mg/350 mL        1,750 mg 175 mL/hr over 120 Minutes Intravenous  Once 11/08/21 0641 11/08/21 0908   11/08/21 0641  vancomycin variable dose per unstable renal function (pharmacist dosing)  Status:  Discontinued         Does not apply See admin instructions 11/08/21 0641 11/08/21 1138   11/07/21 2000  piperacillin-tazobactam (ZOSYN) IVPB 3.375 g  Status:  Discontinued        3.375 g 12.5 mL/hr over 240 Minutes Intravenous Every 8 hours 11/07/21 1921 11/22/21 0923   11/07/21 1430  piperacillin-tazobactam (ZOSYN) IVPB 3.375 g        3.375 g 100 mL/hr over 30 Minutes Intravenous  Once 11/07/21 1418 11/07/21 1535        Assessment/Plan POD 9 s/p exploratory laparotomy and drainage of pelvic abscess by Dr. 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 The pelvic fluid collection that was noted on CT was able to be drained w/ 60F blake drain left behind in this area. IR drains were removed. There is question if his disease process was related to his small bowel rather than being diverticular in nature.  His small bowel was  fused and there was no option surgically except drainage. There does not appear to be a good surgical option at this point either.  - S/p IR drainage 6/24 of LLQ fluid collection. Monitor drain, light brown/purulent today - reviewed with MD - 1/2 TPN - Advance to soft diet. Cont shakes and supplements. Calorie Count. Cont Remeron for appetite stimulation.  -  Cont JP drain. SS - Cont abx. Prior IR cx w/ Candida tropicalis, E. Coli and Bacteroides. ID is following. They plan to d/c on IV abx  - VAC M/W/F. WOCN following. Will need home health vac if progresses with PT and is able to return home.  - PT/OT for mobilization - OT recommending no f/u. PT rec HH PT vs SNF depending on progression. Will see how he does today. I have messaged PT to let me know how he does. If progresses to [facility] level will write for [facility] PT/RN and home vac w/ TOC consult.  - Appreciate TRH and IR assistance with his care   FEN - Soft diet, shakes, Remeron, IVF per primary, 1/2 TPN. Calorie Count VTE - SCDs, subq heparin. Hgb pending this am. If stable can restart heparin gtt - will let TRH know ID - Zosyn and micafungin    ABL anemia - Pending this am A. Fib with RVR - resolved, lopressor.  AKI - resolved Acute respiratory failure with hypoxia - improved. 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

## 2021-12-02 NOTE — Progress Notes (Signed)
PHARMACY CONSULT NOTE FOR:  OUTPATIENT  PARENTERAL ANTIBIOTIC THERAPY (OPAT)  Indication: Intra-abdominal infection/abscess  Regimen: Ertapenem 1 gm every 24 hours + Caspofungin 70 mg IV every 24 hours  End date: 12/25/2021   IV antibiotic discharge orders are pended. To discharging provider:  please sign these orders via discharge navigator,  Select New Orders & click on the button choice - Manage This Unsigned Work.     Thank you for allowing pharmacy to be a part of this patient's care.  Jimmy Footman, PharmD, BCPS, BCIDP Infectious Diseases Clinical Pharmacist Phone: [phone] 12/02/2021, 11:21 AM

## 2021-12-03 DIAGNOSIS — K651 Peritoneal abscess: Secondary | ICD-10-CM | POA: Diagnosis not present

## 2021-12-03 LAB — BASIC METABOLIC PANEL
Anion gap: 7 (ref 5–15)
BUN: 28 mg/dL — ABNORMAL HIGH (ref 8–23)
CO2: 22 mmol/L (ref 22–32)
Calcium: 8.7 mg/dL — ABNORMAL LOW (ref 8.9–10.3)
Chloride: 106 mmol/L (ref 98–111)
Creatinine, Ser: 1.15 mg/dL (ref 0.61–1.24)
GFR, Estimated: >60 mL/min (ref >60)
Glucose, Bld: 115 mg/dL — ABNORMAL HIGH (ref 70–99)
Potassium: 4.5 mmol/L (ref 3.5–5.1)
Sodium: 135 mmol/L (ref 135–145)

## 2021-12-03 LAB — CBC
HCT: 27.7 % — ABNORMAL LOW (ref 39.0–52.0)
Hemoglobin: 8.6 g/dL — ABNORMAL LOW (ref 13.0–17.0)
MCH: 31.4 pg (ref 26.0–34.0)
MCHC: 31 g/dL (ref 30.0–36.0)
MCV: 101.1 fL — ABNORMAL HIGH (ref 80.0–100.0)
Platelets: 239 10*3/uL (ref 150–400)
RBC: 2.74 MIL/uL — ABNORMAL LOW (ref 4.22–5.81)
RDW: 16.2 % — ABNORMAL HIGH (ref 11.5–15.5)
WBC: 8.9 10*3/uL (ref 4.0–10.5)
nRBC: 0 % (ref 0.0–0.2)

## 2021-12-03 LAB — GLUCOSE, CAPILLARY
Glucose-Capillary: 113 mg/dL — ABNORMAL HIGH (ref 70–99)
Glucose-Capillary: 115 mg/dL — ABNORMAL HIGH (ref 70–99)
Glucose-Capillary: 117 mg/dL — ABNORMAL HIGH (ref 70–99)
Glucose-Capillary: 118 mg/dL — ABNORMAL HIGH (ref 70–99)
Glucose-Capillary: 122 mg/dL — ABNORMAL HIGH (ref 70–99)

## 2021-12-03 LAB — MAGNESIUM: Magnesium: 1.9 mg/dL (ref 1.7–2.4)

## 2021-12-03 LAB — PHOSPHORUS: Phosphorus: 3.9 mg/dL (ref 2.5–4.6)

## 2021-12-03 MED ORDER — ZINC SULFATE 220 (50 ZN) MG PO CAPS
220.0000 mg | ORAL_CAPSULE | Freq: Every day | ORAL | Status: DC
Start: 2021-12-03 — End: 2021-12-07
  Administered 2021-12-03 – 2021-12-07 (×5): 220 mg via ORAL
  Filled 2021-12-03 (×5): qty 1

## 2021-12-03 MED ORDER — TRAVASOL 10 % IV SOLN
INTRAVENOUS | Status: AC
  Filled 2021-12-03: qty 684

## 2021-12-03 MED ORDER — ASCORBIC ACID 500 MG PO TABS
500.0000 mg | ORAL_TABLET | Freq: Every day | ORAL | Status: DC
  Administered 2021-12-03 – 2021-12-07 (×5): 500 mg via ORAL
  Filled 2021-12-03 (×5): qty 1

## 2021-12-03 MED ORDER — PROSOURCE PLUS PO LIQD
30.0000 mL | Freq: Three times a day (TID) | ORAL | Status: DC
  Administered 2021-12-04 – 2021-12-07 (×10): 30 mL via ORAL
  Filled 2021-12-03 (×12): qty 30

## 2021-12-03 MED ORDER — CASPOFUNGIN IV (FOR PTA / DISCHARGE USE ONLY): 70.0000 mg | INTRAVENOUS | 0 refills | Status: AC

## 2021-12-03 MED ORDER — QUETIAPINE FUMARATE 25 MG PO TABS
25.0000 mg | ORAL_TABLET | ORAL | Status: DC
  Administered 2021-12-03 – 2021-12-06 (×4): 25 mg via ORAL
  Filled 2021-12-03 (×4): qty 1

## 2021-12-03 MED ORDER — ERTAPENEM IV (FOR PTA / DISCHARGE USE ONLY): 1.0000 g | INTRAVENOUS | 0 refills | Status: AC

## 2021-12-03 NOTE — Progress Notes (Signed)
Referring Physician(s): Jeanmarie Hubert  Supervising Physician: Aletta Edouard  Patient Status:  [name] - In-pt  Chief Complaint: Intra-abdominal abscess s/p LLQ drain placement by Dr Kathlene Cote 6/24   Subjective: Pt doing ok; no new c/o; anticipating dc home over the weekend   Allergies: Patient has no known allergies.  Medications: Prior to Admission medications   Medication Sig Start Date End Date Taking? Authorizing Provider  FARXIGA 10 MG TABS tablet Take 10 mg by mouth daily.   Yes [provider]  metoprolol succinate (TOPROL-XL) 25 MG 24 hr tablet Take 1 tablet (25 mg total) by mouth at bedtime. START with 1/2 pill for one week, then increase to 1 full pill. Patient taking differently: Take 25 mg by mouth at bedtime. 10/18/21  Yes Jeanie Sewer, NP  dapagliflozin propanediol (FARXIGA) 5 MG TABS tablet Take 1 tablet (5 mg total) by mouth daily. Patient not taking: Reported on 11/07/2021 10/14/21   Jeanie Sewer, NP  Dulaglutide (TRULICITY) 1.5 RX/5.4MG SOPN Inject 1.5 mg into the skin once a week. Start after you run out of the 0.'75mg'$  dose x 2 each week. Patient not taking: Reported on 11/07/2021 10/14/21   Jeanie Sewer, NP  metFORMIN (GLUCOPHAGE-XR) 500 MG 24 hr tablet TAKE 1 TABLET BY MOUTH EVERY DAY WITH BREAKFAST Patient not taking: Reported on 11/07/2021 10/14/21   Jeanie Sewer, NP  Omega-3 Fatty Acids (FISH OIL PO) Take 2 capsules by mouth daily. Patient not taking: Reported on 11/07/2021    [provider]  telmisartan-hydrochlorothiazide (MICARDIS HCT) 80-25 MG tablet Take 1 tablet by mouth daily. Patient not taking: Reported on 11/07/2021    [provider]  varenicline (CHANTIX) 0.5 MG tablet INCREASE TO 2 TABS IN AM, 1 TAB IN PM FOR 1-2 WEEKS, THEN 2 TABS AM AND 2 TABS PM. Patient not taking: Reported on 11/07/2021 10/08/21   Jeanie Sewer, NP     Vital Signs: BP 130/72   Pulse 94   Temp 97.9 F (36.6 C) (Oral)   Resp 20    Ht 5' 6.93" (1.7 m)   Wt 258 lb 2.5 oz (117.1 kg)   SpO2 99%   BMI 40.52 kg/m   Physical Exam: awake/alert; LLQ drain intact, insertion site ok, mildly tender, OP 75 cc turbid, light brown fluid yesterday, 10 cc so far today  Imaging: No results found.  Labs:  CBC: Recent Labs    12/01/21 1130 12/01/21 1326 12/02/21 1131 12/03/21 0340  WBC 8.1 9.1 9.9 8.9  HGB 7.0* 8.4* 9.1* 8.6*  HCT 22.5* 26.9* 29.0* 27.7*  PLT 165 198 264 239    COAGS: Recent Labs    11/08/21 0822 11/09/21 0616 11/12/21 1327  INR 1.6* 2.1* 1.3*    BMP: Recent Labs    11/30/21 0342 12/01/21 0436 12/02/21 0341 12/03/21 0340  NA 135 136 136 135  K 4.4 4.9 4.8 4.5  CL 107 107 107 106  CO2 '22 23 23 22  '$ GLUCOSE 128* 119* 122* 115*  BUN 26* 25* 23 28*  CALCIUM 8.4* 8.7* 8.8* 8.7*  CREATININE 1.07 0.95 1.12 1.15  GFRNONAA >60 >60 >60 >60    LIVER FUNCTION TESTS: Recent Labs    11/29/21 0535 11/30/21 0342 12/01/21 0436 12/02/21 0341  BILITOT 0.6 0.7 0.5 0.6  AST 34 35 29 31  ALT '18 19 18 17  '$ ALKPHOS 33* 38 39 39  PROT 6.4* 6.7 6.9 7.1  ALBUMIN 1.7* 1.8* 1.8* 1.8*    Assessment and Plan: 66 yo male  known to IR from previous image-guided drain placements which were intra-operatively removed during exploratory laparotomy on 6/20. Pt subsequently developed AMS, tachycardia, tachypnea, and leukocytosis. Patient was transferred to the ICU and Dr Kathlene Cote was consulted for a recurrent abscess. A LLQ drain was placed on 6/24; afebrile, WBC nl, hgb 8.6(9.1), creat nl; drain fl cx- few e coli, mod candida albicans, few bacteroides  Drain Location: LLQ Size: Fr size: 12 Fr Date of placement: 11/27/21  Currently to: Drain collection device: suction bulb 24 hour output:  Output by Drain (mL) 12/01/21 0701 - 12/01/21 1900 12/01/21 1901 - 12/02/21 0700 12/02/21 0701 - 12/02/21 1900 12/02/21 1901 - 12/03/21 0700 12/03/21 0701 - 12/03/21 1001  Closed System Drain 1 Right;Medial RLQ Bulb (JP) 19  Fr. '20  20 10 15  '$ Closed System Drain 1 Left;Anterior Abdomen Bulb (JP) 12 Fr. 30  35 40 10  Negative Pressure Wound Therapy Abdomen Medial   0  0      Current examination: Flushes/aspirates easily.  Insertion site unremarkable. Suture and stat lock in place. Dressed appropriately.   Plan: Continue TID flushes with 5 cc NS. Record output Q shift. Dressing changes QD or PRN if soiled.  Call IR APP or on call IR MD if difficulty flushing or sudden change in drain output.  Repeat imaging/possible drain injection once output < 10 mL/QD (excluding flush material). Consideration for drain removal if output is < 10 mL/QD (excluding flush material), pending discussion with the providing surgical service.  Discharge planning: IR team will call pt with date of f/u IR clinic appt in 10-14 days;  Patient will need to flush drain QD with 5 cc NS, record output QD, dressing changes every 2-3 days or earlier if soiled.   Pt given prescription for additional saline flushes; daughter aware of how to flush drain      Electronically Signed: D. Rowe Robert, PA-C 12/03/2021, 9:57 AM   I spent a total of 15 Minutes at the the patient's bedside AND on the patient's hospital floor or unit, greater than 50% of which was counseling/coordinating care for left lower abdominal abscess drain    Patient ID: Robert Larson, male   DOB: [birth year], 66 y.o.   MRN: [id number]

## 2021-12-03 NOTE — Progress Notes (Signed)
10 Days Post-Op  Subjective: CC: Patient reports stable left-sided abdominal pain.  Advanced to soft diet yesterday and thinks he is eating more but is not able to recall anything he ate/drink.  Does not think he had any shakes.  Calorie count on door shows that he had a 15% of his last meal.  Reports he is passing flatus.  He is not sure if he had any BMs yesterday; I/Os showed he had one. Ambulated better with PT. Voiding.   Objective: Vital signs in last 24 hours: Temp:  [97.5 F (36.4 C)-98.9 F (37.2 C)] 97.9 F (36.6 C) (06/30 0440) Pulse Rate:  [57-106] 94 (06/30 0948) Resp:  [20] 20 (06/30 0440) BP: (103-142)/(72-87) 130/72 (06/30 0948) SpO2:  [96 %-99 %] 99 % (06/30 0440) Last BM Date : 12/02/21  Intake/Output from previous day: 06/29 0701 - 06/30 0700 In: 2111.7 [P.O.:120; I.V.:1740.7; IV Piggyback:241] Out: 3716 [Urine:2400; Drains:105] I have messaged the RN to clarify what the 1900 mL of "other" is from yesterday Intake/Output this shift: Total I/O In: 99.2 [I.V.:83.4; IV Piggyback:15.8] Out: 925 [Urine:900; Drains:25]   PE: Gen:  Alert, NAD, pleasant Card:  Reg rate Pulm: Rate and effort normal, on o2 Abd: Soft, obese, improved L sided ttp without rigidity or guarding. Otherwise NT. Left-sided drain light brown purulent drainage today (75/24 hours). Right sided surgical drain is SS (30cc/24 hours)  Midline wound with healthy-appearing granulation tissue at the base and sides of the wound as noted in the picture below.  No evidence of dehiscence.  Wound VAC replaced     Lab Results:  Recent Labs    12/02/21 1131 12/03/21 0340  WBC 9.9 8.9  HGB 9.1* 8.6*  HCT 29.0* 27.7*  PLT 264 239   BMET Recent Labs    12/02/21 0341 12/03/21 0340  NA 136 135  K 4.8 4.5  CL 107 106  CO2 23 22  GLUCOSE 122* 115*  BUN 23 28*  CREATININE 1.12 1.15  CALCIUM 8.8* 8.7*   PT/INR No results for input(s): "LABPROT", "INR" in the last 72 hours. CMP      Component Value Date/Time   NA 135 12/03/2021 0340   NA 133 (A) 06/24/2021 0000   K 4.5 12/03/2021 0340   CL 106 12/03/2021 0340   CO2 22 12/03/2021 0340   GLUCOSE 115 (H) 12/03/2021 0340   BUN 28 (H) 12/03/2021 0340   BUN 18 06/24/2021 0000   CREATININE 1.15 12/03/2021 0340   CALCIUM 8.7 (L) 12/03/2021 0340   PROT 7.1 12/02/2021 0341   ALBUMIN 1.8 (L) 12/02/2021 0341   AST 31 12/02/2021 0341   ALT 17 12/02/2021 0341   ALKPHOS 39 12/02/2021 0341   BILITOT 0.6 12/02/2021 0341   GFRNONAA >60 12/03/2021 0340   GFRAA >60 02/19/2018 1123   Lipase     Component Value Date/Time   LIPASE 40 11/07/2021 1029    Studies/Results: No results found.  Anti-infectives: Anti-infectives (From admission, onward)    Start     Dose/Rate Route Frequency Ordered Stop   11/30/21 2200  piperacillin-tazobactam (ZOSYN) IVPB 3.375 g        3.375 g 12.5 mL/hr over 240 Minutes Intravenous Every 8 hours 11/30/21 1435     11/29/21 0600  vancomycin (VANCOREADY) IVPB 1250 mg/250 mL  Status:  Discontinued        1,250 mg 166.7 mL/hr over 90 Minutes Intravenous Every 24 hours 11/28/21 0759 11/29/21 0727   11/28/21 0500  vancomycin (VANCOCIN)  IVPB 1000 mg/200 mL premix  Status:  Discontinued        1,000 mg 200 mL/hr over 60 Minutes Intravenous Every 24 hours 11/27/21 0414 11/28/21 0759   11/27/21 0600  meropenem (MERREM) 1 g in sodium chloride 0.9 % 100 mL IVPB  Status:  Discontinued        1 g 200 mL/hr over 30 Minutes Intravenous Every 8 hours 11/27/21 0346 11/30/21 1435   11/27/21 0430  vancomycin (VANCOREADY) IVPB 2000 mg/400 mL        2,000 mg 200 mL/hr over 120 Minutes Intravenous  Once 11/27/21 0346 11/27/21 0655   11/24/21 1400  piperacillin-tazobactam (ZOSYN) IVPB 3.375 g  Status:  Discontinued        3.375 g 12.5 mL/hr over 240 Minutes Intravenous Every 8 hours 11/24/21 1018 11/27/21 0323   11/24/21 1200  Ampicillin-Sulbactam (UNASYN) 3 g in sodium chloride 0.9 % 100 mL IVPB  Status:   Discontinued        3 g 200 mL/hr over 30 Minutes Intravenous Every 6 hours 11/24/21 0900 11/24/21 1018   11/23/21 0830  piperacillin-tazobactam (ZOSYN) IVPB 3.375 g  Status:  Discontinued        3.375 g 12.5 mL/hr over 240 Minutes Intravenous Every 8 hours 11/23/21 0736 11/24/21 0858   11/22/21 1200  Ampicillin-Sulbactam (UNASYN) 3 g in sodium chloride 0.9 % 100 mL IVPB  Status:  Discontinued        3 g 200 mL/hr over 30 Minutes Intravenous Every 6 hours 11/22/21 0923 11/23/21 0736   11/20/21 1645  micafungin (MYCAMINE) 100 mg in sodium chloride 0.9 % 100 mL IVPB        100 mg 105 mL/hr over 1 Hours Intravenous Every 24 hours 11/20/21 1554     11/08/21 0730  vancomycin (VANCOREADY) IVPB 1750 mg/350 mL        1,750 mg 175 mL/hr over 120 Minutes Intravenous  Once 11/08/21 0641 11/08/21 0908   11/08/21 0641  vancomycin variable dose per unstable renal function (pharmacist dosing)  Status:  Discontinued         Does not apply See admin instructions 11/08/21 0641 11/08/21 1138   11/07/21 2000  piperacillin-tazobactam (ZOSYN) IVPB 3.375 g  Status:  Discontinued        3.375 g 12.5 mL/hr over 240 Minutes Intravenous Every 8 hours 11/07/21 1921 11/22/21 0923   11/07/21 1430  piperacillin-tazobactam (ZOSYN) IVPB 3.375 g        3.375 g 100 mL/hr over 30 Minutes Intravenous  Once 11/07/21 1418 11/07/21 1535        Assessment/Plan POD 10 s/p exploratory laparotomy and drainage of pelvic abscess by Dr. 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 The pelvic fluid collection that was noted on CT was able to be drained w/ 61F blake drain left behind in this area. IR drains were removed. There is question if his disease process was related to his small bowel rather than being diverticular in nature.  His small bowel was  fused and there was no option surgically except drainage. There does not appear to be a good surgical option at this point either.  - S/p IR drainage 6/24 of LLQ fluid collection. Monitor drain, stable light brown/purulent today  - Cont 1/2 TPN till taking in more. On soft diet + shakes/supplements and  undergoing calorie count. Cont Remeron for appetite stimulation.  - Cont JP drain. SS - Cont abx. Prior IR cx w/ Candida tropicalis, E. Coli and Bacteroides. ID is following. They plan to d/c on IV abx  - VAC M/W/F. WOCN following. - PT/OT for mobilization - OT recommending no f/u. PT rec HH PT  - TOC consult - HH PT/RN and home vac  - Appreciate TRH and IR assistance with his care. - Updated family at bedside today.  He plans to stay with his mom at discharge with his daughter and daughter-in-law available for assistance  I am a little concerned about patient's inability to remember events from yesterday/confusion although he is able to answer orientation questions appropriately.  His WBC is currently wnl and HDS without fever, tachycardia (HR 90's although on scheduled Toprol) or hypotension this morning. His abdominal exam is stable without peritonitis and drain characteristics are unchanged from yesterday.  Discussed with MD.  No indication for CT today but if patient was to worsen in any way, given history, would repeat CT A/P   FEN - Soft diet, shakes, Remeron, IVF per primary, 1/2 TPN. Calorie Count VTE - SCDs, subq heparin. Cardiology no longer recommending anticoagulation and plans for 30-day monitor at discharge ID - Zosyn and micafungin    ABL anemia - stable A. Fib with RVR - resolved, lopressor.  AKI - resolved Acute respiratory failure with hypoxia - improved. 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

## 2021-12-03 NOTE — TOC Progression Note (Addendum)
Transition of Care [facility]) - Progression Note    Patient Details  Name: MONTRE HARBOR MRN: [id number] Date of Birth: Jun 02, 1956  Transition of Care [facility]) CM/SW Contact  Leeroy Cha, RN Phone Number: 12/03/2021, 9:06 AM  Clinical Narrative:     Leandro Reasoner with [facility]. Will look patient up and call back. Unable to take Tct-[facility]-information-faxed with [id number] No response from [facility] at 1505. Tct [facility] to check status of the pt hhc request for physical therapy. Per coordination coordinator-have faxed request to several agencies. No agency has accepted at this time may be Monday July 3,2023 before provider can be found.  Will call this rn back. Expected Discharge Plan: [place] Barriers to Discharge: Continued Medical Work up  Expected Discharge Plan and Services Expected Discharge Plan: [place]                                               Social Determinants of Health (SDOH) Interventions    Readmission Risk Interventions     No data to display

## 2021-12-03 NOTE — Progress Notes (Signed)
PROGRESS NOTE    Robert Larson  JOA:[id number] DOB: [birth year] DOA: 11/07/2021 PCP: Jeanie Sewer, NP   Brief Narrative:  66 y/o male admitted on 6/4 in the setting of sepsis from multiple intra-abdominal abscesses to the hospitalists service.  He has been treated with antibiotics, intra-abdominal fluid drainage via percutaneous catheters, and supportive care.  In the setting of septic shock his course has been complicated by AKI, hypoxemia and atrial fibrillation with RVR.  He has had multiple drains placed, one from 6/18 grew candida tropicalis so ID was consulted.  On 6/20 he underwent an ex-lap and drainage of pelvic abscesses likely due to complicated diverticulitis by Dr. Donne Hazel.  Per surgery notes he was originally planned for at least a partial colectomy but noted intra-operatively to have a large portion of his abdomen frozen so bowel resection was not possible.  A 19 French drain was placed, IR drains removed.  Urology also participated in the procedure and performed bilateral retrograde pyelogram and bilateral open-ended ureteral catheter placement.   PCCM was consulted on 6/24 around 2 AM due to a change in mental status, worsening tachycardia and tachypnea.  Transferred to ICU, intubated.  Significant Hospital Events: Including procedures, antibiotic start and stop dates in addition to other pertinent events   6/4 admission, started zosyn 6/5 moved to ICU septic shock, IR drain placed in abdomen, grew e-coli and mixed anaerobic fluid 6/13 drain removed by IR  6/14 recurrent pelvic abscess on CT abdomen 6/15 2 new drains placed by IR in lower abdomen bilaterally, grew candida tropicalis 6/17 ID consult: micafungin 6/20 ex-lap, bilateral open ended ureteral catheter placement, drain placed in pelvic fluid collection 6/24 pccm consulted for confusion, fever tachycardia, remains on zosyn, intubated for airway protection, increased work of breathing with confusion.  Respiratory  alkalosis on ABG, lactic acid elevated at 3.7. daughter confirms full code status by phone. 6/25 extubated. A line removed. 6/26 vanc stopped 6/27, transferred under TRH. Assessment & Plan:   Principal Problem:   Intra-abdominal abscesses s/p perc drainage 11/08/2021 Active Problems:   Alteration in electrolyte and fluid balance   Essential hypertension   Type 2 diabetes mellitus with morbid obesity ([place])   Diverticulosis   Acute renal failure superimposed on stage 3a chronic kidney disease (HCC)   OSA (obstructive sleep apnea)   Osteoarthritis   Adrenal nodule (HCC)   History of adenomatous polyp of colon   Spinal stenosis of lumbar region   Renal cyst   Cholelithiasis   Tobacco abuse   Pressure injury of skin   GERD (gastroesophageal reflux disease)  Sepsis (abd) Multiple intra-abdominal abscesses: Initially managed with IR drain placement 6/5 and 6/15 with cultures growing E. coli, anaerobes, and Candida tropicalis.  Now status post exploratory laparotomy and drainage of pelvic abscess with general surgery 11/23/2021 (no new cultures) and found to have a good portion of his abdomen frozen at that time so unable to safely undergo resection of the sigmoid colon or diversion.  Repeat CT scan 6/24 given clinical instability showed a interval development of a large loculated fluid collection (5.3 x 9.9 x 21 cm) status post IR drain placement.  Gram stain with GNRs and cultures growing pansensitive E. coli.  Patient currently on Zosyn and micafungin, managed by ID.  Vancomycin stopped on 11/29/2021.  Pain is almost resolved.  He is tolerating soft diet.  Drains are managed by IR. He is improving.  TPN continues as well.  Per general surgery, patient improving and on the  path to likely discharge over the weekend.  ID has recommended ertapenem and caspofungin at the time of discharge with end date on 12/25/2021.  General surgery ha cleared the patient to receive full anticoagulation starting with  heparin drip however I reconsulted cardiology and since patient has not had any atrial fibrillation in a long time, plan is to hold full anticoagulation and do 30-day monitor.  Paroxysmal atrial fibrillation: During this hospitalization, due to acute sickness, sepsis, patient had developed atrial fibrillation with RVR around 11/11/2021.  On last note of cardiology, patient had recommended transitioning to oral anticoagulation from IV heparin once general surgery has cleared the patient.  In the interim, patient needed multiple procedures so full anticoagulation was held and he was only on heparin DVT prophylaxis dose. General surgery ha cleared the patient to receive full anticoagulation starting with heparin drip however I reconsulted cardiology and since patient has not had any atrial fibrillation in a long time, plan is to hold full anticoagulation and do 30-day monitor.  Acute respiratory failure with hypoxemia w/ concern for aspiration: Needed intubation on 11/27/2021 and successfully extubated 6/25.  Currently no complaints and saturating over 90% on room air.  Continue incentive spirometry.  Acute kidney injury: Patient does not appear to have CKD per my chart review but it is documented at several notes that he had CKD.  CKD ruled out.  He had AKI which is resolved.  Essential hypertension: PTA he was on telmisartan and hydrochlorothiazide which are on hold due to AKI.  Started on amlodipine which needed to be bumped up due to hypotension, continue Toprol-XL.  Blood pressure very well controlled now.  Type 2 diabetes mellitus: At home takes Trulicity and metformin which are on hold.  Blood sugar controlled.  Continue SSI.  GERD: Continue PPI.  DVT prophylaxis: heparin injection 5,000 Units Start: 11/27/21 0600 Place TED hose Start: 11/21/21 0950 SCDs Start: 11/07/21 1811   Code Status: Full Code  Family Communication: Daughter present at bedside.  Plan of care discussed with patient in length  and he/she verbalized understanding and agreed with it.  Status is: Inpatient Remains inpatient appropriate because: Advancing diet, will be discharged when cleared by general surgery.   Estimated body mass index is 40.52 kg/m as calculated from the following:   Height as of this encounter: 5' 6.93" (1.7 m).   Weight as of this encounter: 117.1 kg.  Pressure Injury 11/27/21 Ischial tuberosity Left Stage 3 -  Full thickness tissue loss. Subcutaneous fat may be visible but bone, tendon or muscle are NOT exposed. S3 2x3 (Active)  11/27/21 0800  Location: Ischial tuberosity  Location Orientation: Left  Staging: Stage 3 -  Full thickness tissue loss. Subcutaneous fat may be visible but bone, tendon or muscle are NOT exposed.  Wound Description (Comments): S3 2x3  Present on Admission: Yes  Dressing Type Foam - Lift dressing to assess site every shift 11/29/21 2002     Pressure Injury 11/27/21 Ischial tuberosity Right Stage 3 -  Full thickness tissue loss. Subcutaneous fat may be visible but bone, tendon or muscle are NOT exposed. S3 1x1 (Active)  11/27/21 0800  Location: Ischial tuberosity  Location Orientation: Right  Staging: Stage 3 -  Full thickness tissue loss. Subcutaneous fat may be visible but bone, tendon or muscle are NOT exposed.  Wound Description (Comments): S3 1x1  Present on Admission: Yes  Dressing Type Foam - Lift dressing to assess site every shift 11/29/21 2002     Pressure  Injury 11/27/21 Buttocks Right Stage 3 -  Full thickness tissue loss. Subcutaneous fat may be visible but bone, tendon or muscle are NOT exposed. 1x1 (Active)  11/27/21 0800  Location: Buttocks  Location Orientation: Right  Staging: Stage 3 -  Full thickness tissue loss. Subcutaneous fat may be visible but bone, tendon or muscle are NOT exposed.  Wound Description (Comments): 1x1  Present on Admission: Yes  Dressing Type Foam - Lift dressing to assess site every shift 11/29/21 2002     Pressure  Injury 11/27/21 Sacrum Right;Left Deep Tissue Pressure Injury - Purple or maroon localized area of discolored intact skin or blood-filled blister due to damage of underlying soft tissue from pressure and/or shear. L 9x6   R 11x7 (Active)  11/27/21 0800  Location: Sacrum  Location Orientation: Right;Left  Staging: Deep Tissue Pressure Injury - Purple or maroon localized area of discolored intact skin or blood-filled blister due to damage of underlying soft tissue from pressure and/or shear.  Wound Description (Comments): L 9x6   R 11x7  Present on Admission: Yes  Dressing Type Foam - Lift dressing to assess site every shift 11/29/21 0740   Nutritional Assessment: Body mass index is 40.52 kg/m.Marland Kitchen Seen by dietician.  I agree with the assessment and plan as outlined below: Nutrition Status: Nutrition Problem: Increased nutrient needs Etiology: acute illness, post-op healing, wound healing Signs/Symptoms: estimated needs Interventions: Boost Breeze, Premier Protein, Juven, Education, TPN  . Skin Assessment: I have examined the patient's skin and I agree with the wound assessment as performed by the wound care RN as outlined below: Pressure Injury 11/27/21 Ischial tuberosity Left Stage 3 -  Full thickness tissue loss. Subcutaneous fat may be visible but bone, tendon or muscle are NOT exposed. S3 2x3 (Active)  11/27/21 0800  Location: Ischial tuberosity  Location Orientation: Left  Staging: Stage 3 -  Full thickness tissue loss. Subcutaneous fat may be visible but bone, tendon or muscle are NOT exposed.  Wound Description (Comments): S3 2x3  Present on Admission: Yes  Dressing Type Foam - Lift dressing to assess site every shift 11/29/21 2002     Pressure Injury 11/27/21 Ischial tuberosity Right Stage 3 -  Full thickness tissue loss. Subcutaneous fat may be visible but bone, tendon or muscle are NOT exposed. S3 1x1 (Active)  11/27/21 0800  Location: Ischial tuberosity  Location Orientation:  Right  Staging: Stage 3 -  Full thickness tissue loss. Subcutaneous fat may be visible but bone, tendon or muscle are NOT exposed.  Wound Description (Comments): S3 1x1  Present on Admission: Yes  Dressing Type Foam - Lift dressing to assess site every shift 11/29/21 2002     Pressure Injury 11/27/21 Buttocks Right Stage 3 -  Full thickness tissue loss. Subcutaneous fat may be visible but bone, tendon or muscle are NOT exposed. 1x1 (Active)  11/27/21 0800  Location: Buttocks  Location Orientation: Right  Staging: Stage 3 -  Full thickness tissue loss. Subcutaneous fat may be visible but bone, tendon or muscle are NOT exposed.  Wound Description (Comments): 1x1  Present on Admission: Yes  Dressing Type Foam - Lift dressing to assess site every shift 11/29/21 2002     Pressure Injury 11/27/21 Sacrum Right;Left Deep Tissue Pressure Injury - Purple or maroon localized area of discolored intact skin or blood-filled blister due to damage of underlying soft tissue from pressure and/or shear. L 9x6   R 11x7 (Active)  11/27/21 0800  Location: Sacrum  Location Orientation: Right;Left  Staging: Deep Tissue Pressure Injury - Purple or maroon localized area of discolored intact skin or blood-filled blister due to damage of underlying soft tissue from pressure and/or shear.  Wound Description (Comments): L 9x6   R 11x7  Present on Admission: Yes  Dressing Type Foam - Lift dressing to assess site every shift 11/29/21 0740    Consultants:  General surgery IR ID PCCM-signed off Procedures:  As above  Antimicrobials:  Anti-infectives (From admission, onward)    Start     Dose/Rate Route Frequency Ordered Stop   11/30/21 2200  piperacillin-tazobactam (ZOSYN) IVPB 3.375 g        3.375 g 12.5 mL/hr over 240 Minutes Intravenous Every 8 hours 11/30/21 1435     11/29/21 0600  vancomycin (VANCOREADY) IVPB 1250 mg/250 mL  Status:  Discontinued        1,250 mg 166.7 mL/hr over 90 Minutes Intravenous  Every 24 hours 11/28/21 0759 11/29/21 0727   11/28/21 0500  vancomycin (VANCOCIN) IVPB 1000 mg/200 mL premix  Status:  Discontinued        1,000 mg 200 mL/hr over 60 Minutes Intravenous Every 24 hours 11/27/21 0414 11/28/21 0759   11/27/21 0600  meropenem (MERREM) 1 g in sodium chloride 0.9 % 100 mL IVPB  Status:  Discontinued        1 g 200 mL/hr over 30 Minutes Intravenous Every 8 hours 11/27/21 0346 11/30/21 1435   11/27/21 0430  vancomycin (VANCOREADY) IVPB 2000 mg/400 mL        2,000 mg 200 mL/hr over 120 Minutes Intravenous  Once 11/27/21 0346 11/27/21 0655   11/24/21 1400  piperacillin-tazobactam (ZOSYN) IVPB 3.375 g  Status:  Discontinued        3.375 g 12.5 mL/hr over 240 Minutes Intravenous Every 8 hours 11/24/21 1018 11/27/21 0323   11/24/21 1200  Ampicillin-Sulbactam (UNASYN) 3 g in sodium chloride 0.9 % 100 mL IVPB  Status:  Discontinued        3 g 200 mL/hr over 30 Minutes Intravenous Every 6 hours 11/24/21 0900 11/24/21 1018   11/23/21 0830  piperacillin-tazobactam (ZOSYN) IVPB 3.375 g  Status:  Discontinued        3.375 g 12.5 mL/hr over 240 Minutes Intravenous Every 8 hours 11/23/21 0736 11/24/21 0858   11/22/21 1200  Ampicillin-Sulbactam (UNASYN) 3 g in sodium chloride 0.9 % 100 mL IVPB  Status:  Discontinued        3 g 200 mL/hr over 30 Minutes Intravenous Every 6 hours 11/22/21 0923 11/23/21 0736   11/20/21 1645  micafungin (MYCAMINE) 100 mg in sodium chloride 0.9 % 100 mL IVPB        100 mg 105 mL/hr over 1 Hours Intravenous Every 24 hours 11/20/21 1554     11/08/21 0730  vancomycin (VANCOREADY) IVPB 1750 mg/350 mL        1,750 mg 175 mL/hr over 120 Minutes Intravenous  Once 11/08/21 0641 11/08/21 0908   11/08/21 0641  vancomycin variable dose per unstable renal function (pharmacist dosing)  Status:  Discontinued         Does not apply See admin instructions 11/08/21 0641 11/08/21 1138   11/07/21 2000  piperacillin-tazobactam (ZOSYN) IVPB 3.375 g  Status:   Discontinued        3.375 g 12.5 mL/hr over 240 Minutes Intravenous Every 8 hours 11/07/21 1921 11/22/21 0923   11/07/21 1430  piperacillin-tazobactam (ZOSYN) IVPB 3.375 g        3.375 g 100 mL/hr over  30 Minutes Intravenous  Once 11/07/21 1418 11/07/21 1535         Subjective:  Patient seen and examined.  Daughter at the bedside.  Per reports from general surgery, patient was confused this morning.  In the past during this hospitalization when he was confused, he deteriorated over the course of the next 1 to 2 days so surgery is concerned.  However during my evaluation, patient was fully alert and oriented and had no complaints.  Daughter just arrived and she also did not notice any confusion.  Objective: Vitals:   12/02/21 1821 12/02/21 2050 12/03/21 0440 12/03/21 0948  BP: 115/72 138/81 103/87 130/72  Pulse: 93 96 (!) 57 94  Resp:  20 20   Temp: 98.4 F (36.9 C) (!) 97.5 F (36.4 C) 97.9 F (36.6 C)   TempSrc: Oral Oral Oral   SpO2: 96% 97% 99%   Weight:      Height:        Intake/Output Summary (Last 24 hours) at 12/03/2021 1149 Last data filed at 12/03/2021 1000 Gross per 24 hour  Intake 1823.05 ml  Output 3525 ml  Net -1701.95 ml    Filed Weights   11/21/21 0000 11/23/21 1041 11/27/21 0000  Weight: 121.5 kg 121.5 kg 117.1 kg    Examination:  General exam: Appears calm and comfortable, obese Respiratory system: Clear to auscultation. Respiratory effort normal. Cardiovascular system: S1 & S2 heard, RRR. No JVD, murmurs, rubs, gallops or clicks. No pedal edema. Gastrointestinal system: Abdomen is nondistended, soft and mild generalized tenderness. No organomegaly or masses felt. Normal bowel sounds heard. Central nervous system: Alert and oriented. No focal neurological deficits. Extremities: Symmetric 5 x 5 power. Skin: No rashes, lesions or ulcers.  Psychiatry: Judgement and insight appear normal. Mood & affect appropriate.    Data Reviewed: I have  personally reviewed following labs and imaging studies  CBC: Recent Labs  Lab 11/29/21 1503 11/30/21 0342 12/01/21 1130 12/01/21 1326 12/02/21 1131 12/03/21 0340  WBC 8.7 9.5 8.1 9.1 9.9 8.9  NEUTROABS 6.3  --   --   --   --   --   HGB 7.9* 8.3* 7.0* 8.4* 9.1* 8.6*  HCT 25.2* 26.3* 22.5* 26.9* 29.0* 27.7*  MCV 100.8* 100.4* 102.7* 102.3* 102.1* 101.1*  PLT 146* 179 165 198 264 387    Basic Metabolic Panel: Recent Labs  Lab 11/27/21 0209 11/27/21 1030 11/28/21 0500 11/29/21 0535 11/30/21 0342 12/01/21 0436 12/02/21 0341 12/03/21 0340  NA 133*   < > 136 135 135 136 136 135  K 4.8   < > 4.1 4.2 4.4 4.9 4.8 4.5  CL 106   < > 106 104 107 107 107 106  CO2 17*   < > '24 24 22 23 23 22  '$ GLUCOSE 172*   < > 144* 126* 128* 119* 122* 115*  BUN 32*   < > 38* 31* 26* 25* 23 28*  CREATININE 1.81*   < > 1.39* 1.13 1.07 0.95 1.12 1.15  CALCIUM 8.4*   < > 8.3* 8.0* 8.4* 8.7* 8.8* 8.7*  MG 1.7  --  2.1 2.0  --   --  1.9 1.9  PHOS 1.9*  --  2.7 2.4* 2.7 3.3 3.6 3.9   < > = values in this interval not displayed.    GFR: Estimated Creatinine Clearance: 79.3 mL/min (by C-G formula based on SCr of 1.15 mg/dL). Liver Function Tests: Recent Labs  Lab 11/28/21 0500 11/29/21 0535 11/30/21 0342 12/01/21  5397 12/02/21 0341  AST 26 34 35 29 31  ALT '14 18 19 18 17  '$ ALKPHOS 33* 33* 38 39 39  BILITOT 0.6 0.6 0.7 0.5 0.6  PROT 6.0* 6.4* 6.7 6.9 7.1  ALBUMIN 1.6* 1.7* 1.8* 1.8* 1.8*    No results for input(s): "LIPASE", "AMYLASE" in the last 168 hours. No results for input(s): "AMMONIA" in the last 168 hours. Coagulation Profile: No results for input(s): "INR", "PROTIME" in the last 168 hours. Cardiac Enzymes: No results for input(s): "CKTOTAL", "CKMB", "CKMBINDEX", "TROPONINI" in the last 168 hours. BNP (last 3 results) No results for input(s): "PROBNP" in the last 8760 hours. HbA1C: No results for input(s): "HGBA1C" in the last 72 hours. CBG: Recent Labs  Lab 12/02/21 0536  12/02/21 1157 12/02/21 1753 12/03/21 0018 12/03/21 0628  GLUCAP 127* 131* 111* 115* 118*    Lipid Profile: Recent Labs    12/02/21 0341  TRIG 117    Thyroid Function Tests: No results for input(s): "TSH", "T4TOTAL", "FREET4", "T3FREE", "THYROIDAB" in the last 72 hours. Anemia Panel: No results for input(s): "VITAMINB12", "FOLATE", "FERRITIN", "TIBC", "IRON", "RETICCTPCT" in the last 72 hours. Sepsis Labs: Recent Labs  Lab 11/27/21 0209 11/27/21 0456  LATICACIDVEN 3.6* 3.0*     Recent Results (from the past 240 hour(s))  Culture, blood (Routine X 2) w Reflex to ID Panel     Status: None   Collection Time: 11/27/21  2:09 AM   Specimen: BLOOD  Result Value Ref Range Status   Specimen Description   Final    BLOOD LEFT ANTECUBITAL Performed at [facility] [address], [place], [place] [address]    Special Requests   Final    BOTTLES DRAWN AEROBIC AND ANAEROBIC Blood Culture adequate volume Performed at [facility] [address] [place], [place] [address]    Culture   Final    NO GROWTH 5 DAYS Performed at [facility], [place] [address] [place], [place] [address]    Report Status 12/02/2021 FINAL  Final  Culture, blood (Routine X 2) w Reflex to ID Panel     Status: None   Collection Time: 11/27/21  2:11 AM   Specimen: BLOOD  Result Value Ref Range Status   Specimen Description   Final    BLOOD BLOOD LEFT HAND Performed at [facility] [address], [place], [place] [address]    Special Requests   Final    BOTTLES DRAWN AEROBIC AND ANAEROBIC Blood Culture adequate volume Performed at [facility] [address], [place], [place] [address]    Culture   Final    NO GROWTH 5 DAYS Performed at [facility], [place] [address], [place], [place] [address]    Report Status 12/02/2021 FINAL  Final  Aerobic/Anaerobic Culture w Gram Stain (surgical/deep wound)     Status: None    Collection Time: 11/27/21  1:55 PM   Specimen: Abscess  Result Value Ref Range Status   Specimen Description   Final    ABSCESS Performed at [facility] [address] [place], [place] [address]    Special Requests   Final    Normal Performed at [facility], [place] [address] [place], [place] [address]    Gram Stain   Final    ABUNDANT WBC PRESENT, PREDOMINANTLY PMN RARE GRAM NEGATIVE RODS    Culture   Final    FEW ESCHERICHIA COLI MODERATE CANDIDA  ALBICANS FEW BACTEROIDES THETAIOTAOMICRON BETA LACTAMASE POSITIVE Performed at [facility], [place] [address], [place], [place] [address]    Report Status 12/01/2021 FINAL  Final   Organism ID, Bacteria ESCHERICHIA COLI  Final      Susceptibility   Escherichia coli - MIC*    AMPICILLIN 4 SENSITIVE Sensitive     CEFAZOLIN <=4 SENSITIVE Sensitive     CEFEPIME <=0.12 SENSITIVE Sensitive     CEFTAZIDIME <=1 SENSITIVE Sensitive     CEFTRIAXONE <=0.25 SENSITIVE Sensitive     CIPROFLOXACIN <=0.25 SENSITIVE Sensitive     GENTAMICIN <=1 SENSITIVE Sensitive     IMIPENEM <=0.25 SENSITIVE Sensitive     TRIMETH/SULFA <=20 SENSITIVE Sensitive     AMPICILLIN/SULBACTAM <=2 SENSITIVE Sensitive     PIP/TAZO <=4 SENSITIVE Sensitive     * FEW ESCHERICHIA COLI     Radiology Studies: No results found.  Scheduled Meds:  amLODipine  10 mg Oral Daily   Chlorhexidine Gluconate Cloth  6 each Topical Daily   docusate sodium  100 mg Oral BID   feeding supplement  1 Container Oral Q24H   heparin injection (subcutaneous)  5,000 Units Subcutaneous Q8H   insulin aspart  0-15 Units Subcutaneous Q6H   lip balm  1 application  Topical BID   metoprolol succinate  25 mg Oral Daily   mirtazapine  7.5 mg Oral QHS   nutrition supplement (JUVEN)  1 packet Oral BID BM   pantoprazole  40 mg Oral Daily   polyethylene glycol  17 g Oral Daily   Ensure Max Protein  11 oz Oral Daily   sodium chloride flush  10-40 mL  Intracatheter Q12H   sodium chloride flush  5 mL Intracatheter Q8H   Continuous Infusions:  sodium chloride 250 mL (11/23/21 1629)   sodium chloride Stopped (11/09/21 1144)   sodium chloride 10 mL/hr at 11/29/21 1452   sodium chloride     micafungin (MYCAMINE) 100 mg in sodium chloride 0.9 % 100 mL IVPB 100 mg (12/02/21 2006)   ondansetron (ZOFRAN) IV     piperacillin-tazobactam (ZOSYN)  IV 3.375 g (12/03/21 0623)   TPN ADULT (ION) 50 mL/hr at 12/02/21 1834   TPN ADULT (ION)       LOS: 26 days   Darliss Cheney, MD [facility]  12/03/2021, 11:49 AM   *Please note that this is a verbal dictation therefore any spelling or grammatical errors are due to the "[place] [facility]" system interpretation.  Please page via [place] and do not message via secure chat for urgent patient care matters. Secure chat can be used for non urgent patient care matters.  How to contact the [facility] Attending or Consulting provider [place] or covering provider during after hours [place], for this patient?  Check the care team in [facility] and look for a) attending/consulting TR[facility] provider listed and b) the [facility] team listed. Page or secure chat [phone]. Log into [email or web address] and use [place]'s universal password to access. If you do not have the password, please contact the hospital operator. Locate the [facility] provider you are looking for under [facility] and page to a number that you can be directly reached. If you still have difficulty reaching the provider, please page the [facility] (Director on Call) for the Hospitalists listed on amion for assistance.

## 2021-12-03 NOTE — Progress Notes (Signed)
Physical Therapy Treatment Patient Details Name: Robert Larson MRN: [id number] DOB: Oct 21, 1955 Today's Date: 12/03/2021   History of Present Illness 66 year old with history of DM2, HTN, CKD admitted to the hospital for severe sepsis due to intra-abdominal abscess.  CT abdomen pelvis showed multiple loculated fluid collection in the lower abdomen with a large cavity in the pelvis measuring 11.5 X6X 11.7 cm.  Due to septic shock he was transferred to the ICU on 6/5.  Patient was seen by IR and drain was placed.  Hospital course was also complicated by acute kidney injury, hypoxia and atrial fibrillation with RVR.  Repeat CT showed resolution of abscess or the drain is in place but does have other pockets of fluid collection.  Patient is also on Cardizem drip, slowly uptitrating beta-blocker and heparin drip.   Repeat CT showed persistent abscess patient had drain placed 11/18/2021 by interventional radiology. 6/20 s/p Cystoscopy with bilateral retrograde pyelogram and bilateral open-ended ureteral catheter placement, exploratory laparotomy, drainage of pelvic abscess, wound vac. Pt transferred to ICU on 6/24 due to sepsis.    PT Comments    Patient making steady progress, slightly more fatigued today however and ambulated 3 shorter bouts. At start of session pt transferred to [facility] but did not have success with BM today, passed gas only. EOS pt discussing feeling down about his slow progress and still remaining in hospital. Encouraged pt on gradual progress being the norm. Will continue to progress pt as able, continue to recommend HHPT as pt has excellent support system.     Recommendations for follow up therapy are one component of a multi-disciplinary discharge planning process, led by the attending physician.  Recommendations may be updated based on patient status, additional functional criteria and insurance authorization.  Follow Up Recommendations  Home health PT     Assistance Recommended  at Discharge Frequent or constant Supervision/Assistance  Patient can return home with the following A lot of help with walking and/or transfers;A lot of help with bathing/dressing/bathroom;Assistance with cooking/housework;Direct supervision/assist for medications management;Direct supervision/assist for financial management;Assist for transportation;Help with stairs or ramp for entrance   Equipment Recommendations  None recommended by PT    Recommendations for Other Services       Precautions / Restrictions Precautions Precautions: Fall Precaution Comments: Rt/Lt JP drain, VAC, PICC Restrictions Weight Bearing Restrictions: No     Mobility  Bed Mobility Overal bed mobility: Needs Assistance Bed Mobility: Supine to Sit     Supine to sit: Min assist, HOB elevated     General bed mobility comments: pt using bed rail, taking extra time, light min assist to fully scoot to EOB.    Transfers Overall transfer level: Needs assistance Equipment used: Rolling walker (2 wheels) Transfers: Sit to/from Stand Sit to Stand: Mod assist, +2 safety/equipment           General transfer comment: sit<>stand from EOB, BSC, and recliner. Mod assist to power up with pt alternating hand position from bil UE on RW to single UE press up from bed or recliner.    Ambulation/Gait Ambulation/Gait assistance: Min assist, +2 safety/equipment Gait Distance (Feet): 36 Feet [phone]) Assistive device: Rolling walker (2 wheels) Gait Pattern/deviations: Step-through pattern, Decreased stride length, Trunk flexed, Narrow base of support, Antalgic Gait velocity: decr     General Gait Details: pt amb 3 short bouts, in room to [facility], from [facility] to hall, and short distance in hall. VC's for safe walker managment to maintain closer proximity. pt shaking in bil UE  and slightly in LE's on last bout due to fatigue. HR stable in 80's with gait. chair follow for safety.   Stairs             Wheelchair  Mobility    Modified Rankin (Stroke Patients Only)       Balance Overall balance assessment: Needs assistance Sitting-balance support: Single extremity supported Sitting balance-Leahy Scale: Fair Sitting balance - Comments: Pt able to maintain balance in sitting as long as he's holding onto bedrail, therapist hand,etc.   Standing balance support: Bilateral upper extremity supported, During functional activity, Reliant on assistive device for balance Standing balance-Leahy Scale: Fair Standing balance comment: Pt is limited by abdominal pain occuring d/t stretching abdomen for standing. Relies on RW for external support.                            Cognition Arousal/Alertness: Awake/alert Behavior During Therapy: WFL for tasks assessed/performed Overall Cognitive Status: Within Functional Limits for tasks assessed                                 General Comments: Pt alert and focused on task.        Exercises      General Comments        Pertinent Vitals/Pain Pain Assessment Pain Assessment: Faces Faces Pain Scale: Hurts even more Pain Location: abdomen Pain Descriptors / Indicators: Guarding, Sore, Tender, Tiring Pain Intervention(s): Limited activity within patient's tolerance, Monitored during session, Repositioned    Home Living                          Prior Function            PT Goals (current goals can now be found in the care plan section) Acute Rehab PT Goals Patient Stated Goal: To go home. PT Goal Formulation: With patient/family Time For Goal Achievement: 12/13/21 Potential to Achieve Goals: Good Progress towards PT goals: Progressing toward goals    Frequency    Min 3X/week      PT Plan Current plan remains appropriate    Co-evaluation              AM-PAC PT "6 Clicks" Mobility   Outcome Measure  Help needed turning from your back to your side while in a flat bed without using bedrails?: A  Lot Help needed moving from lying on your back to sitting on the side of a flat bed without using bedrails?: A Lot Help needed moving to and from a bed to a chair (including a wheelchair)?: A Little Help needed standing up from a chair using your arms (e.g., wheelchair or bedside chair)?: A Little Help needed to walk in hospital room?: A Little Help needed climbing 3-5 steps with a railing? : A Lot 6 Click Score: 15    End of Session Equipment Utilized During Treatment: Gait belt Activity Tolerance: Patient tolerated treatment well Patient left: in bed;with call bell/phone within reach;with family/visitor present (Daughter) Nurse Communication: Mobility status PT Visit Diagnosis: Unsteadiness on feet (R26.81);Other abnormalities of gait and mobility (R26.89);Muscle weakness (generalized) (M62.81);Pain Pain - part of body:  (Abdomen)     Time: 1700-1749 PT Time Calculation (min) (ACUTE ONLY): 33 min  Charges:  $Gait Training: 8-22 mins $Therapeutic Activity: 8-22 mins  Verner Mould, DPT Acute Rehabilitation Services Office [phone] Pager [phone]  12/03/21 4:04 PM

## 2021-12-03 NOTE — Progress Notes (Signed)
Calorie Count Note- Day 1  48 hour calorie count ordered.  Diet: Soft diet Supplements:  Boost Breeze po once daily, each supplement provides 250 kcal and 9 grams of protein Ensure Max po once daily, each supplement provides 150 kcal and 30 grams of protein.   Lunch: 17 kcal and 1g of protein Dinner: 195 kcal and 2g protein Breakfast: 10 kcal and 1g protein Supplements: 125 kcal and 5g protein  Total intake: 347 kcal (15% of minimum estimated needs)  9 protein (8% of minimum estimated needs)  Nutrition Dx: Increased nutrient needs related to acute illness, post-op healing, wound healing as evidenced by estimated needs.  Goal: Patient will meet greater than or equal to 90% of their needs  Intervention:  Continue Boost Breeze po once daily, each supplement provides 250 kcal and 9 grams of protein Discontinue Ensure Max and Juven as pt is not consuming these Double protein portions with each meal 30 ml ProSource Plus TID, each supplement provides 100 kcals and 15 grams protein.  Vitamin C 500 mg daily Zinc 220 mg daily x14 days  Estimated Nutritional Needs:  Kcal:  2300-2600 kcal Protein:  115-130 grams Fluid:  >/= 2.4 L/day  Clayborne Dana, RDN, LDN Clinical Nutrition

## 2021-12-03 NOTE — Progress Notes (Signed)
PHARMACY - TOTAL PARENTERAL NUTRITION CONSULT NOTE   Indication: Prolonged ileus  Patient Measurements: Height: 5' 6.93" (170 cm) Weight: 117.1 kg (258 lb 2.5 oz) IBW/kg (Calculated) : 65.94 TPN AdjBW (KG): 79.8 Body mass index is 40.52 kg/m. Usual Weight:   Assessment:  66 yo male admitted for sepsis d/t multiple diverticular IAAs. Failed conservative measures and underwent ex lap 6/20. Unable to perform colectomy or diversion d/t extensively frozen bowel. LLQ drain placed by IR on 6/24. Pharmacy consulted to dose TPN with prolonged ileus.  Glucose / Insulin: Hx DM on PTA metformin, SGLT2, GLP1 - CBGs goal <180. Range: 111 - 131 - 2 units SSI in 24 hrs, on mSSI q6h Electrolytes: CorrCa (10.5) remains slightly elevated with Ca removed from TPN. All other lytes WNL Renal: SCr stable. BUN slightly elevated Hepatic: albumin low; LFTs, bili, TG all WNL  - new cirrhosis Dx on imaging this admission Intake / Output; MIVF:  - drain placed by IR 6/24, total output: 105 mL (up slightly) - 24 hr UOP: 2400 mL + 1 unmeasured - PO intake: minimal GI Surgeries / Procedures:  - 6/20 Pt post-op exploratory laparotomy and drainage of pelvic abscess. -6/24: CT guided drain placement for LLQ abscess by IR  Central access: 11/08/2021 TPN start date: 11/24/2021  Nutritional Goals: Goal TPN rate is 95 mL/hr (provides 130 g protein and 2366 kcals per day)  RD Assessment: Estimated Needs Total Energy Estimated Needs: 2300-2600 kcal Total Protein Estimated Needs: 115-130 grams Total Fluid Estimated Needs: >/= 2.4 L/day  Current Nutrition:  Soft diet, TPN at half-rate -Ensure Max daily, Juven BID, Boost Breeze daily   Diet: CLD on 6/26 > FLD on 6/27 > soft diet 6/29 Mirtazapine started 6/28 for appetite stimulation  Plan:  Inquired about weaning off of TPN today, CCS would like to continue due to minimal PO intake.  At 1800: Continue TPN at half-rate of 50 mL/hr This provides 68 g protein,  1245 kcal Electrolytes in TPN: Reduce Phos Na 100 mEq/L K 20 mEq/L Ca 0 mEq/L  Mg 5 mEq/L Phos 10 mmol/L Cl:Ac ratio 1:1 Add standard MVI and trace elements to TPN Continue mSSI q6h Monitor TPN labs on Mon/Thurs. Recheck lytes with AM labs tomorrow. Monitor for ability to wean off of [place], PharmD, BCPS Clinical Pharmacist 12/03/2021 8:50 AM

## 2021-12-03 NOTE — Consult Note (Signed)
[facility] Nurse Consult Note: Reason for Consult: Vac dressing changed. Surgical PA at the bedside to assess wound appearance. Pt was medicated for pain prior to the procedure and tolerated with minimal amt discomfort.  Wound type: Abd with full thickness post-op wound; refer to previous notes for measurements. Beefy red with blue sutures visible in the center, scant amt pink drainage.  Periwound: intact  Dressing procedure/placement/frequency: Applied one piece black foam to 151m cont suction.  Barrier ring to lower edges to attempt to maintain a seal. WOC team will plan for dressing change on Mon if patient is still in the hospital at that time; eventually plans to discharge with home health for Vac changes. DJulien GirtMSN, RN, CN[place]

## 2021-12-04 DIAGNOSIS — D62 Acute posthemorrhagic anemia: Secondary | ICD-10-CM

## 2021-12-04 DIAGNOSIS — G9341 Metabolic encephalopathy: Secondary | ICD-10-CM

## 2021-12-04 DIAGNOSIS — I480 Paroxysmal atrial fibrillation: Secondary | ICD-10-CM

## 2021-12-04 DIAGNOSIS — E66813 Obesity, class 3: Secondary | ICD-10-CM

## 2021-12-04 DIAGNOSIS — N1831 Chronic kidney disease, stage 3a: Secondary | ICD-10-CM

## 2021-12-04 DIAGNOSIS — J9601 Acute respiratory failure with hypoxia: Secondary | ICD-10-CM

## 2021-12-04 DIAGNOSIS — E278 Other specified disorders of adrenal gland: Secondary | ICD-10-CM

## 2021-12-04 DIAGNOSIS — A419 Sepsis, unspecified organism: Secondary | ICD-10-CM | POA: Insufficient documentation

## 2021-12-04 HISTORY — DX: Acute posthemorrhagic anemia: D62

## 2021-12-04 HISTORY — DX: Metabolic encephalopathy: G93.41

## 2021-12-04 LAB — GLUCOSE, CAPILLARY
Glucose-Capillary: 119 mg/dL — ABNORMAL HIGH (ref 70–99)
Glucose-Capillary: 125 mg/dL — ABNORMAL HIGH (ref 70–99)
Glucose-Capillary: 105 mg/dL — ABNORMAL HIGH (ref 70–99)
Glucose-Capillary: 134 mg/dL — ABNORMAL HIGH (ref 70–99)

## 2021-12-04 LAB — BASIC METABOLIC PANEL
Anion gap: 7 (ref 5–15)
BUN: 26 mg/dL — ABNORMAL HIGH (ref 8–23)
CO2: 22 mmol/L (ref 22–32)
Calcium: 8.9 mg/dL (ref 8.9–10.3)
Chloride: 107 mmol/L (ref 98–111)
Creatinine, Ser: 1.32 mg/dL — ABNORMAL HIGH (ref 0.61–1.24)
GFR, Estimated: >60 mL/min (ref >60)
Glucose, Bld: 113 mg/dL — ABNORMAL HIGH (ref 70–99)
Potassium: 4.1 mmol/L (ref 3.5–5.1)
Sodium: 136 mmol/L (ref 135–145)

## 2021-12-04 LAB — PHOSPHORUS: Phosphorus: 3.8 mg/dL (ref 2.5–4.6)

## 2021-12-04 LAB — MAGNESIUM: Magnesium: 1.9 mg/dL (ref 1.7–2.4)

## 2021-12-04 MED ORDER — TRAVASOL 10 % IV SOLN
INTRAVENOUS | Status: AC
  Filled 2021-12-04: qty 684

## 2021-12-04 MED ORDER — ADULT MULTIVITAMIN W/MINERALS CH
1.0000 | ORAL_TABLET | Freq: Every day | ORAL | Status: DC
  Administered 2021-12-04 – 2021-12-07 (×4): 1 via ORAL
  Filled 2021-12-04 (×4): qty 1

## 2021-12-04 NOTE — Progress Notes (Signed)
11 Days Post-Op  Subjective: CC: Tolerating some diet. Having some bowel movements.  Working towards home health.  Objective: Vital signs in last 24 hours: Temp:  [98.3 F (36.8 C)-98.9 F (37.2 C)] 98.6 F (37 C) (07/01 0635) Pulse Rate:  [63-95] 63 (07/01 0851) Resp:  [18-20] 18 (07/01 0635) BP: (119-141)/(68-79) 134/72 (07/01 0851) SpO2:  [95 %-98 %] 96 % (07/01 0635) Last BM Date : 12/02/21  Intake/Output from previous day: 06/30 0701 - 07/01 0700 In: 1282.8 [P.O.:60; I.V.:967; IV Piggyback:245.8] Out: 2545 [Urine:2450; Drains:95] I have messaged the RN to clarify what the 1900 mL of "other" is from yesterday Intake/Output this shift: Total I/O In: 546.9 [P.O.:150; I.V.:357.5; IV Piggyback:39.4] Out: 710 [Urine:700; Drains:10]   PE: Gen:  Alert, NAD, pleasant Card:  Reg rate Pulm: Rate and effort normal, on o2 Abd: Soft, obese, improved L sided ttp without rigidity or guarding. Otherwise NT. Left-sided drain light brown purulent drainage. Right sided surgical drain is SS, on last evaluation of the wound 6/30, wound with healthy-appearing granulation tissue at the base and sides of the wound as noted in the picture below.  No evidence of dehiscence.       Lab Results:  Recent Labs    12/02/21 1131 12/03/21 0340  WBC 9.9 8.9  HGB 9.1* 8.6*  HCT 29.0* 27.7*  PLT 264 239    BMET Recent Labs    12/03/21 0340 12/04/21 0335  NA 135 136  K 4.5 4.1  CL 106 107  CO2 22 22  GLUCOSE 115* 113*  BUN 28* 26*  CREATININE 1.15 1.32*  CALCIUM 8.7* 8.9    PT/INR No results for input(s): "LABPROT", "INR" in the last 72 hours. CMP     Component Value Date/Time   NA 136 12/04/2021 0335   NA 133 (A) 06/24/2021 0000   K 4.1 12/04/2021 0335   CL 107 12/04/2021 0335   CO2 22 12/04/2021 0335   GLUCOSE 113 (H) 12/04/2021 0335   BUN 26 (H) 12/04/2021 0335   BUN 18 06/24/2021 0000   CREATININE 1.32 (H) 12/04/2021 0335   CALCIUM 8.9 12/04/2021 0335   PROT 7.1  12/02/2021 0341   ALBUMIN 1.8 (L) 12/02/2021 0341   AST 31 12/02/2021 0341   ALT 17 12/02/2021 0341   ALKPHOS 39 12/02/2021 0341   BILITOT 0.6 12/02/2021 0341   GFRNONAA >60 12/04/2021 0335   GFRAA >60 02/19/2018 1123   Lipase     Component Value Date/Time   LIPASE 40 11/07/2021 1029    Studies/Results: No results found.  Anti-infectives: Anti-infectives (From admission, onward)    Start     Dose/Rate Route Frequency Ordered Stop   12/03/21 0000  caspofungin (CANCIDAS) IVPB        70 mg Intravenous Every 24 hours 12/03/21 1517 12/26/21 2359   12/03/21 0000  ertapenem [facility]) IVPB        1 g Intravenous Every 24 hours 12/03/21 1517 12/26/21 2359   11/30/21 2200  piperacillin-tazobactam (ZOSYN) IVPB 3.375 g        3.375 g 12.5 mL/hr over 240 Minutes Intravenous Every 8 hours 11/30/21 1435     11/29/21 0600  vancomycin (VANCOREADY) IVPB 1250 mg/250 mL  Status:  Discontinued        1,250 mg 166.7 mL/hr over 90 Minutes Intravenous Every 24 hours 11/28/21 0759 11/29/21 0727   11/28/21 0500  vancomycin (VANCOCIN) IVPB 1000 mg/200 mL premix  Status:  Discontinued  1,000 mg 200 mL/hr over 60 Minutes Intravenous Every 24 hours 11/27/21 0414 11/28/21 0759   11/27/21 0600  meropenem (MERREM) 1 g in sodium chloride 0.9 % 100 mL IVPB  Status:  Discontinued        1 g 200 mL/hr over 30 Minutes Intravenous Every 8 hours 11/27/21 0346 11/30/21 1435   11/27/21 0430  vancomycin (VANCOREADY) IVPB 2000 mg/400 mL        2,000 mg 200 mL/hr over 120 Minutes Intravenous  Once 11/27/21 0346 11/27/21 0655   11/24/21 1400  piperacillin-tazobactam (ZOSYN) IVPB 3.375 g  Status:  Discontinued        3.375 g 12.5 mL/hr over 240 Minutes Intravenous Every 8 hours 11/24/21 1018 11/27/21 0323   11/24/21 1200  Ampicillin-Sulbactam (UNASYN) 3 g in sodium chloride 0.9 % 100 mL IVPB  Status:  Discontinued        3 g 200 mL/hr over 30 Minutes Intravenous Every 6 hours 11/24/21 0900 11/24/21 1018    11/23/21 0830  piperacillin-tazobactam (ZOSYN) IVPB 3.375 g  Status:  Discontinued        3.375 g 12.5 mL/hr over 240 Minutes Intravenous Every 8 hours 11/23/21 0736 11/24/21 0858   11/22/21 1200  Ampicillin-Sulbactam (UNASYN) 3 g in sodium chloride 0.9 % 100 mL IVPB  Status:  Discontinued        3 g 200 mL/hr over 30 Minutes Intravenous Every 6 hours 11/22/21 0923 11/23/21 0736   11/20/21 1645  micafungin (MYCAMINE) 100 mg in sodium chloride 0.9 % 100 mL IVPB        100 mg 105 mL/hr over 1 Hours Intravenous Every 24 hours 11/20/21 1554     11/08/21 0730  vancomycin (VANCOREADY) IVPB 1750 mg/350 mL        1,750 mg 175 mL/hr over 120 Minutes Intravenous  Once 11/08/21 0641 11/08/21 0908   11/08/21 0641  vancomycin variable dose per unstable renal function (pharmacist dosing)  Status:  Discontinued         Does not apply See admin instructions 11/08/21 0641 11/08/21 1138   11/07/21 2000  piperacillin-tazobactam (ZOSYN) IVPB 3.375 g  Status:  Discontinued        3.375 g 12.5 mL/hr over 240 Minutes Intravenous Every 8 hours 11/07/21 1921 11/22/21 0923   11/07/21 1430  piperacillin-tazobactam (ZOSYN) IVPB 3.375 g        3.375 g 100 mL/hr over 30 Minutes Intravenous  Once 11/07/21 1418 11/07/21 1535        Assessment/Plan POD 11 s/p exploratory laparotomy and drainage of pelvic abscess by Dr. 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 The pelvic fluid collection that was noted on CT was able to be drained w/ 70F blake drain left behind in this area. IR drains were removed. There is question if his disease process was related to his small bowel rather than being diverticular in nature.  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 There does not appear to be a good surgical option  at this point either.  - S/p IR drainage 6/24 of LLQ fluid collection. Monitor drain, stable light brown/purulent today  - Cont 1/2 TPN till taking in more. On soft diet + shakes/supplements and undergoing calorie count. Cont Remeron for appetite stimulation.  - Cont JP drain. SS - Cont  abx. Prior IR cx w/ Candida tropicalis, E. Coli and Bacteroides. ID is following. They plan to d/c on IV abx  - VAC M/W/F. WOCN following. - PT/OT for mobilization - OT recommending no f/u. PT rec HH PT  - TOC consult - HH PT/RN and home vac  - Appreciate TRH and IR assistance with his care. - He plans to stay with his mom at discharge with his daughter and daughter-in-law available for assistance   FEN - Soft diet, shakes, Remeron, IVF per primary, wean 1/2 TPN. Calorie Count VTE - SCDs, subq heparin. Cardiology no longer recommending anticoagulation and plans for 30-day monitor at discharge ID - Zosyn and micafungin    ABL anemia - stable A. Fib with RVR - resolved, lopressor.  AKI - resolved Acute respiratory failure with hypoxia - improved. 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

## 2021-12-04 NOTE — Assessment & Plan Note (Signed)
BP controlled - Continue amlodipine, metoprolol

## 2021-12-04 NOTE — Progress Notes (Signed)
PHARMACY - TOTAL PARENTERAL NUTRITION CONSULT NOTE   Indication: Prolonged ileus  Patient Measurements: Height: 5' 6.93" (170 cm) Weight: 117.1 kg (258 lb 2.5 oz) IBW/kg (Calculated) : 65.94 TPN AdjBW (KG): 79.8 Body mass index is 40.52 kg/m. Usual Weight:   Assessment:  66 yo male admitted for sepsis d/t multiple diverticular IAAs. Failed conservative measures and underwent ex lap 6/20. Unable to perform colectomy or diversion d/t extensively frozen bowel. LLQ drain placed by IR on 6/24. Pharmacy consulted to dose TPN with prolonged ileus.  Glucose / Insulin: Hx DM on PTA metformin, SGLT2, GLP1 - CBGs goal <180. Range: 113-122 - 2 units SSI in 24 hrs, on mSSI q6h Electrolytes: Electrolytes WNL, except CorrCa (10.5) remains slightly elevated with Ca removed from TPN.  Renal: SCr up to 1.3. BUN slightly elevated Hepatic: albumin low; LFTs, bili, TG all WNL  - new cirrhosis Dx on imaging this admission Intake / Output; MIVF:  - drain placed by IR 6/24, total output: 95 mL  - 24 hr UOP: 2450 mL + 1 unmeasured  - PO intake: minimal GI Surgeries / Procedures:  - 6/20 Pt post-op exploratory laparotomy and drainage of pelvic abscess. -6/24: CT guided drain placement for LLQ abscess by IR  Central access: 11/08/2021 TPN start date: 11/24/2021  Nutritional Goals: Goal TPN rate is 95 mL/hr (provides 130 g protein and 2366 kcals per day)  RD Assessment: Estimated Needs Total Energy Estimated Needs: 2300-2600 kcal Total Protein Estimated Needs: 115-130 grams Total Fluid Estimated Needs: >/= 2.4 L/day  Current Nutrition:  TPN at half-rate Diet: CLD on 6/26 > FLD on 6/27 > soft diet 6/29 Mirtazapine started 6/28 for appetite stimulation  Day 1 calorie count (12/03/21):  Lunch: 17 kcal and 1g of protein Dinner: 195 kcal and 2g protein Breakfast: 10 kcal and 1g protein Supplements: 125 kcal and 5g protein  Plan:  At 1800: Continue TPN at half-rate of 50 mL/hr  (provides 68 g  protein, 1245 kcal) Electrolytes in TPN:  Na 100 mEq/L K 20 mEq/L Ca 0 mEq/L  Mg 5 mEq/L Phos 10 mmol/L Cl:Ac ratio 1:1 Change to Multivitamin oral tablet Continue mSSI q6h Monitor TPN labs on Mon/Thurs.  Monitor for ability to wean off of [place] PharmD, BCPS [facility] main pharmacy [phone] 12/04/2021 11:00 AM

## 2021-12-04 NOTE — Hospital Course (Signed)
Mr. Lobo is a 66 y.o. M with HTN, DM, OSA, CKD IIIa, and smoking who presented with sepsis from intra-abdominal abscesses.  6/4 admission, started zosyn 6/5 moved to ICU septic shock, IR drain placed in abdomen, grew e-coli and mixed anaerobic fluid 6/13 drain removed by IR  6/14 recurrent pelvic abscess on CT abdomen 6/15 2 new drains placed by IR in lower abdomen bilaterally, grew candida tropicalis 6/17 ID consult: micafungin 6/20 ex-lap, bilateral open ended ureteral catheter placement, drain placed in pelvic fluid collection 6/24 pccm consulted for confusion, fever tachycardia, remains on zosyn, intubated for airway protection, increased work of breathing with confusion.  Respiratory alkalosis on ABG, lactic acid elevated at 3.7. daughter confirms full code status by phone. 6/25 extubated. A line removed. 6/26 vanc stopped 6/27, transferred under TRH.

## 2021-12-04 NOTE — Assessment & Plan Note (Signed)
Continue CPAP at night ?

## 2021-12-04 NOTE — Assessment & Plan Note (Signed)
-   Outpatient follow-up °

## 2021-12-04 NOTE — Progress Notes (Signed)
Progress Note   Patient: Robert Larson DOB: [birth year] DOA: 11/07/2021     27 DOS: the patient was seen and examined on 12/04/2021 at 2:45PM      Brief hospital course: Robert Larson is a 66 y.o. M with HTN, DM, OSA, CKD IIIa, and smoking who presented with sepsis from intra-abdominal abscesses.  6/4 admission, started zosyn 6/5 moved to ICU septic shock, IR drain placed in abdomen, grew e-coli and mixed anaerobic fluid 6/13 drain removed by IR  6/14 recurrent pelvic abscess on CT abdomen 6/15 2 new drains placed by IR in lower abdomen bilaterally, grew candida tropicalis 6/17 ID consult: micafungin 6/20 ex-lap, bilateral open ended ureteral catheter placement, drain placed in pelvic fluid collection 6/24 pccm consulted for confusion, fever tachycardia, remains on zosyn, intubated for airway protection, increased work of breathing with confusion.  Respiratory alkalosis on ABG, lactic acid elevated at 3.7. daughter confirms full code status by phone. 6/25 extubated. A line removed. 6/26 vanc stopped 6/27, transferred under TRH.     Assessment and Plan: * Septic shock (Robert Larson)    Intra-abdominal abscesses s/p perc drainage 11/08/2021 S/p exploratory laparotomy and drainage of pelvic abscess by Robert Larson on 6/20 - Continue TPN - Continue Zosyn, micafungin, plan for 3 more weeks at d/c  Acute blood loss anemia Hgb stable  Acute metabolic encephalopathy Resolved  Paroxysmal atrial fibrillation with RVR ([place]) See prior - Hold apixaban - Continue metoprolol - Outpatient cardiology follow up  Obesity, Class III, BMI 40-49.9 (morbid obesity) ([place])    Pressure injury of skin    Tobacco abuse    Renal cyst    Adrenal nodule ([place]) - Outpatient follow up  OSA (obstructive sleep apnea) - Continue CPAP at night  Acute renal failure superimposed on stage 3a chronic kidney disease ([place]) Cr resolved to baseline  Type 2 diabetes mellitus with morbid  obesity (H[facility]) Glucoses good - Continue SS correction insulin  Essential hypertension BP controlled - Continue amlodipine, metoprolol          Subjective: Very mild confusion persist.  He has mild abdominal pain.  No vomiting, fever, dyspnea, swelling.     Physical Exam: Vitals:   12/03/21 2023 12/04/21 0635 12/04/21 0851 12/04/21 1455  BP: 135/79 (!) 141/72 134/72 131/74  Pulse: 95 63 63 66  Resp: '20 18  14  '$ Temp: 98.3 F (36.8 C) 98.6 F (37 C)  98 F (36.7 C)  TempSrc: Oral Oral  Oral  SpO2: 95% 96%  97%  Weight:      Height:       Obese adult male, lying in bed, appears weak and relatively debilitated Tachycardic, no murmurs, no peripheral edema Respiratory rate normal, lungs clear without rales or wheezes Abdomen: Diffusely tender mildly, but no guarding, no rebound, no distention Attention normal, affect blunted, judgment insight appear relatively normal, moves upper extremities with generalized weakness but symmetric strength, speech fluent       Data Reviewed: Creatinine slightly up to 1.3 Potassium normal Glucose normal Hemoglobin 8.6 and stable  Family Communication: Daughter, mother at the bedside    Disposition: Status is: Inpatient The patient was admitted for septic shock due to intra-abdominal abscesses.  He had to have exploratory laparotomy, he said slow recovery of bowel function.  He will need to leave the hospital with IV antibiotics and IV TPN, and we continue to make arrangements for home health nursing and equipment  Hopefully this will be in place by tomorrow we will  discharge tomorrow        Author: Edwin Dada, MD 12/04/2021 2:59 PM  For on call review [email or web address].

## 2021-12-04 NOTE — Assessment & Plan Note (Signed)
Cr resolved to baseline. 

## 2021-12-04 NOTE — Assessment & Plan Note (Signed)
See prior - Hold apixaban - Continue metoprolol - Outpatient cardiology follow up

## 2021-12-04 NOTE — Assessment & Plan Note (Addendum)
S/p exploratory laparotomy and drainage of pelvic abscessby Dr. Donne Hazel on 6/20 - Continue TPN - Continue Zosyn, micafungin, plan for 3 more weeks at d/c

## 2021-12-04 NOTE — TOC Progression Note (Signed)
Transition of Care [facility]) - Progression Note    Patient Details  Name: Robert Larson MRN: [id number] Date of Birth: 1955-07-25  [facility]) CM/SW Contact  Ross Ludwig, [place] Phone Number: 12/04/2021, 6:00 PM  Clinical Narrative:     CSW spoke to [facility] from [place] [facility] she has completed teaching with patient and family and can discharge home once medically ready for discharge.  CSW to continue to follow patient's progress throughout discharge planning.    Expected Discharge Plan: [place] Barriers to Discharge: Continued Medical Work up  Expected Discharge Plan and Services Expected Discharge Plan: [place]                                               Social Determinants of Health (SDOH) Interventions    Readmission Risk Interventions     No data to display

## 2021-12-04 NOTE — Assessment & Plan Note (Signed)
Hgb stable 

## 2021-12-04 NOTE — Assessment & Plan Note (Signed)
Resolved

## 2021-12-04 NOTE — Assessment & Plan Note (Signed)
Glucoses good - Continue SS correction insulin

## 2021-12-05 LAB — BASIC METABOLIC PANEL
Anion gap: 6 (ref 5–15)
BUN: 27 mg/dL — ABNORMAL HIGH (ref 8–23)
CO2: 21 mmol/L — ABNORMAL LOW (ref 22–32)
Calcium: 8.7 mg/dL — ABNORMAL LOW (ref 8.9–10.3)
Chloride: 107 mmol/L (ref 98–111)
Creatinine, Ser: 1.32 mg/dL — ABNORMAL HIGH (ref 0.61–1.24)
GFR, Estimated: >60 mL/min (ref >60)
Glucose, Bld: 118 mg/dL — ABNORMAL HIGH (ref 70–99)
Potassium: 4 mmol/L (ref 3.5–5.1)
Sodium: 134 mmol/L — ABNORMAL LOW (ref 135–145)

## 2021-12-05 LAB — GLUCOSE, CAPILLARY
Glucose-Capillary: 117 mg/dL — ABNORMAL HIGH (ref 70–99)
Glucose-Capillary: 133 mg/dL — ABNORMAL HIGH (ref 70–99)
Glucose-Capillary: 114 mg/dL — ABNORMAL HIGH (ref 70–99)

## 2021-12-05 MED ORDER — MIRTAZAPINE 7.5 MG PO TABS: 7.5000 mg | ORAL_TABLET | Freq: Every day | ORAL | 3 refills | Status: DC

## 2021-12-05 MED ORDER — OXYCODONE HCL 5 MG PO TABS: 5.0000 mg | ORAL_TABLET | Freq: Four times a day (QID) | ORAL | 0 refills | Status: DC | PRN

## 2021-12-05 MED ORDER — PIPERACILLIN-TAZOBACTAM 3.375 G IVPB
3.3750 g | Freq: Three times a day (TID) | INTRAVENOUS | Status: DC
  Administered 2021-12-05 – 2021-12-07 (×6): 3.375 g via INTRAVENOUS
  Filled 2021-12-05 (×6): qty 50

## 2021-12-05 MED ORDER — AMLODIPINE BESYLATE 10 MG PO TABS: 10.0000 mg | ORAL_TABLET | Freq: Every day | ORAL | 3 refills | Status: DC

## 2021-12-05 MED ORDER — ZINC SULFATE 220 (50 ZN) MG PO CAPS: 220.0000 mg | ORAL_CAPSULE | Freq: Every day | ORAL | Status: DC

## 2021-12-05 MED ORDER — ASCORBIC ACID 500 MG PO TABS: 500.0000 mg | ORAL_TABLET | Freq: Every day | ORAL | Status: DC

## 2021-12-05 MED ORDER — SODIUM CHLORIDE 0.9 % IV SOLN
1.0000 g | Freq: Once | INTRAVENOUS | Status: DC
  Filled 2021-12-05: qty 1

## 2021-12-05 MED ORDER — PROSOURCE PLUS PO LIQD: 30.0000 mL | Freq: Three times a day (TID) | ORAL | Status: DC

## 2021-12-05 NOTE — Progress Notes (Signed)
PHARMACY - TOTAL PARENTERAL NUTRITION CONSULT NOTE   Indication: Prolonged ileus  Patient Measurements: Height: 5' 6.93" (170 cm) Weight: 112.5 kg (248 lb 0.3 oz) IBW/kg (Calculated) : 65.94 TPN AdjBW (KG): 79.8 Body mass index is 38.93 kg/m. Usual Weight:   Assessment:  66 yo male admitted for sepsis d/t multiple diverticular IAAs. Failed conservative measures and underwent ex lap 6/20. Unable to perform colectomy or diversion d/t extensively frozen bowel. LLQ drain placed by IR on 6/24. Pharmacy consulted to dose TPN with prolonged ileus.  Glucose / Insulin: Hx DM on PTA metformin, SGLT2, GLP1 - CBGs goal <180. Range: 105-134 - moderate SSI: 4 units/ 24 hrs Electrolytes: low Na, low Cl, CorrCa (10.4) remains slightly elevated with Ca removed from TPN.  Renal: SCr remains elevated at 1.3. BUN slightly elevated Hepatic: albumin low; LFTs, bili, TG all WNL  - new cirrhosis Dx on imaging this admission Intake / Output; MIVF:  - drain placed by IR 6/24, total output: 85 mL  - 24 hr UOP: 2450 mL + 1 unmeasured  GI Surgeries / Procedures:  - 6/20 Pt post-op exploratory laparotomy and drainage of pelvic abscess. -6/24: CT guided drain placement for LLQ abscess by IR  Central access: 11/08/2021 TPN start date: 11/24/2021  Nutritional Goals: Goal TPN rate is 95 mL/hr (provides 130 g protein and 2366 kcals per day)  RD Assessment: Estimated Needs Total Energy Estimated Needs: 2300-2600 kcal Total Protein Estimated Needs: 115-130 grams Total Fluid Estimated Needs: >/= 2.4 L/day  Current Nutrition:  TPN at half-rate Diet: CLD on 6/26 > FLD on 6/27 > soft diet 6/29 Mirtazapine started 6/28 for appetite stimulation  Plan:  Discharge planned for today, Per Dr. Thermon Leyland, [place] to discontinue TPN at discharge. Continue to encourage protein/nutritional supplements.   Gretta Arab PharmD, BCPS [facility] main pharmacy [phone] 12/05/2021 8:54 AM

## 2021-12-05 NOTE — Progress Notes (Signed)
12 Days Post-Op  Subjective: CC: Tolerating some diet. Having some bowel movements.  Working towards home, but some family members are sick today.  Objective: Vital signs in last 24 hours: Temp:  [98 F (36.7 C)-98.9 F (37.2 C)] 98.6 F (37 C) (07/02 0456) Pulse Rate:  [59-94] 59 (07/02 0456) Resp:  [14-15] 15 (07/02 0456) BP: (130-134)/(72-91) 130/73 (07/02 0456) SpO2:  [96 %-98 %] 98 % (07/02 0456) Weight:  [112.5 kg] 112.5 kg (07/02 0000) Last BM Date : 12/04/21  Intake/Output from previous day: 07/01 0701 - 07/02 0700 In: 1833.8 [P.O.:150; I.V.:1409.7; IV Piggyback:264.1] Out: 2535 [Urine:2450; Drains:85] I have messaged the RN to clarify what the 1900 mL of "other" is from yesterday Intake/Output this shift: Total I/O In: 766.9 [I.V.:611.8; IV Piggyback:155.1] Out: 1160 [Urine:1100; Drains:60]   PE: Gen:  Alert, NAD, pleasant Card:  Reg rate Pulm: Rate and effort normal, on o2 Abd: Soft, obese, improved L sided ttp without rigidity or guarding. Otherwise NT. Left-sided drain light brown purulent drainage. Right sided surgical drain is SS, on last evaluation of the wound 6/30, wound with healthy-appearing granulation tissue at the base and sides of the wound as noted in the picture below.  No evidence of dehiscence.       Lab Results:  Recent Labs    12/02/21 1131 12/03/21 0340  WBC 9.9 8.9  HGB 9.1* 8.6*  HCT 29.0* 27.7*  PLT 264 239    BMET Recent Labs    12/04/21 0335 12/05/21 0340  NA 136 134*  K 4.1 4.0  CL 107 107  CO2 22 21*  GLUCOSE 113* 118*  BUN 26* 27*  CREATININE 1.32* 1.32*  CALCIUM 8.9 8.7*    PT/INR No results for input(s): "LABPROT", "INR" in the last 72 hours. CMP     Component Value Date/Time   NA 134 (L) 12/05/2021 0340   NA 133 (A) 06/24/2021 0000   K 4.0 12/05/2021 0340   CL 107 12/05/2021 0340   CO2 21 (L) 12/05/2021 0340   GLUCOSE 118 (H) 12/05/2021 0340   BUN 27 (H) 12/05/2021 0340   BUN 18 06/24/2021 0000    CREATININE 1.32 (H) 12/05/2021 0340   CALCIUM 8.7 (L) 12/05/2021 0340   PROT 7.1 12/02/2021 0341   ALBUMIN 1.8 (L) 12/02/2021 0341   AST 31 12/02/2021 0341   ALT 17 12/02/2021 0341   ALKPHOS 39 12/02/2021 0341   BILITOT 0.6 12/02/2021 0341   GFRNONAA >60 12/05/2021 0340   GFRAA >60 02/19/2018 1123   Lipase     Component Value Date/Time   LIPASE 40 11/07/2021 1029    Studies/Results: No results found.  Anti-infectives: Anti-infectives (From admission, onward)    Start     Dose/Rate Route Frequency Ordered Stop   12/03/21 0000  caspofungin (CANCIDAS) IVPB        70 mg Intravenous Every 24 hours 12/03/21 1517 12/26/21 2359   12/03/21 0000  ertapenem [facility]) IVPB        1 g Intravenous Every 24 hours 12/03/21 1517 12/26/21 2359   11/30/21 2200  piperacillin-tazobactam (ZOSYN) IVPB 3.375 g        3.375 g 12.5 mL/hr over 240 Minutes Intravenous Every 8 hours 11/30/21 1435     11/29/21 0600  vancomycin (VANCOREADY) IVPB 1250 mg/250 mL  Status:  Discontinued        1,250 mg 166.7 mL/hr over 90 Minutes Intravenous Every 24 hours 11/28/21 0759 11/29/21 0727   11/28/21 0500  vancomycin (  VANCOCIN) IVPB 1000 mg/200 mL premix  Status:  Discontinued        1,000 mg 200 mL/hr over 60 Minutes Intravenous Every 24 hours 11/27/21 0414 11/28/21 0759   11/27/21 0600  meropenem (MERREM) 1 g in sodium chloride 0.9 % 100 mL IVPB  Status:  Discontinued        1 g 200 mL/hr over 30 Minutes Intravenous Every 8 hours 11/27/21 0346 11/30/21 1435   11/27/21 0430  vancomycin (VANCOREADY) IVPB 2000 mg/400 mL        2,000 mg 200 mL/hr over 120 Minutes Intravenous  Once 11/27/21 0346 11/27/21 0655   11/24/21 1400  piperacillin-tazobactam (ZOSYN) IVPB 3.375 g  Status:  Discontinued        3.375 g 12.5 mL/hr over 240 Minutes Intravenous Every 8 hours 11/24/21 1018 11/27/21 0323   11/24/21 1200  Ampicillin-Sulbactam (UNASYN) 3 g in sodium chloride 0.9 % 100 mL IVPB  Status:  Discontinued        3  g 200 mL/hr over 30 Minutes Intravenous Every 6 hours 11/24/21 0900 11/24/21 1018   11/23/21 0830  piperacillin-tazobactam (ZOSYN) IVPB 3.375 g  Status:  Discontinued        3.375 g 12.5 mL/hr over 240 Minutes Intravenous Every 8 hours 11/23/21 0736 11/24/21 0858   11/22/21 1200  Ampicillin-Sulbactam (UNASYN) 3 g in sodium chloride 0.9 % 100 mL IVPB  Status:  Discontinued        3 g 200 mL/hr over 30 Minutes Intravenous Every 6 hours 11/22/21 0923 11/23/21 0736   11/20/21 1645  micafungin (MYCAMINE) 100 mg in sodium chloride 0.9 % 100 mL IVPB        100 mg 105 mL/hr over 1 Hours Intravenous Every 24 hours 11/20/21 1554     11/08/21 0730  vancomycin (VANCOREADY) IVPB 1750 mg/350 mL        1,750 mg 175 mL/hr over 120 Minutes Intravenous  Once 11/08/21 0641 11/08/21 0908   11/08/21 0641  vancomycin variable dose per unstable renal function (pharmacist dosing)  Status:  Discontinued         Does not apply See admin instructions 11/08/21 0641 11/08/21 1138   11/07/21 2000  piperacillin-tazobactam (ZOSYN) IVPB 3.375 g  Status:  Discontinued        3.375 g 12.5 mL/hr over 240 Minutes Intravenous Every 8 hours 11/07/21 1921 11/22/21 0923   11/07/21 1430  piperacillin-tazobactam (ZOSYN) IVPB 3.375 g        3.375 g 100 mL/hr over 30 Minutes Intravenous  Once 11/07/21 1418 11/07/21 1535        Assessment/Plan POD 11 s/p exploratory laparotomy and drainage of pelvic abscess by Dr. 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 The pelvic fluid collection that was noted on CT was able to be drained w/ 26F blake drain left behind in this area. IR drains were removed. There is question if his disease process was related to his small bowel rather than being diverticular in nature.  His small bowel was fused and there was no  option surgically except drainage. There does not appear to be a good surgical option at this point either.  - S/p IR drainage 6/24 of LLQ fluid collection. Monitor drain, stable light brown/purulent today  - Cont 1/2 TPN till taking in more. On soft diet + shakes/supplements  and undergoing calorie count. Cont Remeron for appetite stimulation.  - Cont JP drain. SS - Cont abx. Prior IR cx w/ Candida tropicalis, E. Coli and Bacteroides. ID is following. They plan to d/c on IV abx  - VAC M/W/F. WOCN following. - PT/OT for mobilization - OT recommending no f/u. PT rec HH PT  - TOC consult - HH PT/RN and home vac  - Appreciate TRH and IR assistance with his care. - He plans to stay with his mom at discharge with his daughter and daughter-in-law available for assistance   Follow up with Dr. Donne Hazel, keep drains at discharge  FEN - Soft diet, shakes, Remeron, IVF per primary, wean 1/2 TPN. Calorie Count VTE - SCDs, subq heparin. Cardiology no longer recommending anticoagulation and plans for 30-day monitor at discharge ID - Zosyn and micafungin    ABL anemia - stable A. Fib with RVR - resolved, lopressor.  AKI - resolved Acute respiratory failure with hypoxia - improved. 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

## 2021-12-05 NOTE — Progress Notes (Signed)
Pt refused to wear his home cpap tonight.

## 2021-12-05 NOTE — TOC Progression Note (Addendum)
Transition of Care [facility]) - Progression Note    Patient Details  Name: Robert Larson MRN: [id number] Date of Birth: 11-30-1955  [facility]) CM/SW Contact  Ross Ludwig, [place] Phone Number: 12/05/2021, 1:50 PM  Clinical Narrative:     CSW was informed that patient has to go home on a wound vac.  CSW contacted bedside nurse and asked how long he has had it, per bedside nurse since the 20th of June.  CSW notified Pam from [employer] who said [facility] can provide the home IV antibiotics and the [facility] nursing for the wound vac, but [facility] has to get insurance authorization from [place] which manages [place] plan, but process can not be started on weekend.  CSW tried calling [employer] to get wound vac delivered to patient's room so he will have it before he discharges.  CSW called [employer] rep and left a message on voice mail unable to speak to anyone over the weekend.  CSW tried calling [employer] main office, and they said there no one available on the weekend.  CSW notified TOC leader on call she was going to try to contact [employer] rep as well but was unsuccessful.  CSW contacted bedside nurse, physician, Pam from [place], and patient's daughter.  Weekday TOC member to follow up on Monday with Pam from [place], patient's daughter, and [employer].  TOC to continue to follow patient's progress throughout discharge planning.  6:15pm  CSW received a phone call back from [place] from [place].  She will complete paperwork to get wound vac ordered, approved, and delivered tomorrow.  She will reach out to weekday [facility] worker tomorrow to complete the rest of paperwork with physician signature.  CSW updated Pam from [employer], bedside nurse, and attending physician.  Per Pam she will reach out to [facility] worker tomorrow to get the referral process started for [facility] RN through [employer].  TOC to continue to follow patient's progress throughout discharge planning.  Expected Discharge Plan: [place] Barriers to Discharge: Continued Medical Work up  Expected Discharge Plan and Services Expected Discharge Plan: [place]         Expected Discharge Date: 12/05/21                                     Social Determinants of Health (SDOH) Interventions    Readmission Risk Interventions     No data to display

## 2021-12-05 NOTE — Discharge Summary (Signed)
Physician Discharge Summary   Patient: Robert Larson MRN: [id number] DOB: [birth year]  Admit date:     11/07/2021  Discharge date: 12/05/21  Discharge Physician: Robert Larson   PCP: Robert Sewer, NP     Recommendations at discharge:  Follow up with General Surgery as directed Follow up with PCP Robert Larson in 1 week After resolution of current illness, recommend liver MR to characterize liver hypodensity noted on imaging (see report below)     Discharge Diagnoses: Principal Problem:   Septic shock ([place]) Active Problems:   Intra-abdominal abscesses s/p perc drainage 11/08/2021   Essential hypertension   Type 2 diabetes mellitus with morbid obesity ([place])   Acute renal failure superimposed on stage 3a chronic kidney disease (HCC)   OSA (obstructive sleep apnea)   Adrenal nodule (HCC)   Renal cyst   Tobacco abuse   Pressure injury of skin   Obesity, Class III, BMI 40-49.9 (morbid obesity) ([place])   Paroxysmal atrial fibrillation with RVR (HCC)   Acute respiratory failure with hypoxia ([place])   Acute metabolic encephalopathy   Acute blood loss anemia      Hospital Course: Robert Larson is a 66 y.o. M with HTN, DM, OSA, CKD IIIa, and smoking who presented with sepsis from intra-abdominal abscesses.  6/4 Admitted on antibiotics.  CT showed intra-abdominal abscesses. 6/5 Worsened overnight and moved to ICU in septic shock, IR drain placed in abdomen, culture growing E coli and mixed anaerobic fluid 6/13 Drain removed by IR  6/14 Repeat CT showed recurrent pelvic abscess 6/15 Two new drains placed by IR in lower abdomen bilaterally, culture growing candida tropicalis 6/17 ID consulted, micafungin added 6/20 Patient underwent ex-lap, bilateral open ended ureteral catheter placement, wound vac placed, drain placed in pelvic fluid collection by Dr. Donne Larson 6/24 Patient developed new fever, confusion, tachycardica, Robert Larson reconsulted, intubated for hypoxic  respiratory failure  6/25 Extubated 6/26-7/2 PICC placed, treated with TPN, and worked with dietitian and therapy, as well as Robert Larson to arrange outpatient IV antibiotics, home health therapy                The [place] was reviewed for this patient prior to discharge.   Consultants: General Surgery, Infectious disease, Cardiology, Urology, Critical Care Procedures performed:  - CT guided percutaneous drain placement 6/15 by Dr. Anselm Larson x2 - Cystoscopy with bilateral retrograde pyelogram and bilateral open-ended ureteral catheter placement 6/20 by Dr. Gloriann Larson -  Exploratory laparotomy and drainage of pelvic abscess 6/20 by Dr. Donne Larson - CT guided percutaneous drain placement 6/24 by Dr. Kathlene Larson    Disposition: Home health   DISCHARGE MEDICATION: Allergies as of 12/05/2021   No Known Allergies      Medication List     STOP taking these medications    dapagliflozin propanediol 5 MG Tabs tablet Commonly known as: FARXIGA   Farxiga 10 MG Tabs tablet Generic drug: dapagliflozin propanediol   FISH OIL PO   metFORMIN 500 MG 24 hr tablet Commonly known as: GLUCOPHAGE-XR   telmisartan-hydrochlorothiazide 80-25 MG tablet Commonly known as: MICARDIS HCT   Trulicity 1.5 DS/2.8JG Sopn Generic drug: Dulaglutide   varenicline 0.5 MG tablet Commonly known as: CHANTIX       TAKE these medications    (feeding supplement) PROSource Plus liquid Take 30 mLs by mouth 3 (three) times daily between meals.   amLODipine 10 MG tablet Commonly known as: NORVASC Take 1 tablet (10 mg total) by mouth daily.   ascorbic acid 500  MG tablet Commonly known as: VITAMIN C Take 1 tablet (500 mg total) by mouth daily.   caspofungin  IVPB Commonly known as: CANCIDAS Inject 70 mg into the vein daily for 23 days. Indication:  Intra-abdominal infection/abscesses  First Dose: Yes Last Day of Therapy:  12/25/2021  Labs - Once weekly:  CBC/D and  BMP, Labs - Every other week:  ESR and CRP Method of administration: Elastomeric Method of administration may be changed at the discretion of home infusion pharmacist based upon assessment of the patient and/or caregiver's ability to self-administer the medication ordered.   ertapenem  IVPB Commonly known as: INVANZ Inject 1 g into the vein daily for 23 days. Indication:  Intra-abdominal infection/abscesses  First Dose: Yes Last Day of Therapy:  12/25/2021  Labs - Once weekly:  CBC/D and BMP, Labs - Every other week:  ESR and CRP Method of administration: Mini-Bag Plus / Gravity Method of administration may be changed at the discretion of home infusion pharmacist based upon assessment of the patient and/or caregiver's ability to self-administer the medication ordered.   metoprolol succinate 25 MG 24 hr tablet Commonly known as: TOPROL-XL Take 1 tablet (25 mg total) by mouth at bedtime. START with 1/2 pill for one week, then increase to 1 full pill. What changed: additional instructions   mirtazapine 7.5 MG tablet Commonly known as: REMERON Take 1 tablet (7.5 mg total) by mouth at bedtime.   oxyCODONE 5 MG immediate release tablet Commonly known as: Oxy IR/ROXICODONE Take 1 tablet (5 mg total) by mouth every 6 (six) hours as needed for severe pain.   zinc sulfate 220 (50 Zn) MG capsule Take 1 capsule (220 mg total) by mouth daily.               Durable Medical Equipment  (From admission, onward)           Start     Ordered   12/02/21 1447  For home use only DME Negative pressure wound device  Once       Question Answer Comment  Frequency of dressing change 3 times per week   Length of need 3 Months   Dressing type Foam   Amount of suction 125 mm/Hg   Pressure application Continuous pressure   Supplies 10 canisters and 15 dressings per month for duration of therapy      12/02/21 1447   12/02/21 1447  For home use only DME Walker rolling  Once       Question  Answer Comment  Walker: With [place] Wheels   Patient needs a walker to treat with the following condition Status post surgery      12/02/21 1447              Discharge Care Instructions  (From admission, onward)           Start     Ordered   12/05/21 0000  Discharge wound care:       Comments: As directed by [facility]   12/05/21 0845   12/03/21 0000  Change dressing on IV access line weekly and PRN  (Home infusion instructions - [facility] )        12/03/21 1517            Follow-up Information     Rolm Bookbinder, MD. Call on 12/24/2021.   Specialty: General Surgery Why: 7/21, arrive at 11:00 for paperwork. Please bring a copy of your photo ID and insurance card.  Contact information: [place] [address] [phone]         Aletta Edouard, MD. Schedule an appointment as soon as possible for a visit.   Specialties: Interventional Radiology, Radiology Contact information: [address] [place] [address] [phone]         Robert Larson, [place]. Schedule an appointment as soon as possible for a visit in 1 week(s).   Specialty: Family Medicine Contact information: [place] [address] [phone]         Janina Mayo, MD. Go to.   Specialty: Cardiology Contact information: [address] [place] [address] [phone]         Thayer Headings, MD. Schedule an appointment as soon as possible for a visit in 1 month(s).   Specialty: Infectious Diseases Contact information: [address] [place] [address] [phone]                 Discharge Instructions     [facility] pharmacist to adjust dose for Vancomycin, Aminoglycosides and other anti-infective therapies as requested by physician.   Complete by: As directed    [facility] to provide Cath Flo 2mg    Complete by: As  directed    Administer for PICC line occlusion and as ordered by physician for other access device issues.   Anaphylaxis Kit: Provided to treat any anaphylactic reaction to the medication being provided to the patient if First Dose or when requested by physician   Complete by: As directed    Epinephrine 1mg /ml vial / amp: Administer 0.3mg  (0.62ml) subcutaneously once for moderate to severe anaphylaxis, nurse to call physician and pharmacy when reaction occurs and call 911 if needed for immediate care   Diphenhydramine 50mg /ml IV vial: Administer 25-50mg  IV/IM PRN for first dose reaction, rash, itching, mild reaction, nurse to call physician and pharmacy when reaction occurs   Sodium Chloride 0.9% NS 553ml IV: Administer if needed for hypovolemic blood pressure drop or as ordered by physician after call to physician with anaphylactic reaction   Change dressing on IV access line weekly and PRN   Complete by: As directed    Discharge instructions   Complete by: As directed    From Dr. Loleta Books: You were admitted for abscess (infection collection) in your abdomen.  You had surgery by Dr. Donne Larson to drain this.  You should follow up with Dr. Donne Larson and his partners at [facility].  They will help you manage your wound vac, and your drains  Their number is below in the To Do section. If you don't already know the date of your follow up appointment, call them to find out on Monday.   Also, they have a nurse triage line, so if you have quesitons about new symptoms, or concerns with your wounds, or your nutrition, do not hesitate to call them.  To finish treating the infection: Take the caspofungin and ertapenem antibiotics as directed You should be taking these until around July 23 The home health agency should draw blood work once weekly to monitor this while you are on antibiotics You would be wise to call the infectious disease doctors (Dr. Linus Salmons, listed below in the To Do section) to  get an appointment with Dr. Linus Salmons or one of the other doctors in the [facility] for Infectious Disease wtihin the next 4-6 weeks  Go see your primary care doctor in the  next 2 weeks, to check in  STOP taking your [place] for now, you can restart this later STOP taking the telmisartan/HCTZ also, and replace it with the blood pressure medicine amlodipine for now Follow up with your primary care doctor to adjust  For your heart, while you were here, you were noted to be in an abnormal heart rhythm called "atrial fibrillation". Go see Dr. Harl Bowie in the next 4-6 weeks to evaluate this. They should contact you soon to arrange a heart monitor that you can wear at home for the next month   To promote nutrition: Take vitamin C and Zinc supplements for the next few months We recommend some nutritional supplements by mouth: Prosource Plus (up to three times a day) and Boost (once or twice a day) if you can Take the medicine mirtazapine to boost appetite   Discharge wound care:   Complete by: As directed    As directed by [facility]   Flush IV access with Sodium Chloride 0.9% and Heparin 10 units/ml or 100 units/ml   Complete by: As directed    Home infusion instructions - [facility]   Complete by: As directed    Instructions: Flush IV access with Sodium Chloride 0.9% and Heparin 10units/ml or 100units/ml   Change dressing on IV access line: Weekly and PRN   Instructions Cath Flo 57m: Administer for PICC Line occlusion and as ordered by physician for other access device   [facility] pharmacist to adjust dose for: Vancomycin, Aminoglycosides and other anti-infective therapies as requested by physician   Increase activity slowly   Complete by: As directed    Method of administration may be changed at the discretion of home infusion pharmacist based upon assessment of the patient and/or caregiver's ability to self-administer the medication ordered   Complete  by: As directed        Discharge Exam: Filed Weights   11/23/21 1041 11/27/21 0000 12/05/21 0000  Weight: 121.5 kg 117.1 kg 112.5 kg    General: Pt is alert, awake, not in acute distress, lying in bed, appears weak but interactive and appropriate Cardiovascular: RRR, nl S1-S2, no murmurs appreciated.   No LE edema.   Respiratory: Normal respiratory rate and rhythm.  CTAB without rales or wheezes. Abdominal: Abdomen with mild diffuse tenderness, wound around wound vac appears clean and not inflamed, all drains have scant fluid in them, no distension, no rigidity  Neuro/Psych: Strength symmetric in upper and lower extremities.  Judgment and insight appear mildly impaired.   Condition at discharge: fair  The results of significant diagnostics from this hospitalization (including imaging, microbiology, ancillary and laboratory) are listed below for reference.   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 Status post laparotomy 4 days ago with placement of surgical drain in residual pelvic fluid collection. The patient has become more critically ill with CT demonstrating loculated extraluminal air in the left lower quadrant. EXAM: CT GUIDED CATHETER DRAINAGE OF LEFT LOWER QUADRANT PERITONEAL ABSCESS ANESTHESIA/SEDATION: The patient is intubated, on pressors and was not given additional conscious sedation during the procedure. PROCEDURE: The procedure, risks, benefits, and alternatives were explained to the patient's daughter. Questions regarding the procedure were encouraged and answered. The patient's daughter understands and consents to the procedure. A time out was performed prior to initiating the procedure. CT of the lower abdomen and pelvis was performed in a supine  position. The left lower abdominal wall was prepped with chlorhexidine in a  sterile fashion, and a sterile drape was applied covering the operative field. A sterile gown and sterile gloves were used for the procedure. Local anesthesia was provided with 1% Lidocaine. Under CT guidance, an 18 gauge trocar needle was advanced into a left lower quadrant abscess. After confirming needle tip position, a guidewire was advanced into the collection and the needle removed. The percutaneous tract was dilated over the wire. A 12 French percutaneous drainage catheter was advanced over the wire and formed. Drainage catheter position was confirmed by CT. A fluid sample was aspirated from the drain and sent for culture analysis. The drainage catheter was connected to suction bulb drainage. The percutaneous drain was secured at the skin with a Prolene retention suture and StatLock device. A dressing was applied over the catheter exit site. RADIATION DOSE REDUCTION: This exam was performed according to the departmental dose-optimization program which includes automated exposure control, adjustment of the mA and/or kV according to patient size and/or use of iterative reconstruction technique. COMPLICATIONS: None FINDINGS: At the time of the drainage procedure, some of the collection that had previously contained only loculated air in the anterior left lower peritoneal cavity now also contains fluid and an elongated air-fluid level. After drainage catheter placement within this collection, there is return of purulent, brownish colored liquid. A sample was sent for culture analysis. There is good return of fluid as well as some air from the drain after placement. IMPRESSION: CT-guided percutaneous catheter drainage of left lower quadrant peritoneal abscess with return of air as well as brownish colored purulent fluid. A sample of fluid was sent for culture analysis. A 12 French drain was placed and attached to suction bulb drainage. Electronically Signed   By: Aletta Edouard M.D.   On: 11/27/2021 14:43   DG  CHEST PORT 1 VIEW  Result Date: 11/27/2021 CLINICAL DATA:  OG tube placement. EXAM: PORTABLE CHEST 1 VIEW COMPARISON:  11/27/2021. FINDINGS: The heart is enlarged the mediastinal contour is stable. The pulmonary vasculature is prominent. Lung volumes are low. Mild residual atelectasis is noted at the left lung base, improved from the prior exam. There is a small left pleural effusion. No pneumothorax. The right PICC line is stable. An enteric tube terminates in the stomach. The endotracheal tube terminates 4.9 cm above the carina. IMPRESSION: 1. Cardiomegaly with pulmonary vascular congestion. 2. Mild atelectasis at the left lung base. 3. Small left pleural effusion. 4. Lines and tubes as described above. Electronically Signed   By: Brett Fairy M.D.   On: 11/27/2021 04:26   CT HEAD WO CONTRAST (5MM)  Result Date: 11/27/2021 CLINICAL DATA:  Delirium, altered mental status EXAM: CT HEAD WITHOUT CONTRAST TECHNIQUE: Contiguous axial images were obtained from the base of the skull through the vertex without intravenous contrast. RADIATION DOSE REDUCTION: This exam was performed according to the departmental dose-optimization program which includes automated exposure control, adjustment of the mA and/or kV according to patient size and/or use of iterative reconstruction technique. COMPARISON:  None Available. FINDINGS: Brain: Normal anatomic configuration. No abnormal intra or extra-axial mass lesion or fluid collection. No abnormal mass effect or midline shift. No evidence of acute intracranial hemorrhage or infarct. Ventricular size is normal. Cerebellum unremarkable. Vascular: Unremarkable Skull: Intact Sinuses/Orbits: Paranasal sinuses are clear. Orbits are unremarkable. Other: Mastoid air cells and middle ear cavities are clear. IMPRESSION: No acute intracranial abnormality. Electronically Signed   By: Linwood Dibbles.D.  On: 11/27/2021 03:05   CT ABDOMEN PELVIS WO CONTRAST  Result Date:  11/27/2021 CLINICAL DATA:  Sepsis, altered mental status EXAM: CT ABDOMEN AND PELVIS WITHOUT CONTRAST TECHNIQUE: Multidetector CT imaging of the abdomen and pelvis was performed following the standard protocol without IV contrast. RADIATION DOSE REDUCTION: This exam was performed according to the departmental dose-optimization program which includes automated exposure control, adjustment of the mA and/or kV according to patient size and/or use of iterative reconstruction technique. COMPARISON:  11/22/2021 FINDINGS: Lower chest: Small left pleural effusion with associated left lower lobe collapse and consolidation is unchanged. Progressive ground-glass pulmonary infiltrate within the lingula and right middle lobe, likely infectious or inflammatory in nature. Cardiac size within normal limits. No pericardial effusion. Hepatobiliary: Previously noted intrahepatic hypoenhancing mass is not well appreciated on this noncontrast examination. The liver contour is mildly nodular and there is hypertrophy of the left hepatic lobe in keeping with changes of cirrhosis. No intra or extrahepatic biliary ductal dilation. Gallbladder unremarkable. Pancreas: Unremarkable Spleen: Unremarkable Adrenals/Urinary Tract: 1.5 cm right adrenal nodule and 2.5 cm left adrenal nodules are stable sore indeterminate with medius 10 UA shin of 27-29 Hounsfield units. The kidneys are normal in size and position. Multiple simple cortical cysts are again identified bilaterally, better appreciated on prior examination. No further follow-up is recommended for these lesions. No hydronephrosis. No intrarenal or ureteral calculi. Foley catheter balloon is seen within a decompressed bladder lumen. Stomach/Bowel: Interval development of a moderate free intraperitoneal gas, possibly postsurgical in nature. Laparotomy incision now identified with wound VAC in place superiorly. Deep abdominal fascia appears intact though there is superficial dehiscence of the  laparotomy incision. Right lower quadrant Blake drain is seen looped within the pelvis. Small amount of free intraperitoneal fluid is seen within the pelvis. Previously noted right lower quadrant pigtail drainage catheter has been removed. A a loculated gas collection is seen along the right lateral aspect of the abdomen extending into the deep pelvis approaching the [facility] drain. This measures at least 5.3 x 9.9 by 21 cm in greatest dimension. No significant associated fluid component is identified with this collection. A small loculated curvilinear gas and fluid collection is identified within the deep pelvis measuring roughly 5.7 x 6.1 cm at axial image # 66/3. The stomach, small bowel, and large bowel are otherwise unremarkable. Appendix normal. Vascular/Lymphatic: Moderate aortoiliac atherosclerotic calcification. No aortic aneurysm. No pathologic adenopathy within the abdomen and pelvis. Reproductive: Prostate is unremarkable. Other: Stable subcutaneous edema within the flanks bilaterally in keeping with mild anasarca. Musculoskeletal: Left total hip arthroplasty has been performed. Degenerative changes are seen within the lumbar spine. No acute bone abnormality. IMPRESSION: 1. Interval development of moderate free intraperitoneal gas, likely postsurgical in nature. 2. Interval development of a large loculated gas collection within the right lateral abdomen extending into the deep pelvis approaching the [facility] drain. This measures at least 5.3 x 9.9 x 21 cm in greatest dimension. No significant associated fluid component is identified with this collection. 3. Small loculated gas and fluid collection within the deep pelvis measuring 6.1 cm in greatest dimension. 4. Interval development of ground-glass pulmonary infiltrate within the lingula and right middle lobe, likely infectious or inflammatory in nature. 5. Stable small left pleural effusion with associated left lower lobe collapse and consolidation. 6.  Morphologic changes in keeping with cirrhosis. Previously noted intrahepatic mass is not well appreciated on this noncontrast examination. 7. Stable indeterminate bilateral adrenal nodules. Electronically Signed   By: Linwood Dibbles.D.  On: 11/27/2021 03:01   DG CHEST PORT 1 VIEW  Result Date: 11/27/2021 CLINICAL DATA:  Difficulty breathing EXAM: PORTABLE CHEST 1 VIEW COMPARISON:  11/10/2021 FINDINGS: Right-sided PICC is noted in satisfactory position. Cardiac shadow is enlarged but stable. Increased vascular congestion is noted similar to that seen on the prior exam. No focal confluent infiltrate or sizable effusion is noted. Mild left basilar atelectasis is seen. IMPRESSION: Vascular congestion without significant edema. Mild left basilar atelectasis. Electronically Signed   By: Inez Catalina M.D.   On: 11/27/2021 01:50   [place] EKG SITE RITE  Result Date: 11/24/2021 If Site Rite image not attached, placement could not be confirmed due to current cardiac rhythm.  DG C-Arm 1-60 Min-No Report  Result Date: 11/23/2021 Fluoroscopy was utilized by the requesting physician.  No radiographic interpretation.   CT ABDOMEN PELVIS W CONTRAST  Result Date: 11/22/2021 CLINICAL DATA:  Abdominal pain. EXAM: CT ABDOMEN AND PELVIS WITH CONTRAST TECHNIQUE: Multidetector CT imaging of the abdomen and pelvis was performed using the standard protocol following bolus administration of intravenous contrast. RADIATION DOSE REDUCTION: This exam was performed according to the departmental dose-optimization program which includes automated exposure control, adjustment of the mA and/or kV according to patient size and/or use of iterative reconstruction technique. CONTRAST:  120mL OMNIPAQUE IOHEXOL 300 MG/ML  SOLN COMPARISON:  11/18/2021 and earlier FINDINGS: Lower chest: There are bilateral pleural effusions. Bibasilar atelectasis and LEFT LOWER lobe consolidation are similar to prior study. Heart size is normal. Hepatobiliary:  Cirrhotic contour of the liver. A 3.1 centimeter low-attenuation lesion is identified in the posterior segment of the RIGHT hepatic lobe, not seen on prior noncontrast exams. Further evaluation is indicated. Gallbladder is distended.  No radiopaque calculi. Pancreas: Unremarkable. No pancreatic ductal dilatation or surrounding inflammatory changes. Spleen: Normal in size without focal abnormality. Adrenals/Urinary Tract: A 1.1 centimeter nodule in the RIGHT adrenal gland is stable. A 2.5 centimeter nodule in the LEFT adrenal gland is stable. Numerous bilateral renal cysts are present. Largest is in the LEFT kidney, 5.9 centimeters. No hydronephrosis. Ureters are unremarkable. Urinary bladder is distended and otherwise normal. Stomach/Bowel: Stomach and small bowel loops are normal in appearance. Contrast is seen in the colon to the level of the anus. There are numerous colonic diverticula. There is focal wall thickening in the region of the redundant sigmoid colon. There is moderate mesenteric edema. Small amount of mesenteric fluid identified adjacent to the hepatic segment of the colon and in the RIGHT UPPER QUADRANT, without evidence for abscess. The largest fluid collection is 7.3 x 2.9 x 2.0 centimeters. RIGHT LOWER QUADRANT a drain is in place. No significant fluid collections around the drain. There has been almost complete resolution of air-fluid collection between the bladder in the rectum (image 69 of series 2 and 74 of series 6). This collection now measures 6.2 by 2.2 centimeters. Previously, this measured 9.4 x 6.6 centimeters. Stable appearance of small air-fluid collection in the LEFT central pelvis measuring 6.4 x 3.5 centimeters (image 57 of series 2). Significantly smaller oval collection of fluid contains pigtail drain in the LEFT mid abdomen, now measuring 5.5 x 1.7 centimeters and previously 6.3 centimeters. (Image 46 of series 2). Vascular/Lymphatic: There is atherosclerotic calcification of  the abdominal aorta. Small para aortic lymph nodes are likely reactive. Reproductive: Prostate is unremarkable. Other: There is edema of the LATERAL abdominal wall bilaterally, LEFT greater than RIGHT. Musculoskeletal: Degenerative changes in the LOWER lumbar spine. Prior LEFT hip arthroplasty. IMPRESSION: 1. Improving multiple  fluid collections within the abdomen and pelvis. 2. No significant fluid around the RIGHT LOWER lobe drain. 3. Small fluid around the LEFT LOWER lobe drain, with significant improvement in this collection. 4. Near complete resolution of air fluid collection between the rectum and the bladder. 5. Stable small collection in the central pelvis. 6. Diverticulosis. 7. Indeterminate low-attenuation lesion in the RIGHT hepatic lobe measuring 3.1 centimeters. This has not been fully characterized. In the setting of cirrhosis, further characterization is needed. Recommend non emergent outpatient MRI of the liver. 8.  Aortic atherosclerosis.  (ICD10-I70.0) 9. Abdominal wall edema. 10. Bilateral pleural effusions, bibasilar atelectasis, and LEFT LOWER lobe consolidation are similar. 11. Stable bilateral adrenal nodules. Electronically Signed   By: Nolon Nations M.D.   On: 11/22/2021 12:02   CT IMAGE GUIDED DRAINAGE BY PERCUTANEOUS CATHETER  Result Date: 11/18/2021 INDICATION: 66 year old with intra-abdominal abscesses, suspect diverticular origin but etiology is unclear. Previous right transgluteal drain was removed. Re-accumulation of an air-fluid collection in the pelvis along with additional collections throughout the abdomen. EXAM: 1. CT-guided placement of a drain in the right lower abdominal abscess 2. CT-guided placement of a drain in left lateral abdominal abscess MEDICATIONS: Moderate sedation ANESTHESIA/SEDATION: Moderate (conscious) sedation was employed during this procedure. A total of Versed 4.37m and fentanyl 150 mcg was administered intravenously at the order of the provider  performing the procedure. Total intra-service moderate sedation time: 66 minutes. Patient's level of consciousness and vital signs were monitored continuously by radiology nurse throughout the procedure under the supervision of the provider performing the procedure. COMPLICATIONS: None immediate. PROCEDURE: Informed written consent was obtained from the patient after a thorough discussion of the procedural risks, benefits and alternatives. All questions were addressed. Maximal Sterile Barrier Technique was utilized including caps, mask, sterile gowns, sterile gloves, sterile drape, hand hygiene and skin antiseptic. A timeout was performed prior to the initiation of the procedure. Patient was placed in left lateral decubitus position. Images through the abdomen and pelvis were obtained. The large air-fluid collection the right lower quadrant of the abdomen was targeted. The right side of the abdomen was prepped with chlorhexidine and sterile field was created. Skin was anesthetized with 1% lidocaine. Small incision was made. Using CT guidance, 18 gauge trocar needle was directed into the collection and superstiff Amplatz wire was advanced into the collection. The tract was dilated to accommodate 12 FPakistandrain. Approximately 80 mL of brown purulent fluid was removed from this collection and follow up CT images were obtained. This drain appeared to be decompressing not only the right lower quadrant abscess but also the pelvic abscess. Therefore, another transgluteal drain was not placed. The catheter was sutured to skin and attached to a suction bulb. Fluid was collected for culture. The patient was rolled into the onto his back and images were obtained in the supine position. The fluid collection along the lateral left abdomen was targeted. The left side of the abdomen was prepped chlorhexidine and sterile field was created. Maximal barrier sterile technique was utilized including caps, mask, sterile gowns, sterile  gloves, sterile drape, hand hygiene and skin antiseptic. Skin was anesthetized with 1% lidocaine. Small incision was made. Using CT guidance, an 18 gauge trocar needle was directed into the collection and cloudy yellow fluid was aspirated. Superstiff Amplatz wire was advanced and the tract was dilated to accommodate a 10 [place]. Approximately 30 mL of cloudy yellow purulent fluid was removed. Follow up CT images were obtained. Drain was sutured to  skin and attached to a suction bulb. Fluid was sent for culture. RADIATION DOSE REDUCTION: This exam was performed according to the departmental dose-optimization program which includes automated exposure control, adjustment of the mA and/or kV according to patient size and/or use of iterative reconstruction technique. FINDINGS: 89 French drain placed in the large gas-filled collection in the right lower quadrant. 80 mL of purulent fluid was removed from this drain. 4 French drain was decompressing the right lower quadrant abscess as well as the pelvic abscess. 10 French drain placed in left lateral abdominal abscess. 30 mL of cloudy yellow fluid was removed from this drain. Enlargement of the right piriformis muscle compatible with a hematoma formation from the previous transgluteal drain. IMPRESSION: 1. CT-guided placement of abscess drain in the right lower quadrant abscess. This drain appears to be decompressing the right lower quadrant abscess and the pelvic abscess. 2. CT-guided placement of a drainage catheter in the left lateral abdominal abscess. Electronically Signed   By: Markus Daft M.D.   On: 11/18/2021 17:37   CT IMAGE GUIDED DRAINAGE BY PERCUTANEOUS CATHETER  Result Date: 11/18/2021 INDICATION: 66 year old with intra-abdominal abscesses, suspect diverticular origin but etiology is unclear. Previous right transgluteal drain was removed. Re-accumulation of an air-fluid collection in the pelvis along with additional collections throughout  the abdomen. EXAM: 1. CT-guided placement of a drain in the right lower abdominal abscess 2. CT-guided placement of a drain in left lateral abdominal abscess MEDICATIONS: Moderate sedation ANESTHESIA/SEDATION: Moderate (conscious) sedation was employed during this procedure. A total of Versed 4.25m and fentanyl 150 mcg was administered intravenously at the order of the provider performing the procedure. Total intra-service moderate sedation time: 66 minutes. Patient's level of consciousness and vital signs were monitored continuously by radiology nurse throughout the procedure under the supervision of the provider performing the procedure. COMPLICATIONS: None immediate. PROCEDURE: Informed written consent was obtained from the patient after a thorough discussion of the procedural risks, benefits and alternatives. All questions were addressed. Maximal Sterile Barrier Technique was utilized including caps, mask, sterile gowns, sterile gloves, sterile drape, hand hygiene and skin antiseptic. A timeout was performed prior to the initiation of the procedure. Patient was placed in left lateral decubitus position. Images through the abdomen and pelvis were obtained. The large air-fluid collection the right lower quadrant of the abdomen was targeted. The right side of the abdomen was prepped with chlorhexidine and sterile field was created. Skin was anesthetized with 1% lidocaine. Small incision was made. Using CT guidance, 18 gauge trocar needle was directed into the collection and superstiff Amplatz wire was advanced into the collection. The tract was dilated to accommodate 12 FP[place]rain. Approximately 80 mL of brown purulent fluid was removed from this collection and follow up CT images were obtained. This drain appeared to be decompressing not only the right lower quadrant abscess but also the pelvic abscess. Therefore, another transgluteal drain was not placed. The catheter was sutured to skin and attached to a  suction bulb. Fluid was collected for culture. The patient was rolled into the onto his back and images were obtained in the supine position. The fluid collection along the lateral left abdomen was targeted. The left side of the abdomen was prepped chlorhexidine and sterile field was created. Maximal barrier sterile technique was utilized including caps, mask, sterile gowns, sterile gloves, sterile drape, hand hygiene and skin antiseptic. Skin was anesthetized with 1% lidocaine. Small incision was made. Using CT guidance, an 18 gauge trocar needle was directed into  the collection and cloudy yellow fluid was aspirated. Superstiff Amplatz wire was advanced and the tract was dilated to accommodate a 10 [place] multipurpose drain. Approximately 30 mL of cloudy yellow purulent fluid was removed. Follow up CT images were obtained. Drain was sutured to skin and attached to a suction bulb. Fluid was sent for culture. RADIATION DOSE REDUCTION: This exam was performed according to the departmental dose-optimization program which includes automated exposure control, adjustment of the mA and/or kV according to patient size and/or use of iterative reconstruction technique. FINDINGS: 74 French drain placed in the large gas-filled collection in the right lower quadrant. 80 mL of purulent fluid was removed from this drain. 18 French drain was decompressing the right lower quadrant abscess as well as the pelvic abscess. 10 French drain placed in left lateral abdominal abscess. 30 mL of cloudy yellow fluid was removed from this drain. Enlargement of the right piriformis muscle compatible with a hematoma formation from the previous transgluteal drain. IMPRESSION: 1. CT-guided placement of abscess drain in the right lower quadrant abscess. This drain appears to be decompressing the right lower quadrant abscess and the pelvic abscess. 2. CT-guided placement of a drainage catheter in the left lateral abdominal abscess. Electronically  Signed   By: Markus Daft M.D.   On: 11/18/2021 17:37   CT ABDOMEN PELVIS WO CONTRAST  Result Date: 11/17/2021 CLINICAL DATA:  Follow-up complicated diverticular abscesses. Recently patient had pelvic catheter removed. EXAM: CT ABDOMEN AND PELVIS WITHOUT CONTRAST TECHNIQUE: Multidetector CT imaging of the abdomen and pelvis was performed following the standard protocol without IV contrast. RADIATION DOSE REDUCTION: This exam was performed according to the departmental dose-optimization program which includes automated exposure control, adjustment of the mA and/or kV according to patient size and/or use of iterative reconstruction technique. COMPARISON:  Prior CT scan 11/12/2021 FINDINGS: Lower chest: Persistent small pleural effusions and bibasilar atelectasis. Hepatobiliary: Stable cirrhotic changes involving liver no hepatic lesions are identified. Evidence of portal venous hypertension and portal venous collaterals. Layering high attenuation material in the gallbladder is likely sludge. No findings suspicious for acute cholecystitis. No common bile duct dilatation. Pancreas: No mass, inflammation or ductal dilatation. Spleen: Stable splenomegaly. Adrenals/Urinary Tract: Stable bilateral adrenal gland adenomas. Stable simple and hemorrhagic renal cysts. The bladder is unremarkable. Stomach/Bowel: The stomach duodenum, small and colon are grossly normal. There is contrast in the distal ileum and also throughout the colon. I do not see any leaking contrast. Vascular/Lymphatic: Stable vascular calcifications and scattered mesenteric and retroperitoneal lymph nodes. Reproductive: The prostate gland is mildly enlarged but stable. Other: There is a recurrent abscess in the central low pelvis between the bladder and rectum. It measures approximately 9.4 x 6.6 cm. There is a loculated fluid collection in the left pericolic gutter region lateral to the descending colon which is slightly larger when compared to the prior  CT scan. It does not contain any gas. It measures approximately 7.8 x 3.2 cm on coronal image number 60. Difficult to exclude other central pelvic abscesses versus dilated small bowel loops with air-fluid levels. No free air or leaking oral contrast is demonstrated. Musculoskeletal: No significant bony findings. IMPRESSION: 1. Recurrent central low pelvic abscess measuring approximately 9.4 x 6.6 cm. 2. Slightly larger loculated fluid collection in the left paracolic gutter region lateral to the descending colon. Difficult to exclude other central pelvic abscesses versus dilated small bowel loops with air-fluid levels. 3. No free air or leaking oral contrast is demonstrated. 4. Stable cirrhotic changes involving liver  with portal venous hypertension, portal venous collaterals and splenomegaly. 5. Stable bilateral adrenal gland adenomas. 6. Stable simple and hemorrhagic renal cysts. 7. Persistent small pleural effusions and bibasilar atelectasis. These results will be called to the ordering clinician or representative by the Radiologist Assistant, and communication documented in the PACS or [employer]. Electronically Signed   By: Marijo Sanes M.D.   On: 11/17/2021 15:26   CT ABDOMEN PELVIS WO CONTRAST  Result Date: 11/12/2021 CLINICAL DATA:  Inpatient. Septic shock. Multiple intra-abdominal abscesses, possible perforated diverticulitis. Percutaneous CT-guided transgluteal pelvic drainage catheter placed 4 days prior. EXAM: CT ABDOMEN AND PELVIS WITHOUT CONTRAST TECHNIQUE: Multidetector CT imaging of the abdomen and pelvis was performed following the standard protocol without IV contrast. RADIATION DOSE REDUCTION: This exam was performed according to the departmental dose-optimization program which includes automated exposure control, adjustment of the mA and/or kV according to patient size and/or use of iterative reconstruction technique. COMPARISON:  11/07/2021 CT abdomen/pelvis. FINDINGS: Lower chest: New  complete left lower lobe atelectasis and increased moderate lingular atelectasis. Mild-to-moderate patchy ground-glass opacity throughout right middle lobe. Coronary atherosclerosis. Hepatobiliary: Diffuse hepatic steatosis. Diffusely irregular liver surface with relative hypertrophy of the lateral segment left liver lobe, compatible with cirrhosis. No liver masses. Normal gallbladder with no radiopaque cholelithiasis. No biliary ductal dilatation. Pancreas: Normal, with no mass or duct dilation. Spleen: Normal size. No mass. Adrenals/Urinary Tract: Left adrenal 2.5 cm nodule with density 29 HU, stable size. Right adrenal 1.5 cm nodule with density 21 HU, stable size. No hydronephrosis. No renal stones. Multiple simple bilateral renal cysts, largest 5.7 cm in anterior upper left kidney and subcentimeter hyperdense left renal cortical foci that are too small to characterize, unchanged, for which no follow-up is recommended. Small amount of gas in the non dependent bladder, which is mildly distended and otherwise normal. Stomach/Bowel: Enteric tube terminates in the body of the stomach. Stomach is nondistended and otherwise normal. Normal caliber small bowel with no small bowel wall thickening. Appendix appears normal. Oral contrast transits to the rectum. Scattered diverticulosis throughout the colon, most prominent in the sigmoid colon. Mild wall thickening and pericolonic fat stranding in the proximal sigmoid colon, similar. Vascular/Lymphatic: Atherosclerotic nonaneurysmal abdominal aorta. No pathologically enlarged lymph nodes in the abdomen or pelvis. Reproductive: Mild prostatomegaly. Other: Right transgluteal percutaneous pigtail drain terminates between the rectum and bladder to the left of midline, with no residual fluid collection in this location. Gas containing 6.4 x 4.8 x 5.0 cm (volume = 80 cm^3) right lower quadrant collection (series 2/image 60), previously 7.0 x 3.5 x 3.9 cm (volume = 50 cm^3) using  similar measurement technique, overall mildly increased. Irregular gas containing left lower quadrant mesenteric 8.0 x 5.7 cm collection (series 2/image 60), increased from 5.9 x 4.0 cm. Multiple new interloop fluid collections in the left greater than right small bowel mesentery, largest 5.8 x 3.5 cm in the left small bowel mesentery (series 2/image 45). Musculoskeletal: No aggressive appearing focal osseous lesions. Left total hip arthroplasty. Marked thoracolumbar spondylosis. IMPRESSION: 1. Multifocal intraperitoneal abscesses with mixed interval changes. 2. Right transgluteal percutaneous pigtail drain terminates between the rectum and bladder to the left of midline, with no residual fluid collection in this location. 3. Gas-containing right lower quadrant collection is mildly increased. Irregular gas-containing left lower quadrant mesenteric collection, increased. Multiple new interloop fluid collections in the small bowel mesentery. 4. New complete left lower lobe atelectasis and increased moderate lingular atelectasis. Mild-to-moderate patchy ground-glass opacity throughout the right middle lobe,  nonspecific, probably inflammatory. 5. Cirrhosis. No liver masses. 6. Stable bilateral adrenal nodules with indeterminate density. Follow-up CT abdomen without and with IV contrast suggested in 12 months. 7. Small amount of gas in the non dependent bladder, nonspecific, correlate for history of recent bladder instrumentation. 8. Aortic Atherosclerosis (ICD10-I70.0). Electronically Signed   By: Ilona Sorrel M.D.   On: 11/12/2021 11:50   DG Abd 1 View  Result Date: 11/11/2021 CLINICAL DATA:  Nasogastric tube placement EXAM: ABDOMEN - 1 VIEW COMPARISON:  11/09/2021 FINDINGS: A feeding tube is present with tip in the stomach body oriented towards the antrum. Retrocardiac airspace opacity in the lung bases. A pigtail catheter projects over the pelvis. The top of a left total hip prosthesis is partially visualized.  Lower thoracic and lumbar spondylosis noted. IMPRESSION: 1. Feeding tube tip: Stomach body, oriented towards the antrum. 2. Retrocardiac airspace opacity on the left. Electronically Signed   By: Van Clines M.D.   On: 11/11/2021 08:48   DG CHEST PORT 1 VIEW  Result Date: 11/10/2021 CLINICAL DATA:  Hypoxia EXAM: PORTABLE CHEST 1 VIEW COMPARISON:  November 09, 2021 FINDINGS: The heart size and mediastinal contours are stable. Feeding tube is identified unchanged. Increased airspace opacities identified in both lungs, worsened compared prior exam. Patchy consolidation of bilateral lung bases are noted. The visualized skeletal structures are stable. IMPRESSION: Increased airspace opacities identified in both lungs, worsened compared prior exam. Patchy consolidation of bilateral lung bases. Electronically Signed   By: Abelardo Diesel M.D.   On: 11/10/2021 07:25   ECHOCARDIOGRAM COMPLETE  Result Date: 11/09/2021    ECHOCARDIOGRAM REPORT   Patient Name:   MISHAWN HEMANN Date of Exam: 11/09/2021 Medical Rec #:  [id number]       Height:       66.9 in Accession #:    [id number]      Weight:       274.0 lb Date of Birth:  [birth year]      BSA:          2.310 m Patient Age:    42 years        BP:           131/58 mmHg Patient Gender: M               HR:           71 bpm. Exam Location:  Inpatient Procedure: 2D Echo, Cardiac Doppler, Color Doppler and Intracardiac            Opacification Agent Indications:    CHF  History:        Patient has no prior history of Echocardiogram examinations.  Sonographer:    Joette Catching RCS Referring Phys: [id number] Anna  1. Left ventricular ejection fraction, by estimation, is 60 to 65%. The left ventricle has normal function. The left ventricle has no regional wall motion abnormalities. There is mild concentric left ventricular hypertrophy. Left ventricular diastolic parameters were normal.  2. Right ventricular systolic function is normal. The right ventricular  size is mildly enlarged. Tricuspid regurgitation signal is inadequate for assessing PA pressure.  3. The mitral valve is grossly normal. Trivial mitral valve regurgitation. No evidence of mitral stenosis.  4. The aortic valve is tricuspid. There is mild calcification of the aortic valve. Aortic valve regurgitation is not visualized. Aortic valve sclerosis/calcification is present, without any evidence of aortic stenosis.  5. There is mild dilatation of the ascending aorta, measuring 42 mm.  6.  The inferior vena cava is dilated in size with >50% respiratory variability, suggesting right atrial pressure of 8 mmHg. FINDINGS  Left Ventricle: Left ventricular ejection fraction, by estimation, is 60 to 65%. The left ventricle has normal function. The left ventricle has no regional wall motion abnormalities. Definity contrast agent was given IV to delineate the left ventricular  endocardial borders. The left ventricular internal cavity size was normal in size. There is mild concentric left ventricular hypertrophy. Left ventricular diastolic parameters were normal. Right Ventricle: The right ventricular size is mildly enlarged. No increase in right ventricular wall thickness. Right ventricular systolic function is normal. Tricuspid regurgitation signal is inadequate for assessing PA pressure. Left Atrium: Left atrial size was normal in size. Right Atrium: Right atrial size was normal in size. Pericardium: Trivial pericardial effusion is present. Mitral Valve: The mitral valve is grossly normal. Trivial mitral valve regurgitation. No evidence of mitral valve stenosis. Tricuspid Valve: The tricuspid valve is grossly normal. Tricuspid valve regurgitation is trivial. No evidence of tricuspid stenosis. Aortic Valve: The aortic valve is tricuspid. There is mild calcification of the aortic valve. Aortic valve regurgitation is not visualized. Aortic valve sclerosis/calcification is present, without any evidence of aortic stenosis.  Aortic valve mean gradient measures 10.0 mmHg. Aortic valve peak gradient measures 20.2 mmHg. Aortic valve area, by VTI measures 4.20 cm. Pulmonic Valve: The pulmonic valve was grossly normal. Pulmonic valve regurgitation is not visualized. No evidence of pulmonic stenosis. Aorta: The aortic root is normal in size and structure. There is mild dilatation of the ascending aorta, measuring 42 mm. Venous: The inferior vena cava is dilated in size with greater than 50% respiratory variability, suggesting right atrial pressure of 8 mmHg. IAS/Shunts: The atrial septum is grossly normal.  LEFT VENTRICLE PLAX 2D LVIDd:         4.50 cm   Diastology LVIDs:         3.30 cm   LV e' medial:    7.83 cm/s LV PW:         1.20 cm   LV E/e' medial:  10.0 LV IVS:        1.40 cm   LV e' lateral:   12.20 cm/s LVOT diam:     2.30 cm   LV E/e' lateral: 6.4 LV SV:         160 LV SV Index:   69 LVOT Area:     4.15 cm  RIGHT VENTRICLE             IVC RV Basal diam:  4.70 cm     IVC diam: 2.30 cm RV Mid diam:    4.20 cm RV S prime:     18.60 cm/s TAPSE (M-mode): 2.8 cm LEFT ATRIUM           Index        RIGHT ATRIUM           Index LA diam:      3.40 cm 1.47 cm/m   RA Area:     19.10 cm LA Vol (A2C): 54.2 ml 23.46 ml/m  RA Volume:   57.00 ml  24.68 ml/m LA Vol (A4C): 48.1 ml 20.82 ml/m  AORTIC VALVE                     PULMONIC VALVE AV Area (Vmax):    3.59 cm      PV Vmax:       1.38 m/s AV Area (Vmean):  3.69 cm      PV Peak grad:  7.6 mmHg AV Area (VTI):     4.20 cm AV Vmax:           224.50 cm/s AV Vmean:          148.500 cm/s AV VTI:            0.381 m AV Peak Grad:      20.2 mmHg AV Mean Grad:      10.0 mmHg LVOT Vmax:         194.00 cm/s LVOT Vmean:        132.000 cm/s LVOT VTI:          0.385 m LVOT/AV VTI ratio: 1.01  AORTA Ao Root diam: 3.80 cm Ao Asc diam:  4.20 cm MITRAL VALVE MV Area (PHT): 3.27 cm    SHUNTS MV Decel Time: 232 msec    Systemic VTI:  0.38 m MV E velocity: 78.30 cm/s  Systemic Diam: 2.30 cm MV A  velocity: 88.10 cm/s MV E/A ratio:  0.89 Eleonore Chiquito MD Electronically signed by Eleonore Chiquito MD Signature Date/Time: 11/09/2021/12:26:45 PM    Final    DG Chest Port 1 View  Result Date: 11/09/2021 CLINICAL DATA:  Hypoxia. EXAM: PORTABLE CHEST 1 VIEW COMPARISON:  November 08, 2021 FINDINGS: There is stable left internal jugular venous catheter positioning. Interval enteric tube placement is seen without visualization of its distal tip. The heart size and mediastinal contours are within normal limits. Mild diffusely increased interstitial lung markings are seen with mild perihilar prominence of the pulmonary vasculature. Mild bibasilar atelectasis is also noted. There is no evidence of a pleural effusion or pneumothorax. Multilevel degenerative changes are seen throughout the thoracic spine. IMPRESSION: 1. Mild pulmonary vascular congestion with increased interstitial lung markings, likely consistent with interstitial edema. 2. Mild bibasilar atelectasis. Electronically Signed   By: Virgina Norfolk M.D.   On: 11/09/2021 02:25   DG Abd 1 View  Result Date: 11/09/2021 CLINICAL DATA:  Nasogastric tube placement. EXAM: ABDOMEN - 1 VIEW COMPARISON:  November 08, 2021 FINDINGS: A feeding tube is present with its radiopaque weighted tip overlying the region of the gastric antrum. This represents a new finding. Nonspecific air-filled loops of bowel are seen within the left lower quadrant. No radio-opaque calculi or other significant radiographic abnormality are identified. A percutaneous surgical drain is seen with its distal tip overlying the midline of the pelvis. An intact left hip replacement is noted. IMPRESSION: Interval feeding tube placement and positioning, as described above. Electronically Signed   By: Virgina Norfolk M.D.   On: 11/09/2021 01:11   CT IMAGE GUIDED DRAINAGE BY PERCUTANEOUS CATHETER  Result Date: 11/08/2021 INDICATION: Intra-abdominal and pelvic abscess.  Sepsis. EXAM: CT GUIDED RIGHT  TRANSGLUTEAL APPROACH ABSCESS DRAINAGE CATHETER PLACEMENT RADIATION DOSE REDUCTION: This exam was performed according to the departmental dose-optimization program which includes automated exposure control, adjustment of the mA and/or kV according to patient size and/or use of iterative reconstruction technique. COMPARISON:  None Available. MEDICATIONS: The patient is currently admitted to the hospital and receiving intravenous antibiotics. The antibiotics were administered within an appropriate time frame prior to the initiation of the procedure. ANESTHESIA/SEDATION: Moderate (conscious) sedation was employed during this procedure. A total of Versed 1 mg and Fentanyl 100 mcg was administered intravenously. Moderate Sedation Time: 27 minutes. The patient's level of consciousness and vital signs were monitored continuously by radiology nursing throughout the procedure under my direct supervision. CONTRAST:  None COMPLICATIONS:  None immediate. PROCEDURE: Informed written consent was obtained from the patient and/or patient's representative after a discussion of the risks, benefits and alternatives to treatment. The patient was placed prone on the CT gantry and a pre procedural CT was performed re-demonstrating the known abscess/fluid collection within the deep pelvis. The procedure was planned. A timeout was performed prior to the initiation of the procedure. The RIGHT gluteal soft tissue was prepped and draped in the usual sterile fashion. The overlying soft tissues were anesthetized with 1% lidocaine with epinephrine. Appropriate trajectory was planned with the use of a 22 gauge spinal needle. An 18 gauge trocar needle was advanced into the abscess/fluid collection and a short Amplatz super stiff wire was coiled within the collection. Appropriate positioning was confirmed with a limited CT scan. The tract was serially dilated allowing placement of a 12 Fr drainage catheter. Appropriate positioning was confirmed with  a limited postprocedural CT scan. Feculent fluid was aspirated. The tube was connected to a drainage bag and sutured in place. A dressing was placed. The patient tolerated the procedure well without immediate post procedural complication. IMPRESSION: Successful CT guided placement of a 12 Fr RIGHT transgluteal approach pelvis drainage catheter, as above. Samples were sent to the laboratory as requested by the ordering clinical team. Michaelle Birks, MD [facility] Electronically Signed   By: Michaelle Birks M.D.   On: 11/08/2021 17:30   DG CHEST PORT 1 VIEW  Result Date: 11/08/2021 CLINICAL DATA:  Encounter for central line placement. EXAM: PORTABLE CHEST 1 VIEW COMPARISON:  AP chest 11/08/2021 at 6:40 a.m.; chest two views 08/28/2007 FINDINGS: AP chest 11/08/2021 at 8:35 a.m. New left internal jugular central venous catheter with tip overlying the mid to central aspect of the superior vena cava, in appropriate position. Cardiac silhouette is again within normal size limits. Mediastinal contours are within normal limits. Mild bilateral lower lung interstitial thickening is unchanged. No definite pleural effusion. No pneumothorax. No acute skeletal abnormality. IMPRESSION: 1. New left internal jugular central venous catheter tip in appropriate position. 2. Likely mild interstitial pulmonary edema. Electronically Signed   By: Yvonne Kendall M.D.   On: 11/08/2021 08:45   DG CHEST PORT 1 VIEW  Result Date: 11/08/2021 CLINICAL DATA:  Hypoxia, labored breathing. EXAM: PORTABLE CHEST 1 VIEW COMPARISON:  August 28, 2007 FINDINGS: The heart size and mediastinal contours are stable. The heart size enlarged. Mild increased pulmonary interstitium is identified bilaterally. Mild patchy consolidation of bilateral lung bases are noted. The visualized skeletal structures are stable. IMPRESSION: 1. Mild congestive heart failure. 2. Mild patchy consolidation of bilateral lung  bases, pneumonia is not excluded. Electronically Signed   By: Abelardo Diesel M.D.   On: 11/08/2021 06:53   DG Abd 1 View  Result Date: 11/08/2021 CLINICAL DATA:  Intra-abdominal abscess and diarrhea. EXAM: ABDOMEN - 1 VIEW COMPARISON:  CT yesterday. FINDINGS: Supine view of the central abdomen which excludes both flanks. Air filled structure in the midline pelvis likely corresponds to abscess on CT. The additional fluid collections on CT are not well seen by radiograph. Soft tissue attenuation from habitus limits assessment. IMPRESSION: Air filled structure in the midline pelvis likely corresponds to abscess with air-fluid level on yesterday's CT. The additional fluid collections on CT are not well seen by radiograph. Electronically Signed   By: Keith Rake M.D.   On: 11/08/2021 02:00   CT Abdomen Pelvis Wo Contrast  Addendum Date: 11/07/2021   ADDENDUM REPORT: 11/07/2021 14:20  ADDENDUM: Following should be added to the impression. There are nodules in both adrenals with density measurements of less than 10 Hounsfield units suggesting possible incidental adenomas. Electronically Signed   By: Elmer Picker M.D.   On: 11/07/2021 14:20   Result Date: 11/07/2021 CLINICAL DATA:  Pain left lower quadrant of abdomen EXAM: CT ABDOMEN AND PELVIS WITHOUT CONTRAST TECHNIQUE: Multidetector CT imaging of the abdomen and pelvis was performed following the standard protocol without IV contrast. RADIATION DOSE REDUCTION: This exam was performed according to the departmental dose-optimization program which includes automated exposure control, adjustment of the mA and/or kV according to patient size and/or use of iterative reconstruction technique. COMPARISON:  None Available. FINDINGS: Lower chest: Small linear densities in the lower lung fields may suggest scarring or subsegmental atelectasis. Hepatobiliary: Liver measures 18 cm in length. There is mild nodularity in the liver surface. No focal abnormality is seen.  There is no dilation of bile ducts. There is subtle increased density in the dependent portion of gallbladder suggesting sludge and possibly stones. There is no fluid around the gallbladder. Pancreas: Unremarkable. Spleen: Spleen measures 13.6 cm in maximum diameter. Adrenals/Urinary Tract: There is 2.6 cm nodule in the left adrenal. There is 2 cm nodule in the right adrenal. Density measurements are less than 10 Hounsfield units. There is no hydronephrosis. There are few smooth marginated low-density lesions largest measuring 6.2 cm in size in the anterior midportion of left kidney. There are 2 small high density nodules in the left adrenal. There are no renal or ureteral stones. Urinary bladder is not distended. Beam hardening artifacts are partly obscuring portions of the bladder. Stomach/Bowel: Stomach is unremarkable. Small bowel loops are not dilated. Appendix is not visualized. Scattered diverticula are seen in colon. There is stranding in the mesenteric fat in the lower abdomen. There is 11.5 x 6 x 11.7 cm loculated fluid collection with air-fluid level in the pelvis. There is another loculated fluid collection in the right lower quadrant measuring 7 x 3.8 x 4.1 cm with air-fluid level. There is 3.9 x 2 cm fluid collection in the lower abdomen slightly more to the left of midline. Inflammatory stranding is slightly more in the small bowel mesentery rather than sigmoid colon. Vascular/Lymphatic: There are scattered arterial calcifications. Reproductive: Evaluation of prostate is limited by beam hardening artifacts from orthopedic hardware. Other: There is no ascites or pneumoperitoneum. Musculoskeletal: Degenerative changes are noted in the lumbar spine with spinal stenosis and encroachment of neural foramina at multiple levels. There is previous left hip arthroplasty. IMPRESSION: There is no evidence of intestinal obstruction or pneumoperitoneum. There is no hydronephrosis. There are multiple loculated fluid  collections with air-fluid levels in the lower abdomen and pelvis. Largest of these collections is in the pelvic cavity measuring 11.5 x 6 x 11.7 cm. There are other smaller fluid collections in the right lower quadrant and lower abdomen slightly more to the left of midline. Findings suggest possible multiple abscesses. Inflammatory changes are more pronounced in the small bowel mesentery. Diverticulosis of colon without significant wall thickening or focal pericolic stranding. Findings suggest possible diverticulitis with multiple abscesses or inflammatory or infectious process in the distal small bowel loops with multiple abscesses. Repeat CT with intravenous and oral contrast may be considered for further characterization. Increased density in the lumen of the gallbladder may suggest presence of sludge and possibly tiny stones. Bilateral renal cysts. There 2 small foci of high density in the left kidney, possibly hyperdense cysts. Lumbar spondylosis with spinal  stenosis and encroachment of neural foramina at multiple levels. Other findings as described in the body of the report. Electronically Signed: By: Elmer Picker M.D. On: 11/07/2021 13:56    Microbiology: Results for orders placed or performed during the hospital encounter of 11/07/21  Culture, blood (routine x 2)     Status: None   Collection Time: 11/07/21  3:10 PM   Specimen: BLOOD RIGHT FOREARM  Result Value Ref Range Status   Specimen Description   Final    BLOOD RIGHT FOREARM Performed at [facility], [address], [place], [place] [address]    Special Requests   Final    BOTTLES DRAWN AEROBIC AND ANAEROBIC Blood Culture adequate volume Performed at [facility], [address] [place], [place] [address]    Culture   Final    NO GROWTH 5 DAYS Performed at [facility] [address] [place], [place] [address]    Report Status 11/12/2021 FINAL  Final  Culture, blood (routine  x 2)     Status: None   Collection Time: 11/07/21  3:12 PM   Specimen: BLOOD LEFT FOREARM  Result Value Ref Range Status   Specimen Description   Final    BLOOD LEFT FOREARM Performed at [facility], [address] [place], [place] [address]    Special Requests   Final    BOTTLES DRAWN AEROBIC AND ANAEROBIC Blood Culture adequate volume Performed at [facility], [address] [place], [place] [address]    Culture   Final    NO GROWTH 5 DAYS Performed at [facility], [place] [address] [place], [place] [address]    Report Status 11/12/2021 FINAL  Final  Surgical PCR screen     Status: None   Collection Time: 11/08/21  2:06 AM   Specimen: Nasal Mucosa; Nasal Swab  Result Value Ref Range Status   MRSA, PCR NEGATIVE NEGATIVE Final   Staphylococcus aureus NEGATIVE NEGATIVE Final    Comment: (NOTE) The Xpert SA Assay (FDA approved for NASAL specimens in patients 85 years of age and older), is one component of a comprehensive surveillance program. It is not intended to diagnose infection nor to guide or monitor treatment. Performed at [facility], [place] [address], [place], [place] [address]   Aerobic/Anaerobic Culture w Gram Stain (surgical/deep wound)     Status: None   Collection Time: 11/08/21  1:25 PM   Specimen: Abscess  Result Value Ref Range Status   Specimen Description   Final    ABSCESS DEEP PELVIC DRAIN Performed at [facility] [address], [place], [place] [address]    Special Requests   Final    NONE Performed at [facility], [place] [address] [place], [place] [address]    Gram Stain   Final    RARE WBC PRESENT,BOTH PMN AND MONONUCLEAR ABUNDANT GRAM POSITIVE COCCI IN PAIRS MODERATE GRAM NEGATIVE RODS RARE GRAM POSITIVE RODS Performed at [facility], [place] [address] [place], [place] [address]    Culture   Final    MODERATE ESCHERICHIA COLI MIXED  ANAEROBIC FLORA PRESENT.  CALL LAB IF FURTHER IID REQUIRED.    Report Status 11/11/2021 FINAL  Final   Organism ID, Bacteria ESCHERICHIA COLI  Final      Susceptibility   Escherichia coli - MIC*    AMPICILLIN 4 SENSITIVE Sensitive     CEFAZOLIN <=4 SENSITIVE Sensitive  CEFEPIME <=0.12 SENSITIVE Sensitive     CEFTAZIDIME <=1 SENSITIVE Sensitive     CEFTRIAXONE <=0.25 SENSITIVE Sensitive     CIPROFLOXACIN <=0.25 SENSITIVE Sensitive     GENTAMICIN <=1 SENSITIVE Sensitive     IMIPENEM <=0.25 SENSITIVE Sensitive     TRIMETH/SULFA <=20 SENSITIVE Sensitive     AMPICILLIN/SULBACTAM <=2 SENSITIVE Sensitive     PIP/TAZO <=4 SENSITIVE Sensitive     * MODERATE ESCHERICHIA COLI  Aerobic/Anaerobic Culture w Gram Stain (surgical/deep wound)     Status: None   Collection Time: 11/18/21  4:08 PM   Specimen: Abscess  Result Value Ref Range Status   Specimen Description ABSCESS ABDOMEN  Final   Special Requests LEFT LATERAL ABDOMEN DRAIN NO 2  Final   Gram Stain   Final    NO SQUAMOUS EPITHELIAL CELLS SEEN ABUNDANT WBC SEEN NO ORGANISMS SEEN    Culture   Final    RARE CANDIDA TROPICALIS NO ANAEROBES ISOLATED SEE SEPARATE REPORT Performed at [facility], [address], [place], [place] [address]    Report Status 11/29/2021 FINAL  Final  Aerobic/Anaerobic Culture w Gram Stain (surgical/deep wound)     Status: None   Collection Time: 11/18/21  4:08 PM   Specimen: Abscess  Result Value Ref Range Status   Specimen Description ABSCESS ABDOMEN  Final   Special Requests RT LOWER QUAD DRAIN NO 1  Final   Gram Stain   Final    NO SQUAMOUS EPITHELIAL CELLS SEEN MODERATE WBC SEEN NO ORGANISMS SEEN    Culture   Final    No growth aerobically or anaerobically. Performed at [facility], [place] [address], [place],  [address]    Report Status 11/23/2021 FINAL  Final  Antifungal AST 9 Drug Panel     Status: None   Collection Time: 11/18/21  4:08 PM  Result Value Ref Range  Status   Organism ID, Yeast Candida tropicalis  Corrected    Comment: (NOTE) Identification performed by account, not confirmed by this laboratory. CORRECTED ON 06/24 AT 1436: PREVIOUSLY REPORTED AS Preliminary report    Amphotericin B MIC 0.5 ug/mL  Final    Comment: (NOTE) Breakpoints have been established for only some organism-drug combinations as indicated. This test was developed and its performance characteristics determined by [facility]. It has not been cleared or approved by the Food and Drug Administration.    Anidulafungin MIC Comment  Final    Comment: (NOTE) 0.25 ug/mL Susceptible Breakpoints have been established for only some organism-drug combinations as indicated. This test was developed and its performance characteristics determined by [facility]. It has not been cleared or approved by the Food and Drug Administration.    Caspofungin MIC Comment  Final    Comment: (NOTE) 0.25 ug/mL Susceptible Breakpoints have been established for only some organism-drug combinations as indicated. This test was developed and its performance characteristics determined by [facility]. It has not been cleared or approved by the Food and Drug Administration.    Micafungin MIC Comment  Final    Comment: (NOTE) 0.06 ug/mL Susceptible Breakpoints have been established for only some organism-drug combinations as indicated. This test was developed and its performance characteristics determined by [facility]. It has not been cleared or approved by the Food and Drug Administration.    Posaconazole MIC 1.0 ug/mL  Final    Comment: (NOTE) Breakpoints have been established for only some organism-drug combinations as indicated. This test was developed and its performance characteristics determined by [facility]. It  has not been cleared or approved by the Food and Drug Administration.    Fluconazole Islt MIC 8.0 ug/mL Resistant  Final    Comment: (NOTE) Breakpoints have been established for  only some organism-drug combinations as indicated. This test was developed and its performance characteristics determined by [facility]. It has not been cleared or approved by the Food and Drug Administration.    Flucytosine MIC 0.12 ug/mL  Final    Comment: (NOTE) Breakpoints have been established for only some organism-drug combinations as indicated. This test was developed and its performance characteristics determined by [facility]. It has not been cleared or approved by the Food and Drug Administration.    Itraconazole MIC 0.5 ug/mL  Final    Comment: (NOTE) Breakpoints have been established for only some organism-drug combinations as indicated. This test was developed and its performance characteristics determined by [facility]. It has not been cleared or approved by the Food and Drug Administration.    Voriconazole MIC 1.0 ug/mL Resistant  Final    Comment: (NOTE) Breakpoints have been established for only some organism-drug combinations as indicated. This test was developed and its performance characteristics determined by [facility]. It has not been cleared or approved by the Food and Drug Administration. Performed At: [facility] [place], [place] [id number] Rush Farmer MD AT:[id number]    Source ABSCESS/ CANDIDA TROPICALIS  Final    Comment: Performed at [facility], [place] [address], [place], [place] [address]  Culture, blood (Routine X 2) w Reflex to ID Panel     Status: None   Collection Time: 11/27/21  2:09 AM   Specimen: BLOOD  Result Value Ref Range Status   Specimen Description   Final    BLOOD LEFT ANTECUBITAL Performed at [facility] [address], [place], [place] [address]    Special Requests   Final    BOTTLES DRAWN AEROBIC AND ANAEROBIC Blood Culture adequate volume Performed at [facility] [address] [place], [place] [address]    Culture   Final    NO GROWTH 5 DAYS Performed at  [facility], [place] [address], [place], [place] [address]    Report Status 12/02/2021 FINAL  Final  Culture, blood (Routine X 2) w Reflex to ID Panel     Status: None   Collection Time: 11/27/21  2:11 AM   Specimen: BLOOD  Result Value Ref Range Status   Specimen Description   Final    BLOOD BLOOD LEFT HAND Performed at [facility] [address], [place], [place] [address]    Special Requests   Final    BOTTLES DRAWN AEROBIC AND ANAEROBIC Blood Culture adequate volume Performed at [facility] [address], [place], [place] [address]    Culture   Final    NO GROWTH 5 DAYS Performed at [facility], [place] [address], [place], [place] [address]    Report Status 12/02/2021 FINAL  Final  Aerobic/Anaerobic Culture w Gram Stain (surgical/deep wound)     Status: None   Collection Time: 11/27/21  1:55 PM   Specimen: Abscess  Result Value Ref Range Status   Specimen Description   Final    ABSCESS Performed at [facility] [address], [place], [place] [address]    Special Requests   Final    Normal Performed at [facility], [place] [address], [place], [place] [address]    Gram  Stain   Final    ABUNDANT WBC PRESENT, PREDOMINANTLY PMN RARE GRAM NEGATIVE RODS    Culture   Final    FEW ESCHERICHIA COLI MODERATE CANDIDA ALBICANS FEW BACTEROIDES THETAIOTAOMICRON BETA LACTAMASE POSITIVE Performed at [facility], [place] [address], [place], [place] [address]    Report Status 12/01/2021 FINAL  Final   Organism ID, Bacteria ESCHERICHIA COLI  Final      Susceptibility   Escherichia coli - MIC*    AMPICILLIN 4 SENSITIVE Sensitive     CEFAZOLIN <=4 SENSITIVE Sensitive     CEFEPIME <=0.12 SENSITIVE Sensitive     CEFTAZIDIME <=1 SENSITIVE Sensitive     CEFTRIAXONE <=0.25 SENSITIVE Sensitive     CIPROFLOXACIN <=0.25 SENSITIVE Sensitive     GENTAMICIN <=1 SENSITIVE Sensitive     IMIPENEM  <=0.25 SENSITIVE Sensitive     TRIMETH/SULFA <=20 SENSITIVE Sensitive     AMPICILLIN/SULBACTAM <=2 SENSITIVE Sensitive     PIP/TAZO <=4 SENSITIVE Sensitive     * FEW ESCHERICHIA COLI    Labs: CBC: Recent Labs  Lab 11/29/21 1503 11/30/21 0342 12/01/21 1130 12/01/21 1326 12/02/21 1131 12/03/21 0340  WBC 8.7 9.5 8.1 9.1 9.9 8.9  NEUTROABS 6.3  --   --   --   --   --   HGB 7.9* 8.3* 7.0* 8.4* 9.1* 8.6*  HCT 25.2* 26.3* 22.5* 26.9* 29.0* 27.7*  MCV 100.8* 100.4* 102.7* 102.3* 102.1* 101.1*  PLT 146* 179 165 198 264 997   Basic Metabolic Panel: Recent Labs  Lab 11/29/21 0535 11/30/21 0342 12/01/21 0436 12/02/21 0341 12/03/21 0340 12/04/21 0335 12/05/21 0340  NA 135 135 136 136 135 136 134*  K 4.2 4.4 4.9 4.8 4.5 4.1 4.0  CL 104 107 107 107 106 107 107  CO2 _0 21*  GLUCOSE 126* 128* 119* 122* 115* 113* 118*  BUN 31* 26* 25* 23 28* 26* 27*  CREATININE 1.13 1.07 0.95 1.12 1.15 1.32* 1.32*  CALCIUM 8.0* 8.4* 8.7* 8.8* 8.7* 8.9 8.7*  MG 2.0  --   --  1.9 1.9 1.9  --   PHOS 2.4* 2.7 3.3 3.6 3.9 3.8  --    Liver Function Tests: Recent Labs  Lab 11/29/21 0535 11/30/21 0342 12/01/21 0436 12/02/21 0341  AST 34 35 29 31  ALT _1 ALKPHOS 33* 38 39 39  BILITOT 0.6 0.7 0.5 0.6  PROT 6.4* 6.7 6.9 7.1  ALBUMIN 1.7* 1.8* 1.8* 1.8*   CBG: Recent Labs  Lab 12/04/21 1133 12/04/21 1745 12/04/21 2326 12/05/21 0554 12/05/21 1136  GLUCAP 125* 105* 134* 117* 133*    Discharge time spent: approximately 35 minutes spent on discharge counseling, evaluation of patient on day of discharge, and coordination of discharge planning with nursing, social work, pharmacy and case management  Signed: Edwin Dada, MD [facility] 12/05/2021

## 2021-12-06 ENCOUNTER — Encounter: Payer: Self-pay | Admitting: *Deleted

## 2021-12-06 LAB — TRIGLYCERIDES: Triglycerides: 157 mg/dL — ABNORMAL HIGH (ref <150)

## 2021-12-06 LAB — COMPREHENSIVE METABOLIC PANEL
ALT: 14 U/L (ref 0–44)
AST: 28 U/L (ref 15–41)
Albumin: 2.1 g/dL — ABNORMAL LOW (ref 3.5–5.0)
Alkaline Phosphatase: 40 U/L (ref 38–126)
Anion gap: 7 (ref 5–15)
BUN: 26 mg/dL — ABNORMAL HIGH (ref 8–23)
CO2: 21 mmol/L — ABNORMAL LOW (ref 22–32)
Calcium: 9 mg/dL (ref 8.9–10.3)
Chloride: 107 mmol/L (ref 98–111)
Creatinine, Ser: 1.32 mg/dL — ABNORMAL HIGH (ref 0.61–1.24)
GFR, Estimated: >60 mL/min (ref >60)
Glucose, Bld: 90 mg/dL (ref 70–99)
Potassium: 4.4 mmol/L (ref 3.5–5.1)
Sodium: 135 mmol/L (ref 135–145)
Total Bilirubin: 0.5 mg/dL (ref 0.3–1.2)
Total Protein: 7.6 g/dL (ref 6.5–8.1)

## 2021-12-06 LAB — GLUCOSE, CAPILLARY
Glucose-Capillary: 100 mg/dL — ABNORMAL HIGH (ref 70–99)
Glucose-Capillary: 108 mg/dL — ABNORMAL HIGH (ref 70–99)
Glucose-Capillary: 115 mg/dL — ABNORMAL HIGH (ref 70–99)
Glucose-Capillary: 94 mg/dL (ref 70–99)

## 2021-12-06 LAB — MAGNESIUM: Magnesium: 1.8 mg/dL (ref 1.7–2.4)

## 2021-12-06 LAB — PHOSPHORUS: Phosphorus: 3.4 mg/dL (ref 2.5–4.6)

## 2021-12-06 MED ORDER — BOOST / RESOURCE BREEZE PO LIQD CUSTOM
1.0000 | Freq: Three times a day (TID) | ORAL | Status: DC
  Administered 2021-12-06 – 2021-12-07 (×3): 1 via ORAL

## 2021-12-06 MED ORDER — SODIUM CHLORIDE 0.9 % IV SOLN
1.0000 g | Freq: Once | INTRAVENOUS | Status: AC
Start: 2021-12-06 — End: 2021-12-06
  Administered 2021-12-06: 1000 mg via INTRAVENOUS
  Filled 2021-12-06: qty 1

## 2021-12-06 MED ORDER — INSULIN ASPART 100 UNIT/ML IJ SOLN: 0.0000 [IU] | Freq: Three times a day (TID) | INTRAMUSCULAR | Status: DC

## 2021-12-06 NOTE — TOC Progression Note (Addendum)
Transition of Care [facility]) - Progression Note    Patient Details  Name: Robert Larson MRN: [id number] Date of Birth: 03/01/1956  Transition of Care [facility]) CM/SW Contact  Joaquin Courts, RN Phone Number: 12/06/2021, 4:35 PM  Clinical Narrative:    CM submitted all documentation for wound vac to [facility], however home health is unable to service patient for wound vac dressing changes.  CM reached out to all area [facility] agencies to attempt to locate another provider, no provider is able to accept.  CM called and spoke with CC at [facility] who reported she will escalate the case, but at this time [facility] has not been able to secure home health provider for RN or PT.  [facility] remains on board for home IV antibiotics and will deliver medication to home this evening with [facility] set to provide nursing services for abx infusion ONLY.    CM met with provider and updated that [facility] for wound care could not be secured.  MD outreached to surgery for alternative wound care recommendations and updated CM that surgeon plans to see patient in AM with likely changes in wound care to wet to dry.  CM called outpatient wound care center for appointment and was informed that after their physician reviewed the case, the wound care center would not be able to provide services to the patient.  Patient remains without [place] wound care options at this time.   Daughter updated of this and reports plans to be here in am.  TOC will continue to follow for updated wound care recommendations and whether or not a vac device is still needed.     Expected Discharge Plan: [place] Barriers to Discharge: Continued Medical Work up  Expected Discharge Plan and Services Expected Discharge Plan: [place]         Expected Discharge Date: 12/05/21                                     Social Determinants of Health (SDOH) Interventions    Readmission Risk Interventions    12/06/2021     4:35 PM  Readmission Risk Prevention Plan  Transportation Screening Complete  Medication Review ([place]) Complete  PCP or Specialist appointment within 3-5 days of discharge Complete  HRI or [place] Complete  SW Recovery Care/Counseling Consult Complete  [place] Not Applicable

## 2021-12-06 NOTE — Progress Notes (Signed)
Pt refused to wear his cpap from home or hospital-provided machine.  Pt was advised that RT is available all night should he change his mind.  RN aware.

## 2021-12-06 NOTE — Progress Notes (Signed)
Physical Therapy Treatment Patient Details Name: Robert Larson MRN: [id number] DOB: 01/12/1956 Today's Date: 12/06/2021   History of Present Illness 66 year old with history of DM2, HTN, CKD admitted to the hospital for severe sepsis due to intra-abdominal abscess.  CT abdomen pelvis showed multiple loculated fluid collection in the lower abdomen with a large cavity in the pelvis measuring 11.5 X6X 11.7 cm.  Due to septic shock he was transferred to the ICU on 6/5.  Patient was seen by IR and drain was placed.  Hospital course was also complicated by acute kidney injury, hypoxia and atrial fibrillation with RVR.  Repeat CT showed resolution of abscess or the drain is in place but does have other pockets of fluid collection.  Patient is also on Cardizem drip, slowly uptitrating beta-blocker and heparin drip.   Repeat CT showed persistent abscess patient had drain placed 11/18/2021 by interventional radiology. 6/20 s/p Cystoscopy with bilateral retrograde pyelogram and bilateral open-ended ureteral catheter placement, exploratory laparotomy, drainage of pelvic abscess, wound vac. Pt transferred to ICU on 6/24 due to sepsis.    PT Comments    Pt is progressing well with therapy. Pt was taken outside to hospital garden for therapeutic purposes due to long hospital stay. He enjoyed the change of scenery, reporting it was important for his overall heath. Pt completed therapeutic activities outside and in hospital room. Currently pt requires Min assist for bed mobility and transfers, and Min assist +2 for safety/equipment for gait. Pt is limited by pain and fatigue. Recommending HHPT to continue skilled therapy in addressing above impairments. Will continue to treat in acute setting.    Recommendations for follow up therapy are one component of a multi-disciplinary discharge planning process, led by the attending physician.  Recommendations may be updated based on patient status, additional functional criteria  and insurance authorization.  Follow Up Recommendations  Home health PT     Assistance Recommended at Discharge Frequent or constant Supervision/Assistance  Patient can return home with the following A little help with walking and/or transfers;A little help with bathing/dressing/bathroom;Assistance with cooking/housework;Direct supervision/assist for medications management;Direct supervision/assist for financial management;Assist for transportation;Help with stairs or ramp for entrance   Equipment Recommendations  None recommended by PT    Recommendations for Other Services       Precautions / Restrictions Precautions Precautions: Fall Restrictions Weight Bearing Restrictions: No     Mobility  Bed Mobility Overal bed mobility: Needs Assistance Bed Mobility: Supine to Sit     Supine to sit: HOB elevated, Min assist     General bed mobility comments: Pt required Bil leg lift assist to EOB and hand held pull from therapist at EOB.    Transfers Overall transfer level: Needs assistance Equipment used: Rolling walker (2 wheels) Transfers: Sit to/from Stand Sit to Stand: Min assist           General transfer comment: Pt required minimal trunk lift assist for power up. Relied on VC's for proper body mechanics with upper trunk during power up. Completed sit to stands outside in 'Healing Garden' on concrete surface. He transferred onto an weight scale in hospital room, able to maintain balance stepping back into WC.     Ambulation/Gait Ambulation/Gait assistance: Min assist, +2 safety/equipment Gait Distance (Feet): 20 Feet Assistive device: Rolling walker (2 wheels) Gait Pattern/deviations: Step-through pattern, Decreased stride length Gait velocity: decr     General Gait Details: Pt ambulated from bed to outside room with RW. He ambulated ~6 ft on uneven concrete  surface along upward gradient in 'Healing Garden' outside on hospital property.   Stairs              Wheelchair Mobility    Modified Rankin (Stroke Patients Only)       Balance Overall balance assessment: Needs assistance Sitting-balance support: Single extremity supported Sitting balance-Leahy Scale: Fair Sitting balance - Comments: Pt relies on either Bil feet supported or one UE support for sitting balance.   Standing balance support: Bilateral upper extremity supported, During functional activity, Reliant on assistive device for balance Standing balance-Leahy Scale: Fair Standing balance comment: Pt limited by abdominal pain.                            Cognition Arousal/Alertness: Awake/alert Behavior During Therapy: WFL for tasks assessed/performed Overall Cognitive Status: Within Functional Limits for tasks assessed                                          Exercises      General Comments General comments (skin integrity, edema, etc.): We took pt outtside to the garden and he loved it. He enjoyed the change of scenery from being in hospital for almost one month.      Pertinent Vitals/Pain Pain Assessment Pain Assessment: 0-10 Pain Score: 6     Home Living                          Prior Function            PT Goals (current goals can now be found in the care plan section) Acute Rehab PT Goals Patient Stated Goal: To go home. PT Goal Formulation: With patient/family Time For Goal Achievement: 12/13/21 Potential to Achieve Goals: Good Progress towards PT goals: Progressing toward goals    Frequency    Min 3X/week      PT Plan Current plan remains appropriate    Co-evaluation              AM-PAC PT "6 Clicks" Mobility   Outcome Measure  Help needed turning from your back to your side while in a flat bed without using bedrails?: A Lot Help needed moving from lying on your back to sitting on the side of a flat bed without using bedrails?: A Lot Help needed moving to and from a bed to a chair  (including a wheelchair)?: A Little Help needed standing up from a chair using your arms (e.g., wheelchair or bedside chair)?: A Little Help needed to walk in hospital room?: A Little Help needed climbing 3-5 steps with a railing? : A Little 6 Click Score: 16    End of Session Equipment Utilized During Treatment: Gait belt Activity Tolerance: Patient tolerated treatment well Patient left: in chair;with call bell/phone within reach;with family/visitor present (Daughter) Nurse Communication: Mobility status PT Visit Diagnosis: Unsteadiness on feet (R26.81);Other abnormalities of gait and mobility (R26.89);Pain Pain - part of body:  (Abdomen)     Time: 8469-6295 PT Time Calculation (min) (ACUTE ONLY): 52 min  Charges:  $Gait Training: 23-37 mins                    Margie Ege, SPT [place] 12/06/2021, 4:04 PM

## 2021-12-06 NOTE — Progress Notes (Signed)
Nutrition Follow-up  DOCUMENTATION CODES:   Obesity unspecified  INTERVENTION:   Boost Breeze po TID, each supplement provides 250 kcal and 9 grams of protein  Double protein portions with each meal 30 ml ProSource Plus TID, each supplement provides 100 kcals and 15 grams protein.  Vitamin C 500 mg daily Zinc 220 mg daily x14 days  NUTRITION DIAGNOSIS:   Increased nutrient needs related to acute illness, post-op healing, wound healing as evidenced by estimated needs.  Ongoing.  GOAL:   Patient will meet greater than or equal to 90% of their needs  Progressing.  MONITOR:   PO intake, Supplement acceptance, Labs, Weight trends,  REASON FOR ASSESSMENT:   Consult Calorie Count  ASSESSMENT:   66 year old male with medical history of type 2 DM, HTN, CKD, osteoarthritis, and sleep apnea. He was admitted d/t severe sepsis d/t persistent intra-abdominal abscess. CT abdomen/pelvis showed multiple loculated fluid collection in the lower abdomen with a large cavity in the pelvis measuring 11.5 x 6 x 11.7 cm. He was seen by IR and drain was placed. Hospital course was also complicated by AKI, hypoxia and afib with RVR. Repeat CT on 6/9 showed resolution of abscess, drain was removed by IR on 6/13. Another CT abdomen was done on 6/14 and showed recurrent pelvic abscess. IR placed LLQ and RLQ drains on 6/15. On 6/18 patient had Candida tropicalis growing in abscess drainage. ID consulted patient started on micafungin. Repeat CT abdomen 6/19 with multiple fluid collections. On 6/20 patient underwent ex lap and drainage of pelvic abscess due to complicated diverticulitis not responding to medical therapy and drains.  6/4: admitted 6/21: TPN initiated 6/24: intubated 6/25: extubated 7/2: TPN discontinued  Patient's discharge was for 7/2. Calorie Count ended on 7/1 -breakfast.  6/30 Lunch: not documented 6/30 Dinner: 85 kcals, 1g protein 7/1: Breakfast: 150 kcals, 10g  protein Supplements: 250 kcals, 9g protein Total: 485 kcals (21% of needs), 20g protein (17% of needs)  Since completion of calorie count, pt consuming 0-25% of meals. TPN was discontinued 7/2.  Admission weight: 270 lbs. Current weight: 248 lbs.  Medications: Vitamin C, Colace, Remeron, Multivitamin with minerals daily, Miralax, Zinc sulfate   Labs reviewed: CBGs: 100-134   Diet Order:   Diet Order             DIET SOFT Room service appropriate? Yes; Fluid consistency: Thin  Diet effective now                   EDUCATION NEEDS:   Education needs have been addressed  Skin:  Skin Assessment: Skin Integrity Issues: Skin Integrity Issues:: Stage III, Incisions Stage III: bilateral ITs and R buttocks (newly documented 6/24) Incisions: abdomen (6/20) Other: MASD to bilateral buttocks  Last BM:  7/2 -type 6  Height:   Ht Readings from Last 1 Encounters:  11/27/21 5' 6.93" (1.7 m)    Weight:   Wt Readings from Last 1 Encounters:  12/05/21 112.5 kg    Ideal Body Weight:  67.3 kg  BMI:  Body mass index is 38.93 kg/m.  Estimated Nutritional Needs:   Kcal:  2300-2600 kcal  Protein:  115-130 grams  Fluid:  >/= 2.4 L/day   Clayton Bibles, MS, RD, LDN Inpatient Clinical Dietitian Contact information available via Am[employer]

## 2021-12-06 NOTE — Progress Notes (Signed)
WOC RN called bedside RN to request IV pain management prior to wound vac dressing change. Patient and daughter requested morphine not be given, but requested PO pain management instead. RN did not administer morphine which had already been pulled from Pyxis. RN administered ordered oxycodone instead.   Vicki Mallet, RN, witnessed waste of morphine dose.

## 2021-12-06 NOTE — Progress Notes (Signed)
  Progress Note   Patient: Robert Larson QQV:[id number] DOB: 1955-06-17 DOA: 11/07/2021     29 DOS: the patient was seen and examined on 12/06/2021 at 10:15AM      Brief hospital course: [name] is a 66 y.o. M with HTN, DM, OSA, CKD IIIa, and smoking who presented with sepsis from intra-abdominal abscesses.  See prior summary for complicated hospital course, requiring open drainage of intraabdominal abscesses, TPN dependence.  Now medically ready for discharge when home health services can be arranged.        Assessment and Plan: * Intra-abdominal abscesses s/p perc drainage 11/08/2021 S/p exploratory laparotomy and drainage of pelvic abscess by Dr. Donne Hazel on 6/20 - Continue antibiotics  - Okay to stop TPN  Paroxysmal atrial fibrillation with RVR ([place]) Will plan for cardiac monitor at d/c - Continue metoprolol - Outpatient cardiology follow up  OSA (obstructive sleep apnea) - Continue CPAP at night  Type 2 diabetes mellitus with morbid obesity ([place]) Glucoses good - Continue SS correction insulin  Essential hypertension BP controlled - Continue amlodipine, metoprolol          Subjective: No new complaints.     Physical Exam: Vitals:   12/05/21 1327 12/05/21 2021 12/06/21 0600 12/06/21 1140  BP: 125/76 118/67 (!) 149/78 119/77  Pulse: 66 (!) 58 95 68  Resp: '17 20 20 17  '$ Temp: 97.9 F (36.6 C) 98.5 F (36.9 C) 98.6 F (37 C) 97.9 F (36.6 C)  TempSrc:  Oral Oral Oral  SpO2: 100% 97% 98% 99%  Weight:      Height:       Adult male, lying in bed, no acute distress RRR, no murmurs, no peripheral edema Respiratory rate normal, lungs clear without rales or wheezes Abdomen soft without tenderness palpation or guarding, wound VAC in place Attention normal, affect normal, judgment insight appear normal  Data Reviewed: The patient was discharged yesterday but due to difficulty coordinating his complex home health regimen, his discharge has been  delayed till today, may be tomorrow  Family Communication: Daughter at the bedside    Disposition: Status is: Inpatient         Author: Edwin Dada, MD 12/06/2021 2:30 PM  For on call review [email or web address].

## 2021-12-06 NOTE — Progress Notes (Signed)
13 Days Post-Op   Subjective/Chief Complaint: Feeling better. Had a bm today   Objective: Vital signs in last 24 hours: Temp:  [97.9 F (36.6 C)-98.6 F (37 C)] 98.6 F (37 C) (07/03 0600) Pulse Rate:  [58-95] 95 (07/03 0600) Resp:  [17-20] 20 (07/03 0600) BP: (118-149)/(67-78) 149/78 (07/03 0600) SpO2:  [97 %-100 %] 98 % (07/03 0600) Last BM Date : 12/04/21  Intake/Output from previous day: 07/02 0701 - 07/03 0700 In: 1144.2 [P.O.:360; I.V.:529.9; IV Piggyback:244.3] Out: 1210 [Urine:1150; Drains:60] Intake/Output this shift: No intake/output data recorded.  General appearance: alert and cooperative Resp: clear to auscultation bilaterally Cardio: regular rate and rhythm GI: soft, mild tenderness. Dressing clean  Lab Results:  No results for input(s): "WBC", "HGB", "HCT", "PLT" in the last 72 hours. BMET Recent Labs    12/05/21 0340 12/06/21 0404  NA 134* 135  K 4.0 4.4  CL 107 107  CO2 21* 21*  GLUCOSE 118* 90  BUN 27* 26*  CREATININE 1.32* 1.32*  CALCIUM 8.7* 9.0   PT/INR No results for input(s): "LABPROT", "INR" in the last 72 hours. ABG No results for input(s): "PHART", "HCO3" in the last 72 hours.  Invalid input(s): "PCO2", "PO2"  Studies/Results: No results found.  Anti-infectives: Anti-infectives (From admission, onward)    Start     Dose/Rate Route Frequency Ordered Stop   12/06/21 1000  ertapenem (INVANZ) 1,000 mg in sodium chloride 0.9 % 100 mL IVPB        1 g 200 mL/hr over 30 Minutes Intravenous  Once 12/06/21 0906     12/05/21 1400  ertapenem (INVANZ) 1,000 mg in sodium chloride 0.9 % 100 mL IVPB  Status:  Discontinued        1 g 200 mL/hr over 30 Minutes Intravenous  Once 12/05/21 1058 12/05/21 1215   12/05/21 1400  piperacillin-tazobactam (ZOSYN) IVPB 3.375 g        3.375 g 12.5 mL/hr over 240 Minutes Intravenous Every 8 hours 12/05/21 1215     12/03/21 0000  caspofungin (CANCIDAS) IVPB        70 mg Intravenous Every 24 hours  12/03/21 1517 12/26/21 2359   12/03/21 0000  ertapenem (INVANZ) IVPB        1 g Intravenous Every 24 hours 12/03/21 1517 12/26/21 2359   11/30/21 2200  piperacillin-tazobactam (ZOSYN) IVPB 3.375 g  Status:  Discontinued        3.375 g 12.5 mL/hr over 240 Minutes Intravenous Every 8 hours 11/30/21 1435 12/05/21 1058   11/29/21 0600  vancomycin (VANCOREADY) IVPB 1250 mg/250 mL  Status:  Discontinued        1,250 mg 166.7 mL/hr over 90 Minutes Intravenous Every 24 hours 11/28/21 0759 11/29/21 0727   11/28/21 0500  vancomycin (VANCOCIN) IVPB 1000 mg/200 mL premix  Status:  Discontinued        1,000 mg 200 mL/hr over 60 Minutes Intravenous Every 24 hours 11/27/21 0414 11/28/21 0759   11/27/21 0600  meropenem (MERREM) 1 g in sodium chloride 0.9 % 100 mL IVPB  Status:  Discontinued        1 g 200 mL/hr over 30 Minutes Intravenous Every 8 hours 11/27/21 0346 11/30/21 1435   11/27/21 0430  vancomycin (VANCOREADY) IVPB 2000 mg/400 mL        2,000 mg 200 mL/hr over 120 Minutes Intravenous  Once 11/27/21 0346 11/27/21 0655   11/24/21 1400  piperacillin-tazobactam (ZOSYN) IVPB 3.375 g  Status:  Discontinued  3.375 g 12.5 mL/hr over 240 Minutes Intravenous Every 8 hours 11/24/21 1018 11/27/21 0323   11/24/21 1200  Ampicillin-Sulbactam (UNASYN) 3 g in sodium chloride 0.9 % 100 mL IVPB  Status:  Discontinued        3 g 200 mL/hr over 30 Minutes Intravenous Every 6 hours 11/24/21 0900 11/24/21 1018   11/23/21 0830  piperacillin-tazobactam (ZOSYN) IVPB 3.375 g  Status:  Discontinued        3.375 g 12.5 mL/hr over 240 Minutes Intravenous Every 8 hours 11/23/21 0736 11/24/21 0858   11/22/21 1200  Ampicillin-Sulbactam (UNASYN) 3 g in sodium chloride 0.9 % 100 mL IVPB  Status:  Discontinued        3 g 200 mL/hr over 30 Minutes Intravenous Every 6 hours 11/22/21 0923 11/23/21 0736   11/20/21 1645  micafungin (MYCAMINE) 100 mg in sodium chloride 0.9 % 100 mL IVPB        100 mg 105 mL/hr over 1 Hours  Intravenous Every 24 hours 11/20/21 1554     11/08/21 0730  vancomycin (VANCOREADY) IVPB 1750 mg/350 mL        1,750 mg 175 mL/hr over 120 Minutes Intravenous  Once 11/08/21 0641 11/08/21 0908   11/08/21 0641  vancomycin variable dose per unstable renal function (pharmacist dosing)  Status:  Discontinued         Does not apply See admin instructions 11/08/21 0641 11/08/21 1138   11/07/21 2000  piperacillin-tazobactam (ZOSYN) IVPB 3.375 g  Status:  Discontinued        3.375 g 12.5 mL/hr over 240 Minutes Intravenous Every 8 hours 11/07/21 1921 11/22/21 0923   11/07/21 1430  piperacillin-tazobactam (ZOSYN) IVPB 3.375 g        3.375 g 100 mL/hr over 30 Minutes Intravenous  Once 11/07/21 1418 11/07/21 1535       Assessment/Plan: s/p Procedure(s): EXPLORATORY LAPAROTOMY, PARTIAL COLECTOMY AND COLOSTOMY (N/A) CYSTOSCOPY WITH STENT PLACEMENT (Bilateral) Advance diet POD 12 s/p exploratory laparotomy and drainage of pelvic abscess by Dr. 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 The pelvic fluid collection that was noted on CT was able to be drained w/ 71F blake drain left behind in this area. IR drains were removed. There is question if his disease process was related to his small bowel rather than being diverticular in nature.  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 There does not appear to be a good surgical option at this point either.  - S/p IR drainage 6/24 of LLQ fluid collection. Monitor drain, stable light brown/purulent today  - TPN off. On soft diet + shakes/supplements and undergoing calorie count. Cont Remeron for appetite stimulation.  - Cont JP drain. SS - Cont abx. Prior IR cx w/ Candida tropicalis, E. Coli and Bacteroides. ID is following. They plan to d/c on IV  abx  - VAC M/W/F. WOCN following. - PT/OT for mobilization - OT recommending no f/u. PT rec HH PT  - TOC consult - HH PT/RN and home vac  - Appreciate TRH and IR assistance with his care. - He plans to stay with his mom at discharge with his daughter and daughter-in-law available for assistance   Follow up with Dr. Donne Hazel, keep drains at discharge   FEN - Soft diet, shakes, Remeron, IVF per primary, wean  1/2 TPN. Calorie Count VTE - SCDs, subq heparin. Cardiology no longer recommending anticoagulation and plans for 30-day monitor at discharge ID - Zosyn and micafungin    ABL anemia - stable A. Fib with RVR - resolved, lopressor.  AKI - resolved Acute respiratory failure with hypoxia - improved. 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

## 2021-12-06 NOTE — Consult Note (Signed)
[facility] Nurse wound follow up Patient receiving care in [place]. Wound type: abdominal surgical wound Measurement: 18.5 cm x 5.3 cm x 4 cm. Deepest at the inferior aspect in a "hole" that measures 4 cm deep. Wound bed: pink, clean Drainage (amount, consistency, odor) murky serosanginous in cannister Periwound: intact Dressing procedure/placement/frequency: After receiving pain medication and using multiple adhesive remover wipes, the foam was removed from the wound bed. I tucked an easily seen piece of foam into the hole, then filled the wound bed with an additional piece of foam. Drape applied, immediate seal obtained. Patient tolerated very well. Val Riles, RN, MSN, CWOCN, CNS-BC, pager [phone]

## 2021-12-06 NOTE — Progress Notes (Signed)
Patient ID: Robert Larson, male   DOB: [birth year], 66 y.o.   MRN: [id number]  Patient enrolled for [employer] to ship a 30 day cardiac event monitor to his address on file.  Letter with instructions mailed to patient.  Dr. Oval Linsey to read.

## 2021-12-07 LAB — GLUCOSE, CAPILLARY
Glucose-Capillary: 102 mg/dL — ABNORMAL HIGH (ref 70–99)
Glucose-Capillary: 82 mg/dL (ref 70–99)
Glucose-Capillary: 95 mg/dL (ref 70–99)
Glucose-Capillary: 96 mg/dL (ref 70–99)

## 2021-12-07 MED ORDER — HEPARIN SOD (PORK) LOCK FLUSH 100 UNIT/ML IV SOLN
250.0000 [IU] | INTRAVENOUS | Status: AC | PRN
  Administered 2021-12-07: 250 [IU]
  Filled 2021-12-07: qty 2.5

## 2021-12-07 MED ORDER — SODIUM CHLORIDE 0.9 % IV SOLN
1.0000 g | Freq: Once | INTRAVENOUS | Status: AC
  Administered 2021-12-07: 1000 mg via INTRAVENOUS
  Filled 2021-12-07: qty 1

## 2021-12-07 NOTE — Plan of Care (Signed)
  Problem: Clinical Measurements: Goal: Ability to maintain clinical measurements within normal limits will improve Outcome: Progressing   Problem: Activity: Goal: Risk for activity intolerance will decrease Outcome: Progressing   Problem: Coping: Goal: Level of anxiety will decrease Outcome: Progressing   Problem: Nutrition: Goal: Adequate nutrition will be maintained Outcome: Completed/Met

## 2021-12-07 NOTE — Progress Notes (Signed)
Patient discharged to home. All discharge instructions reviewed with patient and daughter including medications and follow up appointments. All questions answered. Yashar Inclan, Laurel Dimmer, RN

## 2021-12-07 NOTE — Discharge Summary (Signed)
Physician Discharge Summary   Patient: Robert Larson MRN: [id number] DOB: 1955/12/01  Admit date:     11/07/2021  Discharge date: 12/07/21  Discharge Physician: Robert Larson   PCP: Robert Sewer, NP     Recommendations at discharge:  Follow up with General Surgery as directed Follow up with PCP Robert Larson in 1 week After resolution of current illness, recommend liver MR to characterize liver hypodensity noted on imaging (see report below)     Discharge Diagnoses: Principal Problem:   Septic shock ([place]) Active Problems:   Intra-abdominal abscesses s/p perc drainage 11/08/2021   Essential hypertension   Type 2 diabetes mellitus with morbid obesity ([place])   Acute renal failure superimposed on stage 3a chronic kidney disease (HCC)   OSA (obstructive sleep apnea)   Adrenal nodule (HCC)   Renal cyst   Tobacco abuse   Pressure injury of skin   Obesity, Class III, BMI 40-49.9 (morbid obesity) ([place])   Paroxysmal atrial fibrillation with RVR (HCC)   Acute respiratory failure with hypoxia ([place])   Acute metabolic encephalopathy   Acute blood loss anemia      Hospital Course: Robert Larson is a 66 y.o. M with HTN, DM, OSA, CKD IIIa, and smoking who presented with sepsis from intra-abdominal abscesses.  6/4 Admitted on antibiotics.  CT showed intra-abdominal abscesses.  6/5 Worsened overnight and moved to ICU in septic shock, IR drain placed in abdomen, culture growing E coli and mixed anaerobic fluid  6/13 Drain removed by IR   6/14 Repeat CT showed recurrent pelvic abscess  6/15 Two new drains placed by IR in lower abdomen bilaterally, culture growing candida tropicalis  6/17 ID consulted, micafungin added  6/20 Patient underwent ex-lap, bilateral open ended ureteral catheter placement, wound vac placed, drain placed in pelvic fluid collection by Dr. Donne Hazel  6/24 Patient developed new fever, confusion, tachycardica, PCCM reconsulted, intubated for  hypoxic respiratory failure   6/25 Extubated and transferred [place]  6/26-7/2 PICC placed, treated with TPN, and worked with dietitian and therapy, as well as TOC to arrange outpatient IV antibiotics, home health therapy  7/3-7/4 Discharge delayed due to insurance non-coverage of home health services for wound vac, dishcarged instead with wet-to-dry dressings and General Surgery follow up             The [place] was reviewed for this patient prior to discharge.   Consultants: General Surgery, Infectious disease, Cardiology, Urology, Critical Care Procedures performed:  - CT guided percutaneous drain placement 6/15 by Dr. Anselm Pancoast x2 - Cystoscopy with bilateral retrograde pyelogram and bilateral open-ended ureteral catheter placement 6/20 by Dr. Gloriann Loan -  Exploratory laparotomy and drainage of pelvic abscess 6/20 by Dr. Donne Hazel - CT guided percutaneous drain placement 6/24 by Dr. Kathlene Cote      Disposition: Home health   DISCHARGE MEDICATION: Allergies as of 12/07/2021   No Known Allergies      Medication List     STOP taking these medications    dapagliflozin propanediol 5 MG Tabs tablet Commonly known as: FARXIGA   Farxiga 10 MG Tabs tablet Generic drug: dapagliflozin propanediol   FISH OIL PO   metFORMIN 500 MG 24 hr tablet Commonly known as: GLUCOPHAGE-XR   telmisartan-hydrochlorothiazide 80-25 MG tablet Commonly known as: MICARDIS HCT   Trulicity 1.5 JJ/0.0XF Sopn Generic drug: Dulaglutide   varenicline 0.5 MG tablet Commonly known as: CHANTIX       TAKE these medications    (feeding supplement) PROSource Plus  liquid Take 30 mLs by mouth 3 (three) times daily between meals.   amLODipine 10 MG tablet Commonly known as: NORVASC Take 1 tablet (10 mg total) by mouth daily.   ascorbic acid 500 MG tablet Commonly known as: VITAMIN C Take 1 tablet (500 mg total) by mouth daily.   caspofungin  IVPB Commonly known  as: CANCIDAS Inject 70 mg into the vein daily for 23 days. Indication:  Intra-abdominal infection/abscesses  First Dose: Yes Last Day of Therapy:  12/25/2021  Labs - Once weekly:  CBC/D and BMP, Labs - Every other week:  ESR and CRP Method of administration: Elastomeric Method of administration may be changed at the discretion of home infusion pharmacist based upon assessment of the patient and/or caregiver's ability to self-administer the medication ordered.   ertapenem  IVPB Commonly known as: INVANZ Inject 1 g into the vein daily for 23 days. Indication:  Intra-abdominal infection/abscesses  First Dose: Yes Last Day of Therapy:  12/25/2021  Labs - Once weekly:  CBC/D and BMP, Labs - Every other week:  ESR and CRP Method of administration: Mini-Bag Plus / Gravity Method of administration may be changed at the discretion of home infusion pharmacist based upon assessment of the patient and/or caregiver's ability to self-administer the medication ordered.   metoprolol succinate 25 MG 24 hr tablet Commonly known as: TOPROL-XL Take 1 tablet (25 mg total) by mouth at bedtime. START with 1/2 pill for one week, then increase to 1 full pill. What changed: additional instructions   mirtazapine 7.5 MG tablet Commonly known as: REMERON Take 1 tablet (7.5 mg total) by mouth at bedtime.   oxyCODONE 5 MG immediate release tablet Commonly known as: Oxy IR/ROXICODONE Take 1 tablet (5 mg total) by mouth every 6 (six) hours as needed for severe pain.   zinc sulfate 220 (50 Zn) MG capsule Take 1 capsule (220 mg total) by mouth daily.               Durable Medical Equipment  (From admission, onward)           Start     Ordered   12/02/21 1447  For home use only DME Walker rolling  Once       Question Answer Comment  Walker: With [place] Wheels   Patient needs a walker to treat with the following condition Status post surgery      12/02/21 1447              Discharge Care  Instructions  (From admission, onward)           Start     Ordered   12/05/21 0000  Discharge wound care:       Comments: As directed by [facility]   12/05/21 0845   12/03/21 0000  Change dressing on IV access line weekly and PRN  (Home infusion instructions - [facility] )        12/03/21 1517            Follow-up Information     Rolm Bookbinder, MD. Call on 12/24/2021.   Specialty: General Surgery Why: 7/21, arrive at 11:00 for paperwork. Please bring a copy of your photo ID and insurance card. Contact information: [place] [address] [phone]         Aletta Edouard, MD. Schedule an appointment as soon as possible for a visit.   Specialties: Interventional Radiology, Radiology Contact information: [address] [place] [address] [phone]         Robert Sewer, NP. Schedule an appointment as soon as possible for a visit in 1 week(s).   Specialty: Family Medicine Contact information: [place] [address] [phone]         Janina Mayo, MD. Go to.   Specialty: Cardiology Contact information: [address] [place] [address] [phone]         Thayer Headings, MD. Schedule an appointment as soon as possible for a visit in 1 month(s).   Specialty: Infectious Diseases Contact information: [address] [place] [address] [phone]                 Discharge Instructions     [facility] pharmacist to adjust dose for Vancomycin, Aminoglycosides and other anti-infective therapies as requested by physician.   Complete by: As directed    [facility] to provide Cath Flo 75m   Complete by: As directed    Administer for PICC line occlusion and as ordered by physician for other access device issues.   Anaphylaxis Kit: Provided to treat any anaphylactic reaction to the medication  being provided to the patient if First Dose or when requested by physician   Complete by: As directed    Epinephrine 185mml vial / amp: Administer 0.51m69m0.51ml7mubcutaneously once for moderate to severe anaphylaxis, nurse to call physician and pharmacy when reaction occurs and call 911 if needed for immediate care   Diphenhydramine 50mg41mIV vial: Administer 25-50mg 57mM PRN for first dose reaction, rash, itching, mild reaction, nurse to call physician and pharmacy when reaction occurs   Sodium Chloride 0.9% NS 500ml I20mdminister if needed for hypovolemic blood pressure drop or as ordered by physician after call to physician with anaphylactic reaction   Change dressing on IV access line weekly and PRN   Complete by: As directed    Discharge instructions   Complete by: As directed    From Dr. DanfordLoleta Booksere admitted for abscess (infection collection) in your abdomen.  You had surgery by Dr. WakefieDonne Hazelin this.  You should follow up with [facility]  They will help you manage your wound and your drains  Their number is below in the To Do section. If you don't already know the date of your follow up appointment, call them to find out on Monday.   Also, they have a nurse triage line, so if you have quesitons about new symptoms, or concerns with your wounds, or your nutrition, do not hesitate to call them.  To finish treating the infection: Take the caspofungin and ertapenem antibiotics as directed You should be taking these until around July 23 The home health agency should draw blood work once weekly to monitor this while you are on antibiotics You would be wise to call the infectious disease doctors (Dr. Comer, Linus Salmonsd below in the To Do section) to get an appointment with Dr. Comer oLinus Salmons of the other doctors [facility] the next 4-6 weeks  Go see your primary care doctor in the next 2 weeks, to  check in  STOP taking your FarxigaWilder Gladew, you can restart this later STOP taking the telmisartan/HCTZ also, and replace it with the blood pressure medicine amlodipine for now Follow up with your primary care doctor to adjust  For  your heart, while you were here, you were noted to be in an abnormal heart rhythm called "atrial fibrillation". Go see Dr. Harl Bowie in the next 4-6 weeks to evaluate this. They should contact you soon to arrange a heart monitor that you can wear at home for the next month   To promote nutrition: Take vitamin C and Zinc supplements for the next few months We recommend some nutritional supplements by mouth: Prosource Plus (up to three times a day) and Boost (once or twice a day) if you can Take the medicine mirtazapine to boost appetite   Discharge wound care:   Complete by: As directed    As directed by [facility] Surgery   Flush IV access with Sodium Chloride 0.9% and Heparin 10 units/ml or 100 units/ml   Complete by: As directed    Home infusion instructions - [facility]   Complete by: As directed    Instructions: Flush IV access with Sodium Chloride 0.9% and Heparin 10units/ml or 100units/ml   Change dressing on IV access line: Weekly and PRN   Instructions Cath Flo 49m: Administer for PICC Line occlusion and as ordered by physician for other access device   [facility] pharmacist to adjust dose for: Vancomycin, Aminoglycosides and other anti-infective therapies as requested by physician   Increase activity slowly   Complete by: As directed    Method of administration may be changed at the discretion of home infusion pharmacist based upon assessment of the patient and/or caregiver's ability to self-administer the medication ordered   Complete by: As directed        Discharge Exam: Filed Weights   12/05/21 0000 12/06/21 1438 12/07/21 0500  Weight: 112.5 kg 110.5 kg 108.3 kg    General: Pt is alert, awake, not in acute  distress Abdominal:  No distension or ascites.   Neuro/Psych: Strength symmetric in upper and lower extremities.  Judgment and insight appear normal.   Condition at discharge: good  The results of significant diagnostics from this hospitalization (including imaging, microbiology, ancillary and laboratory) are listed below for reference.   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 Status post laparotomy 4 days ago with placement of surgical drain in residual pelvic fluid collection. The patient has become more critically ill with CT demonstrating loculated extraluminal air in the left lower quadrant. EXAM: CT GUIDED CATHETER DRAINAGE OF LEFT LOWER QUADRANT PERITONEAL ABSCESS ANESTHESIA/SEDATION: The patient is intubated, on pressors and was not given additional conscious sedation during the procedure. PROCEDURE: The procedure, risks, benefits, and alternatives were explained to the patient's daughter. Questions regarding the procedure were encouraged and answered. The patient's daughter understands and consents to the procedure. A time out was performed prior to initiating the procedure. CT of the lower abdomen and pelvis was performed in a supine position. The left lower abdominal wall was prepped with chlorhexidine in a sterile fashion, and a sterile drape was applied covering the operative field. A sterile gown and sterile gloves were used for the procedure. Local anesthesia was provided with 1% Lidocaine. Under CT guidance, an 18 gauge trocar needle was advanced into a left lower quadrant abscess. After confirming needle tip position, a guidewire was advanced into the collection and the needle removed. The percutaneous tract was dilated over the wire. A 12 French percutaneous drainage catheter was advanced over the wire and formed. Drainage catheter  position was  confirmed by CT. A fluid sample was aspirated from the drain and sent for culture analysis. The drainage catheter was connected to suction bulb drainage. The percutaneous drain was secured at the skin with a Prolene retention suture and StatLock device. A dressing was applied over the catheter exit site. RADIATION DOSE REDUCTION: This exam was performed according to the departmental dose-optimization program which includes automated exposure control, adjustment of the mA and/or kV according to patient size and/or use of iterative reconstruction technique. COMPLICATIONS: None FINDINGS: At the time of the drainage procedure, some of the collection that had previously contained only loculated air in the anterior left lower peritoneal cavity now also contains fluid and an elongated air-fluid level. After drainage catheter placement within this collection, there is return of purulent, brownish colored liquid. A sample was sent for culture analysis. There is good return of fluid as well as some air from the drain after placement. IMPRESSION: CT-guided percutaneous catheter drainage of left lower quadrant peritoneal abscess with return of air as well as brownish colored purulent fluid. A sample of fluid was sent for culture analysis. A 12 French drain was placed and attached to suction bulb drainage. Electronically Signed   By: Aletta Edouard M.D.   On: 11/27/2021 14:43   DG CHEST PORT 1 VIEW  Result Date: 11/27/2021 CLINICAL DATA:  OG tube placement. EXAM: PORTABLE CHEST 1 VIEW COMPARISON:  11/27/2021. FINDINGS: The heart is enlarged the mediastinal contour is stable. The pulmonary vasculature is prominent. Lung volumes are low. Mild residual atelectasis is noted at the left lung base, improved from the prior exam. There is a small left pleural effusion. No pneumothorax. The right PICC line is stable. An enteric tube terminates in the stomach. The endotracheal tube terminates 4.9 cm above the carina.  IMPRESSION: 1. Cardiomegaly with pulmonary vascular congestion. 2. Mild atelectasis at the left lung base. 3. Small left pleural effusion. 4. Lines and tubes as described above. Electronically Signed   By: Brett Fairy M.D.   On: 11/27/2021 04:26   CT HEAD WO CONTRAST (5MM)  Result Date: 11/27/2021 CLINICAL DATA:  Delirium, altered mental status EXAM: CT HEAD WITHOUT CONTRAST TECHNIQUE: Contiguous axial images were obtained from the base of the skull through the vertex without intravenous contrast. RADIATION DOSE REDUCTION: This exam was performed according to the departmental dose-optimization program which includes automated exposure control, adjustment of the mA and/or kV according to patient size and/or use of iterative reconstruction technique. COMPARISON:  None Available. FINDINGS: Brain: Normal anatomic configuration. No abnormal intra or extra-axial mass lesion or fluid collection. No abnormal mass effect or midline shift. No evidence of acute intracranial hemorrhage or infarct. Ventricular size is normal. Cerebellum unremarkable. Vascular: Unremarkable Skull: Intact Sinuses/Orbits: Paranasal sinuses are clear. Orbits are unremarkable. Other: Mastoid air cells and middle ear cavities are clear. IMPRESSION: No acute intracranial abnormality. Electronically Signed   By: Fidela Salisbury M.D.   On: 11/27/2021 03:05   CT ABDOMEN PELVIS WO CONTRAST  Result Date: 11/27/2021 CLINICAL DATA:  Sepsis, altered mental status EXAM: CT ABDOMEN AND PELVIS WITHOUT CONTRAST TECHNIQUE: Multidetector CT imaging of the abdomen and pelvis was performed following the standard protocol without IV contrast. RADIATION DOSE REDUCTION: This exam was performed according to the departmental dose-optimization program which includes automated exposure control, adjustment of the mA and/or kV according to patient size and/or use of iterative reconstruction technique. COMPARISON:  11/22/2021 FINDINGS: Lower chest: Small left pleural  effusion with associated left lower lobe collapse and consolidation  is unchanged. Progressive ground-glass pulmonary infiltrate within the lingula and right middle lobe, likely infectious or inflammatory in nature. Cardiac size within normal limits. No pericardial effusion. Hepatobiliary: Previously noted intrahepatic hypoenhancing mass is not well appreciated on this noncontrast examination. The liver contour is mildly nodular and there is hypertrophy of the left hepatic lobe in keeping with changes of cirrhosis. No intra or extrahepatic biliary ductal dilation. Gallbladder unremarkable. Pancreas: Unremarkable Spleen: Unremarkable Adrenals/Urinary Tract: 1.5 cm right adrenal nodule and 2.5 cm left adrenal nodules are stable sore indeterminate with medius 10 UA shin of 27-29 Hounsfield units. The kidneys are normal in size and position. Multiple simple cortical cysts are again identified bilaterally, better appreciated on prior examination. No further follow-up is recommended for these lesions. No hydronephrosis. No intrarenal or ureteral calculi. Foley catheter balloon is seen within a decompressed bladder lumen. Stomach/Bowel: Interval development of a moderate free intraperitoneal gas, possibly postsurgical in nature. Laparotomy incision now identified with wound VAC in place superiorly. Deep abdominal fascia appears intact though there is superficial dehiscence of the laparotomy incision. Right lower quadrant Blake drain is seen looped within the pelvis. Small amount of free intraperitoneal fluid is seen within the pelvis. Previously noted right lower quadrant pigtail drainage catheter has been removed. A a loculated gas collection is seen along the right lateral aspect of the abdomen extending into the deep pelvis approaching the [facility] drain. This measures at least 5.3 x 9.9 by 21 cm in greatest dimension. No significant associated fluid component is identified with this collection. A small loculated  curvilinear gas and fluid collection is identified within the deep pelvis measuring roughly 5.7 x 6.1 cm at axial image # 66/3. The stomach, small bowel, and large bowel are otherwise unremarkable. Appendix normal. Vascular/Lymphatic: Moderate aortoiliac atherosclerotic calcification. No aortic aneurysm. No pathologic adenopathy within the abdomen and pelvis. Reproductive: Prostate is unremarkable. Other: Stable subcutaneous edema within the flanks bilaterally in keeping with mild anasarca. Musculoskeletal: Left total hip arthroplasty has been performed. Degenerative changes are seen within the lumbar spine. No acute bone abnormality. IMPRESSION: 1. Interval development of moderate free intraperitoneal gas, likely postsurgical in nature. 2. Interval development of a large loculated gas collection within the right lateral abdomen extending into the deep pelvis approaching the [facility] drain. This measures at least 5.3 x 9.9 x 21 cm in greatest dimension. No significant associated fluid component is identified with this collection. 3. Small loculated gas and fluid collection within the deep pelvis measuring 6.1 cm in greatest dimension. 4. Interval development of ground-glass pulmonary infiltrate within the lingula and right middle lobe, likely infectious or inflammatory in nature. 5. Stable small left pleural effusion with associated left lower lobe collapse and consolidation. 6. Morphologic changes in keeping with cirrhosis. Previously noted intrahepatic mass is not well appreciated on this noncontrast examination. 7. Stable indeterminate bilateral adrenal nodules. Electronically Signed   By: Fidela Salisbury M.D.   On: 11/27/2021 03:01   DG CHEST PORT 1 VIEW  Result Date: 11/27/2021 CLINICAL DATA:  Difficulty breathing EXAM: PORTABLE CHEST 1 VIEW COMPARISON:  11/10/2021 FINDINGS: Right-sided PICC is noted in satisfactory position. Cardiac shadow is enlarged but stable. Increased vascular congestion is noted similar  to that seen on the prior exam. No focal confluent infiltrate or sizable effusion is noted. Mild left basilar atelectasis is seen. IMPRESSION: Vascular congestion without significant edema. Mild left basilar atelectasis. Electronically Signed   By: Inez Catalina M.D.   On: 11/27/2021 01:50   [place] EKG SITE  RITE  Result Date: 11/24/2021 If Site Rite image not attached, placement could not be confirmed due to current cardiac rhythm.  DG C-Arm 1-60 Min-No Report  Result Date: 11/23/2021 Fluoroscopy was utilized by the requesting physician.  No radiographic interpretation.   CT ABDOMEN PELVIS W CONTRAST  Result Date: 11/22/2021 CLINICAL DATA:  Abdominal pain. EXAM: CT ABDOMEN AND PELVIS WITH CONTRAST TECHNIQUE: Multidetector CT imaging of the abdomen and pelvis was performed using the standard protocol following bolus administration of intravenous contrast. RADIATION DOSE REDUCTION: This exam was performed according to the departmental dose-optimization program which includes automated exposure control, adjustment of the mA and/or kV according to patient size and/or use of iterative reconstruction technique. CONTRAST:  118m OMNIPAQUE IOHEXOL 300 MG/ML  SOLN COMPARISON:  11/18/2021 and earlier FINDINGS: Lower chest: There are bilateral pleural effusions. Bibasilar atelectasis and LEFT LOWER lobe consolidation are similar to prior study. Heart size is normal. Hepatobiliary: Cirrhotic contour of the liver. A 3.1 centimeter low-attenuation lesion is identified in the posterior segment of the RIGHT hepatic lobe, not seen on prior noncontrast exams. Further evaluation is indicated. Gallbladder is distended.  No radiopaque calculi. Pancreas: Unremarkable. No pancreatic ductal dilatation or surrounding inflammatory changes. Spleen: Normal in size without focal abnormality. Adrenals/Urinary Tract: A 1.1 centimeter nodule in the RIGHT adrenal gland is stable. A 2.5 centimeter nodule in the LEFT adrenal gland is stable.  Numerous bilateral renal cysts are present. Largest is in the LEFT kidney, 5.9 centimeters. No hydronephrosis. Ureters are unremarkable. Urinary bladder is distended and otherwise normal. Stomach/Bowel: Stomach and small bowel loops are normal in appearance. Contrast is seen in the colon to the level of the anus. There are numerous colonic diverticula. There is focal wall thickening in the region of the redundant sigmoid colon. There is moderate mesenteric edema. Small amount of mesenteric fluid identified adjacent to the hepatic segment of the colon and in the RIGHT UPPER QUADRANT, without evidence for abscess. The largest fluid collection is 7.3 x 2.9 x 2.0 centimeters. RIGHT LOWER QUADRANT a drain is in place. No significant fluid collections around the drain. There has been almost complete resolution of air-fluid collection between the bladder in the rectum (image 69 of series 2 and 74 of series 6). This collection now measures 6.2 by 2.2 centimeters. Previously, this measured 9.4 x 6.6 centimeters. Stable appearance of small air-fluid collection in the LEFT central pelvis measuring 6.4 x 3.5 centimeters (image 57 of series 2). Significantly smaller oval collection of fluid contains pigtail drain in the LEFT mid abdomen, now measuring 5.5 x 1.7 centimeters and previously 6.3 centimeters. (Image 46 of series 2). Vascular/Lymphatic: There is atherosclerotic calcification of the abdominal aorta. Small para aortic lymph nodes are likely reactive. Reproductive: Prostate is unremarkable. Other: There is edema of the LATERAL abdominal wall bilaterally, LEFT greater than RIGHT. Musculoskeletal: Degenerative changes in the LOWER lumbar spine. Prior LEFT hip arthroplasty. IMPRESSION: 1. Improving multiple fluid collections within the abdomen and pelvis. 2. No significant fluid around the RIGHT LOWER lobe drain. 3. Small fluid around the LEFT LOWER lobe drain, with significant improvement in this collection. 4. Near  complete resolution of air fluid collection between the rectum and the bladder. 5. Stable small collection in the central pelvis. 6. Diverticulosis. 7. Indeterminate low-attenuation lesion in the RIGHT hepatic lobe measuring 3.1 centimeters. This has not been fully characterized. In the setting of cirrhosis, further characterization is needed. Recommend non emergent outpatient MRI of the liver. 8.  Aortic atherosclerosis.  (ICD10-I70.0) 9.  Abdominal wall edema. 10. Bilateral pleural effusions, bibasilar atelectasis, and LEFT LOWER lobe consolidation are similar. 11. Stable bilateral adrenal nodules. Electronically Signed   By: Nolon Nations M.D.   On: 11/22/2021 12:02   CT IMAGE GUIDED DRAINAGE BY PERCUTANEOUS CATHETER  Result Date: 11/18/2021 INDICATION: 66 year old with intra-abdominal abscesses, suspect diverticular origin but etiology is unclear. Previous right transgluteal drain was removed. Re-accumulation of an air-fluid collection in the pelvis along with additional collections throughout the abdomen. EXAM: 1. CT-guided placement of a drain in the right lower abdominal abscess 2. CT-guided placement of a drain in left lateral abdominal abscess MEDICATIONS: Moderate sedation ANESTHESIA/SEDATION: Moderate (conscious) sedation was employed during this procedure. A total of Versed 4.41m and fentanyl 150 mcg was administered intravenously at the order of the provider performing the procedure. Total intra-service moderate sedation time: 66 minutes. Patient's level of consciousness and vital signs were monitored continuously by radiology nurse throughout the procedure under the supervision of the provider performing the procedure. COMPLICATIONS: None immediate. PROCEDURE: Informed written consent was obtained from the patient after a thorough discussion of the procedural risks, benefits and alternatives. All questions were addressed. Maximal Sterile Barrier Technique was utilized including caps, mask,  sterile gowns, sterile gloves, sterile drape, hand hygiene and skin antiseptic. A timeout was performed prior to the initiation of the procedure. Patient was placed in left lateral decubitus position. Images through the abdomen and pelvis were obtained. The large air-fluid collection the right lower quadrant of the abdomen was targeted. The right side of the abdomen was prepped with chlorhexidine and sterile field was created. Skin was anesthetized with 1% lidocaine. Small incision was made. Using CT guidance, 18 gauge trocar needle was directed into the collection and superstiff Amplatz wire was advanced into the collection. The tract was dilated to accommodate 12 FPakistandrain. Approximately 80 mL of brown purulent fluid was removed from this collection and follow up CT images were obtained. This drain appeared to be decompressing not only the right lower quadrant abscess but also the pelvic abscess. Therefore, another transgluteal drain was not placed. The catheter was sutured to skin and attached to a suction bulb. Fluid was collected for culture. The patient was rolled into the onto his back and images were obtained in the supine position. The fluid collection along the lateral left abdomen was targeted. The left side of the abdomen was prepped chlorhexidine and sterile field was created. Maximal barrier sterile technique was utilized including caps, mask, sterile gowns, sterile gloves, sterile drape, hand hygiene and skin antiseptic. Skin was anesthetized with 1% lidocaine. Small incision was made. Using CT guidance, an 18 gauge trocar needle was directed into the collection and cloudy yellow fluid was aspirated. Superstiff Amplatz wire was advanced and the tract was dilated to accommodate a 10 [place]. Approximately 30 mL of cloudy yellow purulent fluid was removed. Follow up CT images were obtained. Drain was sutured to skin and attached to a suction bulb. Fluid was sent for culture.  RADIATION DOSE REDUCTION: This exam was performed according to the departmental dose-optimization program which includes automated exposure control, adjustment of the mA and/or kV according to patient size and/or use of iterative reconstruction technique. FINDINGS: 165French drain placed in the large gas-filled collection in the right lower quadrant. 80 mL of purulent fluid was removed from this drain. 127French drain was decompressing the right lower quadrant abscess as well as the pelvic abscess. 10 French drain placed in left lateral abdominal abscess. 30 mL  of cloudy yellow fluid was removed from this drain. Enlargement of the right piriformis muscle compatible with a hematoma formation from the previous transgluteal drain. IMPRESSION: 1. CT-guided placement of abscess drain in the right lower quadrant abscess. This drain appears to be decompressing the right lower quadrant abscess and the pelvic abscess. 2. CT-guided placement of a drainage catheter in the left lateral abdominal abscess. Electronically Signed   By: Markus Daft M.D.   On: 11/18/2021 17:37   CT IMAGE GUIDED DRAINAGE BY PERCUTANEOUS CATHETER  Result Date: 11/18/2021 INDICATION: 66 year old with intra-abdominal abscesses, suspect diverticular origin but etiology is unclear. Previous right transgluteal drain was removed. Re-accumulation of an air-fluid collection in the pelvis along with additional collections throughout the abdomen. EXAM: 1. CT-guided placement of a drain in the right lower abdominal abscess 2. CT-guided placement of a drain in left lateral abdominal abscess MEDICATIONS: Moderate sedation ANESTHESIA/SEDATION: Moderate (conscious) sedation was employed during this procedure. A total of Versed 4.2m and fentanyl 150 mcg was administered intravenously at the order of the provider performing the procedure. Total intra-service moderate sedation time: 66 minutes. Patient's level of consciousness and vital signs were monitored  continuously by radiology nurse throughout the procedure under the supervision of the provider performing the procedure. COMPLICATIONS: None immediate. PROCEDURE: Informed written consent was obtained from the patient after a thorough discussion of the procedural risks, benefits and alternatives. All questions were addressed. Maximal Sterile Barrier Technique was utilized including caps, mask, sterile gowns, sterile gloves, sterile drape, hand hygiene and skin antiseptic. A timeout was performed prior to the initiation of the procedure. Patient was placed in left lateral decubitus position. Images through the abdomen and pelvis were obtained. The large air-fluid collection the right lower quadrant of the abdomen was targeted. The right side of the abdomen was prepped with chlorhexidine and sterile field was created. Skin was anesthetized with 1% lidocaine. Small incision was made. Using CT guidance, 18 gauge trocar needle was directed into the collection and superstiff Amplatz wire was advanced into the collection. The tract was dilated to accommodate 12 FPakistandrain. Approximately 80 mL of brown purulent fluid was removed from this collection and follow up CT images were obtained. This drain appeared to be decompressing not only the right lower quadrant abscess but also the pelvic abscess. Therefore, another transgluteal drain was not placed. The catheter was sutured to skin and attached to a suction bulb. Fluid was collected for culture. The patient was rolled into the onto his back and images were obtained in the supine position. The fluid collection along the lateral left abdomen was targeted. The left side of the abdomen was prepped chlorhexidine and sterile field was created. Maximal barrier sterile technique was utilized including caps, mask, sterile gowns, sterile gloves, sterile drape, hand hygiene and skin antiseptic. Skin was anesthetized with 1% lidocaine. Small incision was made. Using CT guidance, an  18 gauge trocar needle was directed into the collection and cloudy yellow fluid was aspirated. Superstiff Amplatz wire was advanced and the tract was dilated to accommodate a 10 [place] drain. Approximately 30 mL of cloudy yellow purulent fluid was removed. Follow up CT images were obtained. Drain was sutured to skin and attached to a suction bulb. Fluid was sent for culture. RADIATION DOSE REDUCTION: This exam was performed according to the departmental dose-optimization program which includes automated exposure control, adjustment of the mA and/or kV according to patient size and/or use of iterative reconstruction technique. FINDINGS: 166French drain placed in the  large gas-filled collection in the right lower quadrant. 80 mL of purulent fluid was removed from this drain. 3 French drain was decompressing the right lower quadrant abscess as well as the pelvic abscess. 10 French drain placed in left lateral abdominal abscess. 30 mL of cloudy yellow fluid was removed from this drain. Enlargement of the right piriformis muscle compatible with a hematoma formation from the previous transgluteal drain. IMPRESSION: 1. CT-guided placement of abscess drain in the right lower quadrant abscess. This drain appears to be decompressing the right lower quadrant abscess and the pelvic abscess. 2. CT-guided placement of a drainage catheter in the left lateral abdominal abscess. Electronically Signed   By: Markus Daft M.D.   On: 11/18/2021 17:37   CT ABDOMEN PELVIS WO CONTRAST  Result Date: 11/17/2021 CLINICAL DATA:  Follow-up complicated diverticular abscesses. Recently patient had pelvic catheter removed. EXAM: CT ABDOMEN AND PELVIS WITHOUT CONTRAST TECHNIQUE: Multidetector CT imaging of the abdomen and pelvis was performed following the standard protocol without IV contrast. RADIATION DOSE REDUCTION: This exam was performed according to the departmental dose-optimization program which includes automated exposure  control, adjustment of the mA and/or kV according to patient size and/or use of iterative reconstruction technique. COMPARISON:  Prior CT scan 11/12/2021 FINDINGS: Lower chest: Persistent small pleural effusions and bibasilar atelectasis. Hepatobiliary: Stable cirrhotic changes involving liver no hepatic lesions are identified. Evidence of portal venous hypertension and portal venous collaterals. Layering high attenuation material in the gallbladder is likely sludge. No findings suspicious for acute cholecystitis. No common bile duct dilatation. Pancreas: No mass, inflammation or ductal dilatation. Spleen: Stable splenomegaly. Adrenals/Urinary Tract: Stable bilateral adrenal gland adenomas. Stable simple and hemorrhagic renal cysts. The bladder is unremarkable. Stomach/Bowel: The stomach duodenum, small and colon are grossly normal. There is contrast in the distal ileum and also throughout the colon. I do not see any leaking contrast. Vascular/Lymphatic: Stable vascular calcifications and scattered mesenteric and retroperitoneal lymph nodes. Reproductive: The prostate gland is mildly enlarged but stable. Other: There is a recurrent abscess in the central low pelvis between the bladder and rectum. It measures approximately 9.4 x 6.6 cm. There is a loculated fluid collection in the left pericolic gutter region lateral to the descending colon which is slightly larger when compared to the prior CT scan. It does not contain any gas. It measures approximately 7.8 x 3.2 cm on coronal image number 60. Difficult to exclude other central pelvic abscesses versus dilated small bowel loops with air-fluid levels. No free air or leaking oral contrast is demonstrated. Musculoskeletal: No significant bony findings. IMPRESSION: 1. Recurrent central low pelvic abscess measuring approximately 9.4 x 6.6 cm. 2. Slightly larger loculated fluid collection in the left paracolic gutter region lateral to the descending colon. Difficult to  exclude other central pelvic abscesses versus dilated small bowel loops with air-fluid levels. 3. No free air or leaking oral contrast is demonstrated. 4. Stable cirrhotic changes involving liver with portal venous hypertension, portal venous collaterals and splenomegaly. 5. Stable bilateral adrenal gland adenomas. 6. Stable simple and hemorrhagic renal cysts. 7. Persistent small pleural effusions and bibasilar atelectasis. These results will be called to the ordering clinician or representative by the Radiologist Assistant, and communication documented in the PACS or [employer]. Electronically Signed   By: Marijo Sanes M.D.   On: 11/17/2021 15:26   CT ABDOMEN PELVIS WO CONTRAST  Result Date: 11/12/2021 CLINICAL DATA:  Inpatient. Septic shock. Multiple intra-abdominal abscesses, possible perforated diverticulitis. Percutaneous CT-guided transgluteal pelvic drainage catheter placed  4 days prior. EXAM: CT ABDOMEN AND PELVIS WITHOUT CONTRAST TECHNIQUE: Multidetector CT imaging of the abdomen and pelvis was performed following the standard protocol without IV contrast. RADIATION DOSE REDUCTION: This exam was performed according to the departmental dose-optimization program which includes automated exposure control, adjustment of the mA and/or kV according to patient size and/or use of iterative reconstruction technique. COMPARISON:  11/07/2021 CT abdomen/pelvis. FINDINGS: Lower chest: New complete left lower lobe atelectasis and increased moderate lingular atelectasis. Mild-to-moderate patchy ground-glass opacity throughout right middle lobe. Coronary atherosclerosis. Hepatobiliary: Diffuse hepatic steatosis. Diffusely irregular liver surface with relative hypertrophy of the lateral segment left liver lobe, compatible with cirrhosis. No liver masses. Normal gallbladder with no radiopaque cholelithiasis. No biliary ductal dilatation. Pancreas: Normal, with no mass or duct dilation. Spleen: Normal size. No mass.  Adrenals/Urinary Tract: Left adrenal 2.5 cm nodule with density 29 HU, stable size. Right adrenal 1.5 cm nodule with density 21 HU, stable size. No hydronephrosis. No renal stones. Multiple simple bilateral renal cysts, largest 5.7 cm in anterior upper left kidney and subcentimeter hyperdense left renal cortical foci that are too small to characterize, unchanged, for which no follow-up is recommended. Small amount of gas in the non dependent bladder, which is mildly distended and otherwise normal. Stomach/Bowel: Enteric tube terminates in the body of the stomach. Stomach is nondistended and otherwise normal. Normal caliber small bowel with no small bowel wall thickening. Appendix appears normal. Oral contrast transits to the rectum. Scattered diverticulosis throughout the colon, most prominent in the sigmoid colon. Mild wall thickening and pericolonic fat stranding in the proximal sigmoid colon, similar. Vascular/Lymphatic: Atherosclerotic nonaneurysmal abdominal aorta. No pathologically enlarged lymph nodes in the abdomen or pelvis. Reproductive: Mild prostatomegaly. Other: Right transgluteal percutaneous pigtail drain terminates between the rectum and bladder to the left of midline, with no residual fluid collection in this location. Gas containing 6.4 x 4.8 x 5.0 cm (volume = 80 cm^3) right lower quadrant collection (series 2/image 60), previously 7.0 x 3.5 x 3.9 cm (volume = 50 cm^3) using similar measurement technique, overall mildly increased. Irregular gas containing left lower quadrant mesenteric 8.0 x 5.7 cm collection (series 2/image 60), increased from 5.9 x 4.0 cm. Multiple new interloop fluid collections in the left greater than right small bowel mesentery, largest 5.8 x 3.5 cm in the left small bowel mesentery (series 2/image 45). Musculoskeletal: No aggressive appearing focal osseous lesions. Left total hip arthroplasty. Marked thoracolumbar spondylosis. IMPRESSION: 1. Multifocal intraperitoneal  abscesses with mixed interval changes. 2. Right transgluteal percutaneous pigtail drain terminates between the rectum and bladder to the left of midline, with no residual fluid collection in this location. 3. Gas-containing right lower quadrant collection is mildly increased. Irregular gas-containing left lower quadrant mesenteric collection, increased. Multiple new interloop fluid collections in the small bowel mesentery. 4. New complete left lower lobe atelectasis and increased moderate lingular atelectasis. Mild-to-moderate patchy ground-glass opacity throughout the right middle lobe, nonspecific, probably inflammatory. 5. Cirrhosis. No liver masses. 6. Stable bilateral adrenal nodules with indeterminate density. Follow-up CT abdomen without and with IV contrast suggested in 12 months. 7. Small amount of gas in the non dependent bladder, nonspecific, correlate for history of recent bladder instrumentation. 8. Aortic Atherosclerosis (ICD10-I70.0). Electronically Signed   By: Ilona Sorrel M.D.   On: 11/12/2021 11:50   DG Abd 1 View  Result Date: 11/11/2021 CLINICAL DATA:  Nasogastric tube placement EXAM: ABDOMEN - 1 VIEW COMPARISON:  11/09/2021 FINDINGS: A feeding tube is present with tip in the  stomach body oriented towards the antrum. Retrocardiac airspace opacity in the lung bases. A pigtail catheter projects over the pelvis. The top of a left total hip prosthesis is partially visualized. Lower thoracic and lumbar spondylosis noted. IMPRESSION: 1. Feeding tube tip: Stomach body, oriented towards the antrum. 2. Retrocardiac airspace opacity on the left. Electronically Signed   By: Van Clines M.D.   On: 11/11/2021 08:48   DG CHEST PORT 1 VIEW  Result Date: 11/10/2021 CLINICAL DATA:  Hypoxia EXAM: PORTABLE CHEST 1 VIEW COMPARISON:  November 09, 2021 FINDINGS: The heart size and mediastinal contours are stable. Feeding tube is identified unchanged. Increased airspace opacities identified in both lungs,  worsened compared prior exam. Patchy consolidation of bilateral lung bases are noted. The visualized skeletal structures are stable. IMPRESSION: Increased airspace opacities identified in both lungs, worsened compared prior exam. Patchy consolidation of bilateral lung bases. Electronically Signed   By: Abelardo Diesel M.D.   On: 11/10/2021 07:25   ECHOCARDIOGRAM COMPLETE  Result Date: 11/09/2021    ECHOCARDIOGRAM REPORT   Patient Name:   SIDDH VANDEVENTER Date of Exam: 11/09/2021 Medical Rec #:  [id number]       Height:       66.9 in Accession #:    [id number]      Weight:       274.0 lb Date of Birth:  01-19-1956      BSA:          2.310 m Patient Age:    28 years        BP:           131/58 mmHg Patient Gender: M               HR:           71 bpm. Exam Location:  Inpatient Procedure: 2D Echo, Cardiac Doppler, Color Doppler and Intracardiac            Opacification Agent Indications:    CHF  History:        Patient has no prior history of Echocardiogram examinations.  Sonographer:    Joette Catching RCS Referring Phys: [id number] [place]  1. Left ventricular ejection fraction, by estimation, is 60 to 65%. The left ventricle has normal function. The left ventricle has no regional wall motion abnormalities. There is mild concentric left ventricular hypertrophy. Left ventricular diastolic parameters were normal.  2. Right ventricular systolic function is normal. The right ventricular size is mildly enlarged. Tricuspid regurgitation signal is inadequate for assessing PA pressure.  3. The mitral valve is grossly normal. Trivial mitral valve regurgitation. No evidence of mitral stenosis.  4. The aortic valve is tricuspid. There is mild calcification of the aortic valve. Aortic valve regurgitation is not visualized. Aortic valve sclerosis/calcification is present, without any evidence of aortic stenosis.  5. There is mild dilatation of the ascending aorta, measuring 42 mm.  6. The inferior vena cava is  dilated in size with >50% respiratory variability, suggesting right atrial pressure of 8 mmHg. FINDINGS  Left Ventricle: Left ventricular ejection fraction, by estimation, is 60 to 65%. The left ventricle has normal function. The left ventricle has no regional wall motion abnormalities. Definity contrast agent was given IV to delineate the left ventricular  endocardial borders. The left ventricular internal cavity size was normal in size. There is mild concentric left ventricular hypertrophy. Left ventricular diastolic parameters were normal. Right Ventricle: The right ventricular size is mildly enlarged. No increase in right  ventricular wall thickness. Right ventricular systolic function is normal. Tricuspid regurgitation signal is inadequate for assessing PA pressure. Left Atrium: Left atrial size was normal in size. Right Atrium: Right atrial size was normal in size. Pericardium: Trivial pericardial effusion is present. Mitral Valve: The mitral valve is grossly normal. Trivial mitral valve regurgitation. No evidence of mitral valve stenosis. Tricuspid Valve: The tricuspid valve is grossly normal. Tricuspid valve regurgitation is trivial. No evidence of tricuspid stenosis. Aortic Valve: The aortic valve is tricuspid. There is mild calcification of the aortic valve. Aortic valve regurgitation is not visualized. Aortic valve sclerosis/calcification is present, without any evidence of aortic stenosis. Aortic valve mean gradient measures 10.0 mmHg. Aortic valve peak gradient measures 20.2 mmHg. Aortic valve area, by VTI measures 4.20 cm. Pulmonic Valve: The pulmonic valve was grossly normal. Pulmonic valve regurgitation is not visualized. No evidence of pulmonic stenosis. Aorta: The aortic root is normal in size and structure. There is mild dilatation of the ascending aorta, measuring 42 mm. Venous: The inferior vena cava is dilated in size with greater than 50% respiratory variability, suggesting right atrial  pressure of 8 mmHg. IAS/Shunts: The atrial septum is grossly normal.  LEFT VENTRICLE PLAX 2D LVIDd:         4.50 cm   Diastology LVIDs:         3.30 cm   LV e' medial:    7.83 cm/s LV PW:         1.20 cm   LV E/e' medial:  10.0 LV IVS:        1.40 cm   LV e' lateral:   12.20 cm/s LVOT diam:     2.30 cm   LV E/e' lateral: 6.4 LV SV:         160 LV SV Index:   69 LVOT Area:     4.15 cm  RIGHT VENTRICLE             IVC RV Basal diam:  4.70 cm     IVC diam: 2.30 cm RV Mid diam:    4.20 cm RV S prime:     18.60 cm/s TAPSE (M-mode): 2.8 cm LEFT ATRIUM           Index        RIGHT ATRIUM           Index LA diam:      3.40 cm 1.47 cm/m   RA Area:     19.10 cm LA Vol (A2C): 54.2 ml 23.46 ml/m  RA Volume:   57.00 ml  24.68 ml/m LA Vol (A4C): 48.1 ml 20.82 ml/m  AORTIC VALVE                     PULMONIC VALVE AV Area (Vmax):    3.59 cm      PV Vmax:       1.38 m/s AV Area (Vmean):   3.69 cm      PV Peak grad:  7.6 mmHg AV Area (VTI):     4.20 cm AV Vmax:           224.50 cm/s AV Vmean:          148.500 cm/s AV VTI:            0.381 m AV Peak Grad:      20.2 mmHg AV Mean Grad:      10.0 mmHg LVOT Vmax:         194.00 cm/s LVOT Vmean:  132.000 cm/s LVOT VTI:          0.385 m LVOT/AV VTI ratio: 1.01  AORTA Ao Root diam: 3.80 cm Ao Asc diam:  4.20 cm MITRAL VALVE MV Area (PHT): 3.27 cm    SHUNTS MV Decel Time: 232 msec    Systemic VTI:  0.38 m MV E velocity: 78.30 cm/s  Systemic Diam: 2.30 cm MV A velocity: 88.10 cm/s MV E/A ratio:  0.89 Eleonore Chiquito MD Electronically signed by Eleonore Chiquito MD Signature Date/Time: 11/09/2021/12:26:45 PM    Final    DG Chest Port 1 View  Result Date: 11/09/2021 CLINICAL DATA:  Hypoxia. EXAM: PORTABLE CHEST 1 VIEW COMPARISON:  November 08, 2021 FINDINGS: There is stable left internal jugular venous catheter positioning. Interval enteric tube placement is seen without visualization of its distal tip. The heart size and mediastinal contours are within normal limits. Mild diffusely  increased interstitial lung markings are seen with mild perihilar prominence of the pulmonary vasculature. Mild bibasilar atelectasis is also noted. There is no evidence of a pleural effusion or pneumothorax. Multilevel degenerative changes are seen throughout the thoracic spine. IMPRESSION: 1. Mild pulmonary vascular congestion with increased interstitial lung markings, likely consistent with interstitial edema. 2. Mild bibasilar atelectasis. Electronically Signed   By: Virgina Norfolk M.D.   On: 11/09/2021 02:25   DG Abd 1 View  Result Date: 11/09/2021 CLINICAL DATA:  Nasogastric tube placement. EXAM: ABDOMEN - 1 VIEW COMPARISON:  November 08, 2021 FINDINGS: A feeding tube is present with its radiopaque weighted tip overlying the region of the gastric antrum. This represents a new finding. Nonspecific air-filled loops of bowel are seen within the left lower quadrant. No radio-opaque calculi or other significant radiographic abnormality are identified. A percutaneous surgical drain is seen with its distal tip overlying the midline of the pelvis. An intact left hip replacement is noted. IMPRESSION: Interval feeding tube placement and positioning, as described above. Electronically Signed   By: Virgina Norfolk M.D.   On: 11/09/2021 01:11   CT IMAGE GUIDED DRAINAGE BY PERCUTANEOUS CATHETER  Result Date: 11/08/2021 INDICATION: Intra-abdominal and pelvic abscess.  Sepsis. EXAM: CT GUIDED RIGHT TRANSGLUTEAL APPROACH ABSCESS DRAINAGE CATHETER PLACEMENT RADIATION DOSE REDUCTION: This exam was performed according to the departmental dose-optimization program which includes automated exposure control, adjustment of the mA and/or kV according to patient size and/or use of iterative reconstruction technique. COMPARISON:  None Available. MEDICATIONS: The patient is currently admitted to the hospital and receiving intravenous antibiotics. The antibiotics were administered within an appropriate time frame prior to the  initiation of the procedure. ANESTHESIA/SEDATION: Moderate (conscious) sedation was employed during this procedure. A total of Versed 1 mg and Fentanyl 100 mcg was administered intravenously. Moderate Sedation Time: 27 minutes. The patient's level of consciousness and vital signs were monitored continuously by radiology nursing throughout the procedure under my direct supervision. CONTRAST:  None COMPLICATIONS: None immediate. PROCEDURE: Informed written consent was obtained from the patient and/or patient's representative after a discussion of the risks, benefits and alternatives to treatment. The patient was placed prone on the CT gantry and a pre procedural CT was performed re-demonstrating the known abscess/fluid collection within the deep pelvis. The procedure was planned. A timeout was performed prior to the initiation of the procedure. The RIGHT gluteal soft tissue was prepped and draped in the usual sterile fashion. The overlying soft tissues were anesthetized with 1% lidocaine with epinephrine. Appropriate trajectory was planned with the use of a 22 gauge spinal needle. An 51  gauge trocar needle was advanced into the abscess/fluid collection and a short Amplatz super stiff wire was coiled within the collection. Appropriate positioning was confirmed with a limited CT scan. The tract was serially dilated allowing placement of a 12 Fr drainage catheter. Appropriate positioning was confirmed with a limited postprocedural CT scan. Feculent fluid was aspirated. The tube was connected to a drainage bag and sutured in place. A dressing was placed. The patient tolerated the procedure well without immediate post procedural complication. IMPRESSION: Successful CT guided placement of a 12 Fr RIGHT transgluteal approach pelvis drainage catheter, as above. Samples were sent to the laboratory as requested by the ordering clinical team. Michaelle Birks, MD [facility]  Electronically Signed   By: Michaelle Birks M.D.   On: 11/08/2021 17:30   DG CHEST PORT 1 VIEW  Result Date: 11/08/2021 CLINICAL DATA:  Encounter for central line placement. EXAM: PORTABLE CHEST 1 VIEW COMPARISON:  AP chest 11/08/2021 at 6:40 a.m.; chest two views 08/28/2007 FINDINGS: AP chest 11/08/2021 at 8:35 a.m. New left internal jugular central venous catheter with tip overlying the mid to central aspect of the superior vena cava, in appropriate position. Cardiac silhouette is again within normal size limits. Mediastinal contours are within normal limits. Mild bilateral lower lung interstitial thickening is unchanged. No definite pleural effusion. No pneumothorax. No acute skeletal abnormality. IMPRESSION: 1. New left internal jugular central venous catheter tip in appropriate position. 2. Likely mild interstitial pulmonary edema. Electronically Signed   By: Yvonne Kendall M.D.   On: 11/08/2021 08:45   DG CHEST PORT 1 VIEW  Result Date: 11/08/2021 CLINICAL DATA:  Hypoxia, labored breathing. EXAM: PORTABLE CHEST 1 VIEW COMPARISON:  August 28, 2007 FINDINGS: The heart size and mediastinal contours are stable. The heart size enlarged. Mild increased pulmonary interstitium is identified bilaterally. Mild patchy consolidation of bilateral lung bases are noted. The visualized skeletal structures are stable. IMPRESSION: 1. Mild congestive heart failure. 2. Mild patchy consolidation of bilateral lung bases, pneumonia is not excluded. Electronically Signed   By: Abelardo Diesel M.D.   On: 11/08/2021 06:53   DG Abd 1 View  Result Date: 11/08/2021 CLINICAL DATA:  Intra-abdominal abscess and diarrhea. EXAM: ABDOMEN - 1 VIEW COMPARISON:  CT yesterday. FINDINGS: Supine view of the central abdomen which excludes both flanks. Air filled structure in the midline pelvis likely corresponds to abscess on CT. The additional fluid collections on CT are not well seen by radiograph. Soft tissue attenuation from habitus limits  assessment. IMPRESSION: Air filled structure in the midline pelvis likely corresponds to abscess with air-fluid level on yesterday's CT. The additional fluid collections on CT are not well seen by radiograph. Electronically Signed   By: Keith Rake M.D.   On: 11/08/2021 02:00    Microbiology: Results for orders placed or performed during the hospital encounter of 11/07/21  Culture, blood (routine x 2)     Status: None   Collection Time: 11/07/21  3:10 PM   Specimen: BLOOD RIGHT FOREARM  Result Value Ref Range Status   Specimen Description   Final    BLOOD RIGHT FOREARM Performed at [facility], [address] [place], [place] [address]    Special Requests   Final    BOTTLES DRAWN AEROBIC AND ANAEROBIC Blood Culture adequate volume Performed at [facility], [address] [place], [place] [address]    Culture   Final    NO GROWTH 5 DAYS Performed at [facility], [place] [address], [place], [place] [address]    Report Status 11/12/2021 FINAL  Final  Culture, blood (routine x 2)     Status: None   Collection Time: 11/07/21  3:12 PM   Specimen: BLOOD LEFT FOREARM  Result Value Ref Range Status   Specimen Description   Final    BLOOD LEFT FOREARM Performed at [facility], [address], [place], [place] [address]    Special Requests   Final    BOTTLES DRAWN AEROBIC AND ANAEROBIC Blood Culture adequate volume Performed at [facility], [address] [place], [place] [address]    Culture   Final    NO GROWTH 5 DAYS Performed at [facility], [place] [address], [place], [place] [address]    Report Status 11/12/2021 FINAL  Final  Surgical PCR screen     Status: None   Collection Time: 11/08/21  2:06 AM   Specimen: Nasal Mucosa; Nasal Swab  Result Value Ref Range Status   MRSA, PCR NEGATIVE NEGATIVE Final   Staphylococcus aureus NEGATIVE NEGATIVE Final    Comment: (NOTE) The Xpert SA  Assay (FDA approved for NASAL specimens in patients 74 years of age and older), is one component of a comprehensive surveillance program. It is not intended to diagnose infection nor to guide or monitor treatment. Performed at [facility], [place] [address] [place], [place] [address]   Aerobic/Anaerobic Culture w Gram Stain (surgical/deep wound)     Status: None   Collection Time: 11/08/21  1:25 PM   Specimen: Abscess  Result Value Ref Range Status   Specimen Description   Final    ABSCESS DEEP PELVIC DRAIN Performed at [place] [address] [place], [place] [address]    Special Requests   Final    NONE Performed at [facility], [place] [address], [place], [place] [address]    Gram Stain   Final    RARE WBC PRESENT,BOTH PMN AND MONONUCLEAR ABUNDANT GRAM POSITIVE COCCI IN PAIRS MODERATE GRAM NEGATIVE RODS RARE GRAM POSITIVE RODS Performed at [facility], [place] [address] [place], [place] [address]    Culture   Final    MODERATE ESCHERICHIA COLI MIXED ANAEROBIC FLORA PRESENT.  CALL LAB IF FURTHER IID REQUIRED.    Report Status 11/11/2021 FINAL  Final   Organism ID, Bacteria ESCHERICHIA COLI  Final      Susceptibility   Escherichia coli - MIC*    AMPICILLIN 4 SENSITIVE Sensitive     CEFAZOLIN <=4 SENSITIVE Sensitive     CEFEPIME <=0.12 SENSITIVE Sensitive     CEFTAZIDIME <=1 SENSITIVE Sensitive     CEFTRIAXONE <=0.25 SENSITIVE Sensitive     CIPROFLOXACIN <=0.25 SENSITIVE Sensitive     GENTAMICIN <=1 SENSITIVE Sensitive     IMIPENEM <=0.25 SENSITIVE Sensitive     TRIMETH/SULFA <=20 SENSITIVE Sensitive     AMPICILLIN/SULBACTAM <=2 SENSITIVE Sensitive     PIP/TAZO <=4 SENSITIVE Sensitive     * MODERATE ESCHERICHIA COLI  Aerobic/Anaerobic Culture w Gram Stain (surgical/deep wound)     Status: None   Collection Time: 11/18/21  4:08 PM   Specimen: Abscess  Result Value Ref Range Status   Specimen Description  ABSCESS ABDOMEN  Final   Special Requests LEFT LATERAL ABDOMEN DRAIN NO 2  Final   Gram Stain   Final    NO SQUAMOUS EPITHELIAL CELLS SEEN ABUNDANT WBC SEEN NO ORGANISMS SEEN  Culture   Final    RARE CANDIDA TROPICALIS NO ANAEROBES ISOLATED SEE SEPARATE REPORT Performed at [facility], [address], [place], [place] [address]    Report Status 11/29/2021 FINAL  Final  Aerobic/Anaerobic Culture w Gram Stain (surgical/deep wound)     Status: None   Collection Time: 11/18/21  4:08 PM   Specimen: Abscess  Result Value Ref Range Status   Specimen Description ABSCESS ABDOMEN  Final   Special Requests RT LOWER QUAD DRAIN NO 1  Final   Gram Stain   Final    NO SQUAMOUS EPITHELIAL CELLS SEEN MODERATE WBC SEEN NO ORGANISMS SEEN    Culture   Final    No growth aerobically or anaerobically. Performed at [facility], [place] [address], [place], [place] [address]    Report Status 11/23/2021 FINAL  Final  Antifungal AST 9 Drug Panel     Status: None   Collection Time: 11/18/21  4:08 PM  Result Value Ref Range Status   Organism ID, Yeast Candida tropicalis  Corrected    Comment: (NOTE) Identification performed by account, not confirmed by this laboratory. CORRECTED ON 06/24 AT 1436: PREVIOUSLY REPORTED AS Preliminary report    Amphotericin B MIC 0.5 ug/mL  Final    Comment: (NOTE) Breakpoints have been established for only some organism-drug combinations as indicated. This test was developed and its performance characteristics determined by [facility]. It has not been cleared or approved by the Food and Drug Administration.    Anidulafungin MIC Comment  Final    Comment: (NOTE) 0.25 ug/mL Susceptible Breakpoints have been established for only some organism-drug combinations as indicated. This test was developed and its performance characteristics determined by [facility]. It has not been cleared or approved by the Food and Drug Administration.    Caspofungin MIC  Comment  Final    Comment: (NOTE) 0.25 ug/mL Susceptible Breakpoints have been established for only some organism-drug combinations as indicated. This test was developed and its performance characteristics determined by [facility]. It has not been cleared or approved by the Food and Drug Administration.    Micafungin MIC Comment  Final    Comment: (NOTE) 0.06 ug/mL Susceptible Breakpoints have been established for only some organism-drug combinations as indicated. This test was developed and its performance characteristics determined by [facility]. It has not been cleared or approved by the Food and Drug Administration.    Posaconazole MIC 1.0 ug/mL  Final    Comment: (NOTE) Breakpoints have been established for only some organism-drug combinations as indicated. This test was developed and its performance characteristics determined by [facility]. It has not been cleared or approved by the Food and Drug Administration.    Fluconazole Islt MIC 8.0 ug/mL Resistant  Final    Comment: (NOTE) Breakpoints have been established for only some organism-drug combinations as indicated. This test was developed and its performance characteristics determined by [facility]. It has not been cleared or approved by the Food and Drug Administration.    Flucytosine MIC 0.12 ug/mL  Final    Comment: (NOTE) Breakpoints have been established for only some organism-drug combinations as indicated. This test was developed and its performance characteristics determined by [facility]. It has not been cleared or approved by the Food and Drug Administration.    Itraconazole MIC 0.5 ug/mL  Final    Comment: (NOTE) Breakpoints have been established for only some organism-drug combinations as indicated. This test was developed and its performance characteristics determined by [facility]. It has not been  cleared or approved by the Food and Drug Administration.    Voriconazole MIC 1.0 ug/mL Resistant  Final    Comment:  (NOTE) Breakpoints have been established for only some organism-drug combinations as indicated. This test was developed and its performance characteristics determined by [facility]. It has not been cleared or approved by the Food and Drug Administration. Performed At: [facility] [place], [place] [id number] Rush Farmer MD [id number]    Source ABSCESS/ CANDIDA TROPICALIS  Final    Comment: Performed at [facility], [place] [address] [place], [place] [address]  Culture, blood (Routine X 2) w Reflex to ID Panel     Status: None   Collection Time: 11/27/21  2:09 AM   Specimen: BLOOD  Result Value Ref Range Status   Specimen Description   Final    BLOOD LEFT ANTECUBITAL Performed at [place] [address], [place], [place] [address]    Special Requests   Final    BOTTLES DRAWN AEROBIC AND ANAEROBIC Blood Culture adequate volume Performed at [place] [address] [place], [place] [address]    Culture   Final    NO GROWTH 5 DAYS Performed at [facility], [place] [address] [place], [place] [address]    Report Status 12/02/2021 FINAL  Final  Culture, blood (Routine X 2) w Reflex to ID Panel     Status: None   Collection Time: 11/27/21  2:11 AM   Specimen: BLOOD  Result Value Ref Range Status   Specimen Description   Final    BLOOD BLOOD LEFT HAND Performed at [place] [address], [place], [place] [address]    Special Requests   Final    BOTTLES DRAWN AEROBIC AND ANAEROBIC Blood Culture adequate volume Performed at [place] [address], [place], [place] [address]    Culture   Final    NO GROWTH 5 DAYS Performed at [facility], [place] [address], [place], [place] [address]    Report Status 12/02/2021 FINAL  Final  Aerobic/Anaerobic Culture w Gram Stain (surgical/deep wound)     Status: None   Collection Time: 11/27/21  1:55 PM    Specimen: Abscess  Result Value Ref Range Status   Specimen Description   Final    ABSCESS Performed at [place] [address] [place], [place] [address]    Special Requests   Final    Normal Performed at [facility], [place] [address], [place], [place] [address]    Gram Stain   Final    ABUNDANT WBC PRESENT, PREDOMINANTLY PMN RARE GRAM NEGATIVE RODS    Culture   Final    FEW ESCHERICHIA COLI MODERATE CANDIDA ALBICANS FEW BACTEROIDES THETAIOTAOMICRON BETA LACTAMASE POSITIVE Performed at [facility], [place] [address] [place],  [address]    Report Status 12/01/2021 FINAL  Final   Organism ID, Bacteria ESCHERICHIA COLI  Final      Susceptibility   Escherichia coli - MIC*    AMPICILLIN 4 SENSITIVE Sensitive     CEFAZOLIN <=4 SENSITIVE Sensitive     CEFEPIME <=0.12 SENSITIVE Sensitive     CEFTAZIDIME <=1 SENSITIVE Sensitive     CEFTRIAXONE <=0.25 SENSITIVE Sensitive     CIPROFLOXACIN <=0.25 SENSITIVE Sensitive     GENTAMICIN <=1 SENSITIVE Sensitive     IMIPENEM <=0.25 SENSITIVE Sensitive     TRIMETH/SULFA <=20 SENSITIVE Sensitive  AMPICILLIN/SULBACTAM <=2 SENSITIVE Sensitive     PIP/TAZO <=4 SENSITIVE Sensitive     * FEW ESCHERICHIA COLI    Labs: CBC: Recent Labs  Lab 12/01/21 1130 12/01/21 1326 12/02/21 1131 12/03/21 0340  WBC 8.1 9.1 9.9 8.9  HGB 7.0* 8.4* 9.1* 8.6*  HCT 22.5* 26.9* 29.0* 27.7*  MCV 102.7* 102.3* 102.1* 101.1*  PLT 165 198 264 711   Basic Metabolic Panel: Recent Labs  Lab 12/01/21 0436 12/02/21 0341 12/03/21 0340 12/04/21 0335 12/05/21 0340 12/06/21 0404  NA 136 136 135 136 134* 135  K 4.9 4.8 4.5 4.1 4.0 4.4  CL 107 107 106 107 107 107  CO2 _0 21* 21*  GLUCOSE 119* 122* 115* 113* 118* 90  BUN 25* 23 28* 26* 27* 26*  CREATININE 0.95 1.12 1.15 1.32* 1.32* 1.32*  CALCIUM 8.7* 8.8* 8.7* 8.9 8.7* 9.0  MG  --  1.9 1.9 1.9  --  1.8  PHOS 3.3 3.6 3.9 3.8  --  3.4   Liver  Function Tests: Recent Labs  Lab 12/01/21 0436 12/02/21 0341 12/06/21 0404  AST _1 ALT _2 ALKPHOS 39 39 40  BILITOT 0.5 0.6 0.5  PROT 6.9 7.1 7.6  ALBUMIN 1.8* 1.8* 2.1*   CBG: Recent Labs  Lab 12/06/21 1137 12/06/21 1713 12/07/21 0030 12/07/21 0707 12/07/21 1106  GLUCAP 94 115* 102* 82 95    Discharge time spent: approximately 20 minutes spent on discharge counseling, evaluation of patient on day of discharge, and coordination of discharge planning with nursing, social work, pharmacy and case management  Signed: Edwin Dada, MD [facility] 12/07/2021

## 2021-12-07 NOTE — Progress Notes (Signed)
Assessment & Plan: POD#13 - s/p exploratory laparotomy and drainage of pelvic abscess - Dr. Donne Hazel - S/p IR drainage 6/24 of LLQ fluid collection - regular diet - right JP drain with small serosanguinous - can be removed prior to discharge  - abx's per [facility] - family planning on home IV abx therapy (already delivered) - VAC M/W/F. WOCN following - trying to arrange for home [facility] therapy - PT/OT for mobilization - TOC consult - HH PT/RN and home vac  - Appreciate TRH and IR assistance with his care.   Follow up with Dr. Donne Hazel at [place] office.   FEN - Soft diet, shakes VTE - SCDs, subq heparin ID - Zosyn and micafungin    ABL anemia - stable A. Fib with RVR - resolved, lopressor.  AKI - resolved Acute respiratory failure with hypoxia - improved. 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  Waiting for breakfast.  Daughter at bedside.  Objective: Vital signs in last 24 hours: Temp:  [97.9 F (36.6 C)-98.5 F (36.9 C)] 98.3 F (36.8 C) (07/04 0542) Pulse Rate:  [68-93] 93 (07/04 0542) Resp:  [17-20] 18 (07/04 0542) BP: (119-125)/(73-78) 125/78 (07/04 0542) SpO2:  [97 %-99 %] 97 % (07/04 0542) Weight:  [108.3 kg-110.5 kg] 108.3 kg (07/04 0500) Last BM Date : 12/05/21  Intake/Output from previous day: 07/03 0701 - 07/04 0700 In: 705.7 [P.O.:360; I.V.:70; IV Piggyback:265.7] Out: 2022 [Urine:1600; Drains:422] Intake/Output this shift: Total I/O In: -  Out: 28 [Drains:28]  Physical Exam: HEENT - sclerae clear, mucous membranes moist Abdomen - soft, VAC dressing intact with seal; left IR drain cloudy, small output; right JP drain with thin  serous output, small Neuro - alert & oriented, no focal deficits  Lab Results:  No results for input(s): "WBC", "HGB", "HCT", "PLT" in the last 72 hours. BMET Recent Labs    12/05/21 0340 12/06/21 0404  NA 134* 135  K 4.0 4.4  CL 107 107  CO2 21* 21*  GLUCOSE 118* 90  BUN 27* 26*  CREATININE 1.32* 1.32*  CALCIUM 8.7* 9.0   PT/INR No results for input(s): "LABPROT", "INR" in the last 72 hours. Comprehensive Metabolic Panel:    Component Value Date/Time   NA 135 12/06/2021 0404   NA 134 (L) 12/05/2021 0340   NA 133 (A) 06/24/2021 0000   K 4.4 12/06/2021 0404   K 4.0 12/05/2021 0340   CL 107 12/06/2021 0404   CL 107 12/05/2021 0340   CO2 21 (L) 12/06/2021 0404   CO2 21 (L) 12/05/2021 0340   BUN 26 (H) 12/06/2021 0404   BUN 27 (H) 12/05/2021 0340   BUN 18 06/24/2021 0000   CREATININE 1.32 (H) 12/06/2021 0404   CREATININE 1.32 (H) 12/05/2021 0340   GLUCOSE 90 12/06/2021 0404   GLUCOSE 118 (H) 12/05/2021 0340   CALCIUM 9.0 12/06/2021 0404   CALCIUM 8.7 (L) 12/05/2021 0340   AST 28 12/06/2021 0404   AST 31 12/02/2021 0341   ALT 14 12/06/2021 0404   ALT 17 12/02/2021 0341   ALKPHOS 40 12/06/2021 0404   ALKPHOS 39 12/02/2021 0341   BILITOT 0.5 12/06/2021 0404  BILITOT 0.6 12/02/2021 0341   PROT 7.6 12/06/2021 0404   PROT 7.1 12/02/2021 0341   ALBUMIN 2.1 (L) 12/06/2021 0404   ALBUMIN 1.8 (L) 12/02/2021 0341    Studies/Results: No results found.    Armandina Gemma 12/07/2021   Patient ID: Robert Larson, male   DOB: Mar 13, 1956, 66 y.o.   MRN: [id number]

## 2021-12-07 NOTE — Progress Notes (Signed)
Negative pressure wound therapy (VAC) discontinued per MD order and moist to dry dressing applied to midline incision/wound. Saline moistened kerlix used to pack wound, covered with 4x4s and ABD pad, secured with tape. Pts daughter at bedside and demonstrated wound care to her since she will be the primary caregiver at home. Daughter verbalized understanding and all questions answered. Sabas Frett, Laurel Dimmer, RN

## 2021-12-07 NOTE — Plan of Care (Signed)
  Problem: Clinical Measurements: Goal: Ability to maintain clinical measurements within normal limits will improve Outcome: Adequate for Discharge Goal: Diagnostic test results will improve Outcome: Adequate for Discharge

## 2021-12-07 NOTE — Plan of Care (Signed)
  Problem: Clinical Measurements: Goal: Ability to maintain clinical measurements within normal limits will improve 12/07/2021 1752 by Margarette Canada, RN Outcome: Adequate for Discharge 12/07/2021 1646 by Margarette Canada, RN Outcome: Progressing Goal: Will remain free from infection Outcome: Adequate for Discharge Goal: Diagnostic test results will improve Outcome: Adequate for Discharge Goal: Respiratory complications will improve Outcome: Adequate for Discharge Goal: Cardiovascular complication will be avoided Outcome: Adequate for Discharge   Problem: Activity: Goal: Risk for activity intolerance will decrease 12/07/2021 1752 by Margarette Canada, RN Outcome: Adequate for Discharge 12/07/2021 1646 by Margarette Canada, RN Outcome: Progressing   Problem: Coping: Goal: Level of anxiety will decrease 12/07/2021 1752 by Margarette Canada, RN Outcome: Adequate for Discharge 12/07/2021 1646 by Margarette Canada, RN Outcome: Progressing   Problem: Elimination: Goal: Will not experience complications related to bowel motility Outcome: Adequate for Discharge Goal: Will not experience complications related to urinary retention Outcome: Adequate for Discharge   Problem: Pain Managment: Goal: General experience of comfort will improve Outcome: Adequate for Discharge   Problem: Safety: Goal: Ability to remain free from injury will improve Outcome: Adequate for Discharge   Problem: Skin Integrity: Goal: Risk for impaired skin integrity will decrease Outcome: Adequate for Discharge   Problem: Acute Rehab OT Goals (only OT should resolve) Goal: Pt. Will Perform Grooming Outcome: Adequate for Discharge Goal: Pt. Will Perform Lower Body Dressing Outcome: Adequate for Discharge Goal: Pt. Will Transfer To Toilet Outcome: Adequate for Discharge Goal: Pt. Will Perform Toileting-Clothing Manipulation Outcome: Adequate for Discharge Goal: OT Additional ADL Goal #1 Outcome: Adequate for Discharge   Problem:  Increased Nutrient Needs (NI-5.1) Goal: Food and/or nutrient delivery Description: Individualized approach for food/nutrient provision. Outcome: Adequate for Discharge   Problem: Acute Rehab PT Goals(only PT should resolve) Goal: Pt Will Go Supine/Side To Sit Outcome: Adequate for Discharge Goal: Patient Will Transfer Sit To/From Stand Outcome: Adequate for Discharge Goal: Pt Will Ambulate Outcome: Adequate for Discharge

## 2021-12-07 NOTE — Progress Notes (Signed)
OT Cancellation Note  Patient Details Name: Robert Larson MRN: [id number] DOB: August 10, 1955   Cancelled Treatment:    Reason Eval/Treat Not Completed: Other (comment) Patient and family in room reporting that patient pending d/c today with nursing to complete dressing change. Patient and family endorse being able to complete ADLs at home with support and that are familiar with AE from previous surgery.  [name] OTR/L, [place] [facility] Department Office# [phone] Pager# [phone]  12/07/2021, 2:51 PM

## 2021-12-08 ENCOUNTER — Other Ambulatory Visit: Payer: Self-pay | Admitting: General Surgery

## 2021-12-08 DIAGNOSIS — K651 Peritoneal abscess: Secondary | ICD-10-CM

## 2021-12-08 NOTE — TOC Transition Note (Signed)
Transition of Care [facility]) - CM/SW Discharge Note   Patient Details  Name: Robert Larson MRN: [id number] Date of Birth: 18-Nov-1955  [facility] CM/SW Contact:  Leeroy Cha, RN Phone Number: 12/08/2021, 12:58 PM   Clinical Narrative:    Discharge summary faxed to [employer] for [employer] insurance per request.   Final next level of care: [place] Barriers to Discharge: Continued Medical Work up   Patient Goals and CMS Choice Patient states their goals for this hospitalization and ongoing recovery are:: Home      Discharge Placement                       Discharge Plan and Services                                     Social Determinants of Health (SDOH) Interventions     Readmission Risk Interventions    12/06/2021    4:35 PM  Readmission Risk Prevention Plan  Transportation Screening Complete  Medication Review ([place]) Complete  PCP or Specialist appointment within 3-5 days of discharge Complete  HRI or [place] Complete  SW Recovery Care/Counseling Consult Complete  [place] Not Applicable

## 2021-12-10 ENCOUNTER — Ambulatory Visit (INDEPENDENT_AMBULATORY_CARE_PROVIDER_SITE_OTHER): Payer: Commercial Managed Care - HMO | Admitting: Family

## 2021-12-10 ENCOUNTER — Encounter: Payer: Self-pay | Admitting: Family

## 2021-12-10 VITALS — BP 110/58 | HR 62 | Temp 98.0°F | Ht 66.0 in | Wt 243.0 lb

## 2021-12-10 DIAGNOSIS — F17210 Nicotine dependence, cigarettes, uncomplicated: Secondary | ICD-10-CM | POA: Diagnosis not present

## 2021-12-10 DIAGNOSIS — R932 Abnormal findings on diagnostic imaging of liver and biliary tract: Secondary | ICD-10-CM

## 2021-12-10 DIAGNOSIS — R531 Weakness: Secondary | ICD-10-CM

## 2021-12-10 DIAGNOSIS — Z09 Encounter for follow-up examination after completed treatment for conditions other than malignant neoplasm: Secondary | ICD-10-CM

## 2021-12-10 DIAGNOSIS — I480 Paroxysmal atrial fibrillation: Secondary | ICD-10-CM

## 2021-12-10 DIAGNOSIS — K651 Peritoneal abscess: Secondary | ICD-10-CM | POA: Diagnosis not present

## 2021-12-10 DIAGNOSIS — I10 Essential (primary) hypertension: Secondary | ICD-10-CM

## 2021-12-10 DIAGNOSIS — Z9889 Other specified postprocedural states: Secondary | ICD-10-CM | POA: Diagnosis not present

## 2021-12-10 NOTE — Patient Instructions (Signed)
It was very nice to see you today!    So glad you are finally home!  Continue all meds as you are taking.  Ask your surgeon to order home health or outpatient wound center for wound care, But let me know if I need to help with any orders for wound care or physical therapy.  Continue the wet to dry dressings daily, take a picture when you can and upload to MyChart so we can have it in your record.  Stop the Mirtazapine med at bedtime that is increasing your appetite once you start gaining weight again and eating normally.  It is better for your overall recovery to keep the weight off!  Follow up with me as needed for refills.  Have a great weekend!      PLEASE NOTE:  If you had any lab tests please let us know if you have not heard back within a few days. You may see your results on MyChart before we have a chance to review them but we will give you a call once they are reviewed by [place]. If we ordered any referrals today, please let us know if you have not heard from their office within the next week.

## 2021-12-10 NOTE — Assessment & Plan Note (Signed)
Chronic - d/c from hospital, had up & down BP problems, developed some A-fib from hospital records. Started on Amlodipine and continued Metoprolol. Stable today, f/u in 3 mos.

## 2021-12-10 NOTE — Assessment & Plan Note (Addendum)
hospital d/c 12/07/2021 - possible mass; gallstones; possible cirrhosis; needs f/u imaging, MRI, when stable

## 2021-12-10 NOTE — Assessment & Plan Note (Addendum)
Complete cessation 11/07/2021 upon hospital admission per pt and dtr.

## 2021-12-10 NOTE — Assessment & Plan Note (Signed)
New - found during hospital stay - has f/u w/cardiology to get on 24h heart monitor; taking Metoprolol qd.

## 2021-12-10 NOTE — Progress Notes (Signed)
Patient ID: Robert Larson, male    DOB: 01-Nov-1955, 66 y.o.   MRN: [id number]  Chief Complaint  Patient presents with   Follow-up    Pt was seen in ED on 6/4 for Abdominal pain, Pt was in surgery and more abscesses were found. Pt has drains, picc line. Just discharged on 7/4, was in hospital for 30 days. Pt states he is having abdominal pain from the incision.    *Patient is accompanied by their daughter today, who is helping to provide HPI/medical information.   HPI: Follow up Hospitalization: Patient was admitted to [facility] on 11/07/2021 and discharged on 12/07/2021. He was treated for abd abscess. Treatment for this included surgery, IV abt. Telephone follow up was done on 12/08/2021. He reports good compliance with treatment. He reports this condition is improved.  Several abdominal abscesses - requiring surgery/drainage - done on 6/20 - no colectomy, will require long term antibiotic, pt has PICC line. Has midline wound, had wound vac, but unable to find nursing qualified to do at home. pt dtr is doing wet to dry changes qd. has f/u with surgeon in 1 week, abt for 8 weeks, has f/u with ID. Several meds were stopped and new ones started. Hypertension: Patient is currently maintained on the following medications for blood pressure: Amlodipine, Metoprolol. New PAF during hospital stay, has seen Cardiology, ordering heart monitoring. Failed meds include: Telmisartan-HCTZ stopped Patient reports good compliance with blood pressure medications. Patient denies chest pain, headaches, shortness of breath or swelling. Last 3 blood pressure readings in our office are as follows: BP Readings from Last 3 Encounters:  12/10/21 (!) 110/58  12/07/21 119/66  10/14/21 (!) 155/81    Assessment & Plan:   Problem List Items Addressed This Visit       Cardiovascular and Mediastinum   Essential hypertension    Chronic - d/c from hospital, had up & down BP problems, developed some A-fib from hospital  records. Started on Amlodipine and continued Metoprolol. Stable today, f/u in 3 mos.      Paroxysmal atrial fibrillation with RVR ([place])    New - found during hospital stay - has f/u w/cardiology to get on 24h heart monitor; taking Metoprolol qd.        Other   Cigarette nicotine dependence without complication    Complete cessation 11/07/2021 upon hospital admission per pt and dtr.      Intra-abdominal abscess [facility]) [facility] stay for 30d - S/p exploratory laparotomy and drainage of pelvic abscess by Dr. Donne Hazel on 6/20; d/c w/PICC & IV Zosyn, micafungin, plan for 3 more weeks therapy, f/u with ID.      Abnormal findings on diagnostic imaging of liver    hospital d/c 12/07/2021 - possible mass; gallstones; possible cirrhosis; needs f/u imaging, MRI, when stable      Other Visit Diagnoses     S/P exploratory laparotomy    - pt has approx. 10in vertical abdominal wound about 3in depth, dtr is performing wet to dry dressing changes qd; pt had wound vac in hospital, but no HH agency have nurses available to perform dressing changes. pt has surgical f/u in 1 week, dtr will ask about outpatient wound center.    Hospital discharge follow-up    - see above DX, 6/4-12/07/2021 for intraabdominal abscesses requiring surgery, complications included sepsis, resp. failure, ARF, & PAF, received TPN.      Generalized weakness - d/t long hospitalization, PT worked with him last  couple of days of hospital stay; needs  home PT, dtr is trying to get ordered. Rolling walker was ordered for home. Pt in w/c today, can only walk very short distances now. Advised dtr to let me know if I can order anything.       Subjective:    Outpatient Medications Prior to Visit  Medication Sig Dispense Refill   amLODipine (NORVASC) 10 MG tablet Take 1 tablet (10 mg total) by mouth daily. 30 tablet 3   ascorbic acid (VITAMIN C) 500 MG tablet Take 1 tablet (500 mg total) by mouth daily.     caspofungin  (CANCIDAS) IVPB Inject 70 mg into the vein daily for 23 days. Indication:  Intra-abdominal infection/abscesses  First Dose: Yes Last Day of Therapy:  12/25/2021  Labs - Once weekly:  CBC/D and BMP, Labs - Every other week:  ESR and CRP Method of administration: Elastomeric Method of administration may be changed at the discretion of home infusion pharmacist based upon assessment of the patient and/or caregiver's ability to self-administer the medication ordered. 23 Units 0   ertapenem (INVANZ) IVPB Inject 1 g into the vein daily for 23 days. Indication:  Intra-abdominal infection/abscesses  First Dose: Yes Last Day of Therapy:  12/25/2021  Labs - Once weekly:  CBC/D and BMP, Labs - Every other week:  ESR and CRP Method of administration: Mini-Bag Plus / Gravity Method of administration may be changed at the discretion of home infusion pharmacist based upon assessment of the patient and/or caregiver's ability to self-administer the medication ordered. 23 Units 0   metoprolol succinate (TOPROL-XL) 25 MG 24 hr tablet Take 1 tablet (25 mg total) by mouth at bedtime. START with 1/2 pill for one week, then increase to 1 full pill. (Patient taking differently: Take 25 mg by mouth at bedtime.) 30 tablet 2   mirtazapine (REMERON) 7.5 MG tablet Take 1 tablet (7.5 mg total) by mouth at bedtime. 30 tablet 3   oxyCODONE (OXY IR/ROXICODONE) 5 MG immediate release tablet Take 1 tablet (5 mg total) by mouth every 6 (six) hours as needed for severe pain. 15 tablet 0   Nutritional Supplements (,FEEDING SUPPLEMENT, PROSOURCE PLUS) liquid Take 30 mLs by mouth 3 (three) times daily between meals.     zinc sulfate 220 (50 Zn) MG capsule Take 1 capsule (220 mg total) by mouth daily.     Facility-Administered Medications Prior to Visit  Medication Dose Route Frequency Provider Last Rate Last Admin   0.9 %  sodium chloride infusion  500 mL Intravenous Once Irene Shipper, MD       Past Medical History:  Diagnosis Date    Abscess    Acute blood loss anemia 10/07/6642   Acute metabolic encephalopathy 0/08/4740   Acute renal failure superimposed on stage 3a chronic kidney disease ([place]) 11/07/2021   Acute respiratory failure ([place]) 11/29/2021   Acute right-sided low back pain with bilateral sciatica 03/13/2017   Alteration in electrolyte and fluid balance 11/29/2021   Coffee ground emesis 11/08/2021   Contusion of left hip and thigh 02/13/2017   Hyponatremia 11/07/2021   Osteoarthritis    Pressure injury of skin 11/28/2021   Protein in urine    Septic shock ([place]) 11/08/2021   Sleep apnea    Tobacco abuse 11/08/2021   Past Surgical History:  Procedure Laterality Date   CYSTOSCOPY WITH STENT PLACEMENT Bilateral 11/23/2021   Procedure: CYSTOSCOPY WITH STENT PLACEMENT;  Surgeon: Lucas Mallow, MD;  Location: [facility];  Service: Urology;  Laterality: Bilateral;   LAPAROTOMY N/A 11/23/2021   Procedure: EXPLORATORY LAPAROTOMY, PARTIAL COLECTOMY AND COLOSTOMY;  Surgeon: Rolm Bookbinder, MD;  Location: [facility];  Service: General;  Laterality: N/A;   REPLACEMENT TOTAL HIP W/  RESURFACING IMPLANTS     Left    No Known Allergies    Objective:    Physical Exam Vitals and nursing note reviewed.  Constitutional:      General: He is not in acute distress.    Appearance: Normal appearance. He is obese.  HENT:     Head: Normocephalic.  Cardiovascular:     Rate and Rhythm: Normal rate and regular rhythm.  Pulmonary:     Effort: Pulmonary effort is normal.     Breath sounds: Normal breath sounds.  Abdominal:     Tenderness: There is generalized abdominal tenderness.     Comments: wound dressing intact midline abdomen  Musculoskeletal:        General: Normal range of motion.     Cervical back: Normal range of motion.  Skin:    General: Skin is warm and dry.     Comments: PICC line in right upper arm, dressing clean ,dry intact.  Neurological:     Mental Status: He is alert and oriented to person, place, and time.      Gait: Gait abnormal (pt in w/c, can only stand & walk a few steps before tiring out).  Psychiatric:        Mood and Affect: Mood normal.    BP (!) 110/58 (BP Location: Left Arm, Patient Position: Sitting, Cuff Size: Large)   Pulse 62   Temp 98 F (36.7 C) (Temporal)   Ht _0  (1.676 m)   Wt 243 lb (110.2 kg)   SpO2 98%   BMI 39.22 kg/m  Wt Readings from Last 3 Encounters:  12/10/21 243 lb (110.2 kg)  12/07/21 238 lb 12.1 oz (108.3 kg)  10/14/21 284 lb 6 oz (129 kg)    Jeanie Sewer, NP

## 2021-12-10 NOTE — Assessment & Plan Note (Signed)
Hospital stay for 30d - S/p exploratory laparotomy and drainage of pelvic abscessby Dr. Donne Hazel on 6/20; d/c w/PICC & IV Zosyn, micafungin, plan for 3 more weeks therapy, f/u with ID.

## 2021-12-12 ENCOUNTER — Ambulatory Visit (INDEPENDENT_AMBULATORY_CARE_PROVIDER_SITE_OTHER): Payer: Commercial Managed Care - HMO

## 2021-12-12 DIAGNOSIS — R002 Palpitations: Secondary | ICD-10-CM

## 2021-12-12 DIAGNOSIS — I480 Paroxysmal atrial fibrillation: Secondary | ICD-10-CM

## 2021-12-14 ENCOUNTER — Telehealth: Payer: Self-pay | Admitting: Family

## 2021-12-14 NOTE — Telephone Encounter (Signed)
Milus Height from [employer] called on behalf of Christella Scheuermann stating she was unable to find home health services for the patient following his hospital discharge. States that despite this, Francis Gaines, the patient's daughter, will be calling soon to inquire about get an outpatient PT referral. If more info is needed, Clarene Critchley may be reached at [phone], option 7, extension [phone].

## 2021-12-20 ENCOUNTER — Telehealth: Payer: Self-pay | Admitting: Family

## 2021-12-20 NOTE — Telephone Encounter (Signed)
Pt's daughter states pt is needing a new sleep apnea machine. This was prescribed previously by another provider. She is wanting [facility] to take this over. Please advise

## 2021-12-20 NOTE — Telephone Encounter (Signed)
How old is his machine? Was it ordered by pulmonologist? was it here in [place] or out of town? Depending how old, he may require a new eval. She should call the company listed on the machine first and ask if I can send order for new machine or if a new eval is needed.

## 2021-12-21 ENCOUNTER — Other Ambulatory Visit: Payer: Self-pay

## 2021-12-21 ENCOUNTER — Ambulatory Visit: Payer: Commercial Managed Care - HMO | Admitting: Internal Medicine

## 2021-12-21 ENCOUNTER — Encounter: Payer: Self-pay | Admitting: Internal Medicine

## 2021-12-21 ENCOUNTER — Telehealth: Payer: Self-pay

## 2021-12-21 DIAGNOSIS — K651 Peritoneal abscess: Secondary | ICD-10-CM

## 2021-12-21 DIAGNOSIS — Z452 Encounter for adjustment and management of vascular access device: Secondary | ICD-10-CM | POA: Diagnosis not present

## 2021-12-21 NOTE — Assessment & Plan Note (Signed)
Patient is stable on Ertapenem and Caspafungin for his polymicrobial intra-abdominal infection.  The end date for therapy is 7/22 and he will have follow up with IR later this week for drain evaluation and repeat CT scan.  No issues with PICC line at this time and he is continuing with wound care for his abdominal wound.  He also sees surgery this week.  Will tentatively extend his antibiotics for an extra week through 7/29 until he has IR and surgery follow up.  I will see him back in 1 week for a phone visit.

## 2021-12-21 NOTE — Patient Instructions (Signed)
Thank you for coming to see me today. It was a pleasure seeing you.  To Do: Continue IV antibiotics We will coordinate with home health to extend them for another week through 7/29 I will call you next week for a phone visit  If you have any questions or concerns, please do not hesitate to call the office at [phone].  Take Care,   Jule Ser

## 2021-12-21 NOTE — Assessment & Plan Note (Signed)
No issues with PICC line and will be maintained while on IV antibiotics.

## 2021-12-21 NOTE — Telephone Encounter (Signed)
Called [facility] to give verbal order to extend IV abx until 7/29 per Dr.Wallace. Vernie Shanks stated a pharmacist wasn't available. Community message sent to [employer].    Good Morning,  Dr.Wallace would like to extend the patient above IV abx until 7/29. If any questions or issues let me know.   Thanks,  Kushi Kun    Gene Glazebrook [employer], CMA

## 2021-12-21 NOTE — Telephone Encounter (Signed)
Thanks Tiffany!

## 2021-12-21 NOTE — Progress Notes (Signed)
      [facility]  CHIEF COMPLAINT:    Follow up for intra-abdominal abscess  SUBJECTIVE:    Robert Larson is a 65 y.o. male with PMHx as below who presents to the clinic for intra-abdominal abscess.   Patient was admitted at  with sepsis due to intra-abdominal infection.   -Initially managed with IR drain placement 6/5 and 6/15 with cultures growing E. coli, anaerobes, and Candida tropicalis (R to fluc) -Status post ex lap and drainage of pelvic abscess 6/20 (no new cultures).  Found to have a good portion of his abdomen frozen at that time so unable to safely undergo resection of the sigmoid colon or diversion -Repeat CT scan 6/24 given clinical instability showed a interval development of large loculated fluid collection status post IR drainage.  Cultures from this growing E. coli, Bacteroides, and Candida albicans. -Discharged 7/4 on ertapenem and caspafungin -Seen by PCP on 12/10/21 for HSFU -Surgery follow up scheduled for 12/24/21.  He is doing wet to dry dressings for his abdominal wound -He continues to have IR drain in place and reports about 5-10 mL of drainage the last 5 days or so (including flushing).  He has IR follow up scheduled 12/23/21. -He is doing well otherwise today.  Feeling much better.  No fevers, chills.  Abdominal wound is improving.  Picc line without issues either.   Please see A&P for the details of today's visit and status of the patient's medical problems.   Patient's Medications  New Prescriptions   No medications on file  Previous Medications   AMLODIPINE (NORVASC) 10 MG TABLET    Take 1 tablet (10 mg total) by mouth daily.   ASCORBIC ACID (VITAMIN C) 500 MG TABLET    Take 1 tablet (500 mg total) by mouth daily.   CASPOFUNGIN (CANCIDAS) IVPB    Inject 70 mg into the vein daily for 23 days. Indication:  Intra-abdominal infection/abscesses  First Dose: Yes Last Day of Therapy:  12/25/2021  Labs - Once weekly:   CBC/D and BMP, Labs - Every other week:  ESR and CRP Method of administration: Elastomeric Method of administration may be changed at the discretion of home infusion pharmacist based upon assessment of the patient and/or caregiver's ability to self-administer the medication ordered.   ERTAPENEM (INVANZ) IVPB    Inject 1 g into the vein daily for 23 days. Indication:  Intra-abdominal infection/abscesses  First Dose: Yes Last Day of Therapy:  12/25/2021  Labs - Once weekly:  CBC/D and BMP, Labs - Every other week:  ESR and CRP Method of administration: Mini-Bag Plus / Gravity Method of administration may be changed at the discretion of home infusion pharmacist based upon assessment of the patient and/or caregiver's ability to self-administer the medication ordered.   METOPROLOL SUCCINATE (TOPROL-XL) 25 MG 24 HR TABLET    Take 1 tablet (25 mg total) by mouth at bedtime. START with 1/2 pill for one week, then increase to 1 full pill.   MIRTAZAPINE (REMERON) 7.5 MG TABLET    Take 1 tablet (7.5 mg total) by mouth at bedtime.   OXYCODONE (OXY IR/ROXICODONE) 5 MG IMMEDIATE RELEASE TABLET    Take 1 tablet (5 mg total) by mouth every 6 (six) hours as needed for severe pain.  Modified Medications   No medications on file  Discontinued Medications   No medications on file      Past Medical History:  Diagnosis Date   Abscess      Acute blood loss anemia 0/0/7622   Acute metabolic encephalopathy 11/06/3352   Acute renal failure superimposed on stage 3a chronic kidney disease ([place]) 11/07/2021   Acute respiratory failure ([place]) 11/29/2021   Acute right-sided low back pain with bilateral sciatica 03/13/2017   Alteration in electrolyte and fluid balance 11/29/2021   Coffee ground emesis 11/08/2021   Contusion of left hip and thigh 02/13/2017   Hyponatremia 11/07/2021   Osteoarthritis    Pressure injury of skin 11/28/2021   Protein in urine    Septic shock ([place]) 11/08/2021   Sleep apnea    Tobacco abuse 11/08/2021     Social History   Tobacco Use   Smoking status: Every Day    Packs/day: 1.00    Types: Cigarettes   Smokeless tobacco: Never  Vaping Use   Vaping Use: Never used  Substance Use Topics   Alcohol use: No   Drug use: Never    Family History  Problem Relation Age of Onset   Breast cancer Mother    Lung cancer Father    Colon cancer Neg Hx    Rectal cancer Neg Hx     No Known Allergies  Review of Systems  All other systems reviewed and are negative.  Except as otherwise noted above.   OBJECTIVE:    Vitals:   12/21/21 0855  BP: 107/73  Pulse: (!) 58  Temp: (!) 97.4 F (36.3 C)  TempSrc: Oral  SpO2: 97%   There is no height or weight on file to calculate BMI.  Physical Exam Constitutional:      General: He is not in acute distress.    Appearance: Normal appearance.     Comments: He is here with his daughter today and appears improved. He is in good spirits.   Pulmonary:     Effort: Pulmonary effort is normal. No respiratory distress.  Musculoskeletal:     Comments: Right UE PICC in place.   Neurological:     General: No focal deficit present.     Mental Status: He is alert and oriented to person, place, and time.  Psychiatric:        Mood and Affect: Mood normal.        Behavior: Behavior normal.      Labs and Microbiology:    Latest Ref Rng & Units 12/03/2021    3:40 AM 12/02/2021   11:31 AM 12/01/2021    1:26 PM  CBC  WBC 4.0 - 10.5 K/uL 8.9  9.9  9.1   Hemoglobin 13.0 - 17.0 g/dL 8.6  9.1  8.4   Hematocrit 39.0 - 52.0 % 27.7  29.0  26.9   Platelets 150 - 400 K/uL 239  264  198       Latest Ref Rng & Units 12/06/2021    4:04 AM 12/05/2021    3:40 AM 12/04/2021    3:35 AM  CMP  Glucose 70 - 99 mg/dL 90  118  113   BUN 8 - 23 mg/dL _0 Creatinine 0.61 - 1.24 mg/dL 1.32  1.32  1.32   Sodium 135 - 145 mmol/L 135  134  136   Potassium 3.5 - 5.1 mmol/L 4.4  4.0  4.1   Chloride 98 - 111 mmol/L 107  107  107   CO2 22 - 32 mmol/L _1 Calcium 8.9 - 10.3 mg/dL 9.0  8.7  8.9   Total Protein 6.5 - 8.1  g/dL 7.6     Total Bilirubin 0.3 - 1.2 mg/dL 0.5     Alkaline Phos 38 - 126 U/L 40     AST 15 - 41 U/L 28     ALT 0 - 44 U/L 14          ASSESSMENT & PLAN:    Intra-abdominal abscess (HCC) Patient is stable on Ertapenem and Caspafungin for his polymicrobial intra-abdominal infection.  The end date for therapy is 7/22 and he will have follow up with IR later this week for drain evaluation and repeat CT scan.  No issues with PICC line at this time and he is continuing with wound care for his abdominal wound.  He also sees surgery this week.  Will tentatively extend his antibiotics for an extra week through 7/29 until he has IR and surgery follow up.  I will see him back in 1 week for a phone visit.  Peripherally inserted central catheter (PICC) in place No issues with PICC line and will be maintained while on IV antibiotics.        N  [facility] 12/21/2021, 8:58 AM   I have personally spent 30 minutes involved in face-to-face and non-face-to-face activities for this patient on the day of the visit. Professional time spent includes the following activities: Preparing to see the patient (review of tests), Obtaining and/or reviewing separately obtained history (admission/discharge record), Performing a medically appropriate examination and/or evaluation , Ordering medications/tests/procedures, referring and communicating with other health care professionals, Documenting clinical information in the EMR, Independently interpreting results (not separately reported), Communicating results to the patient/family/caregiver, Counseling and educating the patient/family/caregiver and Care coordination (not separately reported).   

## 2021-12-22 NOTE — Telephone Encounter (Signed)
Lvm for pt's daughter to call back. 

## 2021-12-23 ENCOUNTER — Ambulatory Visit
Admission: RE | Admit: 2021-12-23 | Discharge: 2021-12-23 | Disposition: A | Discharge status: Home / Self Care | Payer: Commercial Managed Care - HMO | Source: Ambulatory Visit | Attending: General Surgery | Admitting: General Surgery

## 2021-12-23 ENCOUNTER — Other Ambulatory Visit: Payer: Self-pay | Admitting: General Surgery

## 2021-12-23 ENCOUNTER — Ambulatory Visit
Admission: RE | Admit: 2021-12-23 | Discharge: 2021-12-23 | Disposition: A | Discharge status: Home / Self Care | Payer: Commercial Managed Care - HMO | Source: Ambulatory Visit | Attending: Student | Admitting: Student

## 2021-12-23 DIAGNOSIS — K651 Peritoneal abscess: Secondary | ICD-10-CM

## 2021-12-23 HISTORY — PX: IR RADIOLOGIST EVAL & MGMT: IMG5224

## 2021-12-28 ENCOUNTER — Other Ambulatory Visit: Payer: Self-pay

## 2021-12-28 ENCOUNTER — Ambulatory Visit (INDEPENDENT_AMBULATORY_CARE_PROVIDER_SITE_OTHER): Payer: Commercial Managed Care - HMO | Admitting: Internal Medicine

## 2021-12-28 ENCOUNTER — Encounter: Payer: Self-pay | Admitting: Internal Medicine

## 2021-12-28 ENCOUNTER — Telehealth: Payer: Self-pay

## 2021-12-28 DIAGNOSIS — K651 Peritoneal abscess: Secondary | ICD-10-CM | POA: Diagnosis not present

## 2021-12-28 DIAGNOSIS — K769 Liver disease, unspecified: Secondary | ICD-10-CM | POA: Diagnosis not present

## 2021-12-28 NOTE — Progress Notes (Signed)
[facility]  CHIEF COMPLAINT:    Follow up for intra-abdominal infection  SUBJECTIVE:    Robert Larson is a 66 y.o. male with PMHx as below who presents to the clinic for intra-abdominal infection.   -Initially managed with IR drain placement 6/5 and 6/15 with cultures growing E. coli, anaerobes, and Candida tropicalis (R to fluc) -Status post ex lap and drainage of pelvic abscess 6/20 (no new cultures).  Found to have a good portion of his abdomen frozen at that time so unable to safely undergo resection of the sigmoid colon or diversion -Repeat CT scan 6/24 given clinical instability showed a interval development of large loculated fluid collection status post IR drainage.  Cultures from this growing E. coli, Bacteroides, and Candida albicans. -Discharged 7/4 on ertapenem and caspafungin -Seen by me 7/18 -- doing well.  Abx extended pending IR and surgery follow up. 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  Drain left in place and plan for repeat catheter injection on 01/12/22.  CT scan showed:  1.) interval resolution of Left Lower Quadrant collection s/p drain placement.   2.) 3.1cm gas/fluid collection at the inferior margin of the midline anterior body wall healing incision 3.) 2.7 cm right hepatic lesion stable since 11/22/21.  Radiology recommended to consider outpatient MR with contrast to differentiate abscess from neoplasm. -Surgery follow up 7/21: planning to manage conservatively for now.  Follow up again in 4 weeks -OPAT labs 7/18: WBC 7.4, Hgb 11.0, creatinine 1.27, GFR 63, ESR 87, CRP 10. -Today, has now been on current IV regimen of ertapenem and caspafungin since last procedure/drain placement 11/27/21.  Continues to overall do well and feel well.      Please see A&P for the details of today's visit and status of the patient's medical problems.   Patient's Medications  New Prescriptions   No  medications on file  Previous Medications   AMLODIPINE (NORVASC) 10 MG TABLET    Take 1 tablet (10 mg total) by mouth daily.   ASCORBIC ACID (VITAMIN C) 500 MG TABLET    Take 1 tablet (500 mg total) by mouth daily.   METOPROLOL SUCCINATE (TOPROL-XL) 25 MG 24 HR TABLET    Take 1 tablet (25 mg total) by mouth at bedtime. START with 1/2 pill for one week, then increase to 1 full pill.   MIRTAZAPINE (REMERON) 7.5 MG TABLET    Take 1 tablet (7.5 mg total) by mouth at bedtime.   OXYCODONE (OXY IR/ROXICODONE) 5 MG IMMEDIATE RELEASE TABLET    Take 1 tablet (5 mg total) by mouth every 6 (six) hours as needed for severe pain.  Modified Medications   No medications on file  Discontinued Medications   No medications on file      Past Medical History:  Diagnosis Date   Abscess    Acute blood loss anemia 06/09/4816   Acute metabolic encephalopathy 10/10/3147   Acute renal failure superimposed on stage 3a chronic kidney disease ([place]) 11/07/2021   Acute respiratory failure ([place]) 11/29/2021   Acute right-sided low back pain with bilateral sciatica 03/13/2017   Alteration in electrolyte and fluid balance 11/29/2021   Coffee ground emesis 11/08/2021   Contusion of left hip and thigh 02/13/2017   Hyponatremia 11/07/2021   Osteoarthritis    Pressure injury of skin 11/28/2021   Protein in urine    Septic shock ([place]) 11/08/2021   Sleep apnea  Tobacco abuse 11/08/2021    Social History   Tobacco Use   Smoking status: Every Day    Packs/day: 1.00    Types: Cigarettes   Smokeless tobacco: Never  Vaping Use   Vaping Use: Never used  Substance Use Topics   Alcohol use: No   Drug use: Never    Family History  Problem Relation Age of Onset   Breast cancer Mother    Lung cancer Father    Colon cancer Neg Hx    Rectal cancer Neg Hx     No Known Allergies    OBJECTIVE:      Labs and Microbiology:    Latest Ref Rng & Units 12/03/2021    3:40 AM 12/02/2021   11:31 AM 12/01/2021    1:26 PM  CBC  WBC  4.0 - 10.5 K/uL 8.9  9.9  9.1   Hemoglobin 13.0 - 17.0 g/dL 8.6  9.1  8.4   Hematocrit 39.0 - 52.0 % 27.7  29.0  26.9   Platelets 150 - 400 K/uL 239  264  198       Latest Ref Rng & Units 12/06/2021    4:04 AM 12/05/2021    3:40 AM 12/04/2021    3:35 AM  CMP  Glucose 70 - 99 mg/dL 90  118  113   BUN 8 - 23 mg/dL $Remove'26  27  26   'ZVmQWkz$ Creatinine 0.61 - 1.24 mg/dL 1.32  1.32  1.32   Sodium 135 - 145 mmol/L 135  134  136   Potassium 3.5 - 5.1 mmol/L 4.4  4.0  4.1   Chloride 98 - 111 mmol/L 107  107  107   CO2 22 - 32 mmol/L $RemoveB'21  21  22   'LhrIggfa$ Calcium 8.9 - 10.3 mg/dL 9.0  8.7  8.9   Total Protein 6.5 - 8.1 g/dL 7.6     Total Bilirubin 0.3 - 1.2 mg/dL 0.5     Alkaline Phos 38 - 126 U/L 40     AST 15 - 41 U/L 28     ALT 0 - 44 U/L 14          ASSESSMENT & PLAN:    Intra-abdominal abscess (HCC) Patient remains stable on Ertapenem and Caspafungin for complicated intra-abdominal infection.  He had repeat CT scan last week with IR follow up.  Appears to have a controlled fistula from drainage catheter to bowel and catheter was left in place with gravity bag.  He has another IR follow up on 8/9.  Imaging also notable for 3.1cm gas/fluid collection at the inferior margin of the midline anterior body wall healing incision and 2.7 cm right hepatic lesion.    We will continue antibiotics (has now completed 4.5 weeks from last drain placement) pending repeat IR follow up on 01/12/22.    Will tentatively extend therapy to 01/18/22 and follow up with me that date as well.  Will also obtain an MRI with contrast to further evaluate liver lesion to ensure abscess vs neoplasm with the former favored.    Follow up 8/15.   Orders Placed This Encounter  Procedures   MR LIVER W WO CONTRAST    Standing Status:   Future    Standing Expiration Date:   12/29/2022    Order Specific Question:   If indicated for the ordered procedure, I authorize the administration of contrast media per Radiology protocol    Answer:   Yes     Order Specific Question:  What is the patient's sedation requirement?    Answer:   No Sedation    Order Specific Question:   Does the patient have a pacemaker or implanted devices?    Answer:   No    Order Specific Question:   Preferred imaging location?    Answer:   [facility] (table limit - 500 lbs)        Raynelle Highland [facility] 12/28/2021, 4:25 PM   Virtual Visit via Telephone Note   I connected with Robert Larson on 12/28/21 at 4:25 PM by telephone and verified that I am speaking with the correct person using two identifiers.   I discussed the limitations, risks, security and privacy concerns of performing an evaluation and management service by telephone and the availability of in person appointments. I also discussed with the patient that there may be a patient responsible charge related to this service. The patient expressed understanding and agreed to proceed.  Patient location: home My location: rcid Duration of call: 12 min (30 min spent on chart today)

## 2021-12-28 NOTE — Telephone Encounter (Signed)
Per Dr. Juleen China, sent orders to Carolynn Sayers, RN with [facility] to extend IV antibiotics to 01/18/22. Patient has follow up appointment that day and further decisions regarding PICC can be made then.   Beryle Flock, RN

## 2021-12-28 NOTE — Assessment & Plan Note (Signed)
Patient remains stable on Ertapenem and Caspafungin for complicated intra-abdominal infection.  He had repeat CT scan last week with IR follow up.  Appears to have a controlled fistula from drainage catheter to bowel and catheter was left in place with gravity bag.  He has another IR follow up on 8/9.  Imaging also notable for 3.1cm gas/fluid collection at the inferior margin of the midline anterior body wall healing incision and 2.7 cm right hepatic lesion.    We will continue antibiotics (has now completed 4.5 weeks from last drain placement) pending repeat IR follow up on 01/12/22.    Will tentatively extend therapy to 01/18/22 and follow up with me that date as well.  Will also obtain an MRI with contrast to further evaluate liver lesion to ensure abscess vs neoplasm with the former favored.    Follow up 8/15.

## 2021-12-29 NOTE — Addendum Note (Signed)
Addended by: Lucie Leather D on: 12/29/2021 11:07 AM   Modules accepted: Orders

## 2021-12-29 NOTE — Telephone Encounter (Signed)
Thanks Megan!

## 2021-12-31 ENCOUNTER — Ambulatory Visit
Admission: RE | Admit: 2021-12-31 | Discharge: 2021-12-31 | Disposition: A | Discharge status: Home / Self Care | Payer: Commercial Managed Care - HMO | Source: Ambulatory Visit | Attending: Internal Medicine | Admitting: Internal Medicine

## 2021-12-31 ENCOUNTER — Encounter: Payer: Self-pay | Admitting: Family

## 2021-12-31 DIAGNOSIS — G4733 Obstructive sleep apnea (adult) (pediatric): Secondary | ICD-10-CM

## 2021-12-31 DIAGNOSIS — K769 Liver disease, unspecified: Secondary | ICD-10-CM

## 2021-12-31 MED ORDER — GADOBENATE DIMEGLUMINE 529 MG/ML IV SOLN
20.0000 mL | Freq: Once | INTRAVENOUS | Status: AC | PRN
Start: 2021-12-31 — End: 2021-12-31
  Administered 2021-12-31: 20 mL via INTRAVENOUS

## 2022-01-03 ENCOUNTER — Encounter: Payer: Self-pay | Admitting: Internal Medicine

## 2022-01-09 ENCOUNTER — Other Ambulatory Visit: Payer: Commercial Managed Care - HMO

## 2022-01-12 ENCOUNTER — Ambulatory Visit
Admission: RE | Admit: 2022-01-12 | Discharge: 2022-01-12 | Disposition: A | Discharge status: Home / Self Care | Payer: Commercial Managed Care - HMO | Source: Ambulatory Visit | Attending: General Surgery | Admitting: General Surgery

## 2022-01-12 DIAGNOSIS — K651 Peritoneal abscess: Secondary | ICD-10-CM

## 2022-01-12 HISTORY — PX: IR RADIOLOGIST EVAL & MGMT: IMG5224

## 2022-01-12 NOTE — Progress Notes (Signed)
Referring Physician(s): [facility]  Chief Complaint: The patient is seen in follow up today s/p intra abdominal abscess-- probable diverticular. Most recent drain placed 11/27/21   History of present illness:  Known + fistula per 7/20 drain injection Pt doing well Drain is to bag Flushes daily 5 cc sterile saline OP is 5 cc daily Denies pain; fever; chills  Follows with ID Was seen by Dr Juleen China 7/25 Still has DL PICC:  IV regimen of ertapenem and caspafungin  Has appt with [facility] Dr Donne Hazel 8/11 Has another appt with Dr Juleen China 8/15  Scheduled here today for drain injection/evaluation  Past Medical History:  Diagnosis Date   Abscess    Acute blood loss anemia 08/09/5730   Acute metabolic encephalopathy 2/0/2542   Acute renal failure superimposed on stage 3a chronic kidney disease ([place]) 11/07/2021   Acute respiratory failure ([place]) 11/29/2021   Acute right-sided low back pain with bilateral sciatica 03/13/2017   Alteration in electrolyte and fluid balance 11/29/2021   Coffee ground emesis 11/08/2021   Contusion of left hip and thigh 02/13/2017   Hyponatremia 11/07/2021   Osteoarthritis    Pressure injury of skin 11/28/2021   Protein in urine    Septic shock ([place]) 11/08/2021   Sleep apnea    Tobacco abuse 11/08/2021    Past Surgical History:  Procedure Laterality Date   CYSTOSCOPY WITH STENT PLACEMENT Bilateral 11/23/2021   Procedure: CYSTOSCOPY WITH STENT PLACEMENT;  Surgeon: Lucas Mallow, MD;  Location: [facility];  Service: Urology;  Laterality: Bilateral;   IR RADIOLOGIST EVAL & MGMT  12/23/2021   IR RADIOLOGIST EVAL & MGMT  01/12/2022   LAPAROTOMY N/A 11/23/2021   Procedure: EXPLORATORY LAPAROTOMY, PARTIAL COLECTOMY AND COLOSTOMY;  Surgeon: Rolm Bookbinder, MD;  Location: [facility];  Service: General;  Laterality: N/A;   REPLACEMENT TOTAL HIP W/  RESURFACING IMPLANTS     Left     Allergies: Patient has no known allergies.  Medications: Prior to Admission  medications   Medication Sig Start Date End Date Taking? Authorizing Provider  amLODipine (NORVASC) 10 MG tablet Take 1 tablet (10 mg total) by mouth daily. 12/05/21   Danford, Suann Larry, MD  ascorbic acid (VITAMIN C) 500 MG tablet Take 1 tablet (500 mg total) by mouth daily. 12/05/21   Danford, Suann Larry, MD  metoprolol succinate (TOPROL-XL) 25 MG 24 hr tablet Take 1 tablet (25 mg total) by mouth at bedtime. START with 1/2 pill for one week, then increase to 1 full pill. Patient taking differently: Take 25 mg by mouth at bedtime. 10/18/21   Jeanie Sewer, NP  mirtazapine (REMERON) 7.5 MG tablet Take 1 tablet (7.5 mg total) by mouth at bedtime. 12/05/21   Danford, Suann Larry, MD  oxyCODONE (OXY IR/ROXICODONE) 5 MG immediate release tablet Take 1 tablet (5 mg total) by mouth every 6 (six) hours as needed for severe pain. 12/05/21   Danford, Suann Larry, MD     Family History  Problem Relation Age of Onset   Breast cancer Mother    Lung cancer Father    Colon cancer Neg Hx    Rectal cancer Neg Hx     Social History   Socioeconomic History   Marital status: Married    Spouse name: Not on file   Number of children: 1   Years of education: Not on file   Highest education level: Not on file  Occupational History   Occupation: body shop owner  Tobacco Use   Smoking  status: Every Day    Packs/day: 1.00    Types: Cigarettes   Smokeless tobacco: Never  Vaping Use   Vaping Use: Never used  Substance and Sexual Activity   Alcohol use: No   Drug use: Never   Sexual activity: Not on file  Other Topics Concern   Not on file  Social History Narrative   Not on file   Social Determinants of Health   Financial Resource Strain: Not on file  Food Insecurity: Not on file  Transportation Needs: Not on file  Physical Activity: Not on file  Stress: Not on file  Social Connections: Not on file     Vital Signs: BP 125/74   Pulse 60   Temp 98.6 F (37 C)   SpO2 98%    Physical Exam Vitals reviewed.  Skin:    General: Skin is warm.     Comments: Site is clean and dry NT no bleeding Drain intact OP is minimal in bag  Injection is + for fistula       Imaging: IR Radiologist Eval & Mgmt  Result Date: 01/12/2022 Please refer to notes tab for details about interventional procedure. (Op Note)   Labs:  CBC: Recent Labs    12/01/21 1130 12/01/21 1326 12/02/21 1131 12/03/21 0340  WBC 8.1 9.1 9.9 8.9  HGB 7.0* 8.4* 9.1* 8.6*  HCT 22.5* 26.9* 29.0* 27.7*  PLT 165 198 264 239    COAGS: Recent Labs    11/08/21 0822 11/09/21 0616 11/12/21 1327  INR 1.6* 2.1* 1.3*    BMP: Recent Labs    12/03/21 0340 12/04/21 0335 12/05/21 0340 12/06/21 0404  NA 135 136 134* 135  K 4.5 4.1 4.0 4.4  CL 106 107 107 107  CO2 22 22 21* 21*  GLUCOSE 115* 113* 118* 90  BUN 28* 26* 27* 26*  CALCIUM 8.7* 8.9 8.7* 9.0  CREATININE 1.15 1.32* 1.32* 1.32*  GFRNONAA >60 >60 >60 >60    LIVER FUNCTION TESTS: Recent Labs    11/30/21 0342 12/01/21 0436 12/02/21 0341 12/06/21 0404  BILITOT 0.7 0.5 0.6 0.5  AST 35 '29 31 28  '$ ALT '19 18 17 14  '$ ALKPHOS 38 39 39 40  PROT 6.7 6.9 7.1 7.6  ALBUMIN 1.8* 1.8* 1.8* 2.1*    Assessment:  Intra abdominal abscess drain intact Persistent + fistula per injection Discussed with daughter To stop flushes now Keep appts with [facility] and ID If no surgery planned--- we will see pt back 3-4 weeks They have good understanding of plan  Signed: Lavonia Drafts, PA-C 01/12/2022, 1:28 PM   Please refer to Dr. Kathlene Cote attestation of this note for management and plan.

## 2022-01-13 ENCOUNTER — Ambulatory Visit (INDEPENDENT_AMBULATORY_CARE_PROVIDER_SITE_OTHER): Payer: Commercial Managed Care - HMO | Admitting: Nurse Practitioner

## 2022-01-13 ENCOUNTER — Encounter: Payer: Self-pay | Admitting: Nurse Practitioner

## 2022-01-13 VITALS — BP 138/86 | HR 73 | Ht 67.0 in | Wt 245.2 lb

## 2022-01-13 DIAGNOSIS — I10 Essential (primary) hypertension: Secondary | ICD-10-CM

## 2022-01-13 DIAGNOSIS — G4733 Obstructive sleep apnea (adult) (pediatric): Secondary | ICD-10-CM | POA: Diagnosis not present

## 2022-01-13 DIAGNOSIS — Z720 Tobacco use: Secondary | ICD-10-CM

## 2022-01-13 DIAGNOSIS — N1831 Chronic kidney disease, stage 3a: Secondary | ICD-10-CM | POA: Diagnosis not present

## 2022-01-13 DIAGNOSIS — I480 Paroxysmal atrial fibrillation: Secondary | ICD-10-CM

## 2022-01-13 DIAGNOSIS — K651 Peritoneal abscess: Secondary | ICD-10-CM

## 2022-01-13 DIAGNOSIS — E1169 Type 2 diabetes mellitus with other specified complication: Secondary | ICD-10-CM

## 2022-01-13 NOTE — Progress Notes (Signed)
Office Visit    Patient Name: Robert Larson Date of Encounter: 01/13/2022  Primary Care Provider:  Jeanie Sewer, NP Primary Cardiologist:  Janina Mayo, MD  Chief Complaint    66 year old male with a history of paroxysmal atrial fibrillation, hypertension, OSA, tobacco use, type 2 diabetes, CKD stage IIIa, adrenal nodule, renal cyst, intra-abdominal abscess and osteoarthritis who presents for hospital follow-up related to atrial fibrillation.  Past Medical History    Past Medical History:  Diagnosis Date   Abscess    Acute blood loss anemia 4/0/1027   Acute metabolic encephalopathy 07/11/3662   Acute renal failure superimposed on stage 3a chronic kidney disease ([place]) 11/07/2021   Acute respiratory failure ([place]) 11/29/2021   Acute right-sided low back pain with bilateral sciatica 03/13/2017   Alteration in electrolyte and fluid balance 11/29/2021   Coffee ground emesis 11/08/2021   Contusion of left hip and thigh 02/13/2017   Hyponatremia 11/07/2021   Osteoarthritis    Pressure injury of skin 11/28/2021   Protein in urine    Septic shock ([place]) 11/08/2021   Sleep apnea    Tobacco abuse 11/08/2021   Past Surgical History:  Procedure Laterality Date   CYSTOSCOPY WITH STENT PLACEMENT Bilateral 11/23/2021   Procedure: CYSTOSCOPY WITH STENT PLACEMENT;  Surgeon: Lucas Mallow, MD;  Location: [facility];  Service: Urology;  Laterality: Bilateral;   IR RADIOLOGIST EVAL & MGMT  12/23/2021   IR RADIOLOGIST EVAL & MGMT  01/12/2022   LAPAROTOMY N/A 11/23/2021   Procedure: EXPLORATORY LAPAROTOMY, PARTIAL COLECTOMY AND COLOSTOMY;  Surgeon: Rolm Bookbinder, MD;  Location: [facility];  Service: General;  Laterality: N/A;   REPLACEMENT TOTAL HIP W/  RESURFACING IMPLANTS     Left     Allergies  No Known Allergies  History of Present Illness    66 year old male with the above past medical history including paroxysmal atrial fibrillation, hypertension, OSA, tobacco use, type 2 diabetes, CKD stage  IIIa, adrenal nodule, renal cyst, intra-abdominal abscess and osteoarthritis.  The ED on 11/07/2021 with complaints of 69-monthhistory of abdominal pain and constipation.  CT abdomen pelvis showed intra-abdominal abscess.  He was started on IV antibiotics.  His condition worsened and he was transferred to the ICU in the setting of septic shock.  IR drain was placed in his abdomen.  He was hospitalized from 11/07/2021-12/07/2021 in the setting of septic shock secondary to intra-abdominal abscess. On 11/09/2021 he developed atrial fibrillation with RVR.  Cardiology was consulted and he was started on a heparin drip, IV diltiazem.  Troponin was flat.  Echocardiogram showed EF 60 to 65%, normal LV function, no RWMA, mild concentric LVH, trivial mitral valve regurgitation, mild dilation of ascending aorta, measuring 42 mm.  He converted to sinus rhythm on 11/13/2021.  Given normal echo, resolution of atrial fibrillation, he was not started on DOAC therapy.  30-day monitor was recommended at discharge to evaluate for recurrence of atrial fibrillation.  Abdominal drain was removed on 11/16/2021.  However, repeat CT on 11/17/2021 showed recurrent pelvic abscess. Two new drains were placed by IR in the lower abdomen bilaterally on 11/18/2021.  Culture grew Candida tropicalis.  ID was consulted.  He underwent exploratory laparoscopic, bilateral open ended urethral catheter placement, wound VAC was placed, drain was placed for pelvic fluid collection on [id number].  On 11/27/2021 he developed acute fever, confusion, tachycardia, and respiratory failure. PCCM was consulted and he was intubated.  His hospital stay was also complicated by AKI and transaminitis.  He was extubated on 11/28/2021.  He was discharged home with home health on 12/07/2021.  He presents today for follow-up today accompanied by his daughter. Since his hospitalization he has done well from a cardiac standpoint.  He denies any chest pain, dyspnea, palpitations, edema,  PND, orthopnea.  He is slowly gaining strength.  He continues to follow with ID and IR/general surgery for management of his abdominal abscess.  He is not aware of any recurrence of atrial fibrillation.  He just sent his monitor off yesterday-results are pending.  Overall, he reports feeling well and denies any cardiac-related concerns today.  Home Medications    Current Outpatient Medications  Medication Sig Dispense Refill   amLODipine (NORVASC) 10 MG tablet Take 1 tablet (10 mg total) by mouth daily. 30 tablet 3   ascorbic acid (VITAMIN C) 500 MG tablet Take 1 tablet (500 mg total) by mouth daily.     metoprolol succinate (TOPROL-XL) 25 MG 24 hr tablet Take 1 tablet (25 mg total) by mouth at bedtime. START with 1/2 pill for one week, then increase to 1 full pill. (Patient taking differently: Take 25 mg by mouth at bedtime.) 30 tablet 2   mirtazapine (REMERON) 7.5 MG tablet Take 1 tablet (7.5 mg total) by mouth at bedtime. 30 tablet 3   Zinc Citrate-Phytase (ZYTAZE) 25-500 MG CAPS Take by mouth.     Current Facility-Administered Medications  Medication Dose Route Frequency Provider Last Rate Last Admin   0.9 %  sodium chloride infusion  500 mL Intravenous Once Irene Shipper, MD         Review of Systems    He denies chest pain, palpitations, dyspnea, pnd, orthopnea, n, v, dizziness, syncope, edema, weight gain, or early satiety. All other systems reviewed and are otherwise negative except as noted above.   Physical Exam    VS:  BP 138/86   Pulse 73   Ht '5\' 7"'$  (1.702 m)   Wt 245 lb 3.2 oz (111.2 kg)   SpO2 97%   BMI 38.40 kg/m  GEN: Well nourished, well developed, in no acute distress. HEENT: normal. Neck: Supple, no JVD, carotid bruits, or masses. Cardiac: RRR, no murmurs, rubs, or gallops. No clubbing, cyanosis, edema.  Radials/DP/PT 2+ and equal bilaterally.  Respiratory:  Respirations regular and unlabored, clear to auscultation bilaterally. GI: Soft, nontender, nondistended,  BS + x 4. MS: no deformity or atrophy. Skin: warm and dry, no rash. Neuro:  Strength and sensation are intact. Psych: Normal affect.  Accessory Clinical Findings    ECG personally reviewed by me today - NSR, 70 bpm - no acute changes.  Lab Results  Component Value Date   WBC 8.9 12/03/2021   HGB 8.6 (L) 12/03/2021   HCT 27.7 (L) 12/03/2021   MCV 101.1 (H) 12/03/2021   PLT 239 12/03/2021   Lab Results  Component Value Date   CREATININE 1.32 (H) 12/06/2021   BUN 26 (H) 12/06/2021   NA 135 12/06/2021   K 4.4 12/06/2021   CL 107 12/06/2021   CO2 21 (L) 12/06/2021   Lab Results  Component Value Date   ALT 14 12/06/2021   AST 28 12/06/2021   ALKPHOS 40 12/06/2021   BILITOT 0.5 12/06/2021   Lab Results  Component Value Date   CHOL 159 06/24/2021   HDL 29 (A) 06/24/2021   LDLCALC 41 06/24/2021   TRIG 157 (H) 12/06/2021    Lab Results  Component Value Date   HGBA1C 6.1 (A) 09/16/2021  Assessment & Plan    1. Paroxysmal atrial fibrillation: Occurred in the setting of acute sepsis.  Maintaining NSR.  Sent off his completed monitor yesterday.  Results pending.  If monitor shows recurrent a fib, consider DOAC at that time. For now, will hold off.  Continue metoprolol.  2. Hypertension: BP well controlled. Continue current antihypertensive regimen.   3. OSA: No concerns.   4. CKD stage IIIa: Creatinine was 1.32 on 12/06/2021.  Stable.  5. Type 2 diabetes: A1c was 6.1 on 09/2021.  Monitored and managed per PCP.  6. Tobacco use: Cessation advised.   7. Sepsis/intra-abdominal abcess: Following with ID, IR/general surgery.  He is still receiving IV antibiotics, drain in place, currently undergoing wet-to-dry dressings.  Denies fevers, chills, pain.  8. Disposition: Follow-up in 2-3 months.   Lenna Sciara, NP 01/13/2022, 12:26 PM

## 2022-01-13 NOTE — Patient Instructions (Signed)
Medication Instructions:  Your physician recommends that you continue on your current medications as directed. Please refer to the Current Medication list given to you today.   *If you need a refill on your cardiac medications before your next appointment, please call your pharmacy*   Lab Work: NONE ordered at this time of appointment   If you have labs (blood work) drawn today and your tests are completely normal, you will receive your results only by: [place] (if you have MyChart) OR A paper copy in the mail If you have any lab test that is abnormal or we need to change your treatment, we will call you to review the results.   Testing/Procedures: NONE ordered at this time of appointment     Follow-Up: At [facility], you and your health needs are our priority.  As part of our continuing mission to provide you with exceptional heart care, we have created designated Provider Care Teams.  These Care Teams include your primary Cardiologist (physician) and Advanced Practice Providers (APPs -  Physician Assistants and Nurse Practitioners) who all work together to provide you with the care you need, when you need it.  We recommend signing up for the patient portal called "MyChart".  Sign up information is provided on this After Visit Summary.  MyChart is used to connect with patients for Virtual Visits (Telemedicine).  Patients are able to view lab/test results, encounter notes, upcoming appointments, etc.  Non-urgent messages can be sent to your provider as well.   To learn more about what you can do with MyChart, go to [email or web address].    Your next appointment:   2-3 month(s)  The format for your next appointment:   In Person  Provider:   Janina Mayo, MD     Other Instructions   Important Information About Sugar

## 2022-01-14 NOTE — Addendum Note (Signed)
Addended by: Deanna Artis A on: 01/14/2022 10:47 AM   Modules accepted: Orders

## 2022-01-18 ENCOUNTER — Other Ambulatory Visit: Payer: Self-pay

## 2022-01-18 ENCOUNTER — Telehealth: Payer: Self-pay

## 2022-01-18 ENCOUNTER — Encounter: Payer: Self-pay | Admitting: Family

## 2022-01-18 ENCOUNTER — Ambulatory Visit: Payer: Commercial Managed Care - HMO | Admitting: Internal Medicine

## 2022-01-18 ENCOUNTER — Encounter: Payer: Self-pay | Admitting: Internal Medicine

## 2022-01-18 VITALS — BP 143/85 | HR 57 | Temp 97.7°F | Ht 67.0 in | Wt 242.0 lb

## 2022-01-18 DIAGNOSIS — R932 Abnormal findings on diagnostic imaging of liver and biliary tract: Secondary | ICD-10-CM

## 2022-01-18 DIAGNOSIS — K651 Peritoneal abscess: Secondary | ICD-10-CM | POA: Diagnosis not present

## 2022-01-18 DIAGNOSIS — Z452 Encounter for adjustment and management of vascular access device: Secondary | ICD-10-CM

## 2022-01-18 NOTE — Assessment & Plan Note (Signed)
Patient here today for follow up.  Overall doing well.  Has been on Ertapenem and Caspafungin for ~7.5 weeks since his last surgery/IR procedure on 11/27/21.  He has been following with IR and surgery, having seen them both last week.  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  There is no plans for surgical intervention right now.  Otherwise, I am hopeful that his intra-abdominal infection has been adequately treated at this time with a prolonged antibiotics course and the risk of ongoing antibiotics likely outweighs the benefit.  We will stop antibiotics today and remove PICC line.  His MRI liver noted what could have been a partially treated abscess on 12/31/21 vs less likely [place].  Will plan to repeat an MRI liver again in about 3 months to reassess for improvement and potential need for tissue sampling.  Return precautions discussed.  RTC 3 months for virtual visit but sooner if needed.

## 2022-01-18 NOTE — Patient Instructions (Addendum)
Thank you for coming to see me today. It was a pleasure seeing you.  To Do: We will have PICC line removed by home health and stop antibiotics Follow up in 3 months for virtual visit  If you have any questions or concerns, please do not hesitate to call the office at [phone].  Take Care,   Jule Ser

## 2022-01-18 NOTE — Telephone Encounter (Signed)
Per MD stop IV antibiotics today and pull picc. Called [facility] and spoke to the Pharmacist Jeani Hawking who read back order and verbalized understanding.

## 2022-01-18 NOTE — Progress Notes (Signed)
[place] for Infectious Disease  CHIEF COMPLAINT:    Follow up for intra-abdominal infection  SUBJECTIVE:    Robert Larson is a 66 y.o. male with PMHx as below who presents to the clinic for intra-abdominal infection.   -Initially managed with IR drain placement 6/5 and 6/15 with cultures growing E. coli, anaerobes, and Candida tropicalis (R to fluc) -Status post ex lap and drainage of pelvic abscess 6/20 (no new cultures).  Found to have a good portion of his abdomen frozen at that time so unable to safely undergo resection of the sigmoid colon or diversion -Repeat CT scan 6/24 given clinical instability showed a interval development of large loculated fluid collection status post IR drainage.  Cultures from this growing E. coli, Bacteroides, and Candida albicans. -Discharged 7/4 on ertapenem and caspafungin -Seen by me 7/18 -- doing well.  Abx extended pending IR and surgery follow up. 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  Drain left in place and plan for repeat catheter injection on 01/12/22.  CT scan showed:  1.) interval resolution of Left Lower Quadrant collection s/p drain placement.   2.) 3.1cm gas/fluid collection at the inferior margin of the midline anterior body wall healing incision 3.) 2.7 cm right hepatic lesion stable since 11/22/21.  Radiology recommended to consider outpatient MR with contrast to differentiate abscess from neoplasm. -Surgery follow up 7/21: planning to manage conservatively for now.  Follow up again in 4 weeks -OPAT labs 7/18: WBC 7.4, Hgb 11.0, creatinine 1.27, GFR 63, ESR 87, CRP 10. -7/25 phone visit: has now been on current IV regimen of ertapenem and caspafungin since last procedure/drain placement 11/27/21.  Continues to overall do well and feel well.  -7/28 MRI liver -- 3.1 cm subscapular right hepatic lobe lesion.  Diff dx includes partially treated abscess, hemangioma.  Less likely  [place]. -8/9 IR follow up: persistent fistula appears to be decreasing.  -8/11 follow up with surgery.  Doing well and abdominal wound noted to be nearly healed. No plans for surgery. - Today - continue to feel better and better.  No fevers, chills.  Drain remains in place.  Surgery told him he probably does not need further antibiotics.     Please see A&P for the details of today's visit and status of the patient's medical problems.   Patient's Medications  New Prescriptions   No medications on file  Previous Medications   AMLODIPINE (NORVASC) 10 MG TABLET    Take 1 tablet (10 mg total) by mouth daily.   ASCORBIC ACID (VITAMIN C) 500 MG TABLET    Take 1 tablet (500 mg total) by mouth daily.   METOPROLOL SUCCINATE (TOPROL-XL) 25 MG 24 HR TABLET    Take 1 tablet (25 mg total) by mouth at bedtime. START with 1/2 pill for one week, then increase to 1 full pill.   MIRTAZAPINE (REMERON) 7.5 MG TABLET    Take 1 tablet (7.5 mg total) by mouth at bedtime.   ZINC CITRATE-PHYTASE (ZYTAZE) 25-500 MG CAPS    Take by mouth.  Modified Medications   No medications on file  Discontinued Medications   No medications on file      Past Medical History:  Diagnosis Date   Abscess    Acute blood loss anemia 06/11/1094   Acute metabolic encephalopathy 0/09/5407   Acute renal failure superimposed on stage 3a chronic kidney disease ([place]) 11/07/2021   Acute respiratory  failure ([place]) 11/29/2021   Acute right-sided low back pain with bilateral sciatica 03/13/2017   Alteration in electrolyte and fluid balance 11/29/2021   Coffee ground emesis 11/08/2021   Contusion of left hip and thigh 02/13/2017   Hyponatremia 11/07/2021   Osteoarthritis    Pressure injury of skin 11/28/2021   Protein in urine    Septic shock ([place]) 11/08/2021   Sleep apnea    Tobacco abuse 11/08/2021    Social History   Tobacco Use   Smoking status: Every Day    Packs/day: 1.00    Types: Cigarettes   Smokeless tobacco: Never  Vaping Use    Vaping Use: Never used  Substance Use Topics   Alcohol use: No   Drug use: Never    Family History  Problem Relation Age of Onset   Breast cancer Mother    Lung cancer Father    Colon cancer Neg Hx    Rectal cancer Neg Hx     No Known Allergies  Review of Systems  All other systems reviewed and are negative.  Except as noted above.   OBJECTIVE:    Physical Exam Constitutional:      General: He is not in acute distress.    Appearance: Normal appearance.  HENT:     Head: Normocephalic and atraumatic.  Eyes:     Extraocular Movements: Extraocular movements intact.     Conjunctiva/sclera: Conjunctivae normal.  Pulmonary:     Effort: Pulmonary effort is normal. No respiratory distress.  Abdominal:     Comments: Abdominal drain in place.  Dressing over abdominal wound.  Pictures reviewed on his phone.   Musculoskeletal:     Comments: PICC in place.  Skin:    General: Skin is warm and dry.  Neurological:     Mental Status: He is alert.  Psychiatric:        Mood and Affect: Mood normal.        Behavior: Behavior normal.      Labs and Microbiology:    Latest Ref Rng & Units 12/03/2021    3:40 AM 12/02/2021   11:31 AM 12/01/2021    1:26 PM  CBC  WBC 4.0 - 10.5 K/uL 8.9  9.9  9.1   Hemoglobin 13.0 - 17.0 g/dL 8.6  9.1  8.4   Hematocrit 39.0 - 52.0 % 27.7  29.0  26.9   Platelets 150 - 400 K/uL 239  264  198       Latest Ref Rng & Units 12/06/2021    4:04 AM 12/05/2021    3:40 AM 12/04/2021    3:35 AM  CMP  Glucose 70 - 99 mg/dL 90  118  113   BUN 8 - 23 mg/dL $Remove'26  27  26   'VNEXuZL$ Creatinine 0.61 - 1.24 mg/dL 1.32  1.32  1.32   Sodium 135 - 145 mmol/L 135  134  136   Potassium 3.5 - 5.1 mmol/L 4.4  4.0  4.1   Chloride 98 - 111 mmol/L 107  107  107   CO2 22 - 32 mmol/L $RemoveB'21  21  22   'ZUYhelxr$ Calcium 8.9 - 10.3 mg/dL 9.0  8.7  8.9   Total Protein 6.5 - 8.1 g/dL 7.6     Total Bilirubin 0.3 - 1.2 mg/dL 0.5     Alkaline Phos 38 - 126 U/L 40     AST 15 - 41 U/L 28     ALT 0 - 44 U/L  14  ASSESSMENT & PLAN:    Intra-abdominal abscess [facility]) Patient here today for follow up.  Overall doing well.  Has been on Ertapenem and Caspafungin for ~7.5 weeks since his last surgery/IR procedure on 11/27/21.  He has been following with IR and surgery, having seen them both last week.  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  There is no plans for surgical intervention right now.  Otherwise, I am hopeful that his intra-abdominal infection has been adequately treated at this time with a prolonged antibiotics course and the risk of ongoing antibiotics likely outweighs the benefit.  We will stop antibiotics today and remove PICC line.  His MRI liver noted what could have been a partially treated abscess on 12/31/21 vs less likely [place].  Will plan to repeat an MRI liver again in about 3 months to reassess for improvement and potential need for tissue sampling.  Return precautions discussed.  RTC 3 months for virtual visit but sooner if needed.         Raynelle Highland for Infectious Disease [facility] 01/18/2022, 10:56 AM   I have personally spent 40 minutes involved in face-to-face and non-face-to-face activities for this patient on the day of the visit. Professional time spent includes the following activities: Preparing to see the patient (review of tests), Obtaining and/or reviewing separately obtained history (admission/discharge record), Performing a medically appropriate examination and/or evaluation , Ordering medications/tests/procedures, referring and communicating with other health care professionals, Documenting clinical information in the EMR, Independently interpreting results (not separately reported), Communicating results to the patient/family/caregiver, Counseling and educating the patient/family/caregiver and Care coordination (not separately reported).

## 2022-01-18 NOTE — Telephone Encounter (Signed)
Thank you Aniceto Boss!

## 2022-01-24 NOTE — Telephone Encounter (Signed)
Please call [facility] and see how I need to order, I thought I was ordering a machine. Do we need to fax a written RX?

## 2022-01-26 ENCOUNTER — Telehealth: Payer: Self-pay | Admitting: *Deleted

## 2022-01-26 MED ORDER — APIXABAN 5 MG PO TABS: 5.0000 mg | ORAL_TABLET | Freq: Two times a day (BID) | ORAL | 6 refills | Status: DC

## 2022-01-26 NOTE — Telephone Encounter (Signed)
-----   Message from Janina Mayo, MD sent at 01/26/2022  8:13 AM EDT ----- Robert Larson, please let Mrs Ostergaard know that he had recurrent atrial fibrillation on his monitor. I recommend a blood thinner for this to prevent stroke. Please start eliquis 5 mg BID. If he has any bleeding, he should stop it and let us know.

## 2022-01-26 NOTE — Telephone Encounter (Signed)
Spoke with pt daughter, aware of dr branch's recommendations. New script sent to the pharmacy

## 2022-01-28 NOTE — Telephone Encounter (Addendum)
Daughter, [name] is calling in requesting update in regard to this message.    Is requesting call back ASAP at [phone].  States order can also be faxed to [phone].  States patient has been without sleep apnea machine.  Would like to know if verbal order can be called in due to this.

## 2022-02-09 NOTE — Telephone Encounter (Signed)
Patient states he is still having trouble getting his CPAP machine due to [place] told Patient that Provider would have to put pressure setting is 14 for the CPAP on the RX

## 2022-02-10 NOTE — Telephone Encounter (Addendum)
Spoke with patient's Daughter [name], she requested to fax the order to [facility] at the number provider in there cover sheet. Order updated to include setting pressure "14" nd re-faxed to [facility] at [phone]

## 2022-02-11 ENCOUNTER — Other Ambulatory Visit: Payer: Self-pay | Admitting: Family

## 2022-02-11 DIAGNOSIS — I10 Essential (primary) hypertension: Secondary | ICD-10-CM

## 2022-02-18 ENCOUNTER — Other Ambulatory Visit (HOSPITAL_COMMUNITY): Payer: Self-pay | Admitting: Radiology

## 2022-02-18 ENCOUNTER — Encounter (HOSPITAL_COMMUNITY): Payer: Self-pay

## 2022-02-18 ENCOUNTER — Ambulatory Visit (HOSPITAL_COMMUNITY)
Admission: RE | Admit: 2022-02-18 | Discharge: 2022-02-18 | Disposition: A | Discharge status: Home / Self Care | Payer: Commercial Managed Care - HMO | Source: Ambulatory Visit | Attending: Radiology | Admitting: Radiology

## 2022-02-18 DIAGNOSIS — K8080 Other cholelithiasis without obstruction: Secondary | ICD-10-CM | POA: Insufficient documentation

## 2022-02-18 DIAGNOSIS — Z4800 Encounter for change or removal of nonsurgical wound dressing: Secondary | ICD-10-CM | POA: Diagnosis present

## 2022-02-18 HISTORY — PX: ABSCESS DRAINAGE: SHX1119

## 2022-02-18 NOTE — Procedures (Signed)
Pt came in for a stat lock replacement and dressing change.

## 2022-02-28 ENCOUNTER — Encounter: Payer: Self-pay | Admitting: *Deleted

## 2022-03-16 ENCOUNTER — Ambulatory Visit: Payer: Commercial Managed Care - HMO | Attending: Internal Medicine | Admitting: Internal Medicine

## 2022-03-16 ENCOUNTER — Encounter: Payer: Self-pay | Admitting: Internal Medicine

## 2022-03-16 VITALS — BP 122/74 | HR 65 | Ht 67.0 in | Wt 248.8 lb

## 2022-03-16 DIAGNOSIS — I480 Paroxysmal atrial fibrillation: Secondary | ICD-10-CM | POA: Diagnosis not present

## 2022-03-16 NOTE — Progress Notes (Signed)
Cardiology Office Note:    Date:  03/16/2022   ID:  Robert Larson, DOB [birth year], MRN [id number]  PCP:  Jeanie Sewer, NP   [facility] Cardiologist:  Janina Mayo, MD     Referring MD: Jeanie Sewer, NP   No chief complaint on file. Hospital FU  History of Present Illness:    Robert Larson is a 67 y.o. male with a hx of sleep apnea on CAP, T2DM, HTN, paroxysmal atrial fibrillation chads2vasc=2,  normal LV function and normal LA size.  He was seen in the hospital in June with new onset afib. This was in the setting of diverticulitis c/b bowel perforation with multiple  intra-abdominal abscess s/p  ex-lap, bilateral open ended ureteral catheter placement, wound vac placed IR drain. He was septic growing E. Coli. With drain in place, was hesitant about AC. He was on farxiga with Dm2 prior, this was stopped as well as metformin. He was FTT/malnourished and TPN. He was dc'd on IV caspofungin and ertapenem. No AC. Has macrocytic anemia.   Today, he denies bleeding.  No palpitations. He denies SOB or CP. No syncope. He has his drain in place.   Cardiology Studies: 12/12/2021 30 day- cardiac monitor , minimal episodes of afib in the early AM. Minimal ectopy.  TTE 11/09/2021- normal LV fxn, no significant valve dx    Past Medical History:  Diagnosis Date   Abscess    Acute blood loss anemia 06/12/5535   Acute metabolic encephalopathy 09/11/2705   Acute renal failure superimposed on stage 3a chronic kidney disease ([place]) 11/07/2021   Acute respiratory failure ([place]) 11/29/2021   Acute right-sided low back pain with bilateral sciatica 03/13/2017   Alteration in electrolyte and fluid balance 11/29/2021   Coffee ground emesis 11/08/2021   Contusion of left hip and thigh 02/13/2017   Hyponatremia 11/07/2021   Osteoarthritis    Pressure injury of skin 11/28/2021   Protein in urine    Septic shock ([place]) 11/08/2021   Sleep apnea    Tobacco abuse 11/08/2021    Past  Surgical History:  Procedure Laterality Date   ABSCESS DRAINAGE  02/18/2022   CYSTOSCOPY WITH STENT PLACEMENT Bilateral 11/23/2021   Procedure: CYSTOSCOPY WITH STENT PLACEMENT;  Surgeon: Lucas Mallow, MD;  Location: [facility];  Service: Urology;  Laterality: Bilateral;   IR PATIENT EVAL TECH 0-60 MINS  02/18/2022   IR RADIOLOGIST EVAL & MGMT  12/23/2021   IR RADIOLOGIST EVAL & MGMT  01/12/2022   LAPAROTOMY N/A 11/23/2021   Procedure: EXPLORATORY LAPAROTOMY, PARTIAL COLECTOMY AND COLOSTOMY;  Surgeon: Rolm Bookbinder, MD;  Location: [facility];  Service: General;  Laterality: N/A;   REPLACEMENT TOTAL HIP W/  RESURFACING IMPLANTS     Left     Current Medications: No outpatient medications have been marked as taking for the 03/16/22 encounter (Appointment) with Janina Mayo, MD.   Current Facility-Administered Medications for the 03/16/22 encounter (Appointment) with Janina Mayo, MD  Medication   0.9 %  sodium chloride infusion     Allergies:   Patient has no known allergies.   Social History   Socioeconomic History   Marital status: Married    Spouse name: Not on file   Number of children: 1   Years of education: Not on file   Highest education level: Not on file  Occupational History   Occupation: body shop owner  Tobacco Use   Smoking status: Every Day    Packs/day: 1.00  Types: Cigarettes   Smokeless tobacco: Never  Vaping Use   Vaping Use: Never used  Substance and Sexual Activity   Alcohol use: No   Drug use: Never   Sexual activity: Not on file  Other Topics Concern   Not on file  Social History Narrative   Not on file   Social Determinants of Health   Financial Resource Strain: Not on file  Food Insecurity: Not on file  Transportation Needs: Not on file  Physical Activity: Not on file  Stress: Not on file  Social Connections: Not on file     Family History: The patient's family history includes Breast cancer in his mother; Lung cancer in his father.  There is no history of Colon cancer or Rectal cancer.  ROS:   Please see the history of present illness.     All other systems reviewed and are negative.  EKGs/Labs/Other Studies Reviewed:    The following studies were reviewed today:   EKG:  EKG is  ordered today.  The ekg ordered today demonstrates   03/16/2022-NSR  Recent Labs: 11/12/2021: TSH 3.480 11/14/2021: B Natriuretic Peptide 158.3 12/03/2021: Hemoglobin 8.6; Platelets 239 12/06/2021: ALT 14; BUN 26; Creatinine, Ser 1.32; Magnesium 1.8; Potassium 4.4; Sodium 135  Recent Lipid Panel    Component Value Date/Time   CHOL 159 06/24/2021 0000   TRIG 157 (H) 12/06/2021 0404   HDL 29 (A) 06/24/2021 0000   LDLCALC 41 06/24/2021 0000     Risk Assessment/Calculations:     Physical Exam:    VS:   Vitals:   03/16/22 1629  BP: 122/74  Pulse: 65  SpO2: 96%      Wt Readings from Last 3 Encounters:  01/18/22 242 lb (109.8 kg)  01/13/22 245 lb 3.2 oz (111.2 kg)  12/10/21 243 lb (110.2 kg)     GEN:  Well nourished, well developed in no acute distress HEENT: Normal NECK: No JVD; No carotid bruits LYMPHATICS: No lymphadenopathy CARDIAC: RRR, no murmurs, rubs, gallops RESPIRATORY:  Clear to auscultation without rales, wheezing or rhonchi  ABDOMEN: Soft, non-tender, non-distended MUSCULOSKELETAL:  No edema; No deformity  SKIN: Warm and dry NEUROLOGIC:  Alert and oriented x 3 PSYCHIATRIC:  Normal affect   ASSESSMENT:    Paroxysmal Atrial Fibrillation: had recurrent afib. Chads2vasc = 2. Recommended AC. He has no bleeding. Can continue. Continue metoprolol XL 25 mg daily. Will continue with rate control for now.  HTN:  well controlled. Cont norvasc 10 mg daily and BB.  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 Will follow up in a year PLAN:    In order of problems listed above:  No changes Follow-up 6 months           Medication Adjustments/Labs and Tests Ordered: Current medicines are reviewed at length with the  patient today.  Concerns regarding medicines are outlined above.  No orders of the defined types were placed in this encounter.  No orders of the defined types were placed in this encounter.   There are no Patient Instructions on file for this visit.   Signed, Janina Mayo, MD  03/16/2022 11:06 AM    [place]

## 2022-03-16 NOTE — Patient Instructions (Signed)
Medication Instructions:  Your physician recommends that you continue on your current medications as directed. Please refer to the Current Medication list given to you today.  *If you need a refill on your cardiac medications before your next appointment, please call your pharmacy*  Follow-Up: At [facility], you and your health needs are our priority.  As part of our continuing mission to provide you with exceptional heart care, we have created designated Provider Care Teams.  These Care Teams include your primary Cardiologist (physician) and Advanced Practice Providers (APPs -  Physician Assistants and Nurse Practitioners) who all work together to provide you with the care you need, when you need it.  We recommend signing up for the patient portal called "MyChart".  Sign up information is provided on this After Visit Summary.  MyChart is used to connect with patients for Virtual Visits (Telemedicine).  Patients are able to view lab/test results, encounter notes, upcoming appointments, etc.  Non-urgent messages can be sent to your provider as well.   To learn more about what you can do with MyChart, go to [email or web address].    Your next appointment:   6 month(s)  The format for your next appointment:   In Person  Provider:   Janina Mayo, MD

## 2022-03-24 ENCOUNTER — Other Ambulatory Visit: Payer: Self-pay | Admitting: General Surgery

## 2022-03-24 DIAGNOSIS — K651 Peritoneal abscess: Secondary | ICD-10-CM

## 2022-03-30 ENCOUNTER — Other Ambulatory Visit: Payer: Self-pay | Admitting: Family

## 2022-03-30 DIAGNOSIS — E1169 Type 2 diabetes mellitus with other specified complication: Secondary | ICD-10-CM

## 2022-03-31 ENCOUNTER — Ambulatory Visit
Admission: RE | Admit: 2022-03-31 | Discharge: 2022-03-31 | Disposition: A | Discharge status: Home / Self Care | Payer: Commercial Managed Care - HMO | Source: Ambulatory Visit | Attending: General Surgery | Admitting: General Surgery

## 2022-03-31 DIAGNOSIS — K651 Peritoneal abscess: Secondary | ICD-10-CM

## 2022-03-31 HISTORY — PX: IR RADIOLOGIST EVAL & MGMT: IMG5224

## 2022-03-31 NOTE — Progress Notes (Signed)
Referring Physician(s): [facility]  Chief Complaint: The patient is seen in follow up today s/p  intra abdominal abscess-- probable diverticular. Most recent drain placed 11/27/21 +fistula 12/23/21 +fistula 01/12/22  History of present illness:  Pt was seen last at [facility] 01/12/22  Has seen Dr Donne Hazel 01/14/22; 02/09/22; 03/23/22 There really is no evidence that he has any persistent fistula at this time. I think it is reasonable to take the drain out I am just concerned because the last time we did this he got very ill. I am going to send him for a tube study prior to removal. If this is negative then the drain can be removed. Long-term I do think this perforation was related to the small bowel. I will likely send him to gastroenterology for some evaluation after this   Pt is doing well Denies N/V/D Denies fever/chills Has not flushed drain since 01/12/22 OP is none for weeks CT showing resolution of abscess per Dr Dwaine Gale   Past Medical History:  Diagnosis Date   Abscess    Acute blood loss anemia 06/11/1094   Acute metabolic encephalopathy 0/09/5407   Acute renal failure superimposed on stage 3a chronic kidney disease ([place]) 11/07/2021   Acute respiratory failure ([place]) 11/29/2021   Acute right-sided low back pain with bilateral sciatica 03/13/2017   Alteration in electrolyte and fluid balance 11/29/2021   Coffee ground emesis 11/08/2021   Contusion of left hip and thigh 02/13/2017   Hyponatremia 11/07/2021   Osteoarthritis    Pressure injury of skin 11/28/2021   Protein in urine    Septic shock ([place]) 11/08/2021   Sleep apnea    Tobacco abuse 11/08/2021    Past Surgical History:  Procedure Laterality Date   ABSCESS DRAINAGE  02/18/2022   CYSTOSCOPY WITH STENT PLACEMENT Bilateral 11/23/2021   Procedure: CYSTOSCOPY WITH STENT PLACEMENT;  Surgeon: Lucas Mallow, MD;  [facility];  Service: Urology;  Laterality: Bilateral;   IR PATIENT EVAL TECH 0-60 MINS  02/18/2022   IR  RADIOLOGIST EVAL & MGMT  12/23/2021   IR RADIOLOGIST EVAL & MGMT  01/12/2022   LAPAROTOMY N/A 11/23/2021   Procedure: EXPLORATORY LAPAROTOMY, PARTIAL COLECTOMY AND COLOSTOMY;  Surgeon: Rolm Bookbinder, MD;  [facility];  Service: General;  Laterality: N/A;   REPLACEMENT TOTAL HIP W/  RESURFACING IMPLANTS     Left     Allergies: Patient has no known allergies.  Medications: Prior to Admission medications   Medication Sig Start Date End Date Taking? Authorizing Provider  amLODipine (NORVASC) 10 MG tablet Take 1 tablet (10 mg total) by mouth daily. 12/05/21   Danford, Suann Larry, MD  apixaban (ELIQUIS) 5 MG TABS tablet Take 1 tablet (5 mg total) by mouth 2 (two) times daily. 01/26/22   Janina Mayo, MD  ascorbic acid (VITAMIN C) 500 MG tablet Take 1 tablet (500 mg total) by mouth daily. 12/05/21   Danford, Suann Larry, MD  metoprolol succinate (TOPROL-XL) 25 MG 24 hr tablet START WITH 1/2 PILL FOR ONE WEEK, THEN INCREASE TO 1 FULL PILL AT BEDTIME. 02/14/22   Jeanie Sewer, NP  Zinc Citrate-Phytase (ZYTAZE) 25-500 MG CAPS Take by mouth.    [provider]     Family History  Problem Relation Age of Onset   Breast cancer Mother    Lung cancer Father    Colon cancer Neg Hx    Rectal cancer Neg Hx     Social History   Socioeconomic History  Marital status: Married    Spouse name: Not on file   Number of children: 1   Years of education: Not on file   Highest education level: Not on file  Occupational History   Occupation: Artist  Tobacco Use   Smoking status: Every Day    Packs/day: 1.00    Types: Cigarettes   Smokeless tobacco: Never  Vaping Use   Vaping Use: Never used  Substance and Sexual Activity   Alcohol use: No   Drug use: Never   Sexual activity: Not on file  Other Topics Concern   Not on file  Social History Narrative   Not on file   Social Determinants of Health   Financial Resource Strain: Not on file  Food Insecurity: Not on  file  Transportation Needs: Not on file  Physical Activity: Not on file  Stress: Not on file  Social Connections: Not on file     Vital Signs: There were no vitals taken for this visit.  Physical Exam Skin:    General: Skin is warm.     Comments: Site is clean and dry NT no bleeding No sign of infection No OP in bag  Drain flushed easily Saline came out at site Immediatly with gentle flush  Removed without complication Dressing placed     Imaging: No results found.  Labs:  CBC: Recent Labs    12/01/21 1130 12/01/21 1326 12/02/21 1131 12/03/21 0340  WBC 8.1 9.1 9.9 8.9  HGB 7.0* 8.4* 9.1* 8.6*  HCT 22.5* 26.9* 29.0* 27.7*  PLT 165 198 264 239    COAGS: Recent Labs    11/08/21 0822 11/09/21 0616 11/12/21 1327  INR 1.6* 2.1* 1.3*    BMP: Recent Labs    12/03/21 0340 12/04/21 0335 12/05/21 0340 12/06/21 0404  NA 135 136 134* 135  K 4.5 4.1 4.0 4.4  CL 106 107 107 107  CO2 22 22 21* 21*  GLUCOSE 115* 113* 118* 90  BUN 28* 26* 27* 26*  CALCIUM 8.7* 8.9 8.7* 9.0  CREATININE 1.15 1.32* 1.32* 1.32*  GFRNONAA >60 >60 >60 >60    LIVER FUNCTION TESTS: Recent Labs    11/30/21 0342 12/01/21 0436 12/02/21 0341 12/06/21 0404  BILITOT 0.7 0.5 0.6 0.5  AST 35 '29 31 28  '$ ALT '19 18 17 14  '$ ALKPHOS 38 39 39 40  PROT 6.7 6.9 7.1 7.6  ALBUMIN 1.8* 1.8* 1.8* 2.1*    Assessment:  LLQ abscess resolved per CT No OP for weeks--- drain does flush easily Saline comes out immediately with gentle flush Drain removed per Dr Mir Pt to follow with Dr Donne Hazel   Signed: Lavonia Drafts, PA-C 03/31/2022, 1:38 PM   Please refer to Dr. Dwaine Gale attestation of this note for management and plan.

## 2022-04-07 ENCOUNTER — Other Ambulatory Visit: Payer: Self-pay

## 2022-04-07 ENCOUNTER — Encounter: Payer: Self-pay | Admitting: Family

## 2022-04-07 DIAGNOSIS — I10 Essential (primary) hypertension: Secondary | ICD-10-CM

## 2022-04-07 MED ORDER — AMLODIPINE BESYLATE 10 MG PO TABS: 10.0000 mg | ORAL_TABLET | Freq: Every day | ORAL | 3 refills | Status: DC

## 2022-04-20 ENCOUNTER — Telehealth: Payer: Self-pay

## 2022-04-20 NOTE — Telephone Encounter (Signed)
-----  Message from Irene Shipper, MD sent at 04/20/2022 10:13 AM EST ----- Robert Larson  1.  Please order MR enterography "rule out small bowel lesion, fistula" 2.  I will have my nurse schedule him to see me after the new year  Thanks,  Barbette Merino, Please arrange a routine office visit for this patient, Robert Larson, in January. Thanks, Dr. Mamie Nick  ----- Message ----- From: Rolm Bookbinder, MD Sent: 04/19/2022   7:11 PM EST To: Irene Shipper, MD  That sounds good. I appreciate the thoughts!  I think he will be good in next few weeks to do scope- maybe plan early January.  I saw today and dont need to see him back unless something changes.  Should I just refer him now- I'm sure your schedule is pretty full.  MR enterography sounds like a great idea! Maybe we could just do that in interim and I can order.  ----- Message ----- From: Irene Shipper, MD Sent: 04/19/2022   4:04 PM EST To: Rolm Bookbinder, MD; Irene Shipper, MD  [facility]  Doing well thanks. Reviewed.  He has multiple adenomatous colon polyps on last colonoscopy.  Overdue for follow-up.  Significant sigmoid diverticulosis as well.  My thoughts are as follows: 1.  Refer him back for an office visit when you feel he is completely cleared from a surgical perspective. 2.  At that point I will set him up for colonoscopy. 3.  In terms of small bowel, I probably would avoid a capsule given "frozen abdomen".  It may be a high likelihood of capsule retention (which would require surgery for removal). 4.  In lieu of that, could do MR enterography.  This would tell you whether there are any intraluminal small bowel abnormalities, small bowel wall inflammation, and/or fistula. Best and happy holidays,  Robert Larson ----- Message ----- From: Rolm Bookbinder, MD Sent: 04/19/2022   3:21 PM EST To: Irene Shipper, MD  [facility] you are well.  This is patient you have done csc on.  Long story but he was admitted for a couple weeks when I met him  with drains.  Had gone in and then come out then back in.  Presumed diverticulitis. I talked to two of the IR guys and all three of [place] thought this was more likely small bowel.  At over two weeks I tried to operate on him and drained a big pelvic abscess but his abdomen was frozen.  Had an IR drain at the site of what we thought was perforation. This has healed on its own. Drain out.  He is really back to normal. I wanted to see if maybe in January you could do a colonoscopy on him. If that is negative I still think small bowel needs to be looked at (maybe a capsule).  Look at his stuff and let me know what you think and I can refer back. Thanks, [facility]

## 2022-04-20 NOTE — Telephone Encounter (Signed)
Pt scheduled to see Dr. Henrene Pastor 06/21/22 at 2pm.

## 2022-04-25 ENCOUNTER — Encounter: Payer: Self-pay | Admitting: Internal Medicine

## 2022-04-25 ENCOUNTER — Ambulatory Visit (INDEPENDENT_AMBULATORY_CARE_PROVIDER_SITE_OTHER): Payer: Commercial Managed Care - HMO | Admitting: Internal Medicine

## 2022-04-25 ENCOUNTER — Other Ambulatory Visit: Payer: Self-pay

## 2022-04-25 DIAGNOSIS — R932 Abnormal findings on diagnostic imaging of liver and biliary tract: Secondary | ICD-10-CM

## 2022-04-25 DIAGNOSIS — K651 Peritoneal abscess: Secondary | ICD-10-CM

## 2022-04-25 NOTE — Progress Notes (Signed)
[name] for Infectious Disease  CHIEF COMPLAINT:    Follow up for intra-abdominal infection  SUBJECTIVE:    Robert Larson is a 66 y.o. male with PMHx as below who presents to the clinic for intra-abdominal infection.   -Initially managed with IR drain placement 6/5 and 6/15 with cultures growing E. coli, anaerobes, and Candida tropicalis (R to fluc) -Status post ex lap and drainage of pelvic abscess 6/20 (no new cultures).  Found to have a good portion of his abdomen frozen at that time so unable to safely undergo resection of the sigmoid colon or diversion -Repeat CT scan 6/24 given clinical instability showed a interval development of large loculated fluid collection status post IR drainage.  Cultures from this growing E. coli, Bacteroides, and Candida albicans. -Discharged 7/4 on ertapenem and caspafungin -Seen by me 7/18 -- doing well.  Abx extended pending IR and surgery follow up. 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  Drain left in place and plan for repeat catheter injection on 01/12/22.  CT scan showed:  1.) interval resolution of Left Lower Quadrant collection s/p drain placement.   2.) 3.1cm gas/fluid collection at the inferior margin of the midline anterior body wall healing incision 3.) 2.7 cm right hepatic lesion stable since 11/22/21.  Radiology recommended to consider outpatient MR with contrast to differentiate abscess from neoplasm. -Surgery follow up 7/21: planning to manage conservatively for now.  Follow up again in 4 weeks -OPAT labs 7/18: WBC 7.4, Hgb 11.0, creatinine 1.27, GFR 63, ESR 87, CRP 10. -7/25 phone visit: has now been on current IV regimen of ertapenem and caspafungin since last procedure/drain placement 11/27/21.  Continues to overall do well and feel well.  -7/28 MRI liver -- 3.1 cm subscapular right hepatic lobe lesion.  Diff dx includes partially treated abscess, hemangioma.  Less likely  [place]. -8/9 IR follow up: persistent fistula appears to be decreasing.  -8/11 follow up with surgery.  Doing well and abdominal wound noted to be nearly healed. No plans for surgery. - 01/18/22 - continue to feel better and better.  No fevers, chills.  Drain remains in place.  Antibiotics were discontinued.  -03/31/22 - IR follow up.  Abscesses resolved on imaging, no output from abdominal drain and thus it was removed.  - 04/19/22 - surgery follow up.  He is doing very well.  Mostly back to normal.   Please see A&P for the details of today's visit and status of the patient's medical problems.   Patient's Medications  New Prescriptions   No medications on file  Previous Medications   AMLODIPINE (NORVASC) 10 MG TABLET    Take 1 tablet (10 mg total) by mouth daily.   APIXABAN (ELIQUIS) 5 MG TABS TABLET    Take 1 tablet (5 mg total) by mouth 2 (two) times daily.   ASCORBIC ACID (VITAMIN C) 500 MG TABLET    Take 1 tablet (500 mg total) by mouth daily.   METOPROLOL SUCCINATE (TOPROL-XL) 25 MG 24 HR TABLET    START WITH 1/2 PILL FOR ONE WEEK, THEN INCREASE TO 1 FULL PILL AT BEDTIME.   ZINC CITRATE-PHYTASE (ZYTAZE) 25-500 MG CAPS    Take by mouth.  Modified Medications   No medications on file  Discontinued Medications   No medications on file      Past Medical History:  Diagnosis Date   Abscess    Acute blood loss anemia  7/0/0174   Acute metabolic encephalopathy 02/07/4966   Acute renal failure superimposed on stage 3a chronic kidney disease ([place]) 11/07/2021   Acute respiratory failure ([place]) 11/29/2021   Acute right-sided low back pain with bilateral sciatica 03/13/2017   Alteration in electrolyte and fluid balance 11/29/2021   Coffee ground emesis 11/08/2021   Contusion of left hip and thigh 02/13/2017   Hyponatremia 11/07/2021   Osteoarthritis    Pressure injury of skin 11/28/2021   Protein in urine    Septic shock ([place]) 11/08/2021   Sleep apnea    Tobacco abuse 11/08/2021    Social History    Tobacco Use   Smoking status: Former    Packs/day: 1.00    Types: Cigarettes   Smokeless tobacco: Never  Vaping Use   Vaping Use: Never used  Substance Use Topics   Alcohol use: No   Drug use: Never    Family History  Problem Relation Age of Onset   Breast cancer Mother    Lung cancer Father    Colon cancer Neg Hx    Rectal cancer Neg Hx     No Known Allergies  Review of Systems  All other systems reviewed and are negative. Except as noted above.   OBJECTIVE:      Labs and Microbiology:    Latest Ref Rng & Units 12/03/2021    3:40 AM 12/02/2021   11:31 AM 12/01/2021    1:26 PM  CBC  WBC 4.0 - 10.5 K/uL 8.9  9.9  9.1   Hemoglobin 13.0 - 17.0 g/dL 8.6  9.1  8.4   Hematocrit 39.0 - 52.0 % 27.7  29.0  26.9   Platelets 150 - 400 K/uL 239  264  198       Latest Ref Rng & Units 12/06/2021    4:04 AM 12/05/2021    3:40 AM 12/04/2021    3:35 AM  CMP  Glucose 70 - 99 mg/dL 90  118  113   BUN 8 - 23 mg/dL _0 Creatinine 0.61 - 1.24 mg/dL 1.32  1.32  1.32   Sodium 135 - 145 mmol/L 135  134  136   Potassium 3.5 - 5.1 mmol/L 4.4  4.0  4.1   Chloride 98 - 111 mmol/L 107  107  107   CO2 22 - 32 mmol/L _1 Calcium 8.9 - 10.3 mg/dL 9.0  8.7  8.9   Total Protein 6.5 - 8.1 g/dL 7.6     Total Bilirubin 0.3 - 1.2 mg/dL 0.5     Alkaline Phos 38 - 126 U/L 40     AST 15 - 41 U/L 28     ALT 0 - 44 U/L 14          ASSESSMENT & PLAN:    Intra-abdominal abscess (HCC) He is doing well off antibiotics for several months since August 2023 and has been following up with IR and surgery.  No evidence of ongoing intra-abdominal infection at this time and fistula has resolved with subsequent removal of drain.  His last MRI liver in July 2023 noted a 3.1cm subcapsular right hepatic lobe lesion that could've been a partially treated abscess or hemangioma.  Malignancy was felt less likely but recommendation was for repeat imaging  or tissue sampling.  We will get a follow up  MRI at this time and follow up based on the results.    Orders Placed This Encounter  Procedures   MR LIVER W WO CONTRAST    Standing Status:   Future    Standing Expiration Date:   04/26/2023    Order Specific Question:   If indicated for the ordered procedure, I authorize the administration of contrast media per Radiology protocol    Answer:   Yes    Order Specific Question:   What is the patient's sedation requirement?    Answer:   No Sedation    Order Specific Question:   Does the patient have a pacemaker or implanted devices?    Answer:   No    Order Specific Question:   Preferred imaging location?    Answer:   [facility] (table limit - 550 lbs)      [facility] 04/25/2022, 4:22 PM  Virtual Visit via Telephone Note   I connected with Robert Larson on 04/25/22 at 4:22 PM by telephone and verified that I am speaking with the correct person using two identifiers.   I discussed the limitations, risks, security and privacy concerns of performing an evaluation and management service by telephone and the availability of in person appointments. I also discussed with the patient that there may be a patient responsible charge related to this service. The patient expressed understanding and agreed to proceed.  Patient location: home My location: rcid Duration of call: 6 min on call (21 min in chart today)

## 2022-04-25 NOTE — Assessment & Plan Note (Signed)
He is doing well off antibiotics for several months since August 2023 and has been following up with IR and surgery.  No evidence of ongoing intra-abdominal infection at this time and fistula has resolved with subsequent removal of drain.  His last MRI liver in July 2023 noted a 3.1cm subcapsular right hepatic lobe lesion that could've been a partially treated abscess or hemangioma.  Malignancy was felt less likely but recommendation was for repeat imaging  or tissue sampling.  We will get a follow up MRI at this time and follow up based on the results.

## 2022-04-27 ENCOUNTER — Other Ambulatory Visit: Payer: Self-pay | Admitting: General Surgery

## 2022-04-27 DIAGNOSIS — K632 Fistula of intestine: Secondary | ICD-10-CM

## 2022-04-29 ENCOUNTER — Other Ambulatory Visit: Payer: Self-pay | Admitting: Family

## 2022-04-29 DIAGNOSIS — I10 Essential (primary) hypertension: Secondary | ICD-10-CM

## 2022-05-13 ENCOUNTER — Encounter (HOSPITAL_COMMUNITY): Payer: Self-pay

## 2022-05-13 ENCOUNTER — Ambulatory Visit (HOSPITAL_COMMUNITY): Admission: RE | Admit: 2022-05-13 | Payer: Commercial Managed Care - HMO | Source: Ambulatory Visit

## 2022-05-19 ENCOUNTER — Encounter: Payer: Self-pay | Admitting: *Deleted

## 2022-05-22 ENCOUNTER — Ambulatory Visit (HOSPITAL_COMMUNITY)
Admission: RE | Admit: 2022-05-22 | Discharge: 2022-05-22 | Disposition: A | Discharge status: Home / Self Care | Payer: Medicare Other | Source: Ambulatory Visit | Attending: Internal Medicine | Admitting: Internal Medicine

## 2022-05-22 DIAGNOSIS — K651 Peritoneal abscess: Secondary | ICD-10-CM | POA: Diagnosis present

## 2022-05-22 MED ORDER — GADOBUTROL 1 MMOL/ML IV SOLN
10.0000 mL | Freq: Once | INTRAVENOUS | Status: AC | PRN
  Administered 2022-05-22: 10 mL via INTRAVENOUS

## 2022-05-23 ENCOUNTER — Telehealth: Payer: Self-pay

## 2022-05-23 NOTE — Telephone Encounter (Signed)
Per Dr. Juleen China  MRI liver showed a stable lesion in the right lobe of the liver.  Based on the appearance and stability, the radiologist think it is likely a benign hemangioma so does not need further follow up.  I hope you are well.    Patient notified via Mychart and was advised to call the office with any questions.   Robert Mcalpine, LPN

## 2022-06-21 ENCOUNTER — Ambulatory Visit: Payer: Medicare HMO | Admitting: Internal Medicine

## 2022-06-21 ENCOUNTER — Encounter: Payer: Self-pay | Admitting: Internal Medicine

## 2022-06-21 VITALS — BP 140/80 | HR 78 | Ht 67.0 in | Wt 272.0 lb

## 2022-06-21 DIAGNOSIS — K651 Peritoneal abscess: Secondary | ICD-10-CM | POA: Diagnosis not present

## 2022-06-21 DIAGNOSIS — Z860100 Personal history of colon polyps, unspecified: Secondary | ICD-10-CM

## 2022-06-21 DIAGNOSIS — E66813 Obesity, class 3: Secondary | ICD-10-CM

## 2022-06-21 DIAGNOSIS — R932 Abnormal findings on diagnostic imaging of liver and biliary tract: Secondary | ICD-10-CM

## 2022-06-21 DIAGNOSIS — Z7901 Long term (current) use of anticoagulants: Secondary | ICD-10-CM | POA: Diagnosis not present

## 2022-06-21 DIAGNOSIS — R935 Abnormal findings on diagnostic imaging of other abdominal regions, including retroperitoneum: Secondary | ICD-10-CM

## 2022-06-21 NOTE — Patient Instructions (Addendum)
_______________________________________________________  If your blood pressure at your visit was 140/90 or greater, please contact your primary care physician to follow up on this.  _______________________________________________________  If you are age 66 or older, your body mass index should be between 23-30. Your Body mass index is 42.6 kg/m. If this is out of the aforementioned range listed, please consider follow up with your Primary Care Provider.  If you are age 37 or younger, your body mass index should be between 19-25. Your Body mass index is 42.6 kg/m. If this is out of the aformentioned range listed, please consider follow up with your Primary Care Provider.   ________________________________________________________  The [facility] providers would like to encourage you to use [facility] to communicate with providers for non-urgent requests or questions.  Due to long hold times on the telephone, sending your provider a message by [facility] may be a faster and more efficient way to get a response.  Please allow 48 business hours for a response.  Please remember that this is for non-urgent requests.  _______________________________________________________  Robert Larson have been scheduled for an MR Enterography at 8:00am on 06/29/2022 . Your appointment time is 9:00am   . Please arrive to admitting (at main entrance of the hospital) 30 minutes prior to your appointment time for registration purposes. Please make certain not to have anything to eat or drink 4 hours prior to your test. In addition, if you have any metal in your body, have a pacemaker or defibrillator, please be sure to let your ordering physician know. This test typically takes 45 minutes to 1 hour to complete. Should you need to reschedule, please call [phone] to do so.

## 2022-06-21 NOTE — Progress Notes (Signed)
HISTORY OF PRESENT ILLNESS:  Robert Larson is a 67 y.o. 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  Patient sent today, by his general surgeon Dr. Donne Hazel, regarding recent problems with intra-abdominal abscess with sepsis for which she was hospitalized for 1 month (November 07, 2021 through December 07, 2021).  I initially saw the patient in October 2012 for colonoscopy.  At that time he was found to have multiple and advanced adenomatous polyps.  He was lost to follow-up and represented September 2019 with rectal bleeding.  He underwent colonoscopy March 27, 2018.  He was found to have 17 adenomatous polyps between 2 and 12 mm which were removed.  Follow-up in 1 year recommended.  He was also noted to have bleeding internal hemorrhoids to account for his rectal bleeding.  Finally, multiple sigmoid diverticula.  The following is a summary from the infectious disease clinic regarding the patient's course during his hospitalization and thereafter until April 25, 2022: "-Initially managed with IR drain placement 6/5 and 6/15 with cultures growing E. coli, anaerobes, and Candida tropicalis (R to fluc) -Status post ex lap and drainage of pelvic abscess 6/20 (no new cultures).  Found to have a good portion of his abdomen frozen at that time so unable to safely undergo resection of the sigmoid colon or diversion -Repeat CT scan 6/24 given clinical instability showed a interval development of large loculated fluid collection status post IR drainage.  Cultures from this growing E. coli, Bacteroides, and Candida albicans. -Discharged 7/4 on ertapenem and caspafungin -Seen by me 7/18 -- doing well.  Abx extended pending IR and surgery follow up. 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  Drain left in place and plan for repeat catheter injection on 01/12/22.  CT scan showed:  1.) interval resolution of Left Lower Quadrant collection s/p drain placement.   2.) 3.1cm gas/fluid collection at the inferior margin of the midline anterior body wall healing incision 3.) 2.7 cm right hepatic lesion stable since 11/22/21.  Radiology recommended to consider outpatient MR with contrast to differentiate abscess from neoplasm. -Surgery follow up 7/21: planning to manage conservatively for now.  Follow up again in 4 weeks -OPAT labs 7/18: WBC 7.4, Hgb 11.0, creatinine 1.27, GFR 63, ESR 87, CRP 10. -7/25 phone visit: has now been on current IV regimen of ertapenem and caspafungin since last procedure/drain placement 11/27/21.  Continues to overall do well and feel well.  -7/28 MRI liver -- 3.1 cm subscapular right hepatic lobe lesion.  Diff dx includes partially treated abscess, hemangioma.  Less likely [place]. -8/9 IR follow up: persistent fistula appears to be decreasing.  -8/11 follow up with surgery.  Doing well and abdominal wound noted to be nearly healed. No plans for surgery. - 01/18/22 - continue to feel better and better.  No fevers, chills.  Drain remains in place.  Antibiotics were discontinued.  -03/31/22 - IR follow up.  Abscesses resolved on imaging, no output from abdominal drain and thus it was removed.  - 04/19/22 - surgery follow up.  He is doing very well.  Mostly back to normal.    I was contacted by his general surgeon, Dr. Donne Hazel, November.  Etiology of his intra-abdominal abscess was uncertain.  There were some concerns that he possibly had an some sort of small  bowel fistula.  I recommended MR enterography, ongoing convalescence, and follow-up at this time.  Looks like the MR enterography order was entered but the procedure not scheduled.  The patient presents today with his daughter.  He continues to make slow progress.  No abdominal pain.  No longer needing  antibiotics.  He did have transient loose bowel movements which have resolved.  He stopped smoking.  He did have a follow-up MRI of the liver May 22, 2022.  He was found to have changes compatible with cirrhosis and hepatic steatosis.  Previous liver lesion felt to be a benign hemangioma.  Other incidental findings as noted.  REVIEW OF SYSTEMS:  All non-GI ROS negative unless otherwise stated in the HPI except for fatigue, cough  Past Medical History:  Diagnosis Date   Abscess    Acute blood loss anemia 01/10/5642   Acute metabolic encephalopathy 08/05/9516   Acute renal failure superimposed on stage 3a chronic kidney disease ([place]) 11/07/2021   Acute respiratory failure ([place]) 11/29/2021   Acute right-sided low back pain with bilateral sciatica 03/13/2017   Alteration in electrolyte and fluid balance 11/29/2021   Coffee ground emesis 11/08/2021   Contusion of left hip and thigh 02/13/2017   Hyponatremia 11/07/2021   Osteoarthritis    Pressure injury of skin 11/28/2021   Protein in urine    Septic shock ([place]) 11/08/2021   Sleep apnea    Tobacco abuse 11/08/2021    Past Surgical History:  Procedure Laterality Date   ABSCESS DRAINAGE  02/18/2022   CYSTOSCOPY WITH STENT PLACEMENT Bilateral 11/23/2021   Procedure: CYSTOSCOPY WITH STENT PLACEMENT;  Surgeon: Lucas Mallow, MD;  Location: [facility];  Service: Urology;  Laterality: Bilateral;   IR PATIENT EVAL TECH 0-60 MINS  02/18/2022   IR RADIOLOGIST EVAL & MGMT  12/23/2021   IR RADIOLOGIST EVAL & MGMT  01/12/2022   IR RADIOLOGIST EVAL & MGMT  03/31/2022   LAPAROTOMY N/A 11/23/2021   Procedure: EXPLORATORY LAPAROTOMY, PARTIAL COLECTOMY AND COLOSTOMY;  Surgeon: Rolm Bookbinder, MD;  Location: [facility];  Service: General;  Laterality: N/A;   REPLACEMENT TOTAL HIP W/  RESURFACING IMPLANTS     Left     Social History [place]  reports that he has quit smoking. His smoking use included cigarettes. He smoked an average of 1 pack per day. He has  never used smokeless tobacco. He reports that he does not drink alcohol and does not use drugs.  family history includes Breast cancer in his mother; Lung cancer in his father.  No Known Allergies     PHYSICAL EXAMINATION: Vital signs: BP (!) 140/80 (BP Location: Left Arm, Patient Position: Sitting, Cuff Size: Large)   Pulse 78   Ht '5\' 7"'$  (1.702 m)   Wt 272 lb (123.4 kg)   SpO2 96%   BMI 42.60 kg/m   Constitutional: Obese, unhealthy appearing, no acute distress Psychiatric: alert and oriented x3, cooperative Eyes: extraocular movements intact, anicteric, conjunctiva pink Mouth: oral pharynx moist, no lesions Neck: supple no lymphadenopathy Cardiovascular: heart regular rate and rhythm, no murmur Lungs: clear to auscultation bilaterally Abdomen: soft, obese, nontender, nondistended, no obvious ascites, no peritoneal signs, normal bowel sounds, no organomegaly.  Large midline incision healed Rectal: Deferred until colonoscopy Extremities: no clubbing, cyanosis.  1-2+ lower extremity edema bilaterally Skin: no relevant lesions on visible extremities Neuro: No focal deficits. No asterixis.  ASSESSMENT:  1.  Intra-abdominal abscess with sepsis.  Uncertain, but most likely diverticular in origin.  Treated with IR drainage and antibiotics to resolution.  Surgeon had question regarding small bowel disease/fistulous disease. 2.  History of multiple advanced adenomatous colon polyps.  Overdue for follow-up 3.  Multiple comorbidities including morbid obesity and chronic atrial fibrillation on Eliquis   PLAN:  1.  Schedule MR enterography to rule out any significant small bowel pathology 2.  Thereafter, likely need colonoscopy.  The patient is high risk.  This will be performed at the hospital.The nature of the procedure, as well as the risks, benefits, and alternatives were carefully and thoroughly reviewed with the patient. Ample time for discussion and questions allowed. The patient  understood, was satisfied, and agreed to proceed. 3.  Will need cardiology regarding temporary cessation of his Eliquis as he is known to have multiple polyps and would be at significant risk of bleeding with multiple polypectomies on Eliquis.  I will reach out to Dr. Harl Bowie. 4.  Ongoing general medical care with PCP and other specialists  Total time of 65 minutes was spent preparing to see the patient, reviewing myriad of records, laboratories, x-rays, and consultant notes.  Obtaining comprehensive history, performing medically appropriate physical examination, ordering advanced radiology study, discussing colonoscopy which is to be arranged, counseling and educating the patient and his daughter regarding the above listed issues, and documenting clinical information in the health record

## 2022-06-27 ENCOUNTER — Telehealth: Payer: Self-pay | Admitting: Internal Medicine

## 2022-06-27 ENCOUNTER — Ambulatory Visit: Payer: Medicare HMO | Admitting: Orthopedic Surgery

## 2022-06-27 ENCOUNTER — Ambulatory Visit (INDEPENDENT_AMBULATORY_CARE_PROVIDER_SITE_OTHER): Payer: Medicare HMO

## 2022-06-27 ENCOUNTER — Encounter: Payer: Self-pay | Admitting: Orthopedic Surgery

## 2022-06-27 VITALS — Ht 67.0 in | Wt 272.0 lb

## 2022-06-27 DIAGNOSIS — M7552 Bursitis of left shoulder: Secondary | ICD-10-CM | POA: Diagnosis not present

## 2022-06-27 DIAGNOSIS — M25512 Pain in left shoulder: Secondary | ICD-10-CM | POA: Diagnosis not present

## 2022-06-27 MED ORDER — BUPIVACAINE HCL 0.5 % IJ SOLN
9.0000 mL | INTRAMUSCULAR | Status: AC | PRN
  Administered 2022-06-27: 9 mL via INTRA_ARTICULAR

## 2022-06-27 MED ORDER — METHYLPREDNISOLONE ACETATE 40 MG/ML IJ SUSP
40.0000 mg | INTRAMUSCULAR | Status: AC | PRN
  Administered 2022-06-27: 40 mg via INTRA_ARTICULAR

## 2022-06-27 MED ORDER — LIDOCAINE HCL 1 % IJ SOLN
5.0000 mL | INTRAMUSCULAR | Status: AC | PRN
  Administered 2022-06-27: 5 mL

## 2022-06-27 NOTE — Telephone Encounter (Signed)
Patient is calling has some concerns regarding his upcoming MRI. Please advise

## 2022-06-27 NOTE — Progress Notes (Signed)
Office Visit Note   Patient: Robert Larson           Date of Birth: [birth year]           MRN: [id number] Visit Date: 06/27/2022 Requested by: Jeanie Sewer, NP [place],  [place] [id number] PCP: Jeanie Sewer, NP  Subjective: Chief Complaint  Patient presents with   Left Shoulder - Pain    HPI: Robert Larson is a 67 y.o. male who presents to the office reporting left shoulder pain.  He was recently in the hospital for a month due to sepsis.  Had to pull himself up in the bed many times with both shoulders.  Currently he is not on antibiotics.  Describes left shoulder pain all the time.  Worse when he is sleeping at night.  Pain radiates down to the elbow.  Does pop with overhead motion.  Uses Biofreeze at night.  The pain also radiates to his neck at times.  He owns a body shop..                ROS: All systems reviewed are negative as they relate to the chief complaint within the history of present illness.  Patient denies fevers or chills.  Assessment & Plan: Visit Diagnoses:  1. Acute pain of left shoulder     Plan: Impression is left shoulder pain with normal radiographs but slight weakness to external rotation on exam.  I think he may have a least bursitis and possibly acute on chronic rotator cuff pathology.  Plan is subacromial injection today and 6-week return for clinical recheck and decision for or against MRI scanning at that time  Follow-Up Instructions: No follow-ups on file.   Orders:  Orders Placed This Encounter  Procedures   XR Shoulder Left   No orders of the defined types were placed in this encounter.     Procedures: Large Joint Inj: L subacromial bursa on 06/27/2022 7:27 PM Indications: diagnostic evaluation and pain Details: 18 G 1.5 in needle, posterior approach  Arthrogram: No  Medications: 9 mL bupivacaine 0.5 %; 40 mg methylPREDNISolone acetate 40 MG/ML; 5 mL lidocaine 1 % Outcome: tolerated well, no immediate  complications Procedure, treatment alternatives, risks and benefits explained, specific risks discussed. Consent was given by the patient. Immediately prior to procedure a time out was called to verify the correct patient, procedure, equipment, support staff and site/side marked as required. Patient was prepped and draped in the usual sterile fashion.       Clinical Data: No additional findings.  Objective: Vital Signs: Ht '5\' 7"'$  (1.702 m)   Wt 272 lb (123.4 kg)   BMI 42.60 kg/m   Physical Exam:  Constitutional: Patient appears well-developed HEENT:  Head: Normocephalic Eyes:EOM are normal Neck: Normal range of motion Cardiovascular: Normal rate Pulmonary/chest: Effort normal Neurologic: Patient is alert Skin: Skin is warm Psychiatric: Patient has normal mood and affect  Ortho Exam: Ortho exam demonstrates good cervical spine range of motion.  5 out of 5 grip EPL FPL interosseous wrist flexion and extension bicep triceps and deltoid strength.  Has a little bit of popping and coarseness in the left shoulder with forward flexion compared to the right.  External rotation strength 4+ out of 5 on the left 5+ out of 5 on the right.  Subscap strength 5+ out of 5 bilaterally.  Range of motion both shoulders passively is 60/95/165.  Specialty Comments:  No specialty comments available.  Imaging: No results found.  PMFS History: Patient Active Problem List   Diagnosis Date Noted   Peripherally inserted central catheter (PICC) in place 12/21/2021   Abnormal findings on diagnostic imaging of liver 12/10/2021   Obesity, Class III, BMI 40-49.9 (morbid obesity) ([place]) 12/04/2021   Paroxysmal atrial fibrillation with RVR (HCC) 12/04/2021   Acute respiratory failure with hypoxia (H[facility]) 12/04/2021   GERD (gastroesophageal reflux disease) 11/29/2021   OSA (obstructive sleep apnea) 11/08/2021   Osteoarthritis 11/08/2021   CKD (chronic kidney disease) stage 3, GFR 30-59 ml/min (HCC)  11/08/2021   Adrenal nodule ([place]) 11/08/2021   History of adenomatous polyp of colon 11/08/2021   Spinal stenosis of lumbar region 11/08/2021   Renal cyst 11/08/2021   Cholelithiasis 11/08/2021   Intra-abdominal abscess ([place]) 11/07/2021   Diverticulosis 11/07/2021   Essential hypertension 09/16/2021   Type 2 diabetes mellitus with morbid obesity ([place]) 09/16/2021   Cigarette nicotine dependence without complication 54/65/0354   Abnormal weight gain 07/19/2021   Past Medical History:  Diagnosis Date   Abscess    Acute blood loss anemia 11/09/6810   Acute metabolic encephalopathy 12/08/1698   Acute renal failure superimposed on stage 3a chronic kidney disease ([place]) 11/07/2021   Acute respiratory failure ([place]) 11/29/2021   Acute right-sided low back pain with bilateral sciatica 03/13/2017   Alteration in electrolyte and fluid balance 11/29/2021   Coffee ground emesis 11/08/2021   Contusion of left hip and thigh 02/13/2017   Hyponatremia 11/07/2021   Osteoarthritis    Pressure injury of skin 11/28/2021   Protein in urine    Septic shock ([place]) 11/08/2021   Sleep apnea    Tobacco abuse 11/08/2021    Family History  Problem Relation Age of Onset   Breast cancer Mother    Lung cancer Father    Colon cancer Neg Hx    Rectal cancer Neg Hx     Past Surgical History:  Procedure Laterality Date   ABSCESS DRAINAGE  02/18/2022   CYSTOSCOPY WITH STENT PLACEMENT Bilateral 11/23/2021   Procedure: CYSTOSCOPY WITH STENT PLACEMENT;  Surgeon: Lucas Mallow, MD;  [facility];  Service: Urology;  Laterality: Bilateral;   IR PATIENT EVAL TECH 0-60 MINS  02/18/2022   IR RADIOLOGIST EVAL & MGMT  12/23/2021   IR RADIOLOGIST EVAL & MGMT  01/12/2022   IR RADIOLOGIST EVAL & MGMT  03/31/2022   LAPAROTOMY N/A 11/23/2021   Procedure: EXPLORATORY LAPAROTOMY, PARTIAL COLECTOMY AND COLOSTOMY;  Surgeon: Rolm Bookbinder, MD;  Location: [facility];  Service: General;  Laterality: N/A;   REPLACEMENT TOTAL HIP W/  RESURFACING  IMPLANTS     Left    Social History   Occupational History   Occupation: Artist  Tobacco Use   Smoking status: Former    Packs/day: 1.00    Types: Cigarettes   Smokeless tobacco: Never  Vaping Use   Vaping Use: Never used  Substance and Sexual Activity   Alcohol use: No   Drug use: Never   Sexual activity: Not on file

## 2022-06-27 NOTE — Telephone Encounter (Signed)
Pt states he got a call from his insurance company that he only has partial approval for his MRI. Discussed with pt that it is marked approved in epic and that if it was not approved it would have been cancelled. Encouraged him to call the insurance company back to see exactly what they meant, explained he could have a high deductible that he may have to meet. He verbalized understanding and knows to call the insurance co back.

## 2022-06-29 ENCOUNTER — Ambulatory Visit (HOSPITAL_COMMUNITY)
Admission: RE | Admit: 2022-06-29 | Discharge: 2022-06-29 | Disposition: A | Discharge status: Home / Self Care | Payer: Medicare HMO | Source: Ambulatory Visit | Attending: Internal Medicine | Admitting: Internal Medicine

## 2022-06-29 DIAGNOSIS — K651 Peritoneal abscess: Secondary | ICD-10-CM

## 2022-06-29 DIAGNOSIS — R160 Hepatomegaly, not elsewhere classified: Secondary | ICD-10-CM | POA: Diagnosis not present

## 2022-06-29 DIAGNOSIS — K7460 Unspecified cirrhosis of liver: Secondary | ICD-10-CM | POA: Diagnosis not present

## 2022-06-29 DIAGNOSIS — R197 Diarrhea, unspecified: Secondary | ICD-10-CM | POA: Diagnosis not present

## 2022-06-29 MED ORDER — BARIUM SULFATE 0.1 % PO SUSP
1350.0000 mL | Freq: Once | ORAL | Status: AC
  Administered 2022-06-29: 1350 mL via ORAL

## 2022-06-29 MED ORDER — GADOBUTROL 1 MMOL/ML IV SOLN
10.0000 mL | Freq: Once | INTRAVENOUS | Status: AC | PRN
  Administered 2022-06-29: 10 mL via INTRAVENOUS

## 2022-06-30 ENCOUNTER — Encounter: Payer: Self-pay | Admitting: Internal Medicine

## 2022-07-04 ENCOUNTER — Other Ambulatory Visit: Payer: Self-pay

## 2022-07-04 ENCOUNTER — Telehealth: Payer: Self-pay

## 2022-07-04 DIAGNOSIS — Z86010 Personal history of colonic polyps: Secondary | ICD-10-CM

## 2022-07-04 NOTE — Telephone Encounter (Signed)
[facility] Pre-operative Risk Assessment     Request for surgical clearance:     Endoscopy Procedure  What type of surgery is being performed?     Colonoscopy  When is this surgery scheduled?     09/01/2022  What type of clearance is required ?   Pharmacy  Are there any medications that need to be held prior to surgery and how long? Eliquis - 2 days  Practice name and name of physician performing surgery?      [facility]  What is your office phone and fax number?      Phone- [phone]  [phone]  Anesthesia type (None, local, MAC, general) ?       MAC

## 2022-07-04 NOTE — Telephone Encounter (Signed)
Patient with diagnosis of afib on Eliquis for anticoagulation.    Procedure: colonoscopy Date of procedure: 09/01/22  CHA2DS2-VASc Score = 3  This indicates a 3.2% annual risk of stroke. The patient's score is based upon: CHF History: 0 HTN History: 1 Diabetes History: 1 Stroke History: 0 Vascular Disease History: 0 Age Score: 1 Gender Score: 0   CrCl 63m/min using adjusted body weight Platelet count 220K  Per office protocol, patient can hold Eliquis for 2 days prior to procedure as requested.    **This guidance is not considered finalized until pre-operative APP has relayed final recommendations.**

## 2022-07-04 NOTE — Telephone Encounter (Signed)
Spoke to patient to let him know Dr. Henrene Pastor could do his colonoscopy at the hospital on 09/01/2022.  He asked me to relay all the information to his daughter.  Scheduled the procedure and a previsit.  Sent message to preop to hold Eliquis.  Lm on daughter's voicemail to call me back so I can share this information

## 2022-07-04 NOTE — Telephone Encounter (Signed)
   Name: Robert Larson  DOB: 1955/06/16  MRN: [id number]  Primary Cardiologist: Janina Mayo, MD   Preoperative team, please contact this patient and set up a phone call appointment for further preoperative risk assessment. Please obtain consent and complete medication review. Thank you for your help.  I confirm that guidance regarding antiplatelet and oral anticoagulation therapy has been completed and, if necessary, noted below.   Per office protocol, patient can hold Eliquis for 2 days prior to procedure as requested.     Trudi Ida, NP 07/04/2022, 3:37 PM [place]

## 2022-07-05 NOTE — Telephone Encounter (Signed)
I s/w the pt about needing a tele pre op appt for clearance for upcoming procedure 09/01/22. Pt asked me if I could s/w his daughter Francis Gaines to schedule the tele visit.   I left message for Marissa to call back to the pre op team [phone] to schedule tele appt for her dad (the pt).

## 2022-07-05 NOTE — Telephone Encounter (Signed)
Spoke to patient's daughter and communicated the time and date of the hospital colonoscopy and previsit.  I also shared that per cardiologist, patient could hold his Eliquis for 2 days prior to his procedure.  She acknowledged and understood.

## 2022-07-05 NOTE — Telephone Encounter (Signed)
PT IS NOT CLEARED YET UNTIL THEY HAVE TELE PRE OP APPT WITH PRE OP APP.

## 2022-07-07 ENCOUNTER — Telehealth: Payer: Self-pay | Admitting: *Deleted

## 2022-07-07 ENCOUNTER — Telehealth: Payer: Self-pay | Admitting: Internal Medicine

## 2022-07-07 NOTE — Telephone Encounter (Signed)
S/w the pt's daughter [name] who has scheduled tele pre op appt 08/01/22. Med rec and consent are done.

## 2022-07-07 NOTE — Telephone Encounter (Signed)
Daughter is returning call. Please advise  

## 2022-07-07 NOTE — Telephone Encounter (Signed)
S/w the pt's daughter Robert Larson who has scheduled tele pre op appt 08/01/22. Med rec and consent are done.     Patient Consent for Virtual Visit        Robert Larson has provided verbal consent on 07/07/2022 for a virtual visit (video or telephone).   CONSENT FOR VIRTUAL VISIT FOR:  Robert Larson  By participating in this virtual visit I agree to the following:  I hereby voluntarily request, consent and authorize [facility] and its employed or contracted physicians, physician assistants, nurse practitioners or other licensed health care professionals (the Practitioner), to provide me with telemedicine health care services (the "Services") as deemed necessary by the treating Practitioner. I acknowledge and consent to receive the Services by the Practitioner via telemedicine. I understand that the telemedicine visit will involve communicating with the Practitioner through live audiovisual communication technology and the disclosure of certain medical information by electronic transmission. I acknowledge that I have been given the opportunity to request an in-person assessment or other available alternative prior to the telemedicine visit and am voluntarily participating in the telemedicine visit.  I understand that I have the right to withhold or withdraw my consent to the use of telemedicine in the course of my care at any time, without affecting my right to future care or treatment, and that the Practitioner or I may terminate the telemedicine visit at any time. I understand that I have the right to inspect all information obtained and/or recorded in the course of the telemedicine visit and may receive copies of available information for a reasonable fee.  I understand that some of the potential risks of receiving the Services via telemedicine include:  Delay or interruption in medical evaluation due to technological equipment failure or disruption; Information transmitted may not be  sufficient (e.g. poor resolution of images) to allow for appropriate medical decision making by the Practitioner; and/or  In rare instances, security protocols could fail, causing a breach of personal health information.  Furthermore, I acknowledge that it is my responsibility to provide information about my medical history, conditions and care that is complete and accurate to the best of my ability. I acknowledge that Practitioner's advice, recommendations, and/or decision may be based on factors not within their control, such as incomplete or inaccurate data provided by me or distortions of diagnostic images or specimens that may result from electronic transmissions. I understand that the practice of medicine is not an exact science and that Practitioner makes no warranties or guarantees regarding treatment outcomes. I acknowledge that a copy of this consent can be made available to me via my patient portal ([place]), or I can request a printed copy by calling the office of [place] [facility].    I understand that my insurance will be billed for this visit.   I have read or had this consent read to me. I understand the contents of this consent, which adequately explains the benefits and risks of the Services being provided via telemedicine.  I have been provided ample opportunity to ask questions regarding this consent and the Services and have had my questions answered to my satisfaction. I give my informed consent for the services to be provided through the use of telemedicine in my medical care

## 2022-07-07 NOTE — Telephone Encounter (Signed)
See pre op clearance notes

## 2022-07-07 NOTE — Telephone Encounter (Signed)
Left message x 2 to call back to set up tele pre op appt for pt.

## 2022-07-20 DIAGNOSIS — G4733 Obstructive sleep apnea (adult) (pediatric): Secondary | ICD-10-CM | POA: Diagnosis not present

## 2022-07-22 ENCOUNTER — Other Ambulatory Visit: Payer: Self-pay | Admitting: Family

## 2022-07-22 DIAGNOSIS — I10 Essential (primary) hypertension: Secondary | ICD-10-CM

## 2022-07-26 ENCOUNTER — Other Ambulatory Visit: Payer: Self-pay | Admitting: Internal Medicine

## 2022-07-26 ENCOUNTER — Other Ambulatory Visit: Payer: Self-pay | Admitting: Family

## 2022-07-26 DIAGNOSIS — I10 Essential (primary) hypertension: Secondary | ICD-10-CM

## 2022-07-26 NOTE — Telephone Encounter (Signed)
Prescription refill request for Eliquis received. Indication: PAF Last office visit: 03/16/22  Beckie Busing MD Scr: 0.97 on 01/11/22 Age:  67 Weight: 112.9kg  Based on above findings Eliquis 9m twice daily is the appropriate dose.  Refill approved.

## 2022-07-28 DIAGNOSIS — N390 Urinary tract infection, site not specified: Secondary | ICD-10-CM | POA: Diagnosis not present

## 2022-07-28 DIAGNOSIS — N182 Chronic kidney disease, stage 2 (mild): Secondary | ICD-10-CM | POA: Diagnosis not present

## 2022-08-01 ENCOUNTER — Ambulatory Visit: Payer: Medicare HMO | Attending: Cardiology | Admitting: Physician Assistant

## 2022-08-01 DIAGNOSIS — Z01810 Encounter for preprocedural cardiovascular examination: Secondary | ICD-10-CM | POA: Diagnosis not present

## 2022-08-01 NOTE — Progress Notes (Signed)
Virtual Visit via Telephone Note   Because of Robert Larson's co-morbid illnesses, he is at least at moderate risk for complications without adequate follow up.  This format is felt to be most appropriate for this patient at this time.  The patient did not have access to video technology/had technical difficulties with video requiring transitioning to audio format only (telephone).  All issues noted in this document were discussed and addressed.  No physical exam could be performed with this format.  Please refer to the patient's chart for his consent to telehealth for [facility].  Evaluation Performed:  Preoperative cardiovascular risk assessment _____________   Date:  08/01/2022   Patient ID:  Robert Larson, DOB 06-May-1956, MRN [id number] Patient Location:  Home Provider location:   Office  Primary Care Provider:  Jeanie Sewer, NP Primary Cardiologist:  Janina Mayo, MD  Chief Complaint / Patient Profile   67 y.o. y/o Robert Larson, Robert Larson, Robert Larson, Robert Larson, Robert Larson, Robert Larson who is pending colonoscopy and presents today for telephonic preoperative cardiovascular risk assessment.  History of Present Illness    Robert Larson is a 67 y.o. Robert who presents via audio/video conferencing for a telehealth visit today. Robert Carbon, NP evaluated chart intake and recommended virtual visit for clearance. Pt was last seen in cardiology clinic on 03/2022 by Dr. Harl Bowie.  At that time Robert Larson was doing well from cardiac standpoint.  The patient is now pending procedure as outlined above. Since his last visit, he has been doing well. He is back to working full time and walking regularly. Daughter assisted on speakerphone as well. They voice no cardiac complaints and no recent cardioversion procedures.  Past Medical History    Past Medical History:  Diagnosis Date   Abscess    Acute blood loss Robert Larson 0000000   Acute metabolic encephalopathy 0000000    Acute renal failure superimposed on stage 3a chronic kidney disease ([place]) 11/07/2021   Acute respiratory failure ([place]) 11/29/2021   Acute right-sided low back pain with bilateral sciatica 03/13/2017   Alteration in electrolyte and fluid balance 11/29/2021   Coffee ground emesis 11/08/2021   Contusion of left hip and thigh 02/13/2017   Hyponatremia 11/07/2021   Osteoarthritis    Pressure injury of skin 11/28/2021   Protein in urine    Septic shock ([place]) 11/08/2021   Robert Larson    Robert Larson 11/08/2021   Past Surgical History:  Procedure Laterality Date   ABSCESS DRAINAGE  02/18/2022   CYSTOSCOPY WITH STENT PLACEMENT Bilateral 11/23/2021   Procedure: CYSTOSCOPY WITH STENT PLACEMENT;  Surgeon: Lucas Mallow, MD;  Location: [facility];  Service: Urology;  Laterality: Bilateral;   IR PATIENT EVAL TECH 0-60 MINS  02/18/2022   IR RADIOLOGIST EVAL & MGMT  12/23/2021   IR RADIOLOGIST EVAL & MGMT  01/12/2022   IR RADIOLOGIST EVAL & MGMT  03/31/2022   LAPAROTOMY N/A 11/23/2021   Procedure: EXPLORATORY LAPAROTOMY, PARTIAL COLECTOMY AND COLOSTOMY;  Surgeon: Rolm Bookbinder, MD;  Location: [facility];  Service: General;  Laterality: N/A;   REPLACEMENT TOTAL HIP W/  RESURFACING IMPLANTS     Left     Allergies  No Known Allergies  Home Medications    Prior to Admission medications   Medication Sig Start Date End Date Taking? Authorizing Provider  amLODipine (NORVASC) 10 MG tablet Take 1 tablet (10 mg total) by mouth daily. 04/07/22   Jeanie Sewer, NP  apixaban Arne [place])  5 MG TABS tablet TAKE 1 TABLET BY MOUTH TWICE A DAY 07/26/22   Janina Mayo, MD  metoprolol succinate (TOPROL-XL) 25 MG 24 hr tablet START WITH 1/2 PILL FOR ONE WEEK, THEN INCREASE TO 1 FULL PILL AT BEDTIME. 05/02/22   Jeanie Sewer, NP    Physical Exam    Vital Signs:  Robert Larson does not have vital signs available for review today.  Given telephonic nature of communication, physical exam is limited. AAOx3. NAD. Normal  affect.  Speech and respirations are unlabored.  Accessory Clinical Findings    None  Assessment & Plan    1.  Preoperative Cardiovascular Risk Assessment: The patient affirms he has been doing well without any new cardiac symptoms. They are able to achieve over 4 METS without cardiac limitations. Therefore, based on ACC/AHA guidelines, the patient would be at acceptable risk for the planned procedure without further cardiovascular testing. The patient was advised that if he develops new symptoms prior to surgery to contact our office to arrange for a follow-up visit, and he verbalized understanding.  Per pharmD review, patient can hold Eliquis for 2 days prior to procedure as requested.    A copy of this note will be routed to requesting surgeon.  Time:   Today, I have spent 5 minutes with the patient with telehealth technology discussing medical history, symptoms, and management plan.     Charlie Pitter, PA-C  08/01/2022, 10:04 AM

## 2022-08-04 DIAGNOSIS — E669 Obesity, unspecified: Secondary | ICD-10-CM | POA: Diagnosis not present

## 2022-08-04 DIAGNOSIS — R7303 Prediabetes: Secondary | ICD-10-CM | POA: Diagnosis not present

## 2022-08-04 DIAGNOSIS — Z720 Tobacco use: Secondary | ICD-10-CM | POA: Diagnosis not present

## 2022-08-04 DIAGNOSIS — Z6841 Body Mass Index (BMI) 40.0 and over, adult: Secondary | ICD-10-CM | POA: Diagnosis not present

## 2022-08-04 DIAGNOSIS — R809 Proteinuria, unspecified: Secondary | ICD-10-CM | POA: Diagnosis not present

## 2022-08-04 DIAGNOSIS — N182 Chronic kidney disease, stage 2 (mild): Secondary | ICD-10-CM | POA: Diagnosis not present

## 2022-08-04 DIAGNOSIS — L578 Other skin changes due to chronic exposure to nonionizing radiation: Secondary | ICD-10-CM | POA: Diagnosis not present

## 2022-08-04 DIAGNOSIS — G4733 Obstructive sleep apnea (adult) (pediatric): Secondary | ICD-10-CM | POA: Diagnosis not present

## 2022-08-04 DIAGNOSIS — I10 Essential (primary) hypertension: Secondary | ICD-10-CM | POA: Diagnosis not present

## 2022-08-04 DIAGNOSIS — N049 Nephrotic syndrome with unspecified morphologic changes: Secondary | ICD-10-CM | POA: Diagnosis not present

## 2022-08-04 DIAGNOSIS — E7800 Pure hypercholesterolemia, unspecified: Secondary | ICD-10-CM | POA: Diagnosis not present

## 2022-08-05 IMAGING — CT CT IMAGE GUIDED DRAINAGE BY PERCUTANEOUS CATHETER
1 of 7 series · 10 of 32 positions shown, 16 images · non-contrast
Comparison: none

INDICATION: 65-year-old with intra-abdominal abscesses, suspect diverticular
origin but etiology is unclear. Previous right transgluteal drain
was removed. Re-accumulation of an air-fluid collection in the
pelvis along with additional collections throughout the abdomen.

[Series 2: i-spiral 5.0 bf37 · axial · 0.98mm/px · z∈[-340,-36]mm · 10 of 107 slices shown, 16 images]
[im 10/107  soft-tissue]
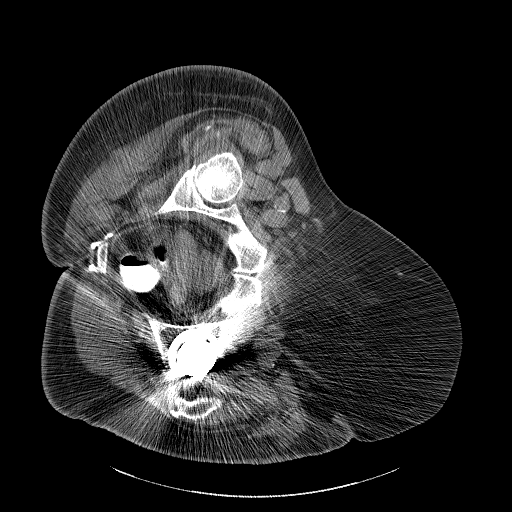
[im 10/107  bone]
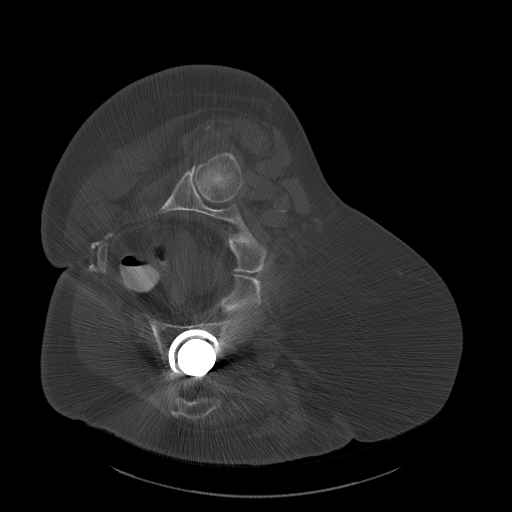
[im 20/107  soft-tissue]
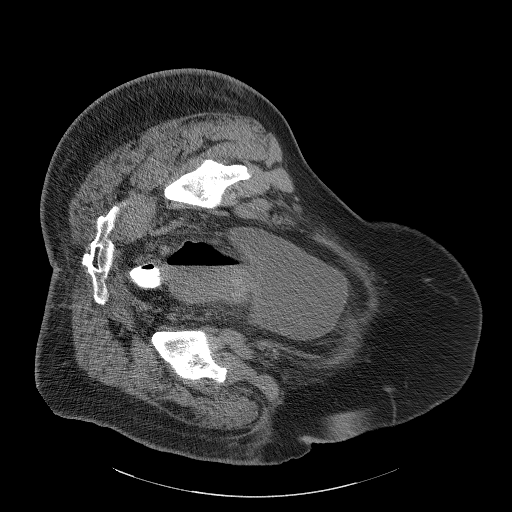
[im 29/107  soft-tissue]
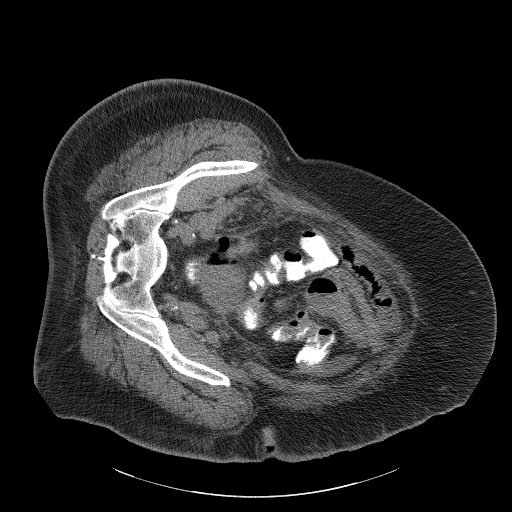
[im 39/107  soft-tissue]
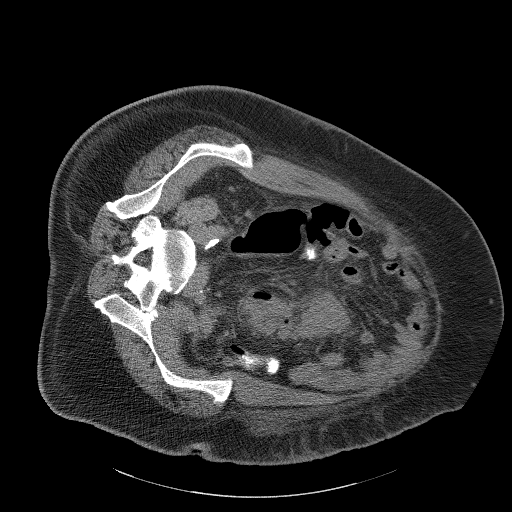
[im 49/107  soft-tissue]
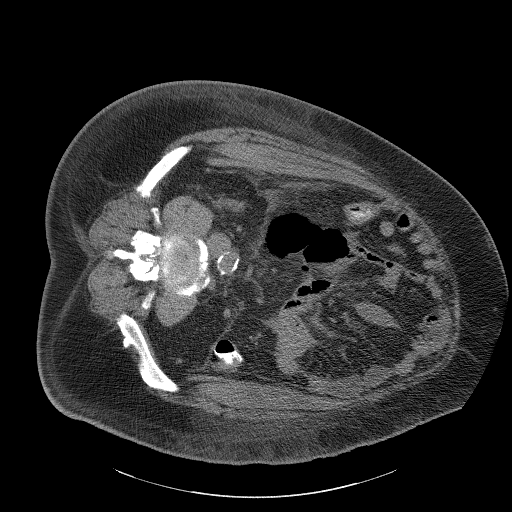
[im 58/107  soft-tissue]
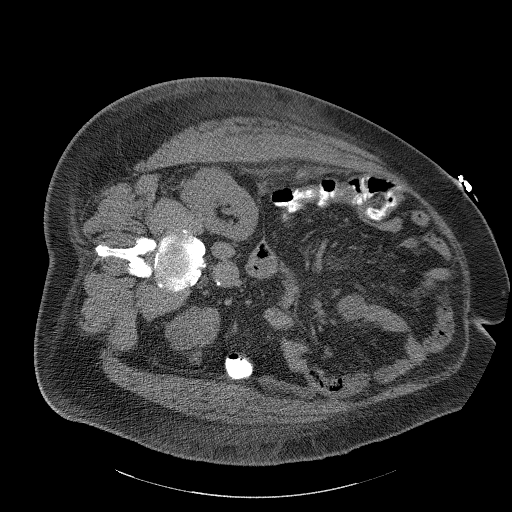
[im 68/107  soft-tissue]
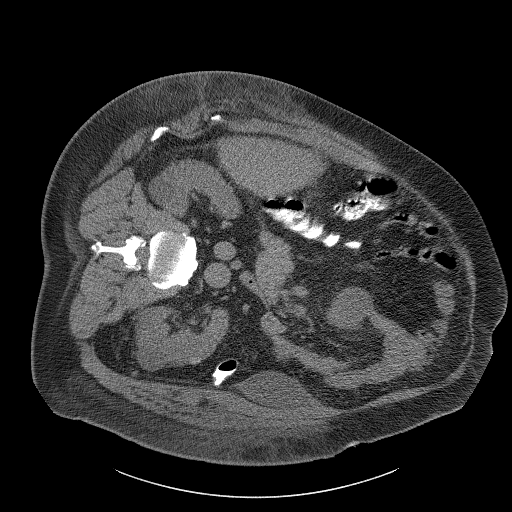
[im 68/107  lung]
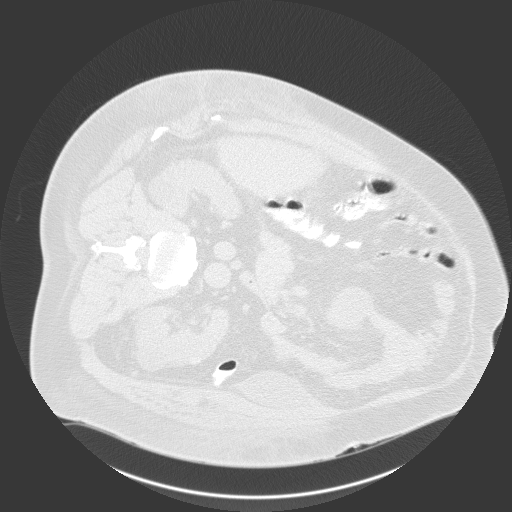
[im 78/107  soft-tissue]
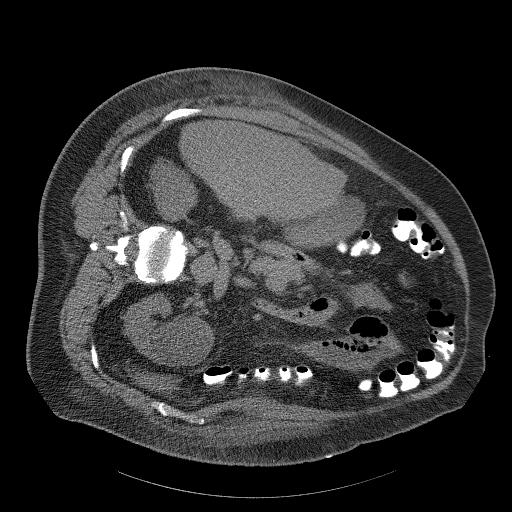
[im 78/107  lung]
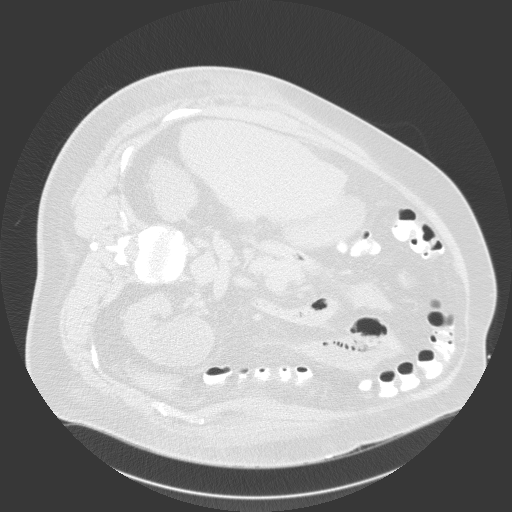
[im 87/107  soft-tissue]
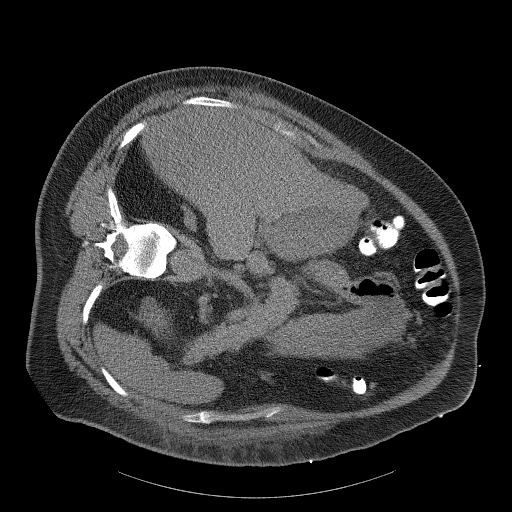
[im 87/107  lung]
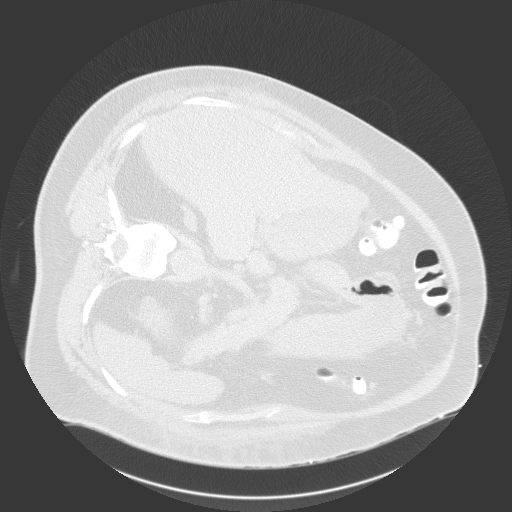
[im 87/107  bone]
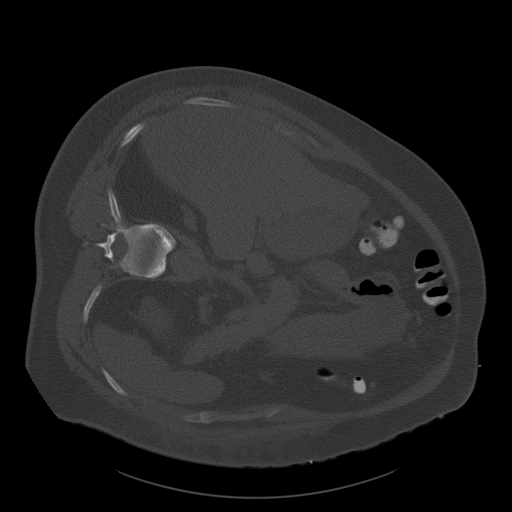
[im 97/107  soft-tissue]
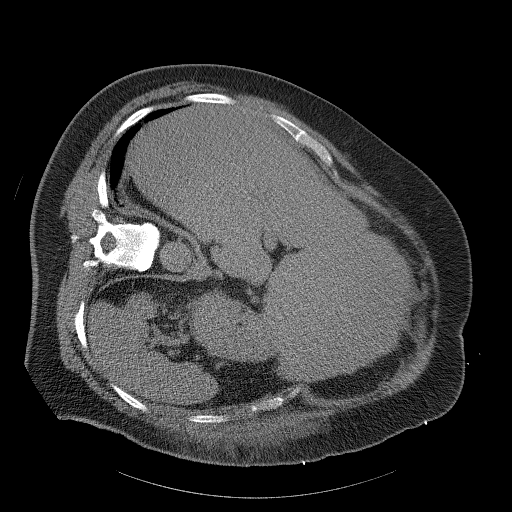
[im 97/107  lung]
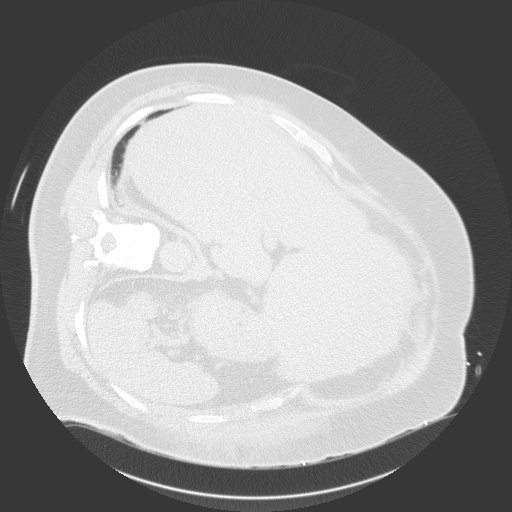

[10 of 32 positions shown; findings below may reference images not displayed]

EXAM:
1. CT-guided placement of a drain in the right lower abdominal
abscess
2. CT-guided placement of a drain in left lateral abdominal abscess

MEDICATIONS:
Moderate sedation

ANESTHESIA/SEDATION:
Moderate (conscious) sedation was employed during this procedure. A
total of Versed 4.0mg and fentanyl 150 mcg was administered
intravenously at the order of the provider performing the procedure.

Total intra-service moderate sedation time: 66 minutes.

Patient's level of consciousness and vital signs were monitored
continuously by radiology nurse throughout the procedure under the
supervision of the provider performing the procedure.

COMPLICATIONS:
None immediate.

PROCEDURE:
Informed written consent was obtained from the patient after a
thorough discussion of the procedural risks, benefits and
alternatives. All questions were addressed. Maximal Sterile Barrier
Technique was utilized including caps, mask, sterile gowns, sterile
gloves, sterile drape, hand hygiene and skin antiseptic. A timeout
was performed prior to the initiation of the procedure.

Patient was placed in left lateral decubitus position. Images
through the abdomen and pelvis were obtained. The large air-fluid
collection the right lower quadrant of the abdomen was targeted. The
right side of the abdomen was prepped with chlorhexidine and sterile
field was created. Skin was anesthetized with 1% lidocaine. Small
incision was made. Using CT guidance, 18 gauge trocar needle was
directed into the collection and superstiff Amplatz wire was
advanced into the collection. The tract was dilated to accommodate
12 French drain. Approximately 80 mL of brown purulent fluid was
removed from this collection and follow up CT images were obtained.
This drain appeared to be decompressing not only the right lower
quadrant abscess but also the pelvic abscess. Therefore, another
transgluteal drain was not placed. The catheter was sutured to skin
and attached to a suction bulb. Fluid was collected for culture.

The patient was rolled into the onto his back and images were
obtained in the supine position. The fluid collection along the
lateral left abdomen was targeted. The left side of the abdomen was
prepped chlorhexidine and sterile field was created. Maximal barrier
sterile technique was utilized including caps, mask, sterile gowns,
sterile gloves, sterile drape, hand hygiene and skin antiseptic.
Skin was anesthetized with 1% lidocaine. Small incision was made.
Using CT guidance, an 18 gauge trocar needle was directed into the
collection and cloudy yellow fluid was aspirated. Superstiff Amplatz
wire was advanced and the tract was dilated to accommodate a 10
French multipurpose drain. Approximately 30 mL of cloudy yellow
purulent fluid was removed. Follow up CT images were obtained. Drain
was sutured to skin and attached to a suction bulb. Fluid was sent
for culture.

RADIATION DOSE REDUCTION: This exam was performed according to the
departmental dose-optimization program which includes automated
exposure control, adjustment of the mA and/or kV according to
patient size and/or use of iterative reconstruction technique.
FINDINGS: 12 French drain placed in the large gas-filled collection in the
right lower quadrant. 80 mL of purulent fluid was removed from this
drain. 12 French drain was decompressing the right lower quadrant
abscess as well as the pelvic abscess.

10 French drain placed in left lateral abdominal abscess. 30 mL of
cloudy yellow fluid was removed from this drain.

Enlargement of the right piriformis muscle compatible with a
hematoma formation from the previous transgluteal drain.
IMPRESSION: 1. CT-guided placement of abscess drain in the right lower quadrant
abscess. This drain appears to be decompressing the right lower
quadrant abscess and the pelvic abscess.
2. CT-guided placement of a drainage catheter in the left lateral
abdominal abscess.

## 2022-08-08 ENCOUNTER — Telehealth: Payer: Self-pay

## 2022-08-08 NOTE — Telephone Encounter (Signed)
Spoke with patient and told him that per cardiology, he could his Eliquis for 2 days prior to his procedure.  Patient agreed

## 2022-08-09 IMAGING — CT CT ABD-PELV W/ CM
2 of 5 series · 14 of 46 positions shown, 16 images · IV contrast (agent unspecified)
Comparison: 11/18/2021 and earlier

CLINICAL DATA: Abdominal pain.

EXAM:
CT ABDOMEN AND PELVIS WITH CONTRAST
TECHNIQUE: Multidetector CT imaging of the abdomen and pelvis was performed
using the standard protocol following bolus administration of
intravenous contrast.

[Series 2: axial st · axial · 0.97mm/px · z∈[-490,-85]mm · 11 of 95 slices shown, 13 images]
[im 7/95  soft-tissue]
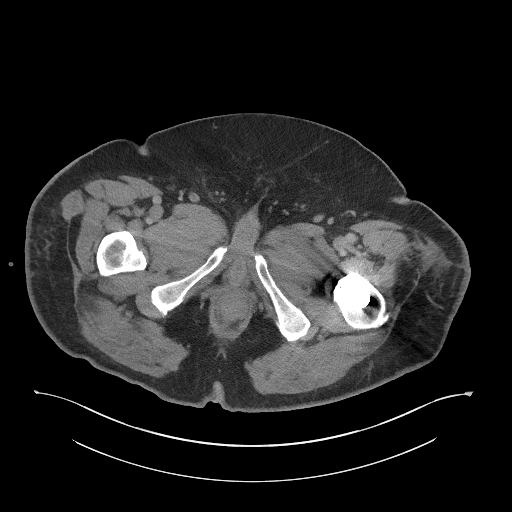
[im 7/95  bone]
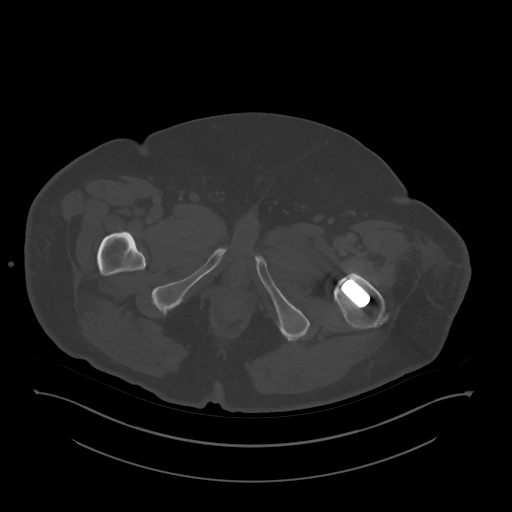
[im 13/95  soft-tissue]
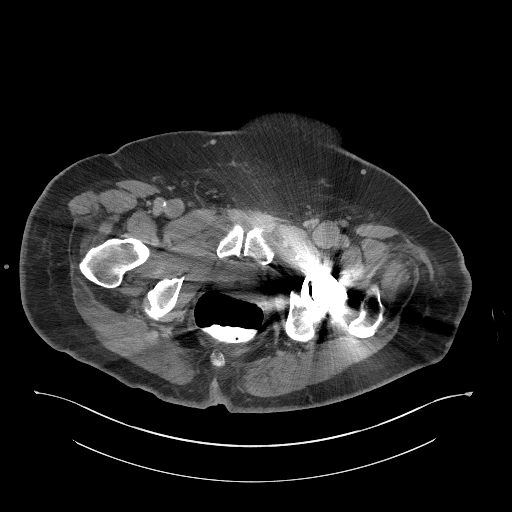
[im 26/95  soft-tissue]
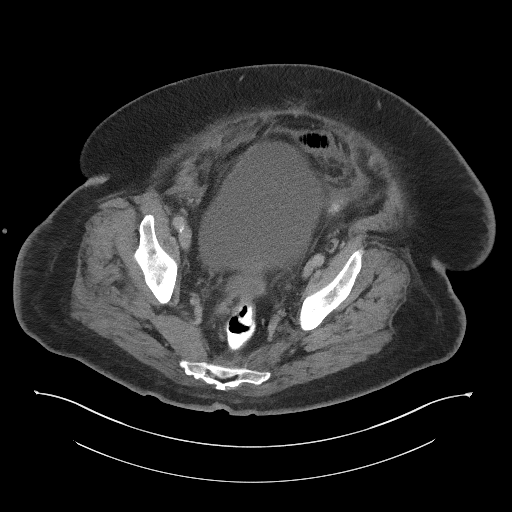
[im 32/95  soft-tissue]
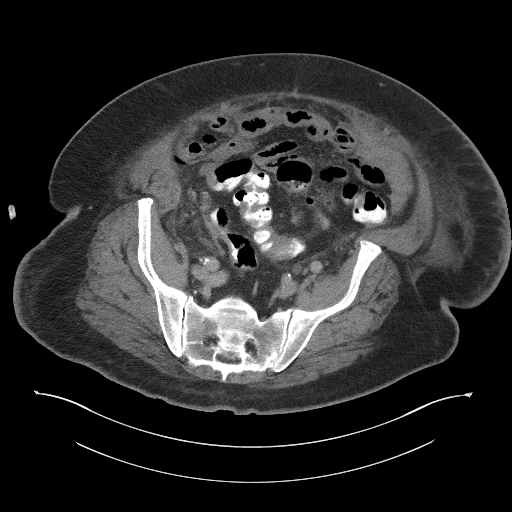
[im 38/95  soft-tissue]
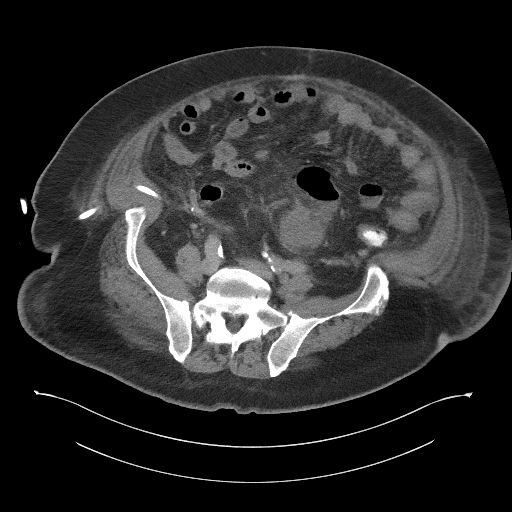
[im 51/95  soft-tissue]
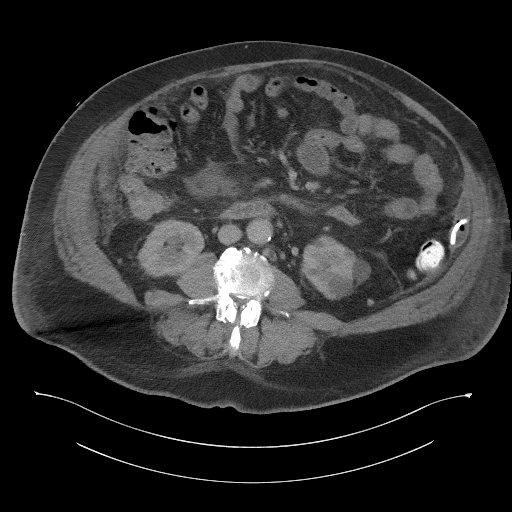
[im 57/95  soft-tissue]
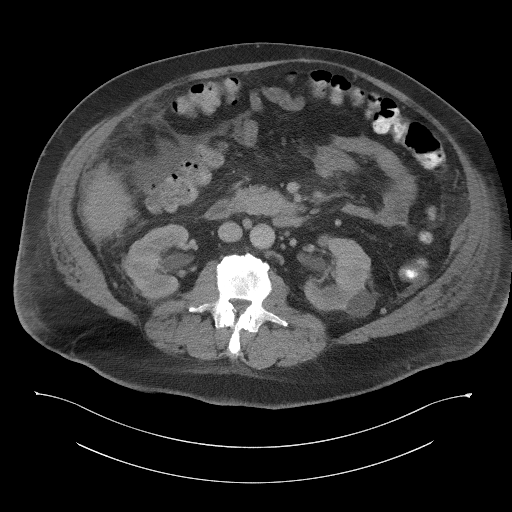
[im 63/95  soft-tissue]
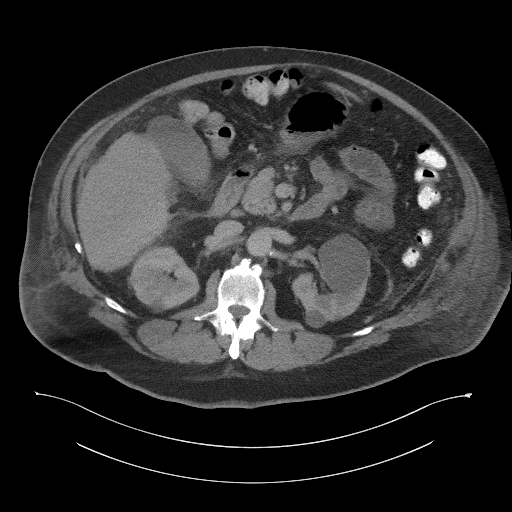
[im 69/95  soft-tissue]
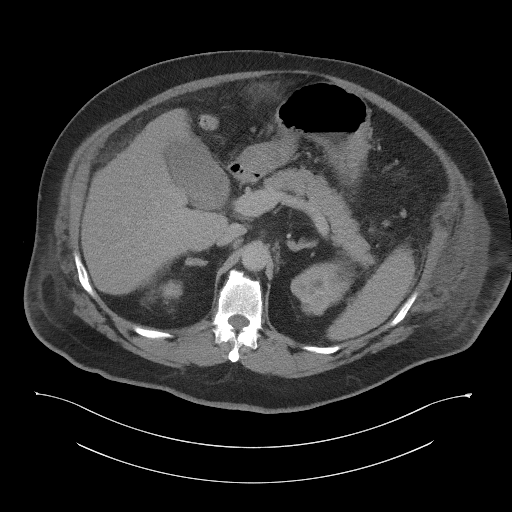
[im 69/95  bone]
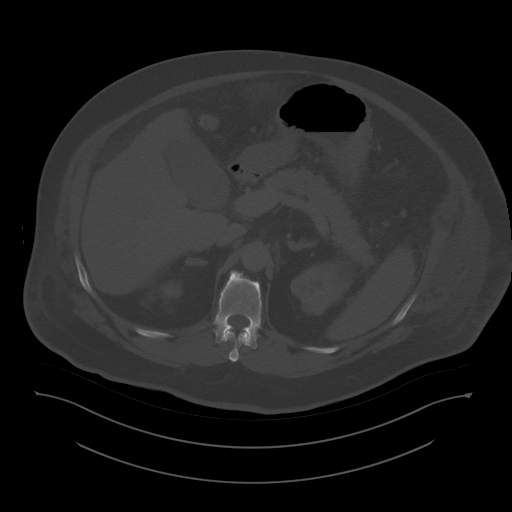
[im 82/95  soft-tissue]
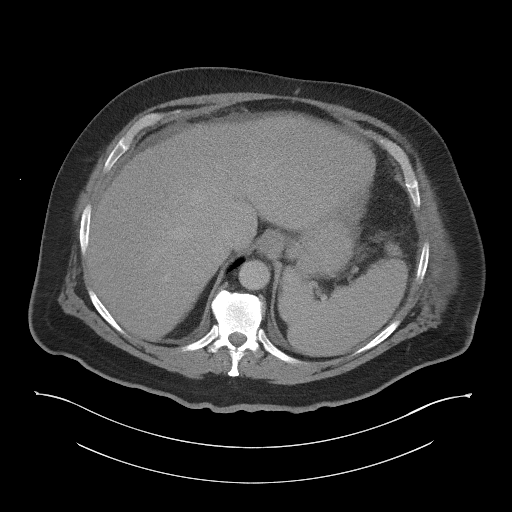
[im 88/95  soft-tissue]
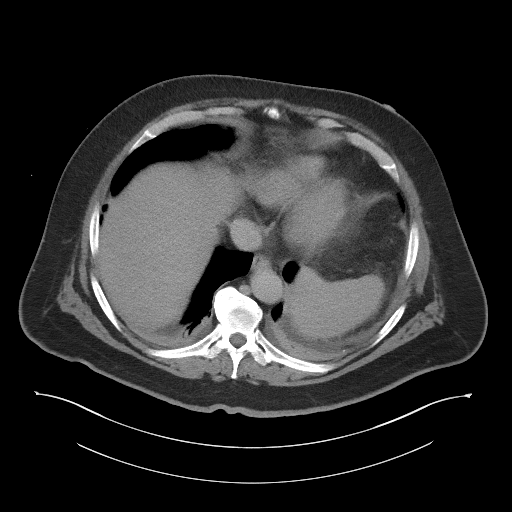

[Series 5: coronal st · coronal · 0.93mm/px · 3 of 131 slices shown]
[im 44/131  soft-tissue]
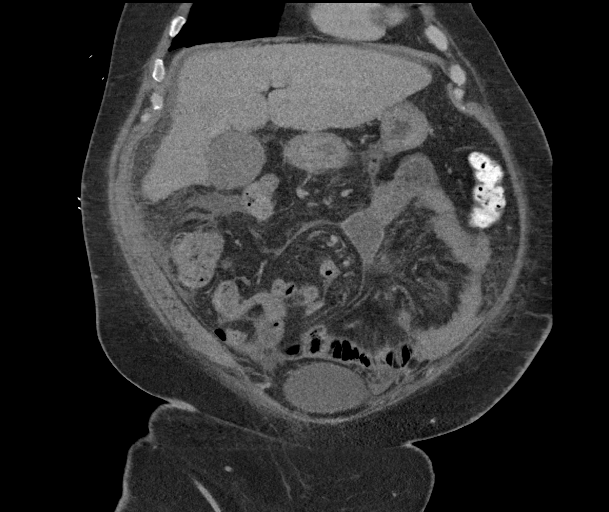
[im 58/131  soft-tissue]
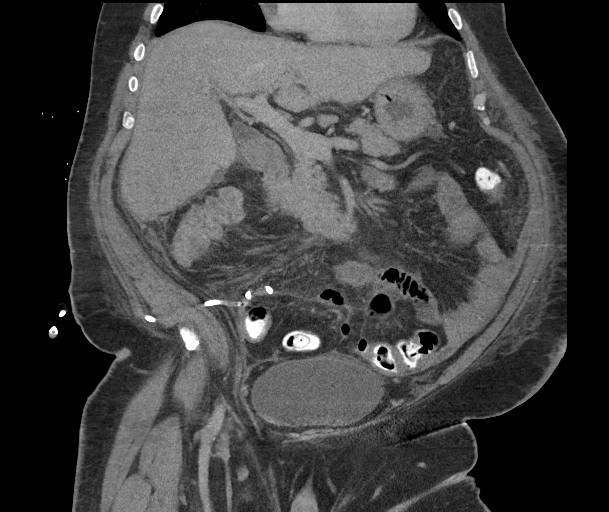
[im 73/131  soft-tissue]
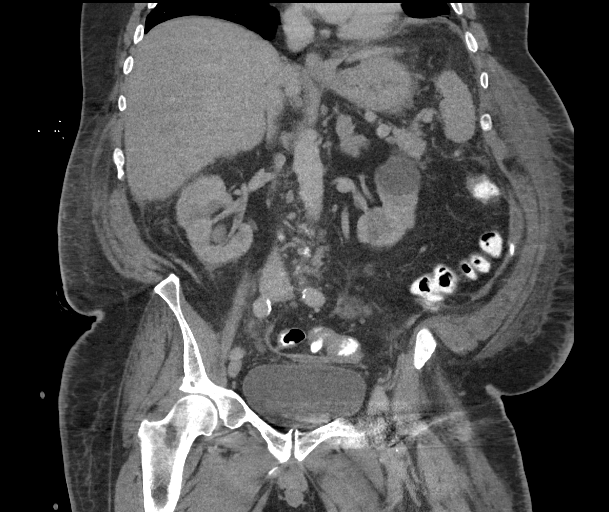

[14 of 46 positions shown; findings below may reference images not displayed]

RADIATION DOSE REDUCTION: This exam was performed according to the
departmental dose-optimization program which includes automated
exposure control, adjustment of the mA and/or kV according to
patient size and/or use of iterative reconstruction technique.

CONTRAST:  100mL OMNIPAQUE IOHEXOL 300 MG/ML  SOLN
FINDINGS: Lower chest: There are bilateral pleural effusions. Bibasilar
atelectasis and LEFT LOWER lobe consolidation are similar to prior
study. Heart size is normal.

Hepatobiliary: Cirrhotic contour of the liver. A 3.1 centimeter
low-attenuation lesion is identified in the posterior segment of the
RIGHT hepatic lobe, not seen on prior noncontrast exams. Further
evaluation is indicated.

Gallbladder is distended.  No radiopaque calculi.

Pancreas: Unremarkable. No pancreatic ductal dilatation or
surrounding inflammatory changes.

Spleen: Normal in size without focal abnormality.

Adrenals/Urinary Tract: A 1.1 centimeter nodule in the RIGHT adrenal
gland is stable. A 2.5 centimeter nodule in the LEFT adrenal gland
is stable.

Numerous bilateral renal cysts are present. Largest is in the LEFT
kidney, 5.9 centimeters. No hydronephrosis. Ureters are
unremarkable.

Urinary bladder is distended and otherwise normal.

Stomach/Bowel: Stomach and small bowel loops are normal in
appearance. Contrast is seen in the colon to the level of the anus.
There are numerous colonic diverticula. There is focal wall
thickening in the region of the redundant sigmoid colon. There is
moderate mesenteric edema. Small amount of mesenteric fluid
identified adjacent to the hepatic segment of the colon and in the
RIGHT UPPER QUADRANT, without evidence for abscess. The largest
fluid collection is 7.3 x 2.9 x 2.0 centimeters. RIGHT LOWER
QUADRANT a drain is in place. No significant fluid collections
around the drain. There has been almost complete resolution of
air-fluid collection between the bladder in the rectum (image 69 of
series 2 and 74 of series 6). This collection now measures 6.2 by
2.2 centimeters. Previously, this measured 9.4 x 6.6 centimeters.
Stable appearance of small air-fluid collection in the LEFT central
pelvis measuring 6.4 x 3.5 centimeters (image 57 of series 2).
Significantly smaller oval collection of fluid contains pigtail
drain in the LEFT mid abdomen, now measuring 5.5 x 1.7 centimeters
and previously 6.3 centimeters. (Image 46 of series 2).

Vascular/Lymphatic: There is atherosclerotic calcification of the
abdominal aorta. Small para aortic lymph nodes are likely reactive.

Reproductive: Prostate is unremarkable.

Other: There is edema of the LATERAL abdominal wall bilaterally,
LEFT greater than RIGHT.

Musculoskeletal: Degenerative changes in the LOWER lumbar spine.
Prior LEFT hip arthroplasty.
IMPRESSION: 1. Improving multiple fluid collections within the abdomen and
pelvis.
2. No significant fluid around the RIGHT LOWER lobe drain.
3. Small fluid around the LEFT LOWER lobe drain, with significant
improvement in this collection.
4. Near complete resolution of air fluid collection between the
rectum and the bladder.
5. Stable small collection in the central pelvis.
6. Diverticulosis.
7. Indeterminate low-attenuation lesion in the RIGHT hepatic lobe
measuring 3.1 centimeters. This has not been fully characterized. In
the setting of cirrhosis, further characterization is needed.
Recommend non emergent outpatient MRI of the liver.
8.  Aortic atherosclerosis.  ([id number]-542.2)
9. Abdominal wall edema.
10. Bilateral pleural effusions, bibasilar atelectasis, and LEFT
LOWER lobe consolidation are similar.
11. Stable bilateral adrenal nodules.

## 2022-08-10 ENCOUNTER — Ambulatory Visit: Payer: Medicare HMO | Admitting: Orthopedic Surgery

## 2022-08-16 ENCOUNTER — Telehealth: Payer: Self-pay | Admitting: *Deleted

## 2022-08-16 NOTE — Telephone Encounter (Signed)
Yesi,   This pt is a documented difficult intubation and his procedure will need to be done at the hospital.   Thanks,  Ronnette Rump 

## 2022-08-18 DIAGNOSIS — G4733 Obstructive sleep apnea (adult) (pediatric): Secondary | ICD-10-CM | POA: Diagnosis not present

## 2022-08-19 ENCOUNTER — Other Ambulatory Visit: Payer: Self-pay

## 2022-08-19 ENCOUNTER — Encounter: Payer: Self-pay | Admitting: Family

## 2022-08-19 DIAGNOSIS — Z809 Family history of malignant neoplasm, unspecified: Secondary | ICD-10-CM | POA: Diagnosis not present

## 2022-08-19 DIAGNOSIS — I4891 Unspecified atrial fibrillation: Secondary | ICD-10-CM | POA: Diagnosis not present

## 2022-08-19 DIAGNOSIS — I10 Essential (primary) hypertension: Secondary | ICD-10-CM | POA: Diagnosis not present

## 2022-08-19 DIAGNOSIS — Z008 Encounter for other general examination: Secondary | ICD-10-CM | POA: Diagnosis not present

## 2022-08-19 DIAGNOSIS — D6869 Other thrombophilia: Secondary | ICD-10-CM | POA: Diagnosis not present

## 2022-08-19 DIAGNOSIS — Z8249 Family history of ischemic heart disease and other diseases of the circulatory system: Secondary | ICD-10-CM | POA: Diagnosis not present

## 2022-08-19 DIAGNOSIS — Z87891 Personal history of nicotine dependence: Secondary | ICD-10-CM | POA: Diagnosis not present

## 2022-08-19 DIAGNOSIS — N529 Male erectile dysfunction, unspecified: Secondary | ICD-10-CM | POA: Diagnosis not present

## 2022-08-19 DIAGNOSIS — Z7901 Long term (current) use of anticoagulants: Secondary | ICD-10-CM | POA: Diagnosis not present

## 2022-08-19 DIAGNOSIS — Z6841 Body Mass Index (BMI) 40.0 and over, adult: Secondary | ICD-10-CM | POA: Diagnosis not present

## 2022-08-19 DIAGNOSIS — Z96649 Presence of unspecified artificial hip joint: Secondary | ICD-10-CM | POA: Diagnosis not present

## 2022-08-19 MED ORDER — METOPROLOL SUCCINATE ER 25 MG PO TB24: ORAL_TABLET | ORAL | 2 refills | Status: DC

## 2022-08-19 MED ORDER — AMLODIPINE BESYLATE 10 MG PO TABS: 10.0000 mg | ORAL_TABLET | Freq: Every day | ORAL | 3 refills | Status: DC

## 2022-08-23 ENCOUNTER — Ambulatory Visit (AMBULATORY_SURGERY_CENTER): Payer: Medicare HMO | Admitting: *Deleted

## 2022-08-23 ENCOUNTER — Encounter: Payer: Self-pay | Admitting: Internal Medicine

## 2022-08-23 VITALS — Ht 67.0 in | Wt 270.0 lb

## 2022-08-23 DIAGNOSIS — Z86010 Personal history of colonic polyps: Secondary | ICD-10-CM

## 2022-08-23 DIAGNOSIS — K651 Peritoneal abscess: Secondary | ICD-10-CM

## 2022-08-23 MED ORDER — NA SULFATE-K SULFATE-MG SULF 17.5-3.13-1.6 GM/177ML PO SOLN: 1.0000 | Freq: Once | ORAL | 0 refills | Status: AC

## 2022-08-23 NOTE — Progress Notes (Signed)
Pt's previsit is done over the phone and all paperwork (prep instructions) sent to patient.  Pt's name and DOB verified at the beginning of the previsit.  Pt denies any difficulty with ambulating.   Daughter, [name], is on call as well  No egg or soy allergy known to patient  No issues known to pt with past sedation with any surgeries or procedures Patient has hx of difficult intubation No FH of Malignant Hyperthermia Pt is not on diet pills Pt is not on  home 02  Pt is not on blood thinners  Pt denies issues with constipation  Pt is not on dialysis Pt denies any upcoming cardiac testing Pt encouraged to use to use Singlecare or Goodrx to reduce cost

## 2022-08-24 ENCOUNTER — Encounter: Payer: Self-pay | Admitting: Family

## 2022-08-24 ENCOUNTER — Ambulatory Visit (INDEPENDENT_AMBULATORY_CARE_PROVIDER_SITE_OTHER): Payer: Medicare HMO | Admitting: Family

## 2022-08-24 VITALS — BP 152/89 | HR 56 | Temp 98.0°F | Ht 67.0 in | Wt 275.2 lb

## 2022-08-24 DIAGNOSIS — E1169 Type 2 diabetes mellitus with other specified complication: Secondary | ICD-10-CM | POA: Diagnosis not present

## 2022-08-24 DIAGNOSIS — I10 Essential (primary) hypertension: Secondary | ICD-10-CM

## 2022-08-24 LAB — LDL CHOLESTEROL, DIRECT: Direct LDL: 104 mg/dL

## 2022-08-24 LAB — LIPID PANEL
Cholesterol: 189 mg/dL (ref 0–200)
HDL: 38.4 mg/dL — ABNORMAL LOW (ref >39.00)
NonHDL: 150.62
Total CHOL/HDL Ratio: 5
Triglycerides: 293 mg/dL — ABNORMAL HIGH (ref 0.0–149.0)
VLDL: 58.6 mg/dL — ABNORMAL HIGH (ref 0.0–40.0)

## 2022-08-24 LAB — COMPREHENSIVE METABOLIC PANEL
ALT: 12 U/L (ref 0–53)
AST: 19 U/L (ref 0–37)
Albumin: 3 g/dL — ABNORMAL LOW (ref 3.5–5.2)
Alkaline Phosphatase: 87 U/L (ref 39–117)
BUN: 11 mg/dL (ref 6–23)
CO2: 22 mEq/L (ref 19–32)
Calcium: 9.1 mg/dL (ref 8.4–10.5)
Chloride: 106 mEq/L (ref 96–112)
Creatinine, Ser: 1.46 mg/dL (ref 0.40–1.50)
GFR: 50.22 mL/min — ABNORMAL LOW (ref >60.00)
Glucose, Bld: 102 mg/dL — ABNORMAL HIGH (ref 70–99)
Potassium: 4.3 mEq/L (ref 3.5–5.1)
Sodium: 137 mEq/L (ref 135–145)
Total Bilirubin: 0.4 mg/dL (ref 0.2–1.2)
Total Protein: 6.6 g/dL (ref 6.0–8.3)

## 2022-08-24 LAB — HEMOGLOBIN A1C: Hgb A1c MFr Bld: 5.9 % (ref 4.6–6.5)

## 2022-08-24 NOTE — Assessment & Plan Note (Signed)
chronic  pt weight dropped down to 242 last year after 1 month in hospital wt back up to 275 continue to advise on wt loss strategies checking labs today f/u prn

## 2022-08-24 NOTE — Progress Notes (Signed)
Patient ID: Robert Larson, male    DOB: [birth year], 67 y.o.   MRN: [id number]  Chief Complaint  Patient presents with   Hypertension    W/ Labs   HPI: Hypertension: Patient is currently maintained on the following medications for blood pressure: Amlodipine, Metoprolol.  Failed meds include: Telmisartan-HCTZ stopped, Telmisartan restarted by Dr. Loletha Grayer Patient reports good compliance with blood pressure medications. Patient denies chest pain, headaches, shortness of breath or swelling.  Assessment & Plan:  Essential hypertension Assessment & Plan: Chronic taking Amlodipine 10mg  and Metoprolol 25mg  ER, Telmisartan 40mg  BP elevated today still followed by nephrology, sees every 39mos, states seen 2w ago w/labs, but no records in chart checking labs today f/u 6 mos - best to alternate with nephrology   Orders: -     Hemoglobin A1c -     Lipid panel  Type 2 diabetes mellitus with morbid obesity ([place]) Assessment & Plan: had been in remission since bowel obstr last year pt has regained all his weight not checking CBGs at home will check A1C today f/u prn   Orders: -     Comprehensive metabolic panel  Obesity, Class III, BMI 40-49.9 (morbid obesity) ([facility]) Assessment & Plan: chronic  pt weight dropped down to 242 last year after 1 month in hospital wt back up to 275 continue to advise on wt loss strategies checking labs today f/u prn     Subjective:    Outpatient Medications Prior to Visit  Medication Sig Dispense Refill   amLODipine (NORVASC) 10 MG tablet Take 1 tablet (10 mg total) by mouth daily. 30 tablet 3   apixaban (ELIQUIS) 5 MG TABS tablet TAKE 1 TABLET BY MOUTH TWICE A DAY 60 tablet 5   metoprolol succinate (TOPROL-XL) 25 MG 24 hr tablet START WITH 1/2 PILL FOR ONE WEEK, THEN INCREASE TO 1 FULL PILL AT BEDTIME. 30 tablet 2   telmisartan (MICARDIS) 40 MG tablet 40 mg daily.     Facility-Administered Medications Prior to Visit  Medication Dose Route Frequency  Provider Last Rate Last Admin   0.9 %  sodium chloride infusion  500 mL Intravenous Once Irene Shipper, MD       Past Medical History:  Diagnosis Date   Abscess    Acute blood loss anemia 123456   Acute metabolic encephalopathy 123456   Acute renal failure superimposed on stage 3a chronic kidney disease ([place]) 11/07/2021   Acute respiratory failure ([place]) 11/29/2021   Acute right-sided low back pain with bilateral sciatica 03/13/2017   Alteration in electrolyte and fluid balance 11/29/2021   Atrial fibrillation ([place])    June 2023   Cigarette nicotine dependence without complication [id number]   Coffee ground emesis 11/08/2021   Contusion of left hip and thigh 02/13/2017   Hypertension    Hyponatremia 11/07/2021   Intra-abdominal abscess ([place]) 11/07/2021   Osteoarthritis    Pressure injury of skin 11/28/2021   Protein in urine    Septic shock ([place]) 11/08/2021   Sleep apnea    Tobacco abuse 11/08/2021   Past Surgical History:  Procedure Laterality Date   ABSCESS DRAINAGE  02/18/2022   continuos drainage   COLONOSCOPY     CYSTOSCOPY WITH STENT PLACEMENT Bilateral 11/23/2021   Procedure: CYSTOSCOPY WITH STENT PLACEMENT;  Surgeon: Lucas Mallow, MD;  Location: [facility];  Service: Urology;  Laterality: Bilateral;   IR PATIENT EVAL TECH 0-60 MINS  02/18/2022   IR RADIOLOGIST EVAL & MGMT  12/23/2021   IR  RADIOLOGIST EVAL & MGMT  01/12/2022   IR RADIOLOGIST EVAL & MGMT  03/31/2022   LAPAROTOMY N/A 11/23/2021   Procedure: EXPLORATORY LAPAROTOMY, PARTIAL COLECTOMY AND COLOSTOMY;  Surgeon: Rolm Bookbinder, MD;  Location: [facility];  Service: General;  Laterality: N/A;   REPLACEMENT TOTAL HIP W/  RESURFACING IMPLANTS     Left    No Known Allergies    Objective:    Physical Exam Vitals and nursing note reviewed.  Constitutional:      General: He is not in acute distress.    Appearance: Normal appearance. He is obese.  HENT:     Head: Normocephalic.  Cardiovascular:      Rate and Rhythm: Normal rate and regular rhythm.  Pulmonary:     Effort: Pulmonary effort is normal.     Breath sounds: Normal breath sounds.  Musculoskeletal:        General: Normal range of motion.     Cervical back: Normal range of motion.  Skin:    General: Skin is warm and dry.  Neurological:     Mental Status: He is alert and oriented to person, place, and time.  Psychiatric:        Mood and Affect: Mood normal.    BP (!) 152/89 (BP Location: Left Arm, Patient Position: Sitting, Cuff Size: Large)   Pulse (!) 56   Temp 98 F (36.7 C) (Temporal)   Ht 5\' 7"  (1.702 m)   Wt 275 lb 3.2 oz (124.8 kg)   SpO2 97%   BMI 43.10 kg/m  Wt Readings from Last 3 Encounters:  08/24/22 275 lb 3.2 oz (124.8 kg)  08/23/22 270 lb (122.5 kg)  06/27/22 272 lb (123.4 kg)       Jeanie Sewer, NP

## 2022-08-24 NOTE — Assessment & Plan Note (Signed)
had been in remission since bowel obstr last year pt has regained all his weight not checking CBGs at home will check A1C today f/u prn

## 2022-08-24 NOTE — Assessment & Plan Note (Signed)
Chronic taking Amlodipine 10mg  and Metoprolol 25mg  ER, Telmisartan 40mg  BP elevated today still followed by nephrology, sees every 85mos, states seen 2w ago w/labs, but no records in chart checking labs today f/u 6 mos - best to alternate with nephrology

## 2022-08-25 ENCOUNTER — Encounter (HOSPITAL_COMMUNITY): Payer: Self-pay | Admitting: Internal Medicine

## 2022-08-25 NOTE — Progress Notes (Signed)
Please call Dr. Geanie Berlin office - [facility] and remind them to send [place] office records please!

## 2022-08-31 NOTE — Anesthesia Preprocedure Evaluation (Signed)
Anesthesia Evaluation  Patient identified by MRN, date of birth, ID band Patient awake    Reviewed: Allergy & Precautions, NPO status , Patient's Chart, lab work & pertinent test results  Airway Mallampati: III  TM Distance: >3 FB     Dental no notable dental hx.    Pulmonary sleep apnea , former smoker   Pulmonary exam normal        Cardiovascular hypertension, Pt. on medications and Pt. on home beta blockers + dysrhythmias Atrial Fibrillation  Rhythm:Regular Rate:Normal     Neuro/Psych negative neurological ROS  negative psych ROS   GI/Hepatic Neg liver ROS,GERD  ,,  Endo/Other  diabetes  Morbid obesity  Renal/GU Renal disease  negative genitourinary   Musculoskeletal  (+) Arthritis , Osteoarthritis,    Abdominal Normal abdominal exam  (+)   Peds  Hematology  (+) Blood dyscrasia, anemia   Anesthesia Other Findings   Reproductive/Obstetrics                             Anesthesia Physical Anesthesia Plan  ASA: 3  Anesthesia Plan: MAC   Post-op Pain Management:    Induction: Intravenous  PONV Risk Score and Plan: Propofol infusion and Treatment may vary due to age or medical condition  Airway Management Planned: Simple Face Mask and Nasal Cannula  Additional Equipment: None  Intra-op Plan:   Post-operative Plan:   Informed Consent: I have reviewed the patients History and Physical, chart, labs and discussed the procedure including the risks, benefits and alternatives for the proposed anesthesia with the patient or authorized representative who has indicated his/her understanding and acceptance.     Dental advisory given  Plan Discussed with: CRNA  Anesthesia Plan Comments:        Anesthesia Quick Evaluation

## 2022-09-01 ENCOUNTER — Encounter (HOSPITAL_COMMUNITY): Payer: Self-pay | Admitting: Internal Medicine

## 2022-09-01 ENCOUNTER — Ambulatory Visit (HOSPITAL_COMMUNITY): Payer: Medicare HMO | Admitting: Certified Registered Nurse Anesthetist

## 2022-09-01 ENCOUNTER — Encounter (HOSPITAL_COMMUNITY)
Admission: RE | Disposition: A | Discharge status: Home / Self Care | Payer: Self-pay | Source: Ambulatory Visit | Attending: Internal Medicine

## 2022-09-01 ENCOUNTER — Other Ambulatory Visit: Payer: Self-pay

## 2022-09-01 ENCOUNTER — Ambulatory Visit (HOSPITAL_BASED_OUTPATIENT_CLINIC_OR_DEPARTMENT_OTHER): Payer: Medicare HMO | Admitting: Certified Registered Nurse Anesthetist

## 2022-09-01 ENCOUNTER — Ambulatory Visit (HOSPITAL_COMMUNITY)
Admission: RE | Admit: 2022-09-01 | Discharge: 2022-09-01 | Disposition: A | Discharge status: Home / Self Care | Payer: Medicare HMO | Source: Ambulatory Visit | Attending: Internal Medicine | Admitting: Internal Medicine

## 2022-09-01 DIAGNOSIS — D122 Benign neoplasm of ascending colon: Secondary | ICD-10-CM

## 2022-09-01 DIAGNOSIS — Z860100 Personal history of colon polyps, unspecified: Secondary | ICD-10-CM

## 2022-09-01 DIAGNOSIS — G4730 Sleep apnea, unspecified: Secondary | ICD-10-CM | POA: Insufficient documentation

## 2022-09-01 DIAGNOSIS — Z87891 Personal history of nicotine dependence: Secondary | ICD-10-CM | POA: Diagnosis not present

## 2022-09-01 DIAGNOSIS — K5720 Diverticulitis of large intestine with perforation and abscess without bleeding: Secondary | ICD-10-CM | POA: Insufficient documentation

## 2022-09-01 DIAGNOSIS — Z7901 Long term (current) use of anticoagulants: Secondary | ICD-10-CM | POA: Insufficient documentation

## 2022-09-01 DIAGNOSIS — K5730 Diverticulosis of large intestine without perforation or abscess without bleeding: Secondary | ICD-10-CM | POA: Diagnosis not present

## 2022-09-01 DIAGNOSIS — D125 Benign neoplasm of sigmoid colon: Secondary | ICD-10-CM

## 2022-09-01 DIAGNOSIS — E1122 Type 2 diabetes mellitus with diabetic chronic kidney disease: Secondary | ICD-10-CM | POA: Insufficient documentation

## 2022-09-01 DIAGNOSIS — Z1211 Encounter for screening for malignant neoplasm of colon: Secondary | ICD-10-CM | POA: Insufficient documentation

## 2022-09-01 DIAGNOSIS — D123 Benign neoplasm of transverse colon: Secondary | ICD-10-CM

## 2022-09-01 DIAGNOSIS — Z6841 Body Mass Index (BMI) 40.0 and over, adult: Secondary | ICD-10-CM | POA: Insufficient documentation

## 2022-09-01 DIAGNOSIS — I129 Hypertensive chronic kidney disease with stage 1 through stage 4 chronic kidney disease, or unspecified chronic kidney disease: Secondary | ICD-10-CM | POA: Diagnosis not present

## 2022-09-01 DIAGNOSIS — N1831 Chronic kidney disease, stage 3a: Secondary | ICD-10-CM | POA: Insufficient documentation

## 2022-09-01 DIAGNOSIS — M1990 Unspecified osteoarthritis, unspecified site: Secondary | ICD-10-CM | POA: Insufficient documentation

## 2022-09-01 DIAGNOSIS — I4891 Unspecified atrial fibrillation: Secondary | ICD-10-CM | POA: Diagnosis not present

## 2022-09-01 DIAGNOSIS — I482 Chronic atrial fibrillation, unspecified: Secondary | ICD-10-CM | POA: Diagnosis not present

## 2022-09-01 DIAGNOSIS — I10 Essential (primary) hypertension: Secondary | ICD-10-CM | POA: Diagnosis not present

## 2022-09-01 DIAGNOSIS — K5790 Diverticulosis of intestine, part unspecified, without perforation or abscess without bleeding: Secondary | ICD-10-CM | POA: Diagnosis not present

## 2022-09-01 DIAGNOSIS — K635 Polyp of colon: Secondary | ICD-10-CM | POA: Diagnosis not present

## 2022-09-01 HISTORY — PX: COLONOSCOPY WITH PROPOFOL: SHX5780

## 2022-09-01 HISTORY — PX: POLYPECTOMY: SHX5525

## 2022-09-01 SURGERY — COLONOSCOPY WITH PROPOFOL
Anesthesia: Monitor Anesthesia Care

## 2022-09-01 MED ORDER — PROPOFOL 10 MG/ML IV BOLUS
INTRAVENOUS | Status: AC
  Filled 2022-09-01: qty 20

## 2022-09-01 MED ORDER — LACTATED RINGERS IV SOLN: INTRAVENOUS | Status: DC

## 2022-09-01 MED ORDER — PROPOFOL 10 MG/ML IV BOLUS
INTRAVENOUS | Status: DC | PRN
  Administered 2022-09-01: 20 mg via INTRAVENOUS

## 2022-09-01 MED ORDER — LIDOCAINE 2% (20 MG/ML) 5 ML SYRINGE
INTRAMUSCULAR | Status: DC | PRN
  Administered 2022-09-01: 100 mg via INTRAVENOUS

## 2022-09-01 MED ORDER — PROPOFOL 500 MG/50ML IV EMUL
INTRAVENOUS | Status: DC | PRN
  Administered 2022-09-01: 90 ug/kg/min via INTRAVENOUS

## 2022-09-01 MED ORDER — SODIUM CHLORIDE 0.9 % IV SOLN: INTRAVENOUS | Status: DC

## 2022-09-01 SURGICAL SUPPLY — 22 items

## 2022-09-01 NOTE — Op Note (Addendum)
[facility] Patient Name: Robert Larson Procedure Date: 09/01/2022 MRN: [id number] Attending MD: Docia Chuck. Henrene Pastor , MD, [id number] Date of Birth: 08/05/1955 CSN: [id number] Age: 67 Admit Type: Outpatient Procedure:                Colonoscopy with cold snare polypectomy x 3; biopsy                            polypectomy x 1 Indications:              High risk colon cancer surveillance: Personal                            history of adenoma (10 mm or greater in size), High                            risk colon cancer surveillance: Personal history of                            multiple (3 or more) adenomas. Previous                            examinations 2012, 2019. June 2023 with extensive                            hospitalization due to extensive pelvic abscess                            requiring drainage. Subsequent MR enterography did                            not reveal small bowel disease or fistula. Providers:                Docia Chuck. Henrene Pastor, MD, Carlyn Reichert, RN, Janee Morn, Technician, Christell Faith, CRNA Referring MD:             Rolm Bookbinder MD Medicines:                Monitored Anesthesia Care Complications:            No immediate complications. Estimated blood loss:                            None. Estimated Blood Loss:     Estimated blood loss: none. Procedure:                Pre-Anesthesia Assessment:                           - Prior to the procedure, a History and Physical                            was performed, and patient medications and  allergies were reviewed. The patient's tolerance of                            previous anesthesia was also reviewed. The risks                            and benefits of the procedure and the sedation                            options and risks were discussed with the patient.                            All questions were answered, and informed  consent                            was obtained. Prior Anticoagulants: The patient has                            taken Eliquis (apixaban), last dose was 3 days                            prior to procedure. ASA Grade Assessment: III - A                            patient with severe systemic disease. After                            reviewing the risks and benefits, the patient was                            deemed in satisfactory condition to undergo the                            procedure.                           After obtaining informed consent, the colonoscope                            was passed under direct vision. Throughout the                            procedure, the patient's blood pressure, pulse, and                            oxygen saturations were monitored continuously. The                            [id number]) Olympus colonoscope was                            introduced through the anus and advanced to the the  cecum, identified by appendiceal orifice and                            ileocecal valve. The ileocecal valve, appendiceal                            orifice, and rectum were photographed. The quality                            of the bowel preparation was excellent. The                            colonoscopy was performed without difficulty. The                            patient tolerated the procedure well. The bowel                            preparation used was SUPREP via split dose                            instruction. Scope In: 9:25:04 AM Scope Out: 9:48:28 AM Scope Withdrawal Time: 0 hours 16 minutes 29 seconds  Total Procedure Duration: 0 hours 23 minutes 24 seconds  Findings:      Three polyps were found in the sigmoid colon and ascending colon. The       polyps were 3 to 4 mm in size. These polyps were removed with a cold       snare. Resection and retrieval were complete.      A less than 1 mm polyp was  found in the transverse colon. The polyp was       removed with a jumbo cold forceps. Resection and retrieval were complete.      Multiple diverticula were found in the sigmoid colon. There was a long       segment of fixed narrowing in the sigmoid colon. Prominent rectal veins.      The exam was otherwise without abnormality on direct and retroflexion       views. Impression:               - Three 3 to 4 mm polyps in the sigmoid colon and                            in the ascending colon, removed with a cold snare.                            Resected and retrieved.                           - One less than 1 mm polyp in the transverse colon,                            removed with a jumbo cold forceps. Resected and  retrieved.                           - Diverticulosis in the sigmoid colon. There was a                            segment of fixed narrowing in the sigmoid colon.                            Prominent rectal veins.                           - The examination was otherwise normal on direct                            and retroflexion views.                           - Problems with pelvic abscess presumably due to                            complicated sigmoid diverticulitis. No evidence for                            small bowel disease or enterography. No evidence                            for fistulous disease Moderate Sedation:      none Recommendation:           - Repeat colonoscopy in 5 years for surveillance.                           - Patient has a contact number available for                            emergencies. The signs and symptoms of potential                            delayed complications were discussed with the                            patient. Return to normal activities tomorrow.                            Written discharge instructions were provided to the                            patient.                           -  Resume previous diet.                           - Continue present medications.                           -  RESUME Eliquis at previous dose tomorrow                           - Please return to the care of Dr. Donne Hazel for                            follow-up                           - Await pathology results. Procedure Code(s):        --- Professional ---                           [phone], Colonoscopy, flexible; with removal of                            tumor(s), polyp(s), or other lesion(s) by snare                            technique                           45380, 105, Colonoscopy, flexible; with biopsy,                            single or multiple Diagnosis Code(s):        --- Professional ---                           D12.5, Benign neoplasm of sigmoid colon                           D12.2, Benign neoplasm of ascending colon                           D12.3, Benign neoplasm of transverse colon (hepatic                            flexure or splenic flexure)                           Z86.010, Personal history of colonic polyps                           K57.30, Diverticulosis of large intestine without                            perforation or abscess without bleeding CPT copyright 2022 American [employer]. All rights reserved. The codes documented in this report are preliminary and upon coder review may  be revised to meet current compliance requirements. Docia Chuck. Henrene Pastor, MD 09/01/2022 10:05:11 AM This report has been signed electronically. Number of Addenda: 0

## 2022-09-01 NOTE — Transfer of Care (Signed)
Immediate Anesthesia Transfer of Care Note  Patient: Robert Larson  Procedure(s) Performed: COLONOSCOPY WITH PROPOFOL POLYPECTOMY  Patient Location: PACU and Endoscopy Unit  Anesthesia Type:MAC  Level of Consciousness: awake, alert , and patient cooperative  Airway & Oxygen Therapy: Patient Spontanous Breathing and Patient connected to face mask oxygen  Post-op Assessment: Report given to RN and Post -op Vital signs reviewed and stable  Post vital signs: Reviewed and stable  Last Vitals:  Vitals Value Taken Time  BP    Temp    Pulse 74 09/01/22 0954  Resp 23 09/01/22 0954  SpO2 98 % 09/01/22 0954  Vitals shown include unvalidated device data.  Last Pain:  Vitals:   09/01/22 0759  TempSrc: Temporal  PainSc:          Complications: No notable events documented.

## 2022-09-01 NOTE — Anesthesia Postprocedure Evaluation (Signed)
Anesthesia Post Note  Patient: Robert Larson  Procedure(s) Performed: COLONOSCOPY WITH PROPOFOL POLYPECTOMY     Patient location during evaluation: PACU Anesthesia Type: MAC Level of consciousness: awake and alert Pain management: pain level controlled Vital Signs Assessment: post-procedure vital signs reviewed and stable Respiratory status: spontaneous breathing, nonlabored ventilation, respiratory function stable and patient connected to nasal cannula oxygen Cardiovascular status: stable and blood pressure returned to baseline Postop Assessment: no apparent nausea or vomiting Anesthetic complications: no   No notable events documented.  Last Vitals:  Vitals:   09/01/22 1000 09/01/22 1010  BP: 131/78 136/81  Pulse: 73 62  Resp: (!) 21 19  Temp:    SpO2: 94% 94%    Last Pain:  Vitals:   09/01/22 1010  TempSrc:   PainSc: 0-No pain                 Belenda Cruise P [place]

## 2022-09-01 NOTE — H&P (Signed)
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  Patient sent today, by his general surgeon Dr. Donne Hazel, regarding recent problems with intra-abdominal abscess with sepsis for which she was hospitalized for 1 month (November 07, 2021 through December 07, 2021).   I initially saw the patient in October 2012 for colonoscopy.  At that time he was found to have multiple and advanced adenomatous polyps.  He was lost to follow-up and represented September 2019 with rectal bleeding.  He underwent colonoscopy March 27, 2018.  He was found to have 17 adenomatous polyps between 2 and 12 mm which were removed.  Follow-up in 1 year recommended.  He was also noted to have bleeding internal hemorrhoids to account for his rectal bleeding.  Finally, multiple sigmoid diverticula.   The following is a summary from the infectious disease clinic regarding the patient's course during his hospitalization and thereafter until April 25, 2022: "-Initially managed with IR drain placement 6/5 and 6/15 with cultures growing E. coli, anaerobes, and Candida tropicalis (R to fluc) -Status post ex lap and drainage of pelvic abscess 6/20 (no new cultures).  Found to have a good portion of his abdomen frozen at that time so unable to safely undergo resection of the sigmoid colon or diversion -Repeat CT scan 6/24 given clinical instability showed a interval development of large loculated fluid collection status post IR drainage.  Cultures from this growing E. coli, Bacteroides, and Candida albicans. -Discharged 7/4 on ertapenem and caspafungin -Seen by me 7/18 -- doing well.  Abx extended pending IR and surgery follow up. 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  Drain left in place and plan for repeat catheter injection on 01/12/22.  CT scan showed:  1.) interval resolution of Left Lower Quadrant collection s/p drain placement.   2.) 3.1cm gas/fluid collection at the inferior margin of the midline anterior body wall healing incision 3.) 2.7 cm right hepatic lesion stable since 11/22/21.  Radiology recommended to consider outpatient MR with contrast to differentiate abscess from neoplasm. -Surgery follow up 7/21: planning to manage conservatively for now.  Follow up again in 4 weeks -OPAT labs 7/18: WBC 7.4, Hgb 11.0, creatinine 1.27, GFR 63, ESR 87, CRP 10. -7/25 phone visit: has now been on current IV regimen of ertapenem and caspafungin since last procedure/drain placement 11/27/21.  Continues to overall do well and feel well.  -7/28 MRI liver -- 3.1 cm subscapular right hepatic lobe lesion.  Diff dx includes partially treated abscess, hemangioma.  Less likely [place]. -8/9 IR follow up: persistent fistula appears to be decreasing.  -8/11 follow up with surgery.  Doing well and abdominal wound noted to be nearly healed. No plans for surgery. - 01/18/22 - continue to feel better and better.  No fevers, chills.  Drain remains in place.  Antibiotics were discontinued.  -03/31/22 - IR follow up.  Abscesses resolved on imaging, no output from abdominal drain and thus it was removed.  - 04/19/22 - surgery follow up.  He is doing very well.  Mostly back to normal.      I was contacted by his general surgeon, Dr. Donne Hazel, November.  Etiology of his intra-abdominal abscess was uncertain.  There were some concerns that he possibly had  an some sort of small bowel fistula.  I recommended MR enterography, ongoing convalescence, and follow-up at this time.  Looks like the MR enterography order was entered but the procedure not scheduled.  The patient presents today with his daughter.  He continues to make slow progress.  No abdominal pain.  No longer needing  antibiotics.  He did have transient loose bowel movements which have resolved.  He stopped smoking.   He did have a follow-up MRI of the liver May 22, 2022.  He was found to have changes compatible with cirrhosis and hepatic steatosis.  Previous liver lesion felt to be a benign hemangioma.  Other incidental findings as noted.   REVIEW OF SYSTEMS:   All non-GI ROS negative unless otherwise stated in the HPI except for fatigue, cough       Past Medical History:  Diagnosis Date   Abscess     Acute blood loss anemia 0000000   Acute metabolic encephalopathy 0000000   Acute renal failure superimposed on stage 3a chronic kidney disease ([place]) 11/07/2021   Acute respiratory failure ([place]) 11/29/2021   Acute right-sided low back pain with bilateral sciatica 03/13/2017   Alteration in electrolyte and fluid balance 11/29/2021   Coffee ground emesis 11/08/2021   Contusion of left hip and thigh 02/13/2017   Hyponatremia 11/07/2021   Osteoarthritis     Pressure injury of skin 11/28/2021   Protein in urine     Septic shock ([place]) 11/08/2021   Sleep apnea     Tobacco abuse 11/08/2021           Past Surgical History:  Procedure Laterality Date   ABSCESS DRAINAGE   02/18/2022   CYSTOSCOPY WITH STENT PLACEMENT Bilateral 11/23/2021    Procedure: CYSTOSCOPY WITH STENT PLACEMENT;  Surgeon: Lucas Mallow, MD;  Location: [facility];  Service: Urology;  Laterality: Bilateral;   IR PATIENT EVAL TECH 0-60 MINS   02/18/2022   IR RADIOLOGIST EVAL & MGMT   12/23/2021   IR RADIOLOGIST EVAL & MGMT   01/12/2022   IR RADIOLOGIST EVAL & MGMT   03/31/2022   LAPAROTOMY N/A 11/23/2021    Procedure: EXPLORATORY LAPAROTOMY, PARTIAL COLECTOMY AND COLOSTOMY;  Surgeon: Rolm Bookbinder, MD;  Location: [facility];  Service: General;  Laterality: N/A;   REPLACEMENT TOTAL HIP W/  RESURFACING IMPLANTS        Left       Social History CORD STRAUGHAN  reports that he has quit smoking. His smoking use included cigarettes. He smoked an  average of 1 pack per day. He has never used smokeless tobacco. He reports that he does not drink alcohol and does not use drugs.   family history includes Breast cancer in his mother; Lung cancer in his father.   No Known Allergies       PHYSICAL EXAMINATION: Vital signs: BP (!) 140/80 (BP Location: Left Arm, Patient Position: Sitting, Cuff Size: Large)   Pulse 78   Ht 5\' 7"  (1.702 m)   Wt 272 lb (123.4 kg)   SpO2 96%   BMI 42.60 kg/m   Constitutional: Obese, unhealthy appearing, no acute distress Psychiatric: alert and oriented x3, cooperative Eyes: extraocular movements intact, anicteric, conjunctiva pink Mouth: oral pharynx moist, no lesions Neck: supple no lymphadenopathy Cardiovascular: heart regular rate and rhythm, no murmur Lungs: clear to auscultation bilaterally Abdomen: soft, obese, nontender, nondistended, no obvious ascites, no peritoneal signs, normal bowel sounds, no organomegaly.  Large midline incision healed Rectal: Deferred until colonoscopy  Extremities: no clubbing, cyanosis.  1-2+ lower extremity edema bilaterally Skin: no relevant lesions on visible extremities Neuro: No focal deficits. No asterixis.   ASSESSMENT:   1.  Intra-abdominal abscess with sepsis.  Uncertain, but most likely diverticular in origin.  Treated with IR drainage and antibiotics to resolution.  Surgeon had question regarding small bowel disease/fistulous disease. 2.  History of multiple advanced adenomatous colon polyps.  Overdue for follow-up 3.  Multiple comorbidities including morbid obesity and chronic atrial fibrillation on Eliquis     PLAN:   1.  Schedule MR enterography to rule out any significant small bowel pathology 2.  Thereafter, likely need colonoscopy.  The patient is high risk.  This will be performed at the hospital.The nature of the procedure, as well as the risks, benefits, and alternatives were carefully and thoroughly reviewed with the patient. Ample time for  discussion and questions allowed. The patient understood, was satisfied, and agreed to proceed. 3.  Will need cardiology regarding temporary cessation of his Eliquis as he is known to have multiple polyps and would be at significant risk of bleeding with multiple polypectomies on Eliquis.  I will reach out to Dr. Harl Bowie. 4.  Ongoing general medical care with PCP and other specialists    Recent H&P as outlined above.  No interval clinical change.  MR enterography did not show small bowel disease or fistula.  Not for colonoscopy.

## 2022-09-01 NOTE — Anesthesia Procedure Notes (Signed)
Procedure Name: MAC Date/Time: 09/01/2022 9:11 AM  Performed by: West Pugh, CRNAPre-anesthesia Checklist: Patient identified, Emergency Drugs available, Suction available, Patient being monitored and Timeout performed Patient Re-evaluated:Patient Re-evaluated prior to induction Oxygen Delivery Method: Simple face mask Preoxygenation: Pre-oxygenation with 100% oxygen Placement Confirmation: positive ETCO2 Dental Injury: Teeth and Oropharynx as per pre-operative assessment

## 2022-09-01 NOTE — Discharge Instructions (Addendum)
Restart your Eliquis tomorrow (09/02/2022).  YOU HAD AN ENDOSCOPIC PROCEDURE TODAY: Refer to the procedure report and other information in the discharge instructions given to you for any specific questions about what was found during the examination. If this information does not answer your questions, please call [place] office at [phone] to clarify.   YOU SHOULD EXPECT: Some feelings of bloating in the abdomen. Passage of more gas than usual. Walking can help get rid of the air that was put into your GI tract during the procedure and reduce the bloating. If you had a lower endoscopy (such as a colonoscopy or flexible sigmoidoscopy) you may notice spotting of blood in your stool or on the toilet paper. Some abdominal soreness may be present for a day or two, also.  DIET: Your first meal following the procedure should be a light meal and then it is ok to progress to your normal diet. A half-sandwich or bowl of soup is an example of a good first meal. Heavy or fried foods are harder to digest and may make you feel nauseous or bloated. Drink plenty of fluids but you should avoid alcoholic beverages for 24 hours.   ACTIVITY: Your care partner should take you home directly after the procedure. You should plan to take it easy, moving slowly for the rest of the day. You can resume normal activity the day after the procedure however YOU SHOULD NOT DRIVE, use power tools, machinery or perform tasks that involve climbing or major physical exertion for 24 hours (because of the sedation medicines used during the test).   SYMPTOMS TO REPORT IMMEDIATELY: A gastroenterologist can be reached at any hour. Please call [phone]  for any of the following symptoms:  Following lower endoscopy (colonoscopy, flexible sigmoidoscopy) Excessive amounts of blood in the stool  Significant tenderness, worsening of abdominal pains  Swelling of the abdomen that is new, acute  Fever of 100 or higher    FOLLOW UP:  If  any biopsies were taken you will be contacted by phone or by letter within the next 1-3 weeks. Call [phone]  if you have not heard about the biopsies in 3 weeks.  Please also call with any specific questions about appointments or follow up tests.

## 2022-09-02 LAB — SURGICAL PATHOLOGY

## 2022-09-05 ENCOUNTER — Encounter (HOSPITAL_COMMUNITY): Payer: Self-pay | Admitting: Internal Medicine

## 2022-09-18 DIAGNOSIS — G4733 Obstructive sleep apnea (adult) (pediatric): Secondary | ICD-10-CM | POA: Diagnosis not present

## 2022-10-14 ENCOUNTER — Ambulatory Visit: Payer: Medicare HMO | Attending: Internal Medicine | Admitting: Internal Medicine

## 2022-10-14 ENCOUNTER — Encounter: Payer: Self-pay | Admitting: Internal Medicine

## 2022-10-14 VITALS — BP 148/62 | HR 70 | Ht 67.0 in | Wt 286.6 lb

## 2022-10-14 DIAGNOSIS — I7121 Aneurysm of the ascending aorta, without rupture: Secondary | ICD-10-CM | POA: Diagnosis not present

## 2022-10-14 NOTE — Patient Instructions (Signed)
Medication Instructions:  No changes *If you need a refill on your cardiac medications before your next appointment, please call your pharmacy*  Testing/Procedures: Your physician has requested that you have a limited echocardiogram in June. Echocardiography is a painless test that uses sound waves to create images of your heart. It provides your doctor with information about the size and shape of your heart and how well your heart's chambers and valves are working. This procedure takes approximately one hour. There are no restrictions for this procedure. Please do NOT wear cologne, perfume, aftershave, or lotions (deodorant is allowed). Please arrive 15 minutes prior to your appointment time.    Follow-Up: At [facility], you and your health needs are our priority.  As part of our continuing mission to provide you with exceptional heart care, we have created designated Provider Care Teams.  These Care Teams include your primary Cardiologist (physician) and Advanced Practice Providers (APPs -  Physician Assistants and Nurse Practitioners) who all work together to provide you with the care you need, when you need it.  We recommend signing up for the patient portal called "MyChart".  Sign up information is provided on this After Visit Summary.  MyChart is used to connect with patients for Virtual Visits (Telemedicine).  Patients are able to view lab/test results, encounter notes, upcoming appointments, etc.  Non-urgent messages can be sent to your provider as well.   To learn more about what you can do with MyChart, go to [email or web address].    Your next appointment:   6 month(s)  Provider:   Maisie Fus, MD

## 2022-10-14 NOTE — Progress Notes (Signed)
Cardiology Office Note:    Date:  10/14/2022   ID:  Robert Larson, DOB [birth year], MRN [id number]  PCP:  Dulce Sellar, NP   [facility] Providers Cardiologist:  Maisie Fus, MD     Referring MD: Dulce Sellar, NP   No chief complaint on file. Hospital FU  History of Present Illness:    Robert Larson is a 67 y.o. male with a hx of sleep apnea on CAP, T2DM, HTN, paroxysmal atrial fibrillation chads2vasc=2,  normal LV function and normal LA size.  He was seen in the hospital in June with new onset afib. This was in the setting of diverticulitis c/b bowel perforation with multiple  intra-abdominal abscess s/p  ex-lap, bilateral open ended ureteral catheter placement, wound vac placed IR drain. He was septic growing E. Coli. With drain in place, was hesitant about AC. He was on farxiga with Dm2 prior, this was stopped as well as metformin. He was FTT/malnourished and TPN. He was dc'd on IV caspofungin and ertapenem. No AC. Has macrocytic anemia.   Today, he denies bleeding.  No palpitations. He denies SOB or CP. No syncope. He has his drain in place.   Interim hx 10/14/2022 He has gained weight. Restarted telmisartan in March. Bp well controlled at home. No palpitations. No orthopnea or PND. Has some dependant edema.  Past Medical History:  Diagnosis Date   Abscess    Acute blood loss anemia 12/04/2021   Acute metabolic encephalopathy 12/04/2021   Acute renal failure superimposed on stage 3a chronic kidney disease ([facility]) 11/07/2021   Acute respiratory failure ([facility]) 11/29/2021   Acute right-sided low back pain with bilateral sciatica 03/13/2017   Alteration in electrolyte and fluid balance 11/29/2021   Atrial fibrillation ([facility])    June 2023   Cigarette nicotine dependence without complication 07/19/2021   Coffee ground emesis 11/08/2021   Contusion of left hip and thigh 02/13/2017   Hypertension    Hyponatremia 11/07/2021   Intra-abdominal abscess ([facility])  11/07/2021   Osteoarthritis    Pressure injury of skin 11/28/2021   Protein in urine    Septic shock ([facility]) 11/08/2021   Sleep apnea    Tobacco abuse 11/08/2021    Past Surgical History:  Procedure Laterality Date   ABSCESS DRAINAGE  02/18/2022   continuos drainage   COLONOSCOPY     COLONOSCOPY WITH PROPOFOL N/A 09/01/2022   Procedure: COLONOSCOPY WITH PROPOFOL;  Surgeon: Hilarie Fredrickson, MD;  Location: [facility];  Service: Gastroenterology;  Laterality: N/A;   CYSTOSCOPY WITH STENT PLACEMENT Bilateral 11/23/2021   Procedure: CYSTOSCOPY WITH STENT PLACEMENT;  Surgeon: Crista Elliot, MD;  Location[facility];  Service: Urology;  Laterality: Bilateral;   IR PATIENT EVAL TECH 0-60 MINS  02/18/2022   IR RADIOLOGIST EVAL & MGMT  12/23/2021   IR RADIOLOGIST EVAL & MGMT  01/12/2022   IR RADIOLOGIST EVAL & MGMT  03/31/2022   LAPAROTOMY N/A 11/23/2021   Procedure: EXPLORATORY LAPAROTOMY, PARTIAL COLECTOMY AND COLOSTOMY;  Surgeon: Emelia Loron, MD;  Location: [facility];  Service: General;  Laterality: N/A;   POLYPECTOMY  09/01/2022   Procedure: POLYPECTOMY;  Surgeon: Hilarie Fredrickson, MD;  Location: [facility];  Service: Gastroenterology;;   REPLACEMENT TOTAL HIP W/  RESURFACING IMPLANTS     Left     Current Medications: Current Meds  Medication Sig   amLODipine (NORVASC) 10 MG tablet Take 1 tablet (10 mg total) by mouth daily.   apixaban (ELIQUIS) 5 MG TABS  tablet TAKE 1 TABLET BY MOUTH TWICE A DAY   metoprolol succinate (TOPROL-XL) 25 MG 24 hr tablet START WITH 1/2 PILL FOR ONE WEEK, THEN INCREASE TO 1 FULL PILL AT BEDTIME. (Patient taking differently: Take 25 mg by mouth at bedtime.)   telmisartan (MICARDIS) 40 MG tablet Take 40 mg by mouth in the morning.   Current Facility-Administered Medications for the 10/14/22 encounter (Office Visit) with Maisie Fus, MD  Medication   0.9 %  sodium chloride infusion     Allergies:   Patient has no known allergies.   Social History    Socioeconomic History   Marital status: Married    Spouse name: Not on file   Number of children: 1   Years of education: Not on file   Highest education level: Not on file  Occupational History   Occupation: Engineer, water  Tobacco Use   Smoking status: Former    Packs/day: 1    Types: Cigarettes   Smokeless tobacco: Never  Vaping Use   Vaping Use: Never used  Substance and Sexual Activity   Alcohol use: No   Drug use: Never   Sexual activity: Not on file  Other Topics Concern   Not on file  Social History Narrative   Not on file   Social Determinants of Health   Financial Resource Strain: Not on file  Food Insecurity: Not on file  Transportation Needs: Not on file  Physical Activity: Not on file  Stress: Not on file  Social Connections: Not on file     Family History: The patient's family history includes Breast cancer in his mother; Lung cancer in his father. There is no history of Colon cancer or Rectal cancer.  ROS:   Please see the history of present illness.     All other systems reviewed and are negative.  EKGs/Labs/Other Studies Reviewed:    The following studies were reviewed today:   EKG:  EKG is  ordered today.  The ekg ordered today demonstrates   03/16/2022-NSR  10/14/2022- NSR, IRBBB  12/12/2021 30 day- cardiac monitor , minimal episodes of afib in the early AM. Minimal ectopy.  TTE 11/09/2021- normal LV fxn, no significant valve dx   Recent Labs: 11/12/2021: TSH 3.480 11/14/2021: B Natriuretic Peptide 158.3 12/03/2021: Hemoglobin 8.6; Platelets 239 12/06/2021: Magnesium 1.8 08/24/2022: ALT 12; BUN 11; Creatinine, Ser 1.46; Potassium 4.3; Sodium 137  Recent Lipid Panel    Component Value Date/Time   CHOL 189 08/24/2022 0900   TRIG 293.0 (H) 08/24/2022 0900   HDL 38.40 (L) 08/24/2022 0900   CHOLHDL 5 08/24/2022 0900   VLDL 58.6 (H) 08/24/2022 0900   LDLCALC 41 06/24/2021 0000   LDLDIRECT 104.0 08/24/2022 0900     Risk  Assessment/Calculations:     Physical Exam:    VS:   Vitals:   10/14/22 1338  BP: (!) 148/62  Pulse: 70  SpO2: 96%      Wt Readings from Last 3 Encounters:  10/14/22 286 lb 9.6 oz (130 kg)  09/01/22 270 lb (122.5 kg)  08/24/22 275 lb 3.2 oz (124.8 kg)     GEN:  Well nourished, well developed in no acute distress HEENT: Normal NECK: No JVD; No carotid bruits CARDIAC: RRR, no murmurs, rubs, gallops RESPIRATORY:  Clear to auscultation without rales, wheezing or rhonchi  ABDOMEN: Soft, non-tender, non-distended MUSCULOSKELETAL:  No edema; No deformity  SKIN: Warm and dry NEUROLOGIC:  Alert and oriented x 3 PSYCHIATRIC:  Normal affect  ASSESSMENT:    Paroxysmal Atrial Fibrillation: had recurrent afib. Chads2vasc = 2. Recommended AC. He has no bleeding. Can continue. Continue metoprolol XL 25 mg daily. Will continue with rate control for now. In sinus rhythm.   HTN:  well controlled at home. Cont norvasc 10 mg daily, telmisartan 40 mg daily and norvasc 10 mg daily.  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 Will repeat limited TTE to re-assess 11/2022, 1 year after his prior echo  DM2: A1c 5.9%  Dependent edema: compression stockings. Discussed weight loss.  PLAN:    In order of problems listed above:   Limited TTE for ascending aortic aneurysm Follow-up 6 months     Medication Adjustments/Labs and Tests Ordered: Current medicines are reviewed at length with the patient today.  Concerns regarding medicines are outlined above.  No orders of the defined types were placed in this encounter.  No orders of the defined types were placed in this encounter.   There are no Patient Instructions on file for this visit.   Signed, Maisie Fus, MD  10/14/2022 1:40 PM    [facility]

## 2022-10-18 DIAGNOSIS — G4733 Obstructive sleep apnea (adult) (pediatric): Secondary | ICD-10-CM | POA: Diagnosis not present

## 2022-11-02 NOTE — Addendum Note (Signed)
Addended by: Jethro Bolus A on: 11/02/2022 03:14 PM   Modules accepted: Orders

## 2022-11-11 ENCOUNTER — Other Ambulatory Visit: Payer: Self-pay | Admitting: Family

## 2022-11-11 DIAGNOSIS — I10 Essential (primary) hypertension: Secondary | ICD-10-CM

## 2022-11-17 ENCOUNTER — Ambulatory Visit (HOSPITAL_COMMUNITY): Payer: Medicare HMO | Attending: Cardiology

## 2022-11-17 DIAGNOSIS — I7121 Aneurysm of the ascending aorta, without rupture: Secondary | ICD-10-CM | POA: Diagnosis not present

## 2022-11-17 LAB — ECHOCARDIOGRAM LIMITED
Area-P 1/2: 2.39 cm2
S' Lateral: 3 cm

## 2022-11-18 DIAGNOSIS — G4733 Obstructive sleep apnea (adult) (pediatric): Secondary | ICD-10-CM | POA: Diagnosis not present

## 2022-12-08 ENCOUNTER — Other Ambulatory Visit: Payer: Self-pay | Admitting: Family

## 2022-12-08 DIAGNOSIS — I10 Essential (primary) hypertension: Secondary | ICD-10-CM

## 2022-12-14 DIAGNOSIS — G4733 Obstructive sleep apnea (adult) (pediatric): Secondary | ICD-10-CM | POA: Diagnosis not present

## 2022-12-18 DIAGNOSIS — G4733 Obstructive sleep apnea (adult) (pediatric): Secondary | ICD-10-CM | POA: Diagnosis not present

## 2022-12-21 DIAGNOSIS — N182 Chronic kidney disease, stage 2 (mild): Secondary | ICD-10-CM | POA: Diagnosis not present

## 2022-12-21 DIAGNOSIS — Z6841 Body Mass Index (BMI) 40.0 and over, adult: Secondary | ICD-10-CM | POA: Diagnosis not present

## 2022-12-21 DIAGNOSIS — L578 Other skin changes due to chronic exposure to nonionizing radiation: Secondary | ICD-10-CM | POA: Diagnosis not present

## 2022-12-21 DIAGNOSIS — E7800 Pure hypercholesterolemia, unspecified: Secondary | ICD-10-CM | POA: Diagnosis not present

## 2022-12-21 DIAGNOSIS — G4733 Obstructive sleep apnea (adult) (pediatric): Secondary | ICD-10-CM | POA: Diagnosis not present

## 2022-12-21 DIAGNOSIS — R7303 Prediabetes: Secondary | ICD-10-CM | POA: Diagnosis not present

## 2022-12-21 DIAGNOSIS — N049 Nephrotic syndrome with unspecified morphologic changes: Secondary | ICD-10-CM | POA: Diagnosis not present

## 2022-12-21 DIAGNOSIS — Z720 Tobacco use: Secondary | ICD-10-CM | POA: Diagnosis not present

## 2022-12-21 DIAGNOSIS — R809 Proteinuria, unspecified: Secondary | ICD-10-CM | POA: Diagnosis not present

## 2022-12-21 DIAGNOSIS — I10 Essential (primary) hypertension: Secondary | ICD-10-CM | POA: Diagnosis not present

## 2023-01-02 ENCOUNTER — Other Ambulatory Visit: Payer: Self-pay | Admitting: Family

## 2023-01-02 DIAGNOSIS — I10 Essential (primary) hypertension: Secondary | ICD-10-CM

## 2023-01-09 ENCOUNTER — Other Ambulatory Visit (HOSPITAL_COMMUNITY): Payer: Self-pay

## 2023-01-09 MED ORDER — FUROSEMIDE 20 MG PO TABS
20.0000 mg | ORAL_TABLET | Freq: Every day | ORAL | 6 refills | Status: DC
  Filled 2023-01-10 – 2023-02-27 (×3): qty 30, 30d supply, fill #0
  Filled 2023-03-26: qty 30, 30d supply, fill #1
  Filled 2023-04-25: qty 30, 30d supply, fill #2
  Filled 2023-05-22: qty 30, 30d supply, fill #3

## 2023-01-09 MED FILL — Apixaban Tab 5 MG: ORAL | 20 days supply | Qty: 40 | Fill #0 | Status: CN

## 2023-01-09 MED FILL — Amlodipine Besylate Tab 10 MG (Base Equivalent): ORAL | 30 days supply | Qty: 30 | Fill #0 | Status: CN

## 2023-01-10 ENCOUNTER — Other Ambulatory Visit: Payer: Self-pay

## 2023-01-10 ENCOUNTER — Encounter: Payer: Self-pay | Admitting: Pharmacist

## 2023-01-10 ENCOUNTER — Other Ambulatory Visit (HOSPITAL_COMMUNITY): Payer: Self-pay

## 2023-01-10 MED FILL — Apixaban Tab 5 MG: ORAL | 20 days supply | Qty: 40 | Fill #0 | Status: CN

## 2023-01-13 ENCOUNTER — Other Ambulatory Visit: Payer: Self-pay

## 2023-01-14 DIAGNOSIS — G4733 Obstructive sleep apnea (adult) (pediatric): Secondary | ICD-10-CM | POA: Diagnosis not present

## 2023-01-18 DIAGNOSIS — G4733 Obstructive sleep apnea (adult) (pediatric): Secondary | ICD-10-CM | POA: Diagnosis not present

## 2023-02-07 ENCOUNTER — Other Ambulatory Visit (HOSPITAL_COMMUNITY): Payer: Self-pay

## 2023-02-07 ENCOUNTER — Other Ambulatory Visit: Payer: Self-pay

## 2023-02-07 ENCOUNTER — Encounter: Payer: Self-pay | Admitting: Family

## 2023-02-07 DIAGNOSIS — I10 Essential (primary) hypertension: Secondary | ICD-10-CM

## 2023-02-07 MED ORDER — METOPROLOL SUCCINATE ER 25 MG PO TB24
ORAL_TABLET | ORAL | 1 refills | Status: DC
  Filled 2023-02-07: qty 90, 93d supply, fill #0
  Filled 2023-02-13 – 2023-04-25 (×2): qty 90, 90d supply, fill #0
  Filled 2023-07-23: qty 90, 90d supply, fill #1

## 2023-02-10 ENCOUNTER — Other Ambulatory Visit (HOSPITAL_COMMUNITY): Payer: Self-pay

## 2023-02-10 MED ORDER — TELMISARTAN 40 MG PO TABS
40.0000 mg | ORAL_TABLET | Freq: Every day | ORAL | 3 refills | Status: DC
  Filled 2023-02-10: qty 90, 90d supply, fill #0
  Filled 2023-04-25: qty 90, 90d supply, fill #1
  Filled 2023-07-23: qty 90, 90d supply, fill #2
  Filled 2023-11-14: qty 90, 90d supply, fill #3

## 2023-02-13 ENCOUNTER — Other Ambulatory Visit: Payer: Self-pay

## 2023-02-13 ENCOUNTER — Other Ambulatory Visit: Payer: Self-pay | Admitting: Internal Medicine

## 2023-02-13 ENCOUNTER — Other Ambulatory Visit (HOSPITAL_COMMUNITY): Payer: Self-pay

## 2023-02-13 MED FILL — Amlodipine Besylate Tab 10 MG (Base Equivalent): ORAL | 30 days supply | Qty: 30 | Fill #0 | Status: AC

## 2023-02-14 ENCOUNTER — Other Ambulatory Visit: Payer: Self-pay

## 2023-02-14 ENCOUNTER — Other Ambulatory Visit (HOSPITAL_COMMUNITY): Payer: Self-pay

## 2023-02-14 DIAGNOSIS — G4733 Obstructive sleep apnea (adult) (pediatric): Secondary | ICD-10-CM | POA: Diagnosis not present

## 2023-02-14 MED ORDER — APIXABAN 5 MG PO TABS
5.0000 mg | ORAL_TABLET | Freq: Two times a day (BID) | ORAL | 5 refills | Status: DC
  Filled 2023-02-14: qty 60, 30d supply, fill #0
  Filled 2023-03-26: qty 60, 30d supply, fill #1
  Filled 2023-04-25 – 2023-06-13 (×3): qty 60, 30d supply, fill #2
  Filled 2023-07-10: qty 60, 30d supply, fill #3
  Filled 2023-09-13: qty 60, 30d supply, fill #4
  Filled 2023-10-14: qty 60, 30d supply, fill #5

## 2023-02-14 NOTE — Telephone Encounter (Signed)
Prescription refill request for Eliquis received. Indication:afib Last office visit:5/24 Scr:1.46  3/24 Age: 67 Weight:130  kg  Prescription refilled

## 2023-02-15 DIAGNOSIS — H524 Presbyopia: Secondary | ICD-10-CM | POA: Diagnosis not present

## 2023-02-15 DIAGNOSIS — H52223 Regular astigmatism, bilateral: Secondary | ICD-10-CM | POA: Diagnosis not present

## 2023-02-15 DIAGNOSIS — H5213 Myopia, bilateral: Secondary | ICD-10-CM | POA: Diagnosis not present

## 2023-02-18 DIAGNOSIS — G4733 Obstructive sleep apnea (adult) (pediatric): Secondary | ICD-10-CM | POA: Diagnosis not present

## 2023-02-27 ENCOUNTER — Other Ambulatory Visit: Payer: Self-pay

## 2023-02-27 ENCOUNTER — Other Ambulatory Visit (HOSPITAL_COMMUNITY): Payer: Self-pay

## 2023-02-27 MED ORDER — AMOXICILLIN 500 MG PO CAPS
2000.0000 mg | ORAL_CAPSULE | ORAL | 0 refills | Status: DC
  Filled 2023-02-27: qty 12, 3d supply, fill #0

## 2023-03-06 ENCOUNTER — Other Ambulatory Visit (HOSPITAL_COMMUNITY): Payer: Self-pay

## 2023-03-06 DIAGNOSIS — L578 Other skin changes due to chronic exposure to nonionizing radiation: Secondary | ICD-10-CM | POA: Diagnosis not present

## 2023-03-06 DIAGNOSIS — I10 Essential (primary) hypertension: Secondary | ICD-10-CM | POA: Diagnosis not present

## 2023-03-06 DIAGNOSIS — N049 Nephrotic syndrome with unspecified morphologic changes: Secondary | ICD-10-CM | POA: Diagnosis not present

## 2023-03-06 DIAGNOSIS — R809 Proteinuria, unspecified: Secondary | ICD-10-CM | POA: Diagnosis not present

## 2023-03-06 DIAGNOSIS — Z6841 Body Mass Index (BMI) 40.0 and over, adult: Secondary | ICD-10-CM | POA: Diagnosis not present

## 2023-03-06 DIAGNOSIS — E7800 Pure hypercholesterolemia, unspecified: Secondary | ICD-10-CM | POA: Diagnosis not present

## 2023-03-06 DIAGNOSIS — Z720 Tobacco use: Secondary | ICD-10-CM | POA: Diagnosis not present

## 2023-03-06 DIAGNOSIS — N182 Chronic kidney disease, stage 2 (mild): Secondary | ICD-10-CM | POA: Diagnosis not present

## 2023-03-06 DIAGNOSIS — E669 Obesity, unspecified: Secondary | ICD-10-CM | POA: Diagnosis not present

## 2023-03-06 DIAGNOSIS — R7303 Prediabetes: Secondary | ICD-10-CM | POA: Diagnosis not present

## 2023-03-06 DIAGNOSIS — G4733 Obstructive sleep apnea (adult) (pediatric): Secondary | ICD-10-CM | POA: Diagnosis not present

## 2023-03-06 MED ORDER — HYDRALAZINE HCL 25 MG PO TABS
25.0000 mg | ORAL_TABLET | Freq: Three times a day (TID) | ORAL | 5 refills | Status: DC
  Filled 2023-03-06: qty 90, 30d supply, fill #0
  Filled 2023-03-26 – 2023-03-27 (×2): qty 90, 30d supply, fill #1
  Filled 2023-04-25: qty 90, 30d supply, fill #2
  Filled 2023-05-22: qty 90, 30d supply, fill #3
  Filled 2023-07-10: qty 90, 30d supply, fill #4
  Filled 2023-09-13: qty 90, 30d supply, fill #5

## 2023-03-20 DIAGNOSIS — G4733 Obstructive sleep apnea (adult) (pediatric): Secondary | ICD-10-CM | POA: Diagnosis not present

## 2023-03-20 DIAGNOSIS — I10 Essential (primary) hypertension: Secondary | ICD-10-CM | POA: Diagnosis not present

## 2023-03-20 DIAGNOSIS — N182 Chronic kidney disease, stage 2 (mild): Secondary | ICD-10-CM | POA: Diagnosis not present

## 2023-03-26 ENCOUNTER — Other Ambulatory Visit (HOSPITAL_COMMUNITY): Payer: Self-pay

## 2023-03-26 ENCOUNTER — Other Ambulatory Visit: Payer: Self-pay | Admitting: Family

## 2023-03-26 DIAGNOSIS — I10 Essential (primary) hypertension: Secondary | ICD-10-CM

## 2023-03-27 ENCOUNTER — Other Ambulatory Visit: Payer: Self-pay

## 2023-03-27 MED ORDER — AMLODIPINE BESYLATE 10 MG PO TABS
10.0000 mg | ORAL_TABLET | Freq: Every day | ORAL | 3 refills | Status: DC
  Filled 2023-03-27: qty 30, 30d supply, fill #0
  Filled 2023-04-25: qty 30, 30d supply, fill #1
  Filled 2023-05-22: qty 30, 30d supply, fill #2
  Filled 2023-07-10: qty 30, 30d supply, fill #3

## 2023-03-30 DIAGNOSIS — G4733 Obstructive sleep apnea (adult) (pediatric): Secondary | ICD-10-CM | POA: Diagnosis not present

## 2023-04-05 ENCOUNTER — Encounter: Payer: Medicare HMO | Admitting: Family

## 2023-04-17 ENCOUNTER — Encounter: Payer: Self-pay | Admitting: Internal Medicine

## 2023-04-17 ENCOUNTER — Ambulatory Visit: Payer: Medicare HMO | Attending: Internal Medicine | Admitting: Internal Medicine

## 2023-04-17 VITALS — BP 126/72 | HR 69 | Ht 67.0 in | Wt 281.0 lb

## 2023-04-17 DIAGNOSIS — I480 Paroxysmal atrial fibrillation: Secondary | ICD-10-CM

## 2023-04-17 NOTE — Progress Notes (Signed)
Cardiology Office Note:    Date:  04/17/2023   ID:  Robert Larson, DOB [birth year], MRN [id number]  PCP:  Dulce Sellar, NP   [facility] Providers Cardiologist:  Maisie Fus, MD     Referring MD: Dulce Sellar, NP   No chief complaint on file. Hospital FU  History of Present Illness:    Robert Larson is a 67 y.o. male with a hx of sleep apnea on CAP, T2DM, HTN, paroxysmal atrial fibrillation onset June 2023 chads2vasc=2,  normal LV function and normal LA size.  He was seen in the hospital in June 23 until July with new onset afib. This was in the setting of diverticulitis c/b bowel perforation with multiple  intra-abdominal abscess s/p  ex-lap, bilateral open ended ureteral catheter placement, wound vac placed IR drain. He was septic growing E. Coli. With drain in place, was hesitant about AC. He was on farxiga with Dm2 prior, this was stopped as well as metformin. He was FTT/malnourished and TPN. He was dc'd on IV caspofungin and ertapenem. No AC. Has macrocytic anemia.   Today, he denies bleeding.  No palpitations. He denies SOB or CP. No syncope. He has his drain in place.   Interim hx 10/14/2022 He has gained weight. Restarted telmisartan in March. Bp well controlled at home. No palpitations. No orthopnea or PND. Has some dependant edema.  Interim hx 04/17/2023 He reports that he is doing well.  He has not had any hospitalizations.    Current Medications: Current Outpatient Medications on File Prior to Visit  Medication Sig Dispense Refill   amLODipine (NORVASC) 10 MG tablet Take 1 tablet (10 mg total) by mouth daily. 30 tablet 3   apixaban (ELIQUIS) 5 MG TABS tablet Take 1 tablet (5 mg total) by mouth 2 (two) times daily. 60 tablet 5   furosemide (LASIX) 20 MG tablet Take 1 tablet (20 mg total) by mouth daily. 30 tablet 6   hydrALAZINE (APRESOLINE) 25 MG tablet Take 1 tablet (25 mg total) by mouth 3 (three) times daily. 90 tablet 5   metoprolol  succinate (TOPROL-XL) 25 MG 24 hr tablet Take 0.5 tablets (12.5 mg total) by mouth at bedtime for 7 days, THEN 1 tablet (25 mg total) at bedtime. 90 tablet 1   telmisartan (MICARDIS) 40 MG tablet Take 1 tablet (40 mg total) by mouth daily. 90 tablet 3   amoxicillin (AMOXIL) 500 MG capsule Take 4 capsules (2,000 mg total) by mouth 1 hour prior to dental appointment. (Patient not taking: Reported on 04/17/2023) 12 capsule 0   Current Facility-Administered Medications on File Prior to Visit  Medication Dose Route Frequency Provider Last Rate Last Admin   0.9 %  sodium chloride infusion  500 mL Intravenous Once Hilarie Fredrickson, MD         Allergies:   Patient has no known allergies.   Social History   Socioeconomic History   Marital status: Married    Spouse name: Not on file   Number of children: 1   Years of education: Not on file   Highest education level: Not on file  Occupational History   Occupation: Engineer, water  Tobacco Use   Smoking status: Former    Current packs/day: 1.00    Types: Cigarettes   Smokeless tobacco: Never  Vaping Use   Vaping status: Never Used  Substance and Sexual Activity   Alcohol use: No   Drug use: Never   Sexual activity: Not on  file  Other Topics Concern   Not on file  Social History Narrative   Not on file   Social Determinants of Health   Financial Resource Strain: Not on file  Food Insecurity: Not on file  Transportation Needs: Not on file  Physical Activity: Not on file  Stress: Not on file  Social Connections: Not on file     Family History: The patient's family history includes Breast cancer in his mother; Lung cancer in his father. There is no history of Colon cancer or Rectal cancer.  ROS:   Please see the history of present illness.     All other systems reviewed and are negative.  EKGs/Labs/Other Studies Reviewed:    The following studies were reviewed today:   EKG:  EKG is  ordered today.  The ekg ordered today  demonstrates   03/16/2022-NSR  10/14/2022- NSR, IRBBB  12/12/2021 30 day- cardiac monitor , minimal episodes of afib in the early AM. Minimal ectopy.  TTE 11/09/2021- normal LV fxn, no significant valve dx   Recent Labs: 08/24/2022: ALT 12; BUN 11; Creatinine, Ser 1.46; Potassium 4.3; Sodium 137  Recent Lipid Panel    Component Value Date/Time   CHOL 189 08/24/2022 0900   TRIG 293.0 (H) 08/24/2022 0900   HDL 38.40 (L) 08/24/2022 0900   CHOLHDL 5 08/24/2022 0900   VLDL 58.6 (H) 08/24/2022 0900   LDLCALC 41 06/24/2021 0000   LDLDIRECT 104.0 08/24/2022 0900     Risk Assessment/Calculations:     Physical Exam:    VS:   There were no vitals filed for this visit.     Wt Readings from Last 3 Encounters:  10/14/22 286 lb 9.6 oz (130 kg)  09/01/22 270 lb (122.5 kg)  08/24/22 275 lb 3.2 oz (124.8 kg)     GEN:  Well nourished, well developed in no acute distress HEENT: Normal NECK: No JVD; No carotid bruits CARDIAC: RRR, no murmurs, rubs, gallops RESPIRATORY:  Clear to auscultation without rales, wheezing or rhonchi  ABDOMEN: Soft, non-tender, non-distended MUSCULOSKELETAL:  No edema; No deformity  SKIN: Warm and dry NEUROLOGIC:  Alert and oriented x 3 PSYCHIATRIC:  Normal affect   ASSESSMENT:    Paroxysmal Atrial Fibrillation: Diagnosed during hospitalization in June 2023 in the setting of diverticulitis status post bowel perforation.  Had recurrent afib. Chads2vasc = 2. Recommended AC. He has no bleeding. Can continue. Continue metoprolol XL 25 mg daily. Will continue with rate control for now. In sinus rhythm.   HTN:  well controlled at home. Cont norvasc 10 mg daily, telmisartan 40 mg daily and norvasc 10 mg daily.  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  This has been stable.  DM2: A1c 6.0%.  Not on SGLT2 2/2 history of septic shock  Dependent edema: compression stockings. Discussed weight loss.  PLAN:    In order of problems listed above:   Follow-up 1 year      Medication Adjustments/Labs and Tests Ordered: Current medicines are reviewed at length with the patient today.  Concerns regarding medicines are outlined above.  No orders of the defined types were placed in this encounter.  No orders of the defined types were placed in this encounter.   There are no Patient Instructions on file for this visit.   Signed, Maisie Fus, MD  04/17/2023 3:11 PM    [facility]

## 2023-04-17 NOTE — Patient Instructions (Signed)
Medication Instructions:  No changes  *If you need a refill on your cardiac medications before your next appointment, please call your pharmacy*   Lab Work: None  If you have labs (blood work) drawn today and your tests are completely normal, you will receive your results only by: MyChart Message (if you have MyChart) OR A paper copy in the mail If you have any lab test that is abnormal or we need to change your treatment, we will call you to review the results.   Testing/Procedures: None    Follow-Up: At [facility], you and your health needs are our priority.  As part of our continuing mission to provide you with exceptional heart care, we have created designated Provider Care Teams.  These Care Teams include your primary Cardiologist (physician) and Advanced Practice Providers (APPs -  Physician Assistants and Nurse Practitioners) who all work together to provide you with the care you need, when you need it.  Your next appointment:   1 year(s)  Provider:   Maisie Fus, MD

## 2023-04-20 DIAGNOSIS — G4733 Obstructive sleep apnea (adult) (pediatric): Secondary | ICD-10-CM | POA: Diagnosis not present

## 2023-04-25 ENCOUNTER — Other Ambulatory Visit: Payer: Self-pay

## 2023-04-25 ENCOUNTER — Other Ambulatory Visit (HOSPITAL_COMMUNITY): Payer: Self-pay

## 2023-04-30 DIAGNOSIS — G4733 Obstructive sleep apnea (adult) (pediatric): Secondary | ICD-10-CM | POA: Diagnosis not present

## 2023-05-03 ENCOUNTER — Other Ambulatory Visit: Payer: Self-pay

## 2023-05-10 ENCOUNTER — Ambulatory Visit: Payer: Medicare HMO

## 2023-05-10 VITALS — Wt 279.0 lb

## 2023-05-10 DIAGNOSIS — Z0000 Encounter for general adult medical examination without abnormal findings: Secondary | ICD-10-CM

## 2023-05-10 NOTE — Progress Notes (Signed)
Subjective:   Robert Larson is a 67 y.o. male who presents for an Initial Medicare Annual Wellness Visit.  Visit Complete: Virtual I connected with  Robert Larson on 05/10/23 by a audio enabled telemedicine application and verified that I am speaking with the correct person using two identifiers.  Patient Location: Home  Provider Location: Home Office  I discussed the limitations of evaluation and management by telemedicine. The patient expressed understanding and agreed to proceed.  Vital Signs: Because this visit was a virtual/telehealth visit, some criteria may be missing or patient reported. Any vitals not documented were not able to be obtained and vitals that have been documented are patient reported.  Cardiac Risk Factors include: advanced age (>68men, >53 women);diabetes mellitus;obesity (BMI >30kg/m2);male gender;hypertension     Objective:    Today's Vitals   05/10/23 1502  Weight: 279 lb (126.6 kg)   Body mass index is 43.7 kg/m.     05/10/2023    3:07 PM 09/01/2022    7:56 AM 11/27/2021   11:00 PM 11/26/2021    9:00 PM 11/24/2021    7:00 PM 11/23/2021    4:00 PM 11/23/2021   10:36 AM  Advanced Directives  Does Patient Have a Medical Advance Directive? Yes No Yes    Yes  Type of Estate agent of [place];Living will  Healthcare Power of [employer] Power of [employer] Power of [employer] Power of Attorney Healthcare Power of Attorney  Does patient want to make changes to medical advance directive?   No - Patient declined      Copy of Healthcare Power of Attorney in Chart? No - copy requested  No - copy requested No - copy requested   No - copy requested  Would patient like information on creating a medical advance directive?  No - Patient declined     No - Patient declined    Current Medications (verified) Outpatient Encounter Medications as of 05/10/2023  Medication Sig   amLODipine (NORVASC) 10 MG tablet Take 1  tablet (10 mg total) by mouth daily.   apixaban (ELIQUIS) 5 MG TABS tablet Take 1 tablet (5 mg total) by mouth 2 (two) times daily.   furosemide (LASIX) 20 MG tablet Take 1 tablet (20 mg total) by mouth daily.   hydrALAZINE (APRESOLINE) 25 MG tablet Take 1 tablet (25 mg total) by mouth 3 (three) times daily.   metoprolol succinate (TOPROL-XL) 25 MG 24 hr tablet Take 0.5 tablets (12.5 mg total) by mouth at bedtime for 7 days, THEN 1 tablet (25 mg total) at bedtime.   telmisartan (MICARDIS) 40 MG tablet Take 1 tablet (40 mg total) by mouth daily.   amoxicillin (AMOXIL) 500 MG capsule Take 4 capsules (2,000 mg total) by mouth 1 hour prior to dental appointment. (Patient not taking: Reported on 04/17/2023)   Facility-Administered Encounter Medications as of 05/10/2023  Medication   0.9 %  sodium chloride infusion    Allergies (verified) Patient has no known allergies.   History: Past Medical History:  Diagnosis Date   Abscess    Acute blood loss anemia 12/04/2021   Acute metabolic encephalopathy 12/04/2021   Acute renal failure superimposed on stage 3a chronic kidney disease (HCC) 11/07/2021   Acute respiratory failure (HCC) 11/29/2021   Acute right-sided low back pain with bilateral sciatica 03/13/2017   Alteration in electrolyte and fluid balance 11/29/2021   Atrial fibrillation [facility])    June 2023   Cigarette nicotine dependence without complication 07/19/2021  Coffee ground emesis 11/08/2021   Contusion of left hip and thigh 02/13/2017   Hypertension    Hyponatremia 11/07/2021   Intra-abdominal abscess (HCC) 11/07/2021   Osteoarthritis    Pressure injury of skin 11/28/2021   Protein in urine    Septic shock (HCC) 11/08/2021   Sleep apnea    Tobacco abuse 11/08/2021   Past Surgical History:  Procedure Laterality Date   ABSCESS DRAINAGE  02/18/2022   continuos drainage   COLONOSCOPY     COLONOSCOPY WITH PROPOFOL N/A 09/01/2022   Procedure: COLONOSCOPY WITH PROPOFOL;   Surgeon: Hilarie Fredrickson, MD;  Location: [facility];  Service: Gastroenterology;  Laterality: N/A;   CYSTOSCOPY WITH STENT PLACEMENT Bilateral 11/23/2021   Procedure: CYSTOSCOPY WITH STENT PLACEMENT;  Surgeon: Crista Elliot, MD;  Location: [facility];  Service: Urology;  Laterality: Bilateral;   IR PATIENT EVAL TECH 0-60 MINS  02/18/2022   IR RADIOLOGIST EVAL & MGMT  12/23/2021   IR RADIOLOGIST EVAL & MGMT  01/12/2022   IR RADIOLOGIST EVAL & MGMT  03/31/2022   LAPAROTOMY N/A 11/23/2021   Procedure: EXPLORATORY LAPAROTOMY, PARTIAL COLECTOMY AND COLOSTOMY;  Surgeon: Emelia Loron, MD;  Location: [facility];  Service: General;  Laterality: N/A;   POLYPECTOMY  09/01/2022   Procedure: POLYPECTOMY;  Surgeon: Hilarie Fredrickson, MD;  Location: [facility];  Service: Gastroenterology;;   REPLACEMENT TOTAL HIP W/  RESURFACING IMPLANTS     Left    Family History  Problem Relation Age of Onset   Breast cancer Mother    Lung cancer Father    Colon cancer Neg Hx    Rectal cancer Neg Hx    Social History   Socioeconomic History   Marital status: Divorced    Spouse name: Not on file   Number of children: 1   Years of education: Not on file   Highest education level: Not on file  Occupational History   Occupation: body shop owner  Tobacco Use   Smoking status: Former    Current packs/day: 1.00    Types: Cigarettes   Smokeless tobacco: Never  Vaping Use   Vaping status: Never Used  Substance and Sexual Activity   Alcohol use: No   Drug use: Never   Sexual activity: Not on file  Other Topics Concern   Not on file  Social History Narrative   Not on file   Social Determinants of Health   Financial Resource Strain: Low Risk  (05/10/2023)   Overall Financial Resource Strain (CARDIA)    Difficulty of Paying Living Expenses: Not hard at all  Food Insecurity: No Food Insecurity (05/10/2023)   Hunger Vital Sign    Worried About Radiation protection practitioner of Food in the Last Year: Never true    Ran Out of Food in  the Last Year: Never true  Transportation Needs: No Transportation Needs (05/10/2023)   PRAPARE - Administrator, Civil Service (Medical): No    Lack of Transportation (Non-Medical): No  Physical Activity: Sufficiently Active (05/10/2023)   Exercise Vital Sign    Days of Exercise per Week: 5 days    Minutes of Exercise per Session: 50 min  Stress: No Stress Concern Present (05/10/2023)   [employer] of Occupational Health - Occupational Stress Questionnaire    Feeling of Stress : Not at all  Social Connections: Moderately Integrated (05/10/2023)   Social Connection and Isolation Panel [NHANES]    Frequency of Communication with Friends and Family: More than three times  a week    Frequency of Social Gatherings with Friends and Family: More than three times a week    Attends Religious Services: More than 4 times per year    Active Member of [employer] or Organizations: Yes    Attends Banker Meetings: 1 to 4 times per year    Marital Status: Divorced    Tobacco Counseling Counseling given: Not Answered   Clinical Intake:  Pre-visit preparation completed: Yes  Pain : No/denies pain     BMI - recorded: 43.7 Nutritional Status: BMI > 30  Obese Nutritional Risks: None Diabetes: Yes CBG done?: No Did pt. bring in CBG monitor from home?: No  How often do you need to have someone help you when you read instructions, pamphlets, or other written materials from your doctor or pharmacy?: 1 - Never  Interpreter Needed?: No  Information entered by :: Lanier Ensign, LPN   Activities of Daily Living    05/10/2023    3:04 PM  In your present state of health, do you have any difficulty performing the following activities:  Hearing? 0  Vision? 0  Difficulty concentrating or making decisions? 0  Walking or climbing stairs? 0  Dressing or bathing? 0  Doing errands, shopping? 0  Preparing Food and eating ? N  Using the Toilet? N  In the past six months, have  you accidently leaked urine? N  Do you have problems with loss of bowel control? N  Managing your Medications? N  Managing your Finances? N  Housekeeping or managing your Housekeeping? N    Patient Care Team: Dulce Sellar, NP as PCP - General (Family Medicine) Maisie Fus, MD as PCP - Cardiology (Cardiology) Hilarie Fredrickson, MD as Consulting Physician (Gastroenterology) August Saucer Corrie Mckusick, MD as Consulting Physician (Orthopedic Surgery)  Indicate any recent Medical Services you may have received from other than [facility] providers in the past year (date may be approximate).     Assessment:   This is a routine wellness examination for [employer].  Hearing/Vision screen Hearing Screening - Comments:: Pt denies any hearing issues  Vision Screening - Comments:: Pt follows up with [facility] for annual eye exams    Goals Addressed             This Visit's Progress    Patient Stated       Lose weight        Depression Screen    05/10/2023    3:08 PM 04/25/2022    4:02 PM 01/18/2022   10:39 AM 12/21/2021    8:57 AM 09/16/2021    8:09 AM  PHQ 2/9 Scores  PHQ - 2 Score 0 0 0 0 0    Fall Risk    05/10/2023    3:09 PM 04/25/2022    4:00 PM 01/18/2022   10:39 AM 12/21/2021    8:56 AM  Fall Risk   Falls in the past year? 0 0 0 0  Number falls in past yr: 0  0 0  Injury with Fall? 0  0 0  Risk for fall due to : No Fall Risks  No Fall Risks Impaired mobility  Follow up Falls prevention discussed Falls evaluation completed Falls evaluation completed Falls evaluation completed    MEDICARE RISK AT HOME: Medicare Risk at Home Any stairs in or around the home?: Yes If so, are there any without handrails?: No Home free of loose throw rugs in walkways, pet beds, electrical cords, etc?: Yes Adequate lighting  in your home to reduce risk of falls?: Yes Life alert?: No Use of a cane, walker or w/c?: No Grab bars in the bathroom?: Yes Shower chair or bench in shower?: Yes Elevated  toilet seat or a handicapped toilet?: Yes  TIMED UP AND GO:  Was the test performed? No    Cognitive Function:        05/10/2023    3:10 PM  6CIT Screen  What Year? 0 points  What month? 0 points  What time? 0 points  Count back from 20 0 points  Months in reverse 4 points  Repeat phrase 0 points  Total Score 4 points    Immunizations Immunization History  Administered Date(s) Administered   Influenza-Unspecified 03/06/2021   PFIZER(Purple Top)SARS-COV-2 Vaccination 08/15/2019, 09/05/2019   Pfizer Covid-19 Vaccine Bivalent Booster 60yrs & up 03/26/2020   Pneumococcal Polysaccharide-23 03/23/2019   Tdap 06/07/2019   Zoster Recombinant(Shingrix) 08/26/2011    TDAP status: Up to date  Flu Vaccine status: Due, Education has been provided regarding the importance of this vaccine. Advised may receive this vaccine at local pharmacy or Health Dept. Aware to provide a copy of the vaccination record if obtained from local pharmacy or Health Dept. Verbalized acceptance and understanding.  Pneumococcal vaccine status: Due, Education has been provided regarding the importance of this vaccine. Advised may receive this vaccine at local pharmacy or Health Dept. Aware to provide a copy of the vaccination record if obtained from local pharmacy or Health Dept. Verbalized acceptance and understanding.  Covid-19 vaccine status: Information provided on how to obtain vaccines.   Qualifies for Shingles Vaccine? Yes   Zostavax completed No   Shingrix Completed?: No.    Education has been provided regarding the importance of this vaccine. Patient has been advised to call insurance company to determine out of pocket expense if they have not yet received this vaccine. Advised may also receive vaccine at local pharmacy or Health Dept. Verbalized acceptance and understanding.  Screening Tests Health Maintenance  Topic Date Due   FOOT EXAM  Never done   OPHTHALMOLOGY EXAM  Never done   Hepatitis C  Screening  Never done   Zoster Vaccines- Shingrix (2 of 2) 10/21/2011   Pneumonia Vaccine 40+ Years old (2 of 2 - PCV) 03/22/2020   Diabetic kidney evaluation - Urine ACR  06/24/2022   INFLUENZA VACCINE  01/05/2023   COVID-19 Vaccine (4 - 2023-24 season) 02/05/2023   HEMOGLOBIN A1C  02/24/2023   Colonoscopy  09/01/2023   Diabetic kidney evaluation - eGFR measurement  08/24/2023   Medicare Annual Wellness (AWV)  05/09/2024   DTaP/Tdap/Td (2 - Td or Tdap) 06/06/2029   HPV VACCINES  Aged Out    Health Maintenance  Health Maintenance Due  Topic Date Due   FOOT EXAM  Never done   OPHTHALMOLOGY EXAM  Never done   Hepatitis C Screening  Never done   Zoster Vaccines- Shingrix (2 of 2) 10/21/2011   Pneumonia Vaccine 59+ Years old (2 of 2 - PCV) 03/22/2020   Diabetic kidney evaluation - Urine ACR  06/24/2022   INFLUENZA VACCINE  01/05/2023   COVID-19 Vaccine (4 - 2023-24 season) 02/05/2023   HEMOGLOBIN A1C  02/24/2023   Colonoscopy  09/01/2023    Colonoscopy due 09/01/23  Additional Screening:  Hepatitis C Screening: does qualify;   Vision Screening: Recommended annual ophthalmology exams for early detection of glaucoma and other disorders of the eye. Is the patient up to date with their annual eye exam?  No  record request information from [facility]  Who is the provider or what is the name of the office in which the patient attends annual eye exams? [facility]  If pt is not established with a provider, would they like to be referred to a provider to establish care? No .   Dental Screening: Recommended annual dental exams for proper oral hygiene  Diabetic Foot Exam: Diabetic Foot Exam: Overdue, Pt has been advised about the importance in completing this exam. Pt is scheduled for diabetic foot exam on 05/11/23 next appt .  Community Resource Referral / Chronic Care Management: CRR required this visit?  No   CCM required this visit?  No    Plan:     I have personally reviewed and  noted the following in the patient's chart:   Medical and social history Use of alcohol, tobacco or illicit drugs  Current medications and supplements including opioid prescriptions. Patient is not currently taking opioid prescriptions. Functional ability and status Nutritional status Physical activity Advanced directives List of other physicians Hospitalizations, surgeries, and ER visits in previous 12 months Vitals Screenings to include cognitive, depression, and falls Referrals and appointments  In addition, I have reviewed and discussed with patient certain preventive protocols, quality metrics, and best practice recommendations. A written personalized care plan for preventive services as well as general preventive health recommendations were provided to patient.     Marzella Schlein, LPN   16/06/958   After Visit Summary: (MyChart) Due to this being a telephonic visit, the after visit summary with patients personalized plan was offered to patient via MyChart   Nurse Notes: Pt stated he had vaccine was unable to give me any dates for current year. Pt also stated he was not a diabetic.

## 2023-05-10 NOTE — Patient Instructions (Signed)
Robert Larson , Thank you for taking time to come for your Medicare Wellness Visit. I appreciate your ongoing commitment to your health goals. Please review the following plan we discussed and let me know if I can assist you in the future.   Referrals/Orders/Follow-Ups/Clinician Recommendations: Aim for 30 minutes of exercise or brisk walking, 6-8 glasses of water, and 5 servings of fruits and vegetables each day. Continue to work on losing weight   This is a list of the screening recommended for you and due dates:  Health Maintenance  Topic Date Due   Medicare Annual Wellness Visit  Never done   Complete foot exam   Never done   Eye exam for diabetics  Never done   Hepatitis C Screening  Never done   Zoster (Shingles) Vaccine (2 of 2) 10/21/2011   Pneumonia Vaccine (2 of 2 - PCV) 03/22/2020   Yearly kidney health urinalysis for diabetes  06/24/2022   Flu Shot  01/05/2023   COVID-19 Vaccine (4 - 2023-24 season) 02/05/2023   Hemoglobin A1C  02/24/2023   Colon Cancer Screening  09/01/2023   Yearly kidney function blood test for diabetes  08/24/2023   DTaP/Tdap/Td vaccine (2 - Td or Tdap) 06/06/2029   HPV Vaccine  Aged Out    Advanced directives: (Copy Requested) Please bring a copy of your health care power of attorney and living will to the office to be added to your chart at your convenience.  Next Medicare Annual Wellness Visit scheduled for next year: Yes

## 2023-05-11 ENCOUNTER — Encounter: Payer: Self-pay | Admitting: Family

## 2023-05-11 ENCOUNTER — Ambulatory Visit: Payer: Medicare HMO | Admitting: Family

## 2023-05-11 VITALS — BP 150/80 | HR 65 | Temp 97.0°F | Ht 67.0 in | Wt 285.5 lb

## 2023-05-11 DIAGNOSIS — N1831 Chronic kidney disease, stage 3a: Secondary | ICD-10-CM

## 2023-05-11 DIAGNOSIS — I10 Essential (primary) hypertension: Secondary | ICD-10-CM | POA: Diagnosis not present

## 2023-05-11 DIAGNOSIS — G4733 Obstructive sleep apnea (adult) (pediatric): Secondary | ICD-10-CM | POA: Diagnosis not present

## 2023-05-11 DIAGNOSIS — R7303 Prediabetes: Secondary | ICD-10-CM | POA: Diagnosis not present

## 2023-05-11 DIAGNOSIS — Z1159 Encounter for screening for other viral diseases: Secondary | ICD-10-CM | POA: Diagnosis not present

## 2023-05-11 DIAGNOSIS — Z6841 Body Mass Index (BMI) 40.0 and over, adult: Secondary | ICD-10-CM

## 2023-05-11 DIAGNOSIS — Z0000 Encounter for general adult medical examination without abnormal findings: Secondary | ICD-10-CM

## 2023-05-11 DIAGNOSIS — E66813 Obesity, class 3: Secondary | ICD-10-CM

## 2023-05-11 DIAGNOSIS — I480 Paroxysmal atrial fibrillation: Secondary | ICD-10-CM

## 2023-05-11 LAB — COMPREHENSIVE METABOLIC PANEL
ALT: 17 U/L (ref 0–53)
AST: 21 U/L (ref 0–37)
Albumin: 3.3 g/dL — ABNORMAL LOW (ref 3.5–5.2)
Alkaline Phosphatase: 72 U/L (ref 39–117)
BUN: 19 mg/dL (ref 6–23)
CO2: 23 meq/L (ref 19–32)
Calcium: 9.1 mg/dL (ref 8.4–10.5)
Chloride: 104 meq/L (ref 96–112)
Creatinine, Ser: 1.92 mg/dL — ABNORMAL HIGH (ref 0.40–1.50)
GFR: 35.97 mL/min — ABNORMAL LOW (ref >60.00)
Glucose, Bld: 108 mg/dL — ABNORMAL HIGH (ref 70–99)
Potassium: 4.1 meq/L (ref 3.5–5.1)
Sodium: 135 meq/L (ref 135–145)
Total Bilirubin: 0.4 mg/dL (ref 0.2–1.2)
Total Protein: 7 g/dL (ref 6.0–8.3)

## 2023-05-11 LAB — CBC WITH DIFFERENTIAL/PLATELET
Basophils Absolute: 0.1 10*3/uL (ref 0.0–0.1)
Basophils Relative: 1.1 % (ref 0.0–3.0)
Eosinophils Absolute: 0.9 10*3/uL — ABNORMAL HIGH (ref 0.0–0.7)
Eosinophils Relative: 12.3 % — ABNORMAL HIGH (ref 0.0–5.0)
HCT: 45.9 % (ref 39.0–52.0)
Hemoglobin: 15.7 g/dL (ref 13.0–17.0)
Lymphocytes Relative: 26.9 % (ref 12.0–46.0)
Lymphs Abs: 1.9 10*3/uL (ref 0.7–4.0)
MCHC: 34.3 g/dL (ref 30.0–36.0)
MCV: 98.7 fL (ref 78.0–100.0)
Monocytes Absolute: 0.6 10*3/uL (ref 0.1–1.0)
Monocytes Relative: 9 % (ref 3.0–12.0)
Neutro Abs: 3.6 10*3/uL (ref 1.4–7.7)
Neutrophils Relative %: 50.7 % (ref 43.0–77.0)
Platelets: 194 10*3/uL (ref 150.0–400.0)
RBC: 4.65 Mil/uL (ref 4.22–5.81)
RDW: 13.5 % (ref 11.5–15.5)
WBC: 7.1 10*3/uL (ref 4.0–10.5)

## 2023-05-11 LAB — HEMOGLOBIN A1C: Hgb A1c MFr Bld: 6 % (ref 4.6–6.5)

## 2023-05-11 NOTE — Patient Instructions (Signed)
It was very nice to see you today!   I will review your lab results via MyChart in a few days.  I will look at Dr Renaye Rakers latest notes and then let you know about ordering the [facility] for weight loss. Remember to hydrate with mostly water every day! Watch your weight! Watch your carb intake, reduce your portion size, and eat most calories earlier in the day.  Have a wonderful, merry, Christmas!     PLEASE NOTE:  If you had any lab tests please let us know if you have not heard back within a few days. You may see your results on MyChart before we have a chance to review them but we will give you a call once they are reviewed by [place]. If we ordered any referrals today, please let us know if you have not heard from their office within the next week.

## 2023-05-11 NOTE — Assessment & Plan Note (Addendum)
followed by Dr. Salena Saner at [facility] taking HTN meds, elevated today last seen 12/2022 by Dr C, requesting notes checking labs today f/u 6 mos - alternate with nephrology

## 2023-05-11 NOTE — Assessment & Plan Note (Signed)
Patient uses CPAP machine nightly and cleans it daily with SoClean machine. Some coughing at night reported. -Continue nightly CPAP use.

## 2023-05-11 NOTE — Progress Notes (Signed)
Phone: [phone]  Subjective:  Patient 67 y.o. male presenting for annual physical.  Chief Complaint  Patient presents with   Annual Exam    Fasting w/ labs   Discussed the use of AI scribe software for clinical note transcription with the patient, who gave verbal consent to proceed.  History of Present Illness   Fredis, a patient with a history of hypertension, CKD, and sleep apnea, presents for a routine check-up. He reports adherence to his current medication regimen, which includes amlodipine, Eliquis, Lasix, hydralazine, and metoprolol. He has been exercising regularly, using an elliptical machine for 45 minutes each night, and reports feeling good and regaining energy.  Maxey also discusses his use of a CPAP machine for sleep apnea, expressing concerns about potential bacterial infection. He cleans his CPAP machine daily with a SoClean machine and has recently changed the filter system. He reports occasional coughing at night, which he attributes to the CPAP machine.  Gregori's weight has increased since his last visit, and there is a discussion about potential borderline diabetes. He reports drinking diet green tea and occasionally using flavor mix-ins with water. 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     See problem oriented charting- ROS- full  review of systems was completed and negative.  The following were reviewed and entered/updated in epic: Past Medical History:  Diagnosis Date   Abscess    Acute blood loss anemia 12/04/2021   Acute metabolic encephalopathy 12/04/2021   Acute renal failure superimposed on stage 3a chronic kidney disease (HCC) 11/07/2021   Acute respiratory failure (HCC) 11/29/2021   Acute right-sided low back pain with bilateral sciatica 03/13/2017   Alteration in electrolyte and fluid balance 11/29/2021   Atrial fibrillation (HCC)    June 2023   Cigarette nicotine dependence without complication  07/19/2021   Coffee ground emesis 11/08/2021   Contusion of left hip and thigh 02/13/2017   Hypertension    Hyponatremia 11/07/2021   Intra-abdominal abscess (HCC) 11/07/2021   Osteoarthritis    Pressure injury of skin 11/28/2021   Protein in urine    Septic shock (HCC) 11/08/2021   Sleep apnea    Tobacco abuse 11/08/2021   Patient Active Problem List   Diagnosis Date Noted   Benign neoplasm of ascending colon 09/01/2022   Benign neoplasm of sigmoid colon 09/01/2022   Benign neoplasm of transverse colon 09/01/2022   Abnormal findings on diagnostic imaging of liver 12/10/2021   Obesity, Class III, BMI 40-49.9 (morbid obesity) (HCC) 12/04/2021   Paroxysmal atrial fibrillation with RVR (HCC) 12/04/2021   Acute respiratory failure with hypoxia (HCC) 12/04/2021   GERD (gastroesophageal reflux disease) 11/29/2021   OSA (obstructive sleep apnea) 11/08/2021   Osteoarthritis 11/08/2021   CKD (chronic kidney disease) stage 3, GFR 30-59 ml/min (HCC) 11/08/2021   Adrenal nodule (HCC) 11/08/2021   History of colonic polyps 11/08/2021   Spinal stenosis of lumbar region 11/08/2021   Renal cyst 11/08/2021   Cholelithiasis 11/08/2021   Diverticulosis 11/07/2021   Essential hypertension 09/16/2021   Type 2 diabetes mellitus with morbid obesity (HCC) 09/16/2021   Abnormal weight gain 07/19/2021   Past Surgical History:  Procedure Laterality Date   ABSCESS DRAINAGE  02/18/2022   continuos drainage   COLONOSCOPY     COLONOSCOPY WITH PROPOFOL N/A 09/01/2022   Procedure: COLONOSCOPY WITH PROPOFOL;  Surgeon: Hilarie Fredrickson, MD;  Location: [facility];  Service: Gastroenterology;  Laterality: N/A;   CYSTOSCOPY WITH STENT PLACEMENT Bilateral  11/23/2021   Procedure: CYSTOSCOPY WITH STENT PLACEMENT;  Surgeon: Crista Elliot, MD;  Location: [facility];  Service: Urology;  Laterality: Bilateral;   IR PATIENT EVAL TECH 0-60 MINS  02/18/2022   IR RADIOLOGIST EVAL & MGMT  12/23/2021   IR RADIOLOGIST EVAL &  MGMT  01/12/2022   IR RADIOLOGIST EVAL & MGMT  03/31/2022   LAPAROTOMY N/A 11/23/2021   Procedure: EXPLORATORY LAPAROTOMY, PARTIAL COLECTOMY AND COLOSTOMY;  Surgeon: Emelia Loron, MD;  Location: [facility];  Service: General;  Laterality: N/A;   POLYPECTOMY  09/01/2022   Procedure: POLYPECTOMY;  Surgeon: Hilarie Fredrickson, MD;  Location: [facility];  Service: Gastroenterology;;   REPLACEMENT TOTAL HIP W/  RESURFACING IMPLANTS     Left     Family History  Problem Relation Age of Onset   Breast cancer Mother    Lung cancer Father    Colon cancer Neg Hx    Rectal cancer Neg Hx     Medications- reviewed and updated Current Outpatient Medications  Medication Sig Dispense Refill   amLODipine (NORVASC) 10 MG tablet Take 1 tablet (10 mg total) by mouth daily. 30 tablet 3   apixaban (ELIQUIS) 5 MG TABS tablet Take 1 tablet (5 mg total) by mouth 2 (two) times daily. 60 tablet 5   furosemide (LASIX) 20 MG tablet Take 1 tablet (20 mg total) by mouth daily. 30 tablet 6   hydrALAZINE (APRESOLINE) 25 MG tablet Take 1 tablet (25 mg total) by mouth 3 (three) times daily. 90 tablet 5   metoprolol succinate (TOPROL-XL) 25 MG 24 hr tablet Take 0.5 tablets (12.5 mg total) by mouth at bedtime for 7 days, THEN 1 tablet (25 mg total) at bedtime. 90 tablet 1   telmisartan (MICARDIS) 40 MG tablet Take 1 tablet (40 mg total) by mouth daily. 90 tablet 3   Current Facility-Administered Medications  Medication Dose Route Frequency Provider Last Rate Last Admin   0.9 %  sodium chloride infusion  500 mL Intravenous Once Hilarie Fredrickson, MD       Allergies-reviewed and updated No Known Allergies  Social History   Social History Narrative   Not on file    Objective:  BP (!) 150/80   Pulse 65   Temp (!) 97 F (36.1 C) (Temporal)   Ht 5\' 7"  (1.702 m)   Wt 285 lb 8 oz (129.5 kg)   SpO2 96%   BMI 44.72 kg/m  Physical Exam Vitals and nursing note reviewed.  Constitutional:      General: He is not in acute  distress.    Appearance: Normal appearance. He is morbidly obese.  HENT:     Head: Normocephalic.     Right Ear: Tympanic membrane and external ear normal.     Left Ear: Tympanic membrane and external ear normal.     Nose: Nose normal.     Mouth/Throat:     Mouth: Mucous membranes are moist.  Eyes:     Extraocular Movements: Extraocular movements intact.  Cardiovascular:     Rate and Rhythm: Normal rate and regular rhythm.  Pulmonary:     Effort: Pulmonary effort is normal.     Breath sounds: Normal breath sounds.  Abdominal:     General: Abdomen is flat. There is no distension.     Palpations: Abdomen is soft.     Tenderness: There is no abdominal tenderness.  Musculoskeletal:        General: Normal range of motion.  Cervical back: Normal range of motion.  Skin:    General: Skin is warm and dry.  Neurological:     Mental Status: He is alert and oriented to person, place, and time.  Psychiatric:        Mood and Affect: Mood normal.        Behavior: Behavior normal.        Judgment: Judgment normal.     Assessment and Plan   Health Maintenance counseling: 1. Anticipatory guidance: Patient counseled regarding regular dental exams q6 months, eye exams yearly, avoiding smoking and second hand smoke, limiting alcohol to 2 beverages per day.   2. Risk factor reduction:  Advised patient of need for regular exercise and diet rich in fruits and vegetables to reduce risk of heart attack and stroke. Exercise- using treadmill nightly.   Wt Readings from Last 3 Encounters:  05/11/23 285 lb 8 oz (129.5 kg)  05/10/23 279 lb (126.6 kg)  04/17/23 281 lb (127.5 kg)   3. Immunizations/screenings/ancillary studies Immunization History  Administered Date(s) Administered   Influenza-Unspecified 03/06/2021   PFIZER(Purple Top)SARS-COV-2 Vaccination 08/15/2019, 09/05/2019   Pfizer Covid-19 Vaccine Bivalent Booster 34yrs & up 03/26/2020   Pneumococcal Polysaccharide-23 03/23/2019   Tdap  06/07/2019   Zoster Recombinant(Shingrix) 08/26/2011   Health Maintenance Due  Topic Date Due   FOOT EXAM  Never done   HEMOGLOBIN A1C  02/24/2023   Colonoscopy  09/01/2023    4. Prostate cancer screening >55yo - risk factors? No results found for: "PSA" pt followed by nephrology, will request last visit notes. 5. Colon cancer screening:due 08/2023 6. Skin cancer screening-  advised regular sunscreen use. Denies worrisome, changing, or new skin lesions.  7. Smoking associated screening (lung cancer screening, AAA screen 65-75, UA)- non- smoker 8. STD screening - N/A 9. Alcohol screening: none  Assessment and Plan    HTN - Patient is on multiple medications including Lasix, Hydralazine, Metoprolol, and Telmisartan. Patient reports feeling good and regaining energy. No changes in medication regimen were discussed. -Continue current medication regimen.  Atrial Fibrillation - Patient is on Eliquis & Metoprolol for atrial fibrillation. No recent episodes or complications reported. -Continue medications daily as directed.  Weight Management - Patient's weight has increased since last visit. Discussed the importance of maintaining a healthy weight and regular exercise. continues to gain weight, up to 285 today checking labs, A1C may have increased again pt discussed Wegovy with Dr. Salena Saner  will review lab results, advised pt to check insurance benefits on wt loss meds, may be hard to get covered if no T2DM continue to advise on wt loss strategies, pt is exercising most evenings  OSA - Patient uses CPAP machine nightly and cleans it daily with SoClean machine. Some coughing at night reported. -Continue nightly CPAP use.  Annual Physical -  Patient recently had dental work and eye exam. Upcoming colonoscopy mentioned. Patient is due for Hepatitis C screening. -Labs to be drawn today including metabolic panel and blood count. A1c to be checked due to previous borderline result. -Order  Hepatitis C screening.  Borderline diabetes- last A1C 5.9 08/2022 but pt has gained weight again, rechecking today.  -     Hemoglobin A1c  Need for hepatitis C screening test  -     Hepatitis C antibody   Recommended follow up:  Return in about 6 months (around 11/09/2023) for any future concerns, HTN, fasting labs. Future Appointments  Date Time Provider Department Center  05/16/2024  2:20 PM [facility] ANNUAL  WELLNESS VISIT 1 [facility]   Lab/Order associations: fasting    Dulce Sellar, NP

## 2023-05-11 NOTE — Assessment & Plan Note (Addendum)
chronic, Unstable continues to gain weight, up to 285 today checking labs, A1C may have increased again pt discussed Wegovy with Dr. Salena Saner  will see lab results, advised pt to check insurance benefits on wt loss meds, may be hard to get covered if no T2DM continue to advise on wt loss strategies, pt is exercising most evenings continue to monitor

## 2023-05-11 NOTE — Assessment & Plan Note (Signed)
chronic since 2023 followed by Cardiology, last seen 04/2023 advised to stay on Eliquis for life taking Metoprolol 25mg  ER qhs, Hydralazine 25mg  tid SR today continue to monitor

## 2023-05-11 NOTE — Assessment & Plan Note (Addendum)
Chronic, followed by cardiology taking Amlodipine 10mg  and Metoprolol 25mg  ER, Telmisartan 40mg  BP elevated today still followed by nephrology, sees every 6mos, last seen 12/2022, requesting notes checking labs today f/u 6 mos - alternate with nephrology

## 2023-05-12 LAB — HEPATITIS C ANTIBODY: Hepatitis C Ab: NONREACTIVE

## 2023-05-22 ENCOUNTER — Encounter: Payer: Self-pay | Admitting: Family

## 2023-05-23 ENCOUNTER — Other Ambulatory Visit (HOSPITAL_COMMUNITY): Payer: Self-pay

## 2023-05-23 ENCOUNTER — Other Ambulatory Visit: Payer: Self-pay

## 2023-05-30 DIAGNOSIS — G4733 Obstructive sleep apnea (adult) (pediatric): Secondary | ICD-10-CM | POA: Diagnosis not present

## 2023-06-13 ENCOUNTER — Other Ambulatory Visit (HOSPITAL_COMMUNITY): Payer: Self-pay

## 2023-06-13 ENCOUNTER — Other Ambulatory Visit: Payer: Self-pay

## 2023-06-14 DIAGNOSIS — N182 Chronic kidney disease, stage 2 (mild): Secondary | ICD-10-CM | POA: Diagnosis not present

## 2023-06-30 ENCOUNTER — Other Ambulatory Visit (HOSPITAL_COMMUNITY): Payer: Self-pay

## 2023-06-30 MED ORDER — WEGOVY 0.25 MG/0.5ML ~~LOC~~ SOAJ
0.2500 mg | SUBCUTANEOUS | 0 refills | Status: DC
  Filled 2023-06-30: qty 2, 28d supply, fill #0

## 2023-07-10 ENCOUNTER — Other Ambulatory Visit (HOSPITAL_COMMUNITY): Payer: Self-pay

## 2023-07-10 MED ORDER — FUROSEMIDE 20 MG PO TABS
20.0000 mg | ORAL_TABLET | Freq: Every day | ORAL | 6 refills | Status: DC
  Filled 2023-07-10: qty 30, 30d supply, fill #0
  Filled 2023-09-13: qty 30, 30d supply, fill #1
  Filled 2023-10-14: qty 30, 30d supply, fill #2
  Filled 2023-11-14: qty 30, 30d supply, fill #3
  Filled 2023-12-13: qty 30, 30d supply, fill #4
  Filled 2024-01-14: qty 30, 30d supply, fill #5
  Filled 2024-02-05 – 2024-02-29 (×2): qty 30, 30d supply, fill #6

## 2023-07-24 ENCOUNTER — Other Ambulatory Visit: Payer: Self-pay

## 2023-08-23 DIAGNOSIS — G4733 Obstructive sleep apnea (adult) (pediatric): Secondary | ICD-10-CM | POA: Diagnosis not present

## 2023-09-06 DIAGNOSIS — E7800 Pure hypercholesterolemia, unspecified: Secondary | ICD-10-CM | POA: Diagnosis not present

## 2023-09-06 DIAGNOSIS — N182 Chronic kidney disease, stage 2 (mild): Secondary | ICD-10-CM | POA: Diagnosis not present

## 2023-09-06 LAB — LAB REPORT - SCANNED: EGFR: 38

## 2023-09-13 ENCOUNTER — Other Ambulatory Visit: Payer: Self-pay | Admitting: Family

## 2023-09-13 DIAGNOSIS — I10 Essential (primary) hypertension: Secondary | ICD-10-CM

## 2023-09-13 DIAGNOSIS — Z720 Tobacco use: Secondary | ICD-10-CM | POA: Diagnosis not present

## 2023-09-13 DIAGNOSIS — R7303 Prediabetes: Secondary | ICD-10-CM | POA: Diagnosis not present

## 2023-09-13 DIAGNOSIS — L578 Other skin changes due to chronic exposure to nonionizing radiation: Secondary | ICD-10-CM | POA: Diagnosis not present

## 2023-09-13 DIAGNOSIS — Z6841 Body Mass Index (BMI) 40.0 and over, adult: Secondary | ICD-10-CM | POA: Diagnosis not present

## 2023-09-13 DIAGNOSIS — N182 Chronic kidney disease, stage 2 (mild): Secondary | ICD-10-CM | POA: Diagnosis not present

## 2023-09-13 DIAGNOSIS — E7800 Pure hypercholesterolemia, unspecified: Secondary | ICD-10-CM | POA: Diagnosis not present

## 2023-09-13 DIAGNOSIS — N049 Nephrotic syndrome with unspecified morphologic changes: Secondary | ICD-10-CM | POA: Diagnosis not present

## 2023-09-13 DIAGNOSIS — R809 Proteinuria, unspecified: Secondary | ICD-10-CM | POA: Diagnosis not present

## 2023-09-14 ENCOUNTER — Other Ambulatory Visit: Payer: Self-pay

## 2023-09-14 ENCOUNTER — Other Ambulatory Visit (HOSPITAL_COMMUNITY): Payer: Self-pay

## 2023-09-14 MED ORDER — AMLODIPINE BESYLATE 10 MG PO TABS
10.0000 mg | ORAL_TABLET | Freq: Every day | ORAL | 0 refills | Status: DC
  Filled 2023-09-14: qty 30, 30d supply, fill #0

## 2023-09-15 ENCOUNTER — Other Ambulatory Visit (HOSPITAL_COMMUNITY): Payer: Self-pay

## 2023-09-19 ENCOUNTER — Other Ambulatory Visit: Payer: Self-pay

## 2023-10-14 ENCOUNTER — Other Ambulatory Visit (HOSPITAL_COMMUNITY): Payer: Self-pay

## 2023-10-14 ENCOUNTER — Other Ambulatory Visit: Payer: Self-pay | Admitting: Family

## 2023-10-14 DIAGNOSIS — I10 Essential (primary) hypertension: Secondary | ICD-10-CM

## 2023-10-15 ENCOUNTER — Other Ambulatory Visit (HOSPITAL_COMMUNITY): Payer: Self-pay

## 2023-10-16 ENCOUNTER — Other Ambulatory Visit: Payer: Self-pay

## 2023-10-16 ENCOUNTER — Other Ambulatory Visit (HOSPITAL_COMMUNITY): Payer: Self-pay

## 2023-10-16 MED ORDER — HYDRALAZINE HCL 25 MG PO TABS
25.0000 mg | ORAL_TABLET | Freq: Two times a day (BID) | ORAL | 5 refills | Status: DC
  Filled 2023-10-16: qty 60, 30d supply, fill #0
  Filled 2023-11-14: qty 60, 30d supply, fill #1
  Filled 2023-12-13: qty 60, 30d supply, fill #2
  Filled 2024-01-14: qty 60, 30d supply, fill #3
  Filled 2024-02-05 – 2024-02-29 (×2): qty 60, 30d supply, fill #4
  Filled 2024-05-13: qty 60, 30d supply, fill #5

## 2023-10-18 ENCOUNTER — Other Ambulatory Visit (HOSPITAL_COMMUNITY): Payer: Self-pay

## 2023-10-19 ENCOUNTER — Ambulatory Visit: Payer: Medicare (Managed Care) | Admitting: Family

## 2023-10-27 ENCOUNTER — Ambulatory Visit (INDEPENDENT_AMBULATORY_CARE_PROVIDER_SITE_OTHER): Payer: Medicare (Managed Care) | Admitting: Family

## 2023-10-27 ENCOUNTER — Other Ambulatory Visit (HOSPITAL_COMMUNITY): Payer: Self-pay

## 2023-10-27 VITALS — BP 145/77 | HR 70 | Temp 98.4°F | Ht 67.0 in | Wt 268.0 lb

## 2023-10-27 DIAGNOSIS — I10 Essential (primary) hypertension: Secondary | ICD-10-CM

## 2023-10-27 DIAGNOSIS — R7303 Prediabetes: Secondary | ICD-10-CM | POA: Diagnosis not present

## 2023-10-27 DIAGNOSIS — R200 Anesthesia of skin: Secondary | ICD-10-CM | POA: Diagnosis not present

## 2023-10-27 MED ORDER — AMLODIPINE BESYLATE 10 MG PO TABS
10.0000 mg | ORAL_TABLET | Freq: Every day | ORAL | 1 refills | Status: DC
  Filled 2023-10-27: qty 90, 90d supply, fill #0
  Filled 2024-01-14: qty 90, 90d supply, fill #1

## 2023-10-27 MED ORDER — GABAPENTIN 100 MG PO CAPS
100.0000 mg | ORAL_CAPSULE | Freq: Every day | ORAL | 1 refills | Status: DC
  Filled 2023-10-27: qty 60, 20d supply, fill #0
  Filled 2023-12-13: qty 60, 20d supply, fill #1

## 2023-10-27 NOTE — Progress Notes (Unsigned)
 Patient ID: Robert Larson, male    DOB: 07-Mar-1956, 68 y.o.   MRN: [id number]  Chief Complaint  Patient presents with  . Numbness    Pt c/o numbness in bilateral feet while standing and walking over 15 minutes, Present for 3-4 months.   Discussed the use of AI scribe software for clinical note transcription with the patient, who gave verbal consent to proceed.  History of Present Illness Robert Larson is a 68 year old male with chronic kidney disease who presents with neuropathic symptoms in his feet.  He experiences burning and numbness in his feet, especially after standing or walking for more than 15 minutes. The burning sensation at the bottom of his feet is followed by numbness, leading to stumbling. Symptoms improve with rest but worsen with prolonged standing. Burning sensations sometimes extend up the back of his legs but do not originate from the hips.  He has atrial fibrillation and chronic kidney disease. He has no history of coronary artery disease or diabetes, though he was on insulin  during a past hospitalization. He recalls a previous episode of sepsis requiring a line and pressure support.  He is on Ozempic  0.5 mg, which has improved his well-being and contributed to weight loss. Insurance coverage for Ozempic  is an issue due to normal A1c levels, and he is applying for assistance through the manufacturer.  He has a history of left hip replacement and previously used Z coil shoes for hip and knee pain, which are no longer available. He currently wears Skechers for comfort and support.  Assessment & Plan Peripheral neuropathy/Borderline DM- Intermittent burning and numbness in feet, worsened by standing/walking, relieved by rest. Considered age-related nerve degeneration, diabetic neuropathy unlikely. Possible nerve pain from past sepsis. Discussed gabapentin benefits/side effects. - Prescribe gabapentin 100 mg at night, increase to 300 mg if needed. - Refer to podiatry  for full eval, custom insoles, hopefully covered by insurance.  HTN - No recent episodes. Emphasized weight management and cardiovascular health. Current management with Ozempic  for weight loss and cardiovascular benefits. Addressed insurance issues with Ozempic , pursuing manufacturer assistance. - Continue Ozempic  0.5 mg. - Pursue manufacturer assistance for Ozempic .          Subjective:     Outpatient Medications Prior to Visit  Medication Sig Dispense Refill  . amLODipine  (NORVASC ) 10 MG tablet Take 1 tablet (10 mg total) by mouth daily. Patient needs Office visit. 30 tablet 0  . apixaban  (ELIQUIS ) 5 MG TABS tablet Take 1 tablet (5 mg total) by mouth 2 (two) times daily. 60 tablet 5  . furosemide  (LASIX ) 20 MG tablet Take 1 tablet (20 mg total) by mouth daily. 30 tablet 6  . hydrALAZINE  (APRESOLINE ) 25 MG tablet Take 1 tablet (25 mg total) by mouth 2 (two) times daily. 60 tablet 5  . telmisartan  (MICARDIS ) 40 MG tablet Take 1 tablet (40 mg total) by mouth daily. 90 tablet 3  . metoprolol  succinate (TOPROL -XL) 25 MG 24 hr tablet Take 0.5 tablets (12.5 mg total) by mouth at bedtime for 7 days, THEN 1 tablet (25 mg total) at bedtime. 90 tablet 1  . Semaglutide  (OZEMPIC , 0.25 OR 0.5 MG/DOSE, [place])     . Semaglutide -Weight Management (WEGOVY ) 0.25 MG/0.5ML SOAJ Inject 0.25 mg into the skin once a week. Administer weeks 1-4 of therapy 2 mL 0   Facility-Administered Medications Prior to Visit  Medication Dose Route Frequency Provider Last Rate Last Admin  . 0.9 %  sodium chloride  infusion  500 mL Intravenous Once Tobin Forts, MD       Past Medical History:  Diagnosis Date  . Abscess   . Acute blood loss anemia 12/04/2021  . Acute metabolic encephalopathy 12/04/2021  . Acute renal failure superimposed on stage 3a chronic kidney disease (HCC) 11/07/2021  . Acute respiratory failure (HCC) 11/29/2021  . Acute right-sided low back pain with bilateral sciatica 03/13/2017  . Alteration in  electrolyte and fluid balance 11/29/2021  . Atrial fibrillation [facility])    June 2023  . Cigarette nicotine dependence without complication 07/19/2021  . Coffee ground emesis 11/08/2021  . Contusion of left hip and thigh 02/13/2017  . Hypertension   . Hyponatremia 11/07/2021  . Intra-abdominal abscess (HCC) 11/07/2021  . Osteoarthritis   . Pressure injury of skin 11/28/2021  . Protein in urine   . Septic shock (HCC) 11/08/2021  . Sleep apnea   . Tobacco abuse 11/08/2021   Past Surgical History:  Procedure Laterality Date  . ABSCESS DRAINAGE  02/18/2022   continuos drainage  . COLONOSCOPY    . COLONOSCOPY WITH PROPOFOL  N/A 09/01/2022   Procedure: COLONOSCOPY WITH PROPOFOL ;  Surgeon: Tobin Forts, MD;  Location: [facility];  Service: Gastroenterology;  Laterality: N/A;  . CYSTOSCOPY WITH STENT PLACEMENT Bilateral 11/23/2021   Procedure: CYSTOSCOPY WITH STENT PLACEMENT;  Surgeon: Samson Croak, MD;  Location: [facility];  Service: Urology;  Laterality: Bilateral;  . IR PATIENT EVAL TECH 0-60 MINS  02/18/2022  . IR RADIOLOGIST EVAL & MGMT  12/23/2021  . IR RADIOLOGIST EVAL & MGMT  01/12/2022  . IR RADIOLOGIST EVAL & MGMT  03/31/2022  . LAPAROTOMY N/A 11/23/2021   Procedure: EXPLORATORY LAPAROTOMY, PARTIAL COLECTOMY AND COLOSTOMY;  Surgeon: Enid Harry, MD;  Location: [facility];  Service: General;  Laterality: N/A;  . POLYPECTOMY  09/01/2022   Procedure: POLYPECTOMY;  Surgeon: Tobin Forts, MD;  Location: [facility];  Service: Gastroenterology;;  . REPLACEMENT TOTAL HIP W/  RESURFACING IMPLANTS     Left    No Known Allergies    Objective:    Physical Exam Vitals and nursing note reviewed.  Constitutional:      General: He is not in acute distress.    Appearance: Normal appearance.  HENT:     Head: Normocephalic.  Cardiovascular:     Rate and Rhythm: Normal rate and regular rhythm.  Pulmonary:     Effort: Pulmonary effort is normal.     Breath sounds: Normal breath  sounds.  Musculoskeletal:        General: Normal range of motion.     Cervical back: Normal range of motion.  Skin:    General: Skin is warm and dry.  Neurological:     Mental Status: He is alert and oriented to person, place, and time.  Psychiatric:        Mood and Affect: Mood normal.   BP (!) 145/77 (BP Location: Left Arm, Patient Position: Sitting, Cuff Size: Large)   Pulse 70   Temp 98.4 F (36.9 C) (Temporal)   Ht 5\' 7"  (1.702 m)   Wt 268 lb (121.6 kg)   SpO2 94%   BMI 41.97 kg/m  Wt Readings from Last 3 Encounters:  10/27/23 268 lb (121.6 kg)  05/11/23 285 lb 8 oz (129.5 kg)  05/10/23 279 lb (126.6 kg)       Versa Gore, NP

## 2023-10-31 NOTE — Assessment & Plan Note (Addendum)
 No recent episodes. Emphasized weight management and cardiovascular health. Current management Amlodipine  10mg  qd, Metoprolol  25mg  qhs, and Telmisartan  40mg  qd. - Refill Amlodipine  today, continue all meds as directed. - F/U in 6mos

## 2023-11-14 ENCOUNTER — Other Ambulatory Visit: Payer: Self-pay

## 2023-11-14 ENCOUNTER — Other Ambulatory Visit: Payer: Self-pay | Admitting: Family

## 2023-11-14 ENCOUNTER — Other Ambulatory Visit (HOSPITAL_COMMUNITY): Payer: Self-pay

## 2023-11-14 DIAGNOSIS — I10 Essential (primary) hypertension: Secondary | ICD-10-CM

## 2023-11-14 MED ORDER — METOPROLOL SUCCINATE ER 25 MG PO TB24
25.0000 mg | ORAL_TABLET | Freq: Every evening | ORAL | 3 refills | Status: AC
  Filled 2023-11-14: qty 90, 90d supply, fill #0
  Filled 2024-02-05: qty 90, 90d supply, fill #1
  Filled 2024-05-13: qty 90, 90d supply, fill #2

## 2023-11-21 ENCOUNTER — Other Ambulatory Visit (HOSPITAL_COMMUNITY): Payer: Self-pay

## 2023-11-30 ENCOUNTER — Other Ambulatory Visit (HOSPITAL_COMMUNITY): Payer: Self-pay

## 2023-11-30 ENCOUNTER — Other Ambulatory Visit: Payer: Self-pay

## 2023-12-13 ENCOUNTER — Other Ambulatory Visit: Payer: Self-pay

## 2023-12-15 ENCOUNTER — Other Ambulatory Visit (HOSPITAL_COMMUNITY): Payer: Self-pay

## 2023-12-15 ENCOUNTER — Telehealth: Payer: Self-pay | Admitting: Physician Assistant

## 2023-12-15 ENCOUNTER — Other Ambulatory Visit: Payer: Self-pay

## 2023-12-15 DIAGNOSIS — I10 Essential (primary) hypertension: Secondary | ICD-10-CM

## 2023-12-15 MED ORDER — APIXABAN 5 MG PO TABS
5.0000 mg | ORAL_TABLET | Freq: Two times a day (BID) | ORAL | 1 refills | Status: DC
  Filled 2023-12-15 (×3): qty 180, 90d supply, fill #0
  Filled 2024-03-10: qty 180, 90d supply, fill #1

## 2023-12-15 NOTE — Telephone Encounter (Signed)
*  STAT* If patient is at the pharmacy, call can be transferred to refill team.   1. Which medications need to be refilled? (please list name of each medication and dose if known) apixaban  (ELIQUIS ) 5 MG TABS tablet   2. Which pharmacy/location (including street and city if local pharmacy) is medication to be sent to? [facility] Phone: [phone]  Fax: [phone]     3. Do they need a 30 day or 90 day supply? 90 Pt has 2 days left

## 2023-12-16 ENCOUNTER — Other Ambulatory Visit (HOSPITAL_COMMUNITY): Payer: Self-pay

## 2023-12-18 DIAGNOSIS — N1832 Chronic kidney disease, stage 3b: Secondary | ICD-10-CM | POA: Diagnosis not present

## 2024-01-14 ENCOUNTER — Other Ambulatory Visit: Payer: Self-pay | Admitting: Family

## 2024-01-14 DIAGNOSIS — R200 Anesthesia of skin: Secondary | ICD-10-CM

## 2024-01-15 ENCOUNTER — Other Ambulatory Visit (HOSPITAL_COMMUNITY): Payer: Self-pay

## 2024-01-15 ENCOUNTER — Other Ambulatory Visit: Payer: Self-pay

## 2024-01-15 MED ORDER — GABAPENTIN 100 MG PO CAPS
100.0000 mg | ORAL_CAPSULE | Freq: Every day | ORAL | 1 refills | Status: DC
  Filled 2024-01-15: qty 60, 20d supply, fill #0
  Filled 2024-02-05: qty 60, 20d supply, fill #1

## 2024-01-19 DIAGNOSIS — G4733 Obstructive sleep apnea (adult) (pediatric): Secondary | ICD-10-CM | POA: Diagnosis not present

## 2024-02-05 ENCOUNTER — Other Ambulatory Visit: Payer: Self-pay

## 2024-02-05 ENCOUNTER — Other Ambulatory Visit (HOSPITAL_COMMUNITY): Payer: Self-pay

## 2024-02-06 ENCOUNTER — Other Ambulatory Visit (HOSPITAL_COMMUNITY): Payer: Self-pay

## 2024-02-06 ENCOUNTER — Other Ambulatory Visit: Payer: Self-pay

## 2024-02-06 DIAGNOSIS — Z720 Tobacco use: Secondary | ICD-10-CM | POA: Diagnosis not present

## 2024-02-06 DIAGNOSIS — N1832 Chronic kidney disease, stage 3b: Secondary | ICD-10-CM | POA: Diagnosis not present

## 2024-02-06 DIAGNOSIS — L578 Other skin changes due to chronic exposure to nonionizing radiation: Secondary | ICD-10-CM | POA: Diagnosis not present

## 2024-02-06 DIAGNOSIS — G4733 Obstructive sleep apnea (adult) (pediatric): Secondary | ICD-10-CM | POA: Diagnosis not present

## 2024-02-06 DIAGNOSIS — R7303 Prediabetes: Secondary | ICD-10-CM | POA: Diagnosis not present

## 2024-02-06 DIAGNOSIS — N049 Nephrotic syndrome with unspecified morphologic changes: Secondary | ICD-10-CM | POA: Diagnosis not present

## 2024-02-06 DIAGNOSIS — R809 Proteinuria, unspecified: Secondary | ICD-10-CM | POA: Diagnosis not present

## 2024-02-06 DIAGNOSIS — Z6841 Body Mass Index (BMI) 40.0 and over, adult: Secondary | ICD-10-CM | POA: Diagnosis not present

## 2024-02-06 DIAGNOSIS — I10 Essential (primary) hypertension: Secondary | ICD-10-CM | POA: Diagnosis not present

## 2024-02-06 DIAGNOSIS — E7800 Pure hypercholesterolemia, unspecified: Secondary | ICD-10-CM | POA: Diagnosis not present

## 2024-02-06 MED ORDER — TELMISARTAN 40 MG PO TABS
40.0000 mg | ORAL_TABLET | Freq: Every day | ORAL | 3 refills | Status: AC
  Filled 2024-02-06: qty 90, 90d supply, fill #0
  Filled 2024-05-13: qty 90, 90d supply, fill #1

## 2024-02-29 ENCOUNTER — Other Ambulatory Visit: Payer: Self-pay

## 2024-02-29 ENCOUNTER — Other Ambulatory Visit: Payer: Self-pay | Admitting: Family

## 2024-02-29 ENCOUNTER — Other Ambulatory Visit (HOSPITAL_COMMUNITY): Payer: Self-pay

## 2024-02-29 DIAGNOSIS — R200 Anesthesia of skin: Secondary | ICD-10-CM

## 2024-03-01 ENCOUNTER — Other Ambulatory Visit: Payer: Self-pay

## 2024-03-01 ENCOUNTER — Other Ambulatory Visit (HOSPITAL_COMMUNITY): Payer: Self-pay

## 2024-03-01 ENCOUNTER — Telehealth: Payer: Self-pay

## 2024-03-01 MED ORDER — GABAPENTIN 300 MG PO CAPS
300.0000 mg | ORAL_CAPSULE | Freq: Every day | ORAL | 0 refills | Status: DC
  Filled 2024-03-01: qty 90, 90d supply, fill #0

## 2024-03-01 NOTE — Telephone Encounter (Signed)
 I reached out to pt's daughter Daved, in regards to a refill request received for Gabapentin . Rx has been changed to 300 mg capsules to take daily at bedtime per Corean Comment, NP. Marissa verbalized understanding.

## 2024-03-11 ENCOUNTER — Other Ambulatory Visit: Payer: Self-pay

## 2024-03-11 ENCOUNTER — Other Ambulatory Visit (HOSPITAL_COMMUNITY): Payer: Self-pay

## 2024-04-14 NOTE — Progress Notes (Deleted)
 Cardiology Office Note:    Date:  04/14/2024   ID:  Robert Larson, DOB 08-26-1955, MRN [id number]  PCP:  Lucius Krabbe, NP  Cardiologist:  Alvan Ronal BRAVO, MD (Inactive)  Electrophysiologist:  None   Referring MD: Lucius Krabbe, NP   No chief complaint on file. ***  History of Present Illness:    Robert Larson is a 68 y.o. male with a hx of paroxysmal atrial fibrillation, OSA, hypertension, T2DM who presents for follow-up.  Previously followed with Dr. Ronal Alvan.  He is found to have new onset A-fib while hospitalized 11/2021 with diverticulitis complicated by bowel perforation.  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  30-day cardiac monitor 01/2022 showed less than 1% A-fib burden.  Echo 11/2022 showed normal biventricular function, dilated ascending aorta measuring 43 mm.  Since last clinic visit,  Past Medical History:  Diagnosis Date   Abscess    Acute blood loss anemia 12/04/2021   Acute metabolic encephalopathy 12/04/2021   Acute renal failure superimposed on stage 3a chronic kidney disease (HCC) 11/07/2021   Acute respiratory failure (HCC) 11/29/2021   Acute right-sided low back pain with bilateral sciatica 03/13/2017   Alteration in electrolyte and fluid balance 11/29/2021   Atrial fibrillation (HCC)    June 2023   Cigarette nicotine dependence without complication 07/19/2021   Coffee ground emesis 11/08/2021   Contusion of left hip and thigh 02/13/2017   Hypertension    Hyponatremia 11/07/2021   Intra-abdominal abscess (HCC) 11/07/2021   Osteoarthritis    Pressure injury of skin 11/28/2021   Protein in urine    Septic shock (HCC) 11/08/2021   Sleep apnea    Tobacco abuse 11/08/2021    Past Surgical History:  Procedure Laterality Date   ABSCESS DRAINAGE  02/18/2022   continuos drainage   COLONOSCOPY     COLONOSCOPY WITH PROPOFOL  N/A 09/01/2022    Procedure: COLONOSCOPY WITH PROPOFOL ;  Surgeon: Abran Norleen SAILOR, MD;  Location: [facility];  Service: Gastroenterology;  Laterality: N/A;   CYSTOSCOPY WITH STENT PLACEMENT Bilateral 11/23/2021   Procedure: CYSTOSCOPY WITH STENT PLACEMENT;  Surgeon: Carolee Sherwood JONETTA DOUGLAS, MD;  Location: [facility];  Service: Urology;  Laterality: Bilateral;   IR PATIENT EVAL TECH 0-60 MINS  02/18/2022   IR RADIOLOGIST EVAL & MGMT  12/23/2021   IR RADIOLOGIST EVAL & MGMT  01/12/2022   IR RADIOLOGIST EVAL & MGMT  03/31/2022   LAPAROTOMY N/A 11/23/2021   Procedure: EXPLORATORY LAPAROTOMY, PARTIAL COLECTOMY AND COLOSTOMY;  Surgeon: Ebbie Cough, MD;  Location: [facility];  Service: General;  Laterality: N/A;   POLYPECTOMY  09/01/2022   Procedure: POLYPECTOMY;  Surgeon: Abran Norleen SAILOR, MD;  Location: [facility];  Service: Gastroenterology;;   REPLACEMENT TOTAL HIP W/  RESURFACING IMPLANTS     Left     Current Medications: No outpatient medications have been marked as taking for the 04/16/24 encounter (Appointment) with Kate Lonni CROME, MD.   Current Facility-Administered Medications for the 04/16/24 encounter (Appointment) with Kate Lonni CROME, MD  Medication   0.9 %  sodium chloride  infusion     Allergies:   Patient has no known allergies.   Social History   Socioeconomic History   Marital status: Divorced    Spouse name: Not on file   Number of children: 1   Years of education: Not on file   Highest education level: 12th grade  Occupational History   Occupation: engineer, water  Tobacco Use   Smoking status: Former    Current packs/day: 1.00    Types: Cigarettes   Smokeless tobacco: Never  Vaping Use   Vaping status: Never Used  Substance and Sexual Activity   Alcohol use: No   Drug use: Never   Sexual activity: Not on file  Other Topics Concern   Not on file  Social History Narrative   Not on file   Social Drivers of Health   Financial Resource Strain: Low Risk  (10/27/2023)    Overall Financial Resource Strain (CARDIA)    Difficulty of Paying Living Expenses: Not hard at all  Food Insecurity: No Food Insecurity (10/27/2023)   Hunger Vital Sign    Worried About Running Out of Food in the Last Year: Never true    Ran Out of Food in the Last Year: Never true  Transportation Needs: No Transportation Needs (10/27/2023)   PRAPARE - Administrator, Civil Service (Medical): No    Lack of Transportation (Non-Medical): No  Physical Activity: Insufficiently Active (10/27/2023)   Exercise Vital Sign    Days of Exercise per Week: 3 days    Minutes of Exercise per Session: 10 min  Stress: No Stress Concern Present (10/27/2023)   [employer] of Occupational Health - Occupational Stress Questionnaire    Feeling of Stress : Not at all  Social Connections: Moderately Integrated (10/27/2023)   Social Connection and Isolation Panel    Frequency of Communication with Friends and Family: More than three times a week    Frequency of Social Gatherings with Friends and Family: More than three times a week    Attends Religious Services: 1 to 4 times per year    Active Member of [employer] or Organizations: Yes    Attends Banker Meetings: 1 to 4 times per year    Marital Status: Divorced     Family History: The patient's ***family history includes Breast cancer in his mother; Lung cancer in his father. There is no history of Colon cancer or Rectal cancer.  ROS:   Please see the history of present illness.    *** All other systems reviewed and are negative.  EKGs/Labs/Other Studies Reviewed:    The following studies were reviewed today: ***  EKG:  EKG is *** ordered today.  The ekg ordered today demonstrates ***  Recent Labs: 05/11/2023: ALT 17; BUN 19; Creatinine, Ser 1.92; Hemoglobin 15.7; Platelets 194.0; Potassium 4.1; Sodium 135  Recent Lipid Panel    Component Value Date/Time   CHOL 189 08/24/2022 0900   TRIG 293.0 (H) 08/24/2022 0900   HDL  38.40 (L) 08/24/2022 9099   CHOLHDL 5 08/24/2022 0900   VLDL 58.6 (H) 08/24/2022 0900   LDLCALC 41 06/24/2021 0000   LDLDIRECT 104.0 08/24/2022 0900    Physical Exam:    VS:  There were no vitals taken for this visit.    Wt Readings from Last 3 Encounters:  10/27/23 268 lb (121.6 kg)  05/11/23 285 lb 8 oz (129.5 kg)  05/10/23 279 lb (126.6 kg)     GEN: *** Well nourished, well developed in no acute distress HEENT: Normal NECK: No JVD; No carotid bruits LYMPHATICS: No lymphadenopathy CARDIAC: ***RRR, no murmurs, rubs, gallops RESPIRATORY:  Clear to auscultation without rales, wheezing or rhonchi  ABDOMEN: Soft, non-tender, non-distended MUSCULOSKELETAL:  No edema; No deformity  SKIN: Warm and dry NEUROLOGIC:  Alert and oriented x 3 PSYCHIATRIC:  Normal affect   ASSESSMENT:  No diagnosis found. PLAN:    Paroxysmal atrial fibrillation: found to have new onset A-fib while hospitalized 11/2021 with diverticulitis complicated by bowel perforation. 30-day cardiac monitor 01/2022 showed less than 1% A-fib burden.  Echo 11/2022 showed normal biventricular function, dilated ascending aorta measuring 43 mm. - Continue Toprol -XL 25 mg daily - Continue Eliquis  5 mg twice daily  Hypertension: On amlodipine  10 mg daily and Toprol -XL 25 mg daily and telmisartan  40 mg daily  Chronic diastolic heart failure: On Lasix  20 mg daily.  Check BMET***  T2DM: On Ozempic   Dilated aorta: Measured 42 mm in ascending aorta echocardiogram 11/2021.  Repeat echo 11/2022 showed ascending aorta measured 43 mm.  Repeat echocardiogram***  OSA:  RTC in***      Medication Adjustments/Labs and Tests Ordered: Current medicines are reviewed at length with the patient today.  Concerns regarding medicines are outlined above.  No orders of the defined types were placed in this encounter.  No orders of the defined types were placed in this encounter.   There are no Patient Instructions on file for this  visit.   Signed, Lonni LITTIE Nanas, MD  04/14/2024 3:23 PM    [facility]

## 2024-04-16 ENCOUNTER — Ambulatory Visit: Payer: Medicare (Managed Care) | Admitting: Cardiology

## 2024-04-17 DIAGNOSIS — L578 Other skin changes due to chronic exposure to nonionizing radiation: Secondary | ICD-10-CM | POA: Diagnosis not present

## 2024-04-17 DIAGNOSIS — G4733 Obstructive sleep apnea (adult) (pediatric): Secondary | ICD-10-CM | POA: Diagnosis not present

## 2024-04-17 DIAGNOSIS — I10 Essential (primary) hypertension: Secondary | ICD-10-CM | POA: Diagnosis not present

## 2024-04-17 DIAGNOSIS — E7800 Pure hypercholesterolemia, unspecified: Secondary | ICD-10-CM | POA: Diagnosis not present

## 2024-04-17 DIAGNOSIS — R7303 Prediabetes: Secondary | ICD-10-CM | POA: Diagnosis not present

## 2024-04-17 DIAGNOSIS — Z6841 Body Mass Index (BMI) 40.0 and over, adult: Secondary | ICD-10-CM | POA: Diagnosis not present

## 2024-04-17 DIAGNOSIS — N1832 Chronic kidney disease, stage 3b: Secondary | ICD-10-CM | POA: Diagnosis not present

## 2024-04-17 DIAGNOSIS — Z720 Tobacco use: Secondary | ICD-10-CM | POA: Diagnosis not present

## 2024-04-17 DIAGNOSIS — R809 Proteinuria, unspecified: Secondary | ICD-10-CM | POA: Diagnosis not present

## 2024-04-17 DIAGNOSIS — N049 Nephrotic syndrome with unspecified morphologic changes: Secondary | ICD-10-CM | POA: Diagnosis not present

## 2024-05-13 ENCOUNTER — Other Ambulatory Visit: Payer: Self-pay | Admitting: Family

## 2024-05-13 ENCOUNTER — Other Ambulatory Visit (HOSPITAL_COMMUNITY): Payer: Self-pay

## 2024-05-13 DIAGNOSIS — I10 Essential (primary) hypertension: Secondary | ICD-10-CM

## 2024-05-14 ENCOUNTER — Other Ambulatory Visit: Payer: Self-pay

## 2024-05-14 ENCOUNTER — Other Ambulatory Visit (HOSPITAL_COMMUNITY): Payer: Self-pay

## 2024-05-14 MED ORDER — AMLODIPINE BESYLATE 10 MG PO TABS
10.0000 mg | ORAL_TABLET | Freq: Every day | ORAL | 0 refills | Status: DC
  Filled 2024-05-14: qty 30, 30d supply, fill #0

## 2024-05-15 ENCOUNTER — Ambulatory Visit (INDEPENDENT_AMBULATORY_CARE_PROVIDER_SITE_OTHER): Payer: Medicare (Managed Care)

## 2024-05-15 VITALS — BP 130/60 | Ht 68.0 in | Wt 253.0 lb

## 2024-05-15 DIAGNOSIS — Z0000 Encounter for general adult medical examination without abnormal findings: Secondary | ICD-10-CM | POA: Diagnosis not present

## 2024-05-15 NOTE — Patient Instructions (Signed)
 Robert Larson,  Thank you for taking the time for your Medicare Wellness Visit. I appreciate your continued commitment to your health goals. Please review the care plan we discussed, and feel free to reach out if I can assist you further.  Please note that Annual Wellness Visits do not include a physical exam. Some assessments may be limited, especially if the visit was conducted virtually. If needed, we may recommend an in-person follow-up with your provider.  Ongoing Care Seeing your primary care provider every 3 to 6 months helps us  monitor your health and provide consistent, personalized care.   Referrals If a referral was made during today's visit and you haven't received any updates within two weeks, please contact the referred provider directly to check on the status.  Recommended Screenings:  Health Maintenance  Topic Date Due   Zoster (Shingles) Vaccine (2 of 2) 10/21/2011   Hemoglobin A1C  11/09/2023   Flu Shot  01/05/2024   Eye exam for diabetics  04/25/2024   Yearly kidney health urinalysis for diabetes  06/05/2024*   Yearly kidney function blood test for diabetes  09/05/2024   Medicare Annual Wellness Visit  05/15/2025   Colon Cancer Screening  09/01/2027   DTaP/Tdap/Td vaccine (2 - Td or Tdap) 06/06/2029   Hepatitis C Screening  Completed   Meningitis B Vaccine  Aged Out   Pneumococcal Vaccine for age over 70  Discontinued   Complete foot exam   Discontinued   COVID-19 Vaccine  Discontinued  *Topic was postponed. The date shown is not the original due date.       05/15/2024    3:31 PM  Advanced Directives  Does Patient Have a Medical Advance Directive? Yes  Type of Advance Directive Healthcare Power of Attorney  Copy of Healthcare Power of Attorney in Chart? No - copy requested    Vision: Annual vision screenings are recommended for early detection of glaucoma, cataracts, and diabetic retinopathy. These exams can also reveal signs of chronic conditions such as  diabetes and high blood pressure.  Dental: Annual dental screenings help detect early signs of oral cancer, gum disease, and other conditions linked to overall health, including heart disease and diabetes.  Please see the attached documents for additional preventive care recommendations.

## 2024-05-15 NOTE — Progress Notes (Signed)
 Chief Complaint  Patient presents with   Medicare Wellness     Subjective:   Robert Larson is a 68 y.o. male who presents for a Medicare Annual Wellness Visit.  Visit info / Clinical Intake: Medicare Wellness Visit Type:: Subsequent Annual Wellness Visit Persons participating in visit and providing information:: patient Medicare Wellness Visit Mode:: Telephone If telephone:: video declined Since this visit was completed virtually, some vitals may be partially provided or unavailable. Missing vitals are due to the limitations of the virtual format.: Documented vitals are patient reported If Telephone or Video please confirm:: I connected with patient using audio/video enable telemedicine. I verified patient identity with two identifiers, discussed telehealth limitations, and patient agreed to proceed. Patient Location:: home Provider Location:: home office Interpreter Needed?: No Pre-visit prep was completed: yes AWV questionnaire completed by patient prior to visit?: no Living arrangements:: with family/others Patient's Overall Health Status Rating: good Typical amount of pain: none Does pain affect daily life?: no Are you currently prescribed opioids?: no  Dietary Habits and Nutritional Risks How many meals a day?: 2 Eats fruit and vegetables daily?: yes Most meals are obtained by: preparing own meals; eating out In the last 2 weeks, have you had any of the following?: none Diabetic:: (!) yes How often do you check your BS?: 0 Would you like to be referred to a Nutritionist or for Diabetic Management? : no  Functional Status Activities of Daily Living (to include ambulation/medication): Independent Ambulation: Independent with device- listed below Home Assistive Devices/Equipment: Eyeglasses; CPAP Medication Administration: Independent Home Management (perform basic housework or laundry): Independent Manage your own finances?: yes Primary transportation is:  driving Concerns about vision?: no *vision screening is required for WTM* Concerns about hearing?: no  Fall Screening Falls in the past year?: 0 Number of falls in past year: 0 Was there an injury with Fall?: 0 Fall Risk Category Calculator: 0 Patient Fall Risk Level: Low Fall Risk  Fall Risk Patient at Risk for Falls Due to: No Fall Risks Fall risk Follow up: Falls evaluation completed  Home and Transportation Safety: All rugs have non-skid backing?: N/A, no rugs All stairs or steps have railings?: yes Grab bars in the bathtub or shower?: yes Have non-skid surface in bathtub or shower?: yes Good home lighting?: yes Regular seat belt use?: yes Hospital stays in the last year:: no  Cognitive Assessment Difficulty concentrating, remembering, or making decisions? : no Will 6CIT or Mini Cog be Completed: yes What year is it?: 0 points What month is it?: 0 points Give patient an address phrase to remember (5 components): [address] [place]  About what time is it?: 0 points Count backwards from 20 to 1: 0 points Say the months of the year in reverse: 4 points Repeat the address phrase from earlier: 0 points 6 CIT Score: 4 points  Advance Directives (For Healthcare) Does Patient Have a Medical Advance Directive?: Yes Type of Advance Directive: Healthcare Power of Attorney Copy of Healthcare Power of Attorney in Chart?: No - copy requested  Reviewed/Updated  Reviewed/Updated: Reviewed All (Medical, Surgical, Family, Medications, Allergies, Care Teams, Patient Goals)    Allergies (verified) Patient has no known allergies.   Current Medications (verified) Outpatient Encounter Medications as of 05/15/2024  Medication Sig   amLODipine  (NORVASC ) 10 MG tablet Take 1 tablet (10 mg total) by mouth daily. Please schedule a follow up appointment for further refills.   apixaban  (ELIQUIS ) 5 MG TABS tablet Take 1 tablet (5 mg total)  by mouth 2 (two) times daily.   furosemide   (LASIX ) 20 MG tablet Take 1 tablet (20 mg total) by mouth daily.   gabapentin  (NEURONTIN ) 100 MG capsule Take 1-3 capsules (100-300 mg total) by mouth at bedtime. Start with 1 capsule nightly, if no improvement in foot numbness after 1 week, increase to 2 capsules for 1 week, then 3 capsules if needed.   gabapentin  (NEURONTIN ) 300 MG capsule Take 1 capsule (300 mg total) by mouth at bedtime. 1 capsule(300 MG) by mouth nightly, Please schedule an office visit for further refills.   hydrALAZINE  (APRESOLINE ) 25 MG tablet Take 1 tablet (25 mg total) by mouth 2 (two) times daily.   metoprolol  succinate (TOPROL -XL) 25 MG 24 hr tablet Take 1 tablet (25 mg total) by mouth at bedtime.   Semaglutide  (OZEMPIC , 0.25 OR 0.5 MG/DOSE, [place])    telmisartan  (MICARDIS ) 40 MG tablet Take 1 tablet (40 mg total) by mouth daily. (Patient not taking: Reported on 05/15/2024)   Facility-Administered Encounter Medications as of 05/15/2024  Medication   0.9 %  sodium chloride  infusion    History: Past Medical History:  Diagnosis Date   Abscess    Acute blood loss anemia 12/04/2021   Acute metabolic encephalopathy 12/04/2021   Acute renal failure superimposed on stage 3a chronic kidney disease (HCC) 11/07/2021   Acute respiratory failure (HCC) 11/29/2021   Acute right-sided low back pain with bilateral sciatica 03/13/2017   Alteration in electrolyte and fluid balance 11/29/2021   Atrial fibrillation (HCC)    June 2023   Cigarette nicotine dependence without complication 07/19/2021   Coffee ground emesis 11/08/2021   Contusion of left hip and thigh 02/13/2017   Hypertension    Hyponatremia 11/07/2021   Intra-abdominal abscess (HCC) 11/07/2021   Osteoarthritis    Pressure injury of skin 11/28/2021   Protein in urine    Septic shock (HCC) 11/08/2021   Sleep apnea    Tobacco abuse 11/08/2021   Past Surgical History:  Procedure Laterality Date   ABSCESS DRAINAGE  02/18/2022   continuos drainage   COLONOSCOPY      COLONOSCOPY WITH PROPOFOL  N/A 09/01/2022   Procedure: COLONOSCOPY WITH PROPOFOL ;  Surgeon: Abran Norleen SAILOR, MD;  Location: [facility];  Service: Gastroenterology;  Laterality: N/A;   CYSTOSCOPY WITH STENT PLACEMENT Bilateral 11/23/2021   Procedure: CYSTOSCOPY WITH STENT PLACEMENT;  Surgeon: Carolee Sherwood JONETTA DOUGLAS, MD;  Location: [facility];  Service: Urology;  Laterality: Bilateral;   IR PATIENT EVAL TECH 0-60 MINS  02/18/2022   IR RADIOLOGIST EVAL & MGMT  12/23/2021   IR RADIOLOGIST EVAL & MGMT  01/12/2022   IR RADIOLOGIST EVAL & MGMT  03/31/2022   LAPAROTOMY N/A 11/23/2021   Procedure: EXPLORATORY LAPAROTOMY, PARTIAL COLECTOMY AND COLOSTOMY;  Surgeon: Ebbie Cough, MD;  Location: [facility];  Service: General;  Laterality: N/A;   POLYPECTOMY  09/01/2022   Procedure: POLYPECTOMY;  Surgeon: Abran Norleen SAILOR, MD;  Location: [facility];  Service: Gastroenterology;;   REPLACEMENT TOTAL HIP W/  RESURFACING IMPLANTS     Left    Family History  Problem Relation Age of Onset   Breast cancer Mother    Lung cancer Father    Colon cancer Neg Hx    Rectal cancer Neg Hx    Social History   Occupational History   Occupation: engineer, water  Tobacco Use   Smoking status: Former    Current packs/day: 1.00    Types: Cigarettes   Smokeless tobacco: Never  Vaping Use  Vaping status: Never Used  Substance and Sexual Activity   Alcohol use: No   Drug use: Never   Sexual activity: Not on file   Tobacco Counseling Counseling given: Not Answered  SDOH Screenings   Food Insecurity: No Food Insecurity (05/15/2024)  Housing: Unknown (05/15/2024)  Transportation Needs: No Transportation Needs (05/15/2024)  Utilities: Not At Risk (05/15/2024)  Depression (PHQ2-9): Low Risk  (05/15/2024)  Financial Resource Strain: Low Risk  (10/27/2023)  Physical Activity: Sufficiently Active (05/15/2024)  Social Connections: Moderately Integrated (05/15/2024)  Stress: No Stress Concern Present (05/15/2024)  Tobacco  Use: Medium Risk (05/15/2024)  Health Literacy: Adequate Health Literacy (05/15/2024)   See flowsheets for full screening details  Depression Screen PHQ 2 & 9 Depression Scale- Over the past 2 weeks, how often have you been bothered by any of the following problems? Little interest or pleasure in doing things: 0 Feeling down, depressed, or hopeless (PHQ Adolescent also includes...irritable): 0 PHQ-2 Total Score: 0     Goals Addressed               This Visit's Progress     weight loss journey (pt-stated)        Weight loss journey              Objective:    Today's Vitals   05/15/24 1529  BP: 130/60  Weight: 253 lb (114.8 kg)  Height: 5' 8 (1.727 m)   Body mass index is 38.47 kg/m.  Hearing/Vision screen Hearing Screening - Comments:: Pt denies any hearing issues  Vision Screening - Comments:: Wears rx glasses - up to date with routine eye exams with [facility]  Immunizations and Health Maintenance Health Maintenance  Topic Date Due   Zoster Vaccines- Shingrix (2 of 2) 10/21/2011   HEMOGLOBIN A1C  11/09/2023   OPHTHALMOLOGY EXAM  04/25/2024   Diabetic kidney evaluation - Urine ACR  06/05/2024 (Originally 06/24/2022)   Influenza Vaccine  09/03/2024 (Originally 01/05/2024)   Diabetic kidney evaluation - eGFR measurement  09/05/2024   Medicare Annual Wellness (AWV)  05/15/2025   Colonoscopy  09/01/2027   DTaP/Tdap/Td (2 - Td or Tdap) 06/06/2029   Hepatitis C Screening  Completed   Meningococcal B Vaccine  Aged Out   Pneumococcal Vaccine: 50+ Years  Discontinued   FOOT EXAM  Discontinued   COVID-19 Vaccine  Discontinued        Assessment/Plan:  This is a routine wellness examination for Bj's.  Patient Care Team: Lucius Krabbe, NP as PCP - General (Family Medicine) Alvan Ronal BRAVO, MD (Inactive) as PCP - Cardiology (Cardiology) Abran Norleen SAILOR, MD as Consulting Physician (Gastroenterology) Addie Cordella Hamilton, MD as Consulting Physician (Orthopedic  Surgery) Kennyth Cy RAMAN, DO (Osteopathic Medicine)  I have personally reviewed and noted the following in the patients chart:   Medical and social history Use of alcohol, tobacco or illicit drugs  Current medications and supplements including opioid prescriptions. Functional ability and status Nutritional status Physical activity Advanced directives List of other physicians Hospitalizations, surgeries, and ER visits in previous 12 months Vitals Screenings to include cognitive, depression, and falls Referrals and appointments  No orders of the defined types were placed in this encounter.  In addition, I have reviewed and discussed with patient certain preventive protocols, quality metrics, and best practice recommendations. A written personalized care plan for preventive services as well as general preventive health recommendations were provided to patient.   Ellouise VEAR Haws, LPN   [id number]   Return in 1 year (  on 05/19/2025).  After Visit Summary: (MyChart) Due to this being a telephonic visit, the after visit summary with patients personalized plan was offered to patient via MyChart   Nurse Notes: No voiced or noted concerns at this time

## 2024-05-20 ENCOUNTER — Telehealth: Payer: Self-pay | Admitting: Family

## 2024-05-20 NOTE — Telephone Encounter (Signed)
 Left patient vm to call office back to schedule CPE with [facility] before EOY.  Patient can be transferred through to office to get scheduled as this would need an overridge.

## 2024-06-03 ENCOUNTER — Other Ambulatory Visit: Payer: Self-pay | Admitting: Family

## 2024-06-03 ENCOUNTER — Other Ambulatory Visit (HOSPITAL_COMMUNITY): Payer: Self-pay

## 2024-06-04 ENCOUNTER — Other Ambulatory Visit (HOSPITAL_COMMUNITY): Payer: Self-pay

## 2024-06-04 ENCOUNTER — Other Ambulatory Visit: Payer: Self-pay

## 2024-06-04 MED ORDER — FUROSEMIDE 20 MG PO TABS
20.0000 mg | ORAL_TABLET | Freq: Every day | ORAL | 5 refills | Status: AC
  Filled 2024-06-04: qty 30, 30d supply, fill #0

## 2024-06-05 ENCOUNTER — Other Ambulatory Visit (HOSPITAL_COMMUNITY): Payer: Self-pay

## 2024-06-05 MED ORDER — GABAPENTIN 300 MG PO CAPS
300.0000 mg | ORAL_CAPSULE | Freq: Every day | ORAL | 0 refills | Status: AC
  Filled 2024-06-05: qty 30, 30d supply, fill #0

## 2024-06-16 ENCOUNTER — Other Ambulatory Visit (HOSPITAL_COMMUNITY): Payer: Self-pay

## 2024-06-16 ENCOUNTER — Other Ambulatory Visit: Payer: Self-pay | Admitting: Family

## 2024-06-16 ENCOUNTER — Other Ambulatory Visit: Payer: Self-pay | Admitting: Cardiology

## 2024-06-16 DIAGNOSIS — I10 Essential (primary) hypertension: Secondary | ICD-10-CM

## 2024-06-17 ENCOUNTER — Other Ambulatory Visit: Payer: Self-pay

## 2024-06-17 ENCOUNTER — Other Ambulatory Visit (HOSPITAL_COMMUNITY): Payer: Self-pay

## 2024-06-17 MED ORDER — HYDRALAZINE HCL 25 MG PO TABS
25.0000 mg | ORAL_TABLET | Freq: Two times a day (BID) | ORAL | 5 refills | Status: AC
  Filled 2024-06-17: qty 60, 30d supply, fill #0

## 2024-06-17 MED ORDER — APIXABAN 5 MG PO TABS
5.0000 mg | ORAL_TABLET | Freq: Two times a day (BID) | ORAL | 1 refills | Status: AC
  Filled 2024-06-17: qty 180, 90d supply, fill #0
  Filled 2024-06-26: qty 60, 30d supply, fill #0

## 2024-06-17 MED ORDER — AMLODIPINE BESYLATE 10 MG PO TABS
10.0000 mg | ORAL_TABLET | Freq: Every day | ORAL | 0 refills | Status: AC
  Filled 2024-06-17: qty 30, 30d supply, fill #0

## 2024-06-17 NOTE — Telephone Encounter (Signed)
 Prescription refill request for Eliquis  received. Indication:AFIB  Last office visit:upcoming Scr: 1.90  4/25 Age:69 Weight:114.8  kg  Prescription refilled

## 2024-06-18 ENCOUNTER — Other Ambulatory Visit: Payer: Self-pay

## 2024-06-24 NOTE — Progress Notes (Unsigned)
 " Cardiology Office Note:    Date:  06/27/2024   ID:  Robert Larson, DOB 08/09/1955, MRN [id number]  PCP:  Lucius Krabbe, NP  Cardiologist:  Alvan Ronal BRAVO, MD (Inactive)  Electrophysiologist:  None   Referring MD: Lucius Krabbe, NP   Chief Complaint  Patient presents with   Atrial Fibrillation    History of Present Illness:    Robert Larson is a 69 y.o. male with a hx of paroxysmal atrial fibrillation, OSA, hypertension, T2DM who presents for follow-up.  Previously followed with Dr. Ronal Alvan.  He is found to have new onset A-fib while hospitalized 11/2021 with diverticulitis complicated by bowel perforation.   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  30-day cardiac monitor 01/2022 showed less than 1% A-fib burden.  Echo 11/2022 showed normal biventricular function, dilated ascending aorta measuring 43 mm.   Since last clinic visit, he reports he is doing well.  Denies any chest pain, dyspnea, lightheadedness, syncope, or palpitations.  Takes Lasix  daily, has been keeping lower extremity edema under good control.  He is taking Eliquis , denies any bleeding issues.    Wt Readings from Last 3 Encounters:  06/27/24 268 lb 6.4 oz (121.7 kg)  05/15/24 253 lb (114.8 kg)  10/27/23 268 lb (121.6 kg)       Past Medical History:  Diagnosis Date   Abscess    Acute blood loss anemia 12/04/2021   Acute metabolic encephalopathy 12/04/2021   Acute renal failure superimposed on stage 3a chronic kidney disease (HCC) 11/07/2021   Acute respiratory failure (HCC) 11/29/2021   Acute right-sided low back pain with bilateral sciatica 03/13/2017   Alteration in electrolyte and fluid balance 11/29/2021   Atrial fibrillation (HCC)    June 2023   Cigarette nicotine dependence without complication 07/19/2021   Coffee ground emesis 11/08/2021   Contusion of left hip and thigh 02/13/2017    Hypertension    Hyponatremia 11/07/2021   Intra-abdominal abscess (HCC) 11/07/2021   Osteoarthritis    Pressure injury of skin 11/28/2021   Protein in urine    Septic shock (HCC) 11/08/2021   Sleep apnea    Tobacco abuse 11/08/2021    Past Surgical History:  Procedure Laterality Date   ABSCESS DRAINAGE  02/18/2022   continuos drainage   COLONOSCOPY     COLONOSCOPY WITH PROPOFOL  N/A 09/01/2022   Procedure: COLONOSCOPY WITH PROPOFOL ;  Surgeon: Abran Norleen SAILOR, MD;  Location: [facility];  Service: Gastroenterology;  Laterality: N/A;   CYSTOSCOPY WITH STENT PLACEMENT Bilateral 11/23/2021   Procedure: CYSTOSCOPY WITH STENT PLACEMENT;  Surgeon: Carolee Sherwood JONETTA DOUGLAS, MD;  Location: [facility];  Service: Urology;  Laterality: Bilateral;   IR PATIENT EVAL TECH 0-60 MINS  02/18/2022   IR RADIOLOGIST EVAL & MGMT  12/23/2021   IR RADIOLOGIST EVAL & MGMT  01/12/2022   IR RADIOLOGIST EVAL & MGMT  03/31/2022   LAPAROTOMY N/A 11/23/2021   Procedure: EXPLORATORY LAPAROTOMY, PARTIAL COLECTOMY AND COLOSTOMY;  Surgeon: Ebbie Cough, MD;  Location: [facility];  Service: General;  Laterality: N/A;   POLYPECTOMY  09/01/2022   Procedure: POLYPECTOMY;  Surgeon: Abran Norleen SAILOR, MD;  Location: [facility];  Service: Gastroenterology;;   REPLACEMENT TOTAL HIP W/  RESURFACING IMPLANTS     Left     Current Medications: Active Medications[1]   Allergies:   Patient has no known allergies.   Social History   Socioeconomic History   Marital status: Divorced  Spouse name: Not on file   Number of children: 1   Years of education: Not on file   Highest education level: 12th grade  Occupational History   Occupation: body shop owner  Tobacco Use   Smoking status: Former    Current packs/day: 1.00    Types: Cigarettes   Smokeless tobacco: Never  Vaping Use   Vaping status: Never Used  Substance and Sexual Activity   Alcohol use: No   Drug use: Never   Sexual activity: Not on file  Other Topics Concern   Not  on file  Social History Narrative   Not on file   Social Drivers of Health   Tobacco Use: Medium Risk (05/15/2024)   Patient History    Smoking Tobacco Use: Former    Smokeless Tobacco Use: Never    Passive Exposure: Not on Actuary Strain: Low Risk (10/27/2023)   Overall Financial Resource Strain (CARDIA)    Difficulty of Paying Living Expenses: Not hard at all  Food Insecurity: No Food Insecurity (05/15/2024)   Epic    Worried About Programme Researcher, Broadcasting/film/video in the Last Year: Never true    Ran Out of Food in the Last Year: Never true  Transportation Needs: No Transportation Needs (05/15/2024)   Epic    Lack of Transportation (Medical): No    Lack of Transportation (Non-Medical): No  Physical Activity: Sufficiently Active (05/15/2024)   Exercise Vital Sign    Days of Exercise per Week: 5 days    Minutes of Exercise per Session: 30 min  Stress: No Stress Concern Present (05/15/2024)   [employer] of Occupational Health - Occupational Stress Questionnaire    Feeling of Stress: Not at all  Social Connections: Moderately Integrated (05/15/2024)   Social Connection and Isolation Panel    Frequency of Communication with Friends and Family: More than three times a week    Frequency of Social Gatherings with Friends and Family: More than three times a week    Attends Religious Services: More than 4 times per year    Active Member of [employer] or Organizations: Yes    Attends Banker Meetings: 1 to 4 times per year    Marital Status: Divorced  Depression (PHQ2-9): Low Risk (05/15/2024)   Depression (PHQ2-9)    PHQ-2 Score: 0  Alcohol Screen: Not on file  Housing: Unknown (05/15/2024)   Epic    Unable to Pay for Housing in the Last Year: No    Number of Times Moved in the Last Year: Not on file    Homeless in the Last Year: No  Utilities: Not At Risk (05/15/2024)   Epic    Threatened with loss of utilities: No  Health Literacy: Adequate Health Literacy  (05/15/2024)   B1300 Health Literacy    Frequency of need for help with medical instructions: Never     Family History: The patient's family history includes Breast cancer in his mother; Lung cancer in his father. There is no history of Colon cancer or Rectal cancer.  ROS:   Please see the history of present illness.     All other systems reviewed and are negative.  EKGs/Labs/Other Studies Reviewed:    The following studies were reviewed today:   EKG:   06/27/2024: Normal sinus rhythm, rate 63, right bundle branch block  Recent Labs: No results found for requested labs within last 365 days.  Recent Lipid Panel    Component Value Date/Time   CHOL 189  08/24/2022 0900   TRIG 293.0 (H) 08/24/2022 0900   HDL 38.40 (L) 08/24/2022 0900   CHOLHDL 5 08/24/2022 0900   VLDL 58.6 (H) 08/24/2022 0900   LDLCALC 41 06/24/2021 0000   LDLDIRECT 104.0 08/24/2022 0900    Physical Exam:    VS:  BP 124/60 (BP Location: Left Arm, Patient Position: Sitting, Cuff Size: Normal)   Pulse 66   Wt 268 lb 6.4 oz (121.7 kg)   SpO2 93%   BMI 40.81 kg/m     Wt Readings from Last 3 Encounters:  06/27/24 268 lb 6.4 oz (121.7 kg)  05/15/24 253 lb (114.8 kg)  10/27/23 268 lb (121.6 kg)     GEN:  Well nourished, well developed in no acute distress HEENT: Normal NECK: No JVD; No carotid bruits LYMPHATICS: No lymphadenopathy CARDIAC: RRR, no murmurs, rubs, gallops RESPIRATORY:  Clear to auscultation without rales, wheezing or rhonchi  ABDOMEN: Soft, non-tender, non-distended MUSCULOSKELETAL:  No edema; No deformity  SKIN: Warm and dry NEUROLOGIC:  Alert and oriented x 3 PSYCHIATRIC:  Normal affect   ASSESSMENT:    1. Paroxysmal atrial fibrillation (HCC)   2. Hyperlipidemia, unspecified hyperlipidemia type   3. Aortic dilatation   4. Essential hypertension   5. Chronic diastolic heart failure (HCC)   6. Stage 3b chronic kidney disease (HCC)    PLAN:    Paroxysmal atrial fibrillation:  found to have new onset A-fib while hospitalized 11/2021 with diverticulitis complicated by bowel perforation. 30-day cardiac monitor 01/2022 showed less than 1% A-fib burden.  Echo 11/2022 showed normal biventricular function, dilated ascending aorta measuring 43 mm. - Continue Toprol -XL 25 mg daily - Continue Eliquis  5 mg twice daily   Hypertension: On amlodipine  10 mg daily and Toprol -XL 25 mg daily and telmisartan  40 mg daily.  Appears controlled   Chronic diastolic heart failure: On Lasix  20 mg daily.  Appears euvolemic.  Reports having labs drawn with nephrology next week   Obesity: Body mass index is 40.81 kg/m. On Ozempic   Hyperlipidemia: LDL 90 on 09/2023.  Recommend calcium  score for risk stratification   Dilated aorta: Measured 42 mm in ascending aorta echocardiogram 11/2021.  Repeat echo 11/2022 showed ascending aorta measured 43 mm.  Follow-up aortic dilatation on calcium  score as above   OSA: on CPAP, reports compliance  CKD stage IIIb: Creatinine 1.02/2024.  Follows with nephrology   RTC in 6 months   Medication Adjustments/Labs and Tests Ordered: Current medicines are reviewed at length with the patient today.  Concerns regarding medicines are outlined above.  Orders Placed This Encounter  Procedures   CT CARDIAC SCORING (SELF PAY ONLY)   EKG 12-Lead   No orders of the defined types were placed in this encounter.   Patient Instructions  Medication Instructions:  Your physician recommends that you continue on your current medications as directed. Please refer to the Current Medication list given to you today.  *If you need a refill on your cardiac medications before your next appointment, please call your pharmacy*  Lab Work: none If you have labs (blood work) drawn today and your tests are completely normal, you will receive your results only by: MyChart Message (if you have MyChart) OR A paper copy in the mail If you have any lab test that is abnormal or we need to  change your treatment, we will call you to review the results.  Testing/Procedures: Dr. Kate  has ordered a CT coronary calcium  score.   Test locations:  [facility]  at [facility]  [facility] at [facility]  This is $99 out of pocket.   Coronary CalciumScan A coronary calcium  scan is an imaging test used to look for deposits of calcium  and other fatty materials (plaques) in the inner lining of the blood vessels of the heart (coronary arteries). These deposits of calcium  and plaques can partly clog and narrow the coronary arteries without producing any symptoms or warning signs. This puts a person at risk for a heart attack. This test can detect these deposits before symptoms develop. Tell a health care provider about: Any allergies you have. All medicines you are taking, including vitamins, herbs, eye drops, creams, and over-the-counter medicines. Any problems you or family members have had with anesthetic medicines. Any blood disorders you have. Any surgeries you have had. Any medical conditions you have. Whether you are pregnant or may be pregnant. What are the risks? Generally, this is a safe procedure. However, problems may occur, including: Harm to a pregnant woman and her unborn baby. This test involves the use of radiation. Radiation exposure can be dangerous to a pregnant woman and her unborn baby. If you are pregnant, you generally should not have this procedure done. Slight increase in the risk of cancer. This is because of the radiation involved in the test. What happens before the procedure? No preparation is needed for this procedure. What happens during the procedure? You will undress and remove any jewelry around your neck or chest. You will put on a hospital gown. Sticky electrodes will be placed on your chest. The electrodes will be connected to an  electrocardiogram (ECG) machine to record a tracing of the electrical activity of your heart. A CT scanner will take pictures of your heart. During this time, you will be asked to lie still and hold your breath for 2-3 seconds while a picture of your heart is being taken. The procedure may vary among health care providers and hospitals. What happens after the procedure? You can get dressed. You can return to your normal activities. It is up to you to get the results of your test. Ask your health care provider, or the department that is doing the test, when your results will be ready. Summary A coronary calcium  scan is an imaging test used to look for deposits of calcium  and other fatty materials (plaques) in the inner lining of the blood vessels of the heart (coronary arteries). Generally, this is a safe procedure. Tell your health care provider if you are pregnant or may be pregnant. No preparation is needed for this procedure. A CT scanner will take pictures of your heart. You can return to your normal activities after the scan is done. This information is not intended to replace advice given to you by your health care provider. Make sure you discuss any questions you have with your health care provider. Document Released: 11/19/2007 Document Revised: 04/11/2016 Document Reviewed: 04/11/2016 Elsevier Interactive Patient Education  2017 [employer].   Follow-Up: At [facility], you and your health needs are our priority.  As part of our continuing mission to provide you with exceptional heart care, our providers are all part of one team.  This team includes your primary Cardiologist (physician) and Advanced Practice Providers or APPs (Physician Assistants and Nurse Practitioners) who all work together to provide you with the care you need, when you need it.  Your next appointment:   6 months  Provider:   Dr. Kate   We recommend signing up for the patient portal called  MyChart.  Sign up information is provided on this After Visit Summary.  MyChart is used to connect with patients for Virtual Visits (Telemedicine).  Patients are able to view lab/test results, encounter notes, upcoming appointments, etc.  Non-urgent messages can be sent to your provider as well.   To learn more about what you can do with MyChart, go to [email or web address].   Other Instructions none            Signed, Lonni LITTIE Kate, MD  06/27/2024 9:32 AM    [facility]     [1]  Current Meds  Medication Sig   amLODipine  (NORVASC ) 10 MG tablet Take 1 tablet (10 mg total) by mouth daily. Please schedule a follow up appointment for further refills.   apixaban  (ELIQUIS ) 5 MG TABS tablet Take 1 tablet (5 mg total) by mouth 2 (two) times daily.   furosemide  (LASIX ) 20 MG tablet Take 1 tablet (20 mg total) by mouth daily.   gabapentin  (NEURONTIN ) 300 MG capsule Take 1 capsule (300 mg total) by mouth at bedtime. 1 capsule(300 MG) by mouth nightly, Please schedule an office visit for further refills.   hydrALAZINE  (APRESOLINE ) 25 MG tablet Take 1 tablet (25 mg total) by mouth 2 (two) times daily.   metoprolol  succinate (TOPROL -XL) 25 MG 24 hr tablet Take 1 tablet (25 mg total) by mouth at bedtime.   Semaglutide  (OZEMPIC , 0.25 OR 0.5 MG/DOSE, [place])    telmisartan  (MICARDIS ) 40 MG tablet Take 1 tablet (40 mg total) by mouth daily.   Current Facility-Administered Medications for the 06/27/24 encounter (Office Visit) with Kate Lonni LITTIE, MD  Medication   0.9 %  sodium chloride  infusion   "

## 2024-06-26 ENCOUNTER — Other Ambulatory Visit (HOSPITAL_COMMUNITY): Payer: Self-pay

## 2024-06-27 ENCOUNTER — Ambulatory Visit: Payer: Medicare (Managed Care) | Admitting: Cardiology

## 2024-06-27 VITALS — BP 124/60 | HR 66 | Wt 268.4 lb

## 2024-06-27 DIAGNOSIS — I77819 Aortic ectasia, unspecified site: Secondary | ICD-10-CM | POA: Diagnosis not present

## 2024-06-27 DIAGNOSIS — I10 Essential (primary) hypertension: Secondary | ICD-10-CM

## 2024-06-27 DIAGNOSIS — E785 Hyperlipidemia, unspecified: Secondary | ICD-10-CM | POA: Diagnosis not present

## 2024-06-27 DIAGNOSIS — I5032 Chronic diastolic (congestive) heart failure: Secondary | ICD-10-CM

## 2024-06-27 DIAGNOSIS — I480 Paroxysmal atrial fibrillation: Secondary | ICD-10-CM

## 2024-06-27 DIAGNOSIS — N1832 Chronic kidney disease, stage 3b: Secondary | ICD-10-CM

## 2024-06-27 NOTE — Patient Instructions (Addendum)
 Medication Instructions:  Your physician recommends that you continue on your current medications as directed. Please refer to the Current Medication list given to you today.  *If you need a refill on your cardiac medications before your next appointment, please call your pharmacy*  Lab Work: none If you have labs (blood work) drawn today and your tests are completely normal, you will receive your results only by: MyChart Message (if you have MyChart) OR A paper copy in the mail If you have any lab test that is abnormal or we need to change your treatment, we will call you to review the results.  Testing/Procedures: Dr. Kate  has ordered a CT coronary calcium  score.   Test locations:  [facility] at [facility]  [facility] at [facility]  This is $99 out of pocket.   Coronary CalciumScan A coronary calcium  scan is an imaging test used to look for deposits of calcium  and other fatty materials (plaques) in the inner lining of the blood vessels of the heart (coronary arteries). These deposits of calcium  and plaques can partly clog and narrow the coronary arteries without producing any symptoms or warning signs. This puts a person at risk for a heart attack. This test can detect these deposits before symptoms develop. Tell a health care provider about: Any allergies you have. All medicines you are taking, including vitamins, herbs, eye drops, creams, and over-the-counter medicines. Any problems you or family members have had with anesthetic medicines. Any blood disorders you have. Any surgeries you have had. Any medical conditions you have. Whether you are pregnant or may be pregnant. What are the risks? Generally, this is a safe procedure. However, problems may occur, including: Harm to a pregnant woman and her unborn baby. This test involves the use of radiation. Radiation  exposure can be dangerous to a pregnant woman and her unborn baby. If you are pregnant, you generally should not have this procedure done. Slight increase in the risk of cancer. This is because of the radiation involved in the test. What happens before the procedure? No preparation is needed for this procedure. What happens during the procedure? You will undress and remove any jewelry around your neck or chest. You will put on a hospital gown. Sticky electrodes will be placed on your chest. The electrodes will be connected to an electrocardiogram (ECG) machine to record a tracing of the electrical activity of your heart. A CT scanner will take pictures of your heart. During this time, you will be asked to lie still and hold your breath for 2-3 seconds while a picture of your heart is being taken. The procedure may vary among health care providers and hospitals. What happens after the procedure? You can get dressed. You can return to your normal activities. It is up to you to get the results of your test. Ask your health care provider, or the department that is doing the test, when your results will be ready. Summary A coronary calcium  scan is an imaging test used to look for deposits of calcium  and other fatty materials (plaques) in the inner lining of the blood vessels of the heart (coronary arteries). Generally, this is a safe procedure. Tell your health care provider if you are pregnant or may be pregnant. No preparation is needed for this procedure. A CT scanner will take pictures of your heart. You can return to your normal activities after the scan is done.  This information is not intended to replace advice given to you by your health care provider. Make sure you discuss any questions you have with your health care provider. Document Released: 11/19/2007 Document Revised: 04/11/2016 Document Reviewed: 04/11/2016 Elsevier Interactive Patient Education  2017 [employer].    Follow-Up: At [facility], you and your health needs are our priority.  As part of our continuing mission to provide you with exceptional heart care, our providers are all part of one team.  This team includes your primary Cardiologist (physician) and Advanced Practice Providers or APPs (Physician Assistants and Nurse Practitioners) who all work together to provide you with the care you need, when you need it.  Your next appointment:   6 months  Provider:   Dr. Kate   We recommend signing up for the patient portal called MyChart.  Sign up information is provided on this After Visit Summary.  MyChart is used to connect with patients for Virtual Visits (Telemedicine).  Patients are able to view lab/test results, encounter notes, upcoming appointments, etc.  Non-urgent messages can be sent to your provider as well.   To learn more about what you can do with MyChart, go to [email or web address].   Other Instructions none

## 2024-07-02 ENCOUNTER — Ambulatory Visit (HOSPITAL_COMMUNITY): Payer: Medicare (Managed Care)
